# Patient Record
Sex: Male | Born: 1970 | Race: Black or African American | Hispanic: No | Smoking: Former smoker
Health system: Southern US, Community
[De-identification: ages and names within clinical notes are randomized; demographics above are authoritative.]

## PROBLEM LIST (undated history)

## (undated) DIAGNOSIS — I639 Cerebral infarction, unspecified: Secondary | ICD-10-CM

## (undated) DIAGNOSIS — R519 Headache, unspecified: Secondary | ICD-10-CM

## (undated) DIAGNOSIS — I351 Nonrheumatic aortic (valve) insufficiency: Secondary | ICD-10-CM

## (undated) DIAGNOSIS — F419 Anxiety disorder, unspecified: Secondary | ICD-10-CM

## (undated) DIAGNOSIS — I2510 Atherosclerotic heart disease of native coronary artery without angina pectoris: Secondary | ICD-10-CM

## (undated) DIAGNOSIS — F32A Depression, unspecified: Secondary | ICD-10-CM

## (undated) DIAGNOSIS — K219 Gastro-esophageal reflux disease without esophagitis: Secondary | ICD-10-CM

## (undated) DIAGNOSIS — F329 Major depressive disorder, single episode, unspecified: Secondary | ICD-10-CM

## (undated) DIAGNOSIS — Z720 Tobacco use: Secondary | ICD-10-CM

## (undated) DIAGNOSIS — R7303 Prediabetes: Secondary | ICD-10-CM

## (undated) DIAGNOSIS — I219 Acute myocardial infarction, unspecified: Secondary | ICD-10-CM

## (undated) DIAGNOSIS — Z01810 Encounter for preprocedural cardiovascular examination: Secondary | ICD-10-CM

## (undated) DIAGNOSIS — I10 Essential (primary) hypertension: Secondary | ICD-10-CM

## (undated) DIAGNOSIS — R011 Cardiac murmur, unspecified: Secondary | ICD-10-CM

## (undated) HISTORY — DX: Atherosclerotic heart disease of native coronary artery without angina pectoris: I25.10

## (undated) HISTORY — DX: Nonrheumatic aortic (valve) insufficiency: I35.1

## (undated) HISTORY — DX: Encounter for preprocedural cardiovascular examination: Z01.810

---

## 2000-09-29 ENCOUNTER — Encounter: Payer: Self-pay | Admitting: Emergency Medicine

## 2000-09-29 ENCOUNTER — Emergency Department (HOSPITAL_COMMUNITY): Admission: EM | Admit: 2000-09-29 | Discharge: 2000-09-29 | Payer: Self-pay

## 2000-10-01 ENCOUNTER — Encounter: Payer: Self-pay | Admitting: Emergency Medicine

## 2000-10-01 ENCOUNTER — Emergency Department (HOSPITAL_COMMUNITY): Admission: EM | Admit: 2000-10-01 | Discharge: 2000-10-01 | Payer: Self-pay | Admitting: Emergency Medicine

## 2000-12-18 ENCOUNTER — Emergency Department (HOSPITAL_COMMUNITY): Admission: EM | Admit: 2000-12-18 | Discharge: 2000-12-18 | Payer: Self-pay | Admitting: Emergency Medicine

## 2001-07-23 ENCOUNTER — Emergency Department (HOSPITAL_COMMUNITY): Admission: EM | Admit: 2001-07-23 | Discharge: 2001-07-23 | Payer: Self-pay | Admitting: Emergency Medicine

## 2001-07-23 ENCOUNTER — Encounter: Payer: Self-pay | Admitting: Emergency Medicine

## 2002-03-24 ENCOUNTER — Emergency Department (HOSPITAL_COMMUNITY): Admission: EM | Admit: 2002-03-24 | Discharge: 2002-03-24 | Payer: Self-pay | Admitting: Emergency Medicine

## 2003-10-31 ENCOUNTER — Emergency Department (HOSPITAL_COMMUNITY): Admission: EM | Admit: 2003-10-31 | Discharge: 2003-10-31 | Payer: Self-pay | Admitting: Emergency Medicine

## 2007-02-05 ENCOUNTER — Emergency Department (HOSPITAL_COMMUNITY): Admission: EM | Admit: 2007-02-05 | Discharge: 2007-02-05 | Payer: Self-pay | Admitting: Emergency Medicine

## 2007-05-16 ENCOUNTER — Emergency Department (HOSPITAL_COMMUNITY): Admission: EM | Admit: 2007-05-16 | Discharge: 2007-05-16 | Payer: Self-pay | Admitting: Emergency Medicine

## 2008-03-29 ENCOUNTER — Emergency Department (HOSPITAL_COMMUNITY): Admission: EM | Admit: 2008-03-29 | Discharge: 2008-03-30 | Payer: Self-pay | Admitting: Emergency Medicine

## 2008-08-01 ENCOUNTER — Emergency Department (HOSPITAL_COMMUNITY): Admission: EM | Admit: 2008-08-01 | Discharge: 2008-08-01 | Payer: Self-pay | Admitting: Emergency Medicine

## 2008-10-14 ENCOUNTER — Ambulatory Visit: Payer: Self-pay | Admitting: *Deleted

## 2008-10-14 ENCOUNTER — Inpatient Hospital Stay (HOSPITAL_COMMUNITY): Admission: AD | Admit: 2008-10-14 | Discharge: 2008-10-16 | Payer: Self-pay | Admitting: *Deleted

## 2008-10-14 ENCOUNTER — Emergency Department (HOSPITAL_COMMUNITY): Admission: EM | Admit: 2008-10-14 | Discharge: 2008-10-14 | Payer: Self-pay | Admitting: Emergency Medicine

## 2009-04-17 ENCOUNTER — Emergency Department (HOSPITAL_COMMUNITY): Admission: EM | Admit: 2009-04-17 | Discharge: 2009-04-17 | Payer: Self-pay | Admitting: Emergency Medicine

## 2009-04-21 ENCOUNTER — Emergency Department (HOSPITAL_COMMUNITY): Admission: EM | Admit: 2009-04-21 | Discharge: 2009-04-21 | Payer: Self-pay | Admitting: Emergency Medicine

## 2010-04-07 ENCOUNTER — Emergency Department (HOSPITAL_COMMUNITY)
Admission: EM | Admit: 2010-04-07 | Discharge: 2010-04-07 | Disposition: A | Discharge status: Home / Self Care | Payer: Self-pay | Attending: Emergency Medicine | Admitting: Emergency Medicine

## 2010-04-07 DIAGNOSIS — K089 Disorder of teeth and supporting structures, unspecified: Secondary | ICD-10-CM | POA: Insufficient documentation

## 2010-04-07 DIAGNOSIS — K029 Dental caries, unspecified: Secondary | ICD-10-CM | POA: Insufficient documentation

## 2010-04-07 DIAGNOSIS — I10 Essential (primary) hypertension: Secondary | ICD-10-CM | POA: Insufficient documentation

## 2010-06-04 LAB — CBC
HCT: 43.2 % (ref 39.0–52.0)
Hemoglobin: 14.7 g/dL (ref 13.0–17.0)
RBC: 5.05 MIL/uL (ref 4.22–5.81)
WBC: 6.5 10*3/uL (ref 4.0–10.5)

## 2010-06-04 LAB — ETHANOL: Alcohol, Ethyl (B): 108 mg/dL — ABNORMAL HIGH (ref 0–10)

## 2010-06-04 LAB — BASIC METABOLIC PANEL
BUN: 7 mg/dL (ref 6–23)
CO2: 28 mEq/L (ref 19–32)
Calcium: 8.8 mg/dL (ref 8.4–10.5)
Chloride: 106 mEq/L (ref 96–112)
Creatinine, Ser: 0.95 mg/dL (ref 0.4–1.5)
GFR calc Af Amer: >60 mL/min (ref >60)
GFR calc non Af Amer: >60 mL/min (ref >60)
Glucose, Bld: 106 mg/dL — ABNORMAL HIGH (ref 70–99)
Potassium: 3.7 mEq/L (ref 3.5–5.1)
Sodium: 138 mEq/L (ref 135–145)

## 2010-06-04 LAB — DIFFERENTIAL
Eosinophils Relative: 1 % (ref 0–5)
Lymphocytes Relative: 26 % (ref 12–46)
Lymphs Abs: 1.7 10*3/uL (ref 0.7–4.0)
Monocytes Absolute: 0.6 10*3/uL (ref 0.1–1.0)
Monocytes Relative: 9 % (ref 3–12)

## 2010-06-04 LAB — RAPID URINE DRUG SCREEN, HOSP PERFORMED
Amphetamines: NOT DETECTED
Benzodiazepines: NOT DETECTED
Cocaine: NOT DETECTED

## 2010-07-12 NOTE — Discharge Summary (Signed)
NAME:  Darren [name], Darren [name] NO.:  [id number]   MEDICAL RECORD NO.:  [id number]          PATIENT TYPE:  IPS   LOCATION:  0303                          FACILITY:  [facility]   PHYSICIAN:  Jasmine Pang, M.D. DATE OF BIRTH:  11-20-1970   DATE OF ADMISSION:  10/14/2008  DATE OF DISCHARGE:  10/16/2008                               DISCHARGE SUMMARY   IDENTIFYING INFORMATION:  A 40 year old married African American male,  who was admitted on an involuntary basis on August18, 2010.   HISTORY OF PRESENT ILLNESS:  The patient stated he was depressed.  He  got into an argument with his wife.  He was suicidal, but states is not  now.  He states he was not threatening to harm his wife.  He had been  using some alcohol.  He described a history of marital conflict.  This  is the first [facility] admission for the patient, no history of suicide  attempts.  He is not currently seeing any psychiatrist or therapist.  For further admission information, see psychiatric admission assessment.  Upon admission, an Axis I diagnosis of depressive disorder, NOS was  given as well as rule out alcohol abuse.  On Axis III, diagnosis of  hypertension was given.   PHYSICAL FINDINGS:  There were no acute physical or medical problems  noted.  The patient's physical exam was done at the [facility]  and was within normal limits.   DIAGNOSTIC STUDIES:  CBC was within normal limits.  Alcohol level was  108.  Glucose 106.  UDS was negative.   HOSPITAL COURSE:  Upon admission, the patient was started on Ambien 10  mg p.o. q.h.s. p.r.n. insomnia.  He was also started on Librium 25 mg  p.o. q.4 hours p.r.n. withdrawal symptoms.  Initially in sessions, the  patient was disheveled with psychomotor retardation.  Speech was soft  and slow.  Mood was depressed and anxious.  Affect was tearful and  depressed.  There was no suicidal or homicidal ideation.  There was no  evidence of thought disorder or psychosis.   The patient was tearful  primarily because he found out he was not going home that since he  wanted to.  He stated he was not threatening to harm his wife and would  not harm himself.  However, his wife was concerned about his immediate  discharge and we decided to keep him for 1 more day.  On October 16, 2008, the patient's sleep was good and appetite was good.  Psychomotor  activity was within normal limits.  Speech was normal rate and flow.  He  was casually and neatly dressed with good eye contact.  Mood was less  depressed, less anxious.  Affect consistent with mood.  There was no  suicidal or homicidal ideation.  No thoughts of self injurious behavior.  No auditory or visual hallucinations.  No paranoia or delusions.  Thoughts were logical and goal-directed.  Thought content, no  predominant theme.  Cognitive was grossly intact.  Insight good.  Judgment good.  Impulse control good.  The patient was  felt to be safe  for discharge and wanted to go home today.  His wife was comfortable  with his discharge today.  He stated he is going to stay with his father  for a while before he returns to his home.   DISCHARGE DIAGNOSES:  Axis I:  Depressive disorder, not otherwise  specified,  features of alcohol abuse.  Axis II: None.  Axis III:  Hypertension.  Axis IV:  Moderate (problems with primary support group, housing  problem, occupational problem - needs a note for work, so he will not  lose his job).  Axis V:  Global assessment of functioning was 50 at discharge.  Global  assessment of functioning was 35 upon admission.  Global assessment of  functioning highest past year was 60-65.   DISCHARGE/PLAN:  There were no specific activity level or dietary  restrictions.   POSTHOSPITAL CARE PLANS:  The patient will go to [facility] at [place] on  October 20, 2008 at 8 o'clock a.m.   DISCHARGE MEDICATIONS:  None.  He is to restart his hydrochlorothiazide  as prescribed by his doctor when  he returns to see him.      Jasmine Pang, M.D.  Electronically Signed     BHS/MEDQ  D:  10/16/2008  T:  10/17/2008  Job:  [id number]

## 2010-12-05 LAB — URINE MICROSCOPIC-ADD ON

## 2010-12-05 LAB — URINALYSIS, ROUTINE W REFLEX MICROSCOPIC
Glucose, UA: NEGATIVE
Ketones, ur: 15 — AB
Nitrite: NEGATIVE
Specific Gravity, Urine: 1.035 — ABNORMAL HIGH
pH: 6

## 2010-12-05 LAB — CBC
Platelets: 340
RBC: 5.24
WBC: 5.5

## 2010-12-05 LAB — LIPASE, BLOOD: Lipase: 21

## 2010-12-05 LAB — COMPREHENSIVE METABOLIC PANEL
ALT: 22
AST: 24
Albumin: 3.8
CO2: 27
Calcium: 9.3
Chloride: 103
GFR calc Af Amer: >60
GFR calc non Af Amer: >60
Sodium: 138

## 2010-12-05 LAB — DIFFERENTIAL
Eosinophils Absolute: 0.4
Eosinophils Relative: 6 — ABNORMAL HIGH
Lymphocytes Relative: 30
Lymphs Abs: 1.6
Monocytes Absolute: 0.7

## 2014-03-02 ENCOUNTER — Inpatient Hospital Stay (HOSPITAL_COMMUNITY)
Admission: EM | Admit: 2014-03-02 | Discharge: 2014-03-05 | DRG: 247 | Disposition: A | Discharge status: Home / Self Care | Payer: BLUE CROSS/BLUE SHIELD | Attending: Cardiology | Admitting: Cardiology

## 2014-03-02 ENCOUNTER — Encounter (HOSPITAL_COMMUNITY): Payer: Self-pay | Admitting: Emergency Medicine

## 2014-03-02 ENCOUNTER — Emergency Department (HOSPITAL_COMMUNITY): Payer: BLUE CROSS/BLUE SHIELD

## 2014-03-02 DIAGNOSIS — F17210 Nicotine dependence, cigarettes, uncomplicated: Secondary | ICD-10-CM | POA: Diagnosis present

## 2014-03-02 DIAGNOSIS — I10 Essential (primary) hypertension: Secondary | ICD-10-CM

## 2014-03-02 DIAGNOSIS — I25110 Atherosclerotic heart disease of native coronary artery with unstable angina pectoris: Secondary | ICD-10-CM | POA: Diagnosis present

## 2014-03-02 DIAGNOSIS — I119 Hypertensive heart disease without heart failure: Secondary | ICD-10-CM | POA: Diagnosis present

## 2014-03-02 DIAGNOSIS — R42 Dizziness and giddiness: Secondary | ICD-10-CM | POA: Diagnosis present

## 2014-03-02 DIAGNOSIS — R079 Chest pain, unspecified: Secondary | ICD-10-CM

## 2014-03-02 DIAGNOSIS — I214 Non-ST elevation (NSTEMI) myocardial infarction: Principal | ICD-10-CM | POA: Diagnosis present

## 2014-03-02 DIAGNOSIS — Z955 Presence of coronary angioplasty implant and graft: Secondary | ICD-10-CM

## 2014-03-02 DIAGNOSIS — Z8249 Family history of ischemic heart disease and other diseases of the circulatory system: Secondary | ICD-10-CM | POA: Diagnosis not present

## 2014-03-02 DIAGNOSIS — Z720 Tobacco use: Secondary | ICD-10-CM

## 2014-03-02 HISTORY — DX: Tobacco use: Z72.0

## 2014-03-02 HISTORY — DX: Essential (primary) hypertension: I10

## 2014-03-02 LAB — CBC WITH DIFFERENTIAL/PLATELET
BASOS PCT: 0 % (ref 0–1)
Basophils Absolute: 0 10*3/uL (ref 0.0–0.1)
EOS ABS: 0 10*3/uL (ref 0.0–0.7)
Eosinophils Relative: 0 % (ref 0–5)
HEMATOCRIT: 43.3 % (ref 39.0–52.0)
HEMOGLOBIN: 14.4 g/dL (ref 13.0–17.0)
Lymphocytes Relative: 30 % (ref 12–46)
Lymphs Abs: 2.7 10*3/uL (ref 0.7–4.0)
MCH: 29.2 pg (ref 26.0–34.0)
MCHC: 33.3 g/dL (ref 30.0–36.0)
MCV: 87.8 fL (ref 78.0–100.0)
MONO ABS: 0.9 10*3/uL (ref 0.1–1.0)
MONOS PCT: 10 % (ref 3–12)
Neutro Abs: 5.3 10*3/uL (ref 1.7–7.7)
Neutrophils Relative %: 60 % (ref 43–77)
Platelets: 275 10*3/uL (ref 150–400)
RBC: 4.93 MIL/uL (ref 4.22–5.81)
RDW: 15 % (ref 11.5–15.5)
WBC: 9 10*3/uL (ref 4.0–10.5)

## 2014-03-02 LAB — COMPREHENSIVE METABOLIC PANEL
ALBUMIN: 4.2 g/dL (ref 3.5–5.2)
ALK PHOS: 68 U/L (ref 39–117)
ALT: 15 U/L (ref 0–53)
AST: 24 U/L (ref 0–37)
Anion gap: 8 (ref 5–15)
BILIRUBIN TOTAL: 0.4 mg/dL (ref 0.3–1.2)
BUN: 9 mg/dL (ref 6–23)
CHLORIDE: 105 meq/L (ref 96–112)
CO2: 25 mmol/L (ref 19–32)
CREATININE: 1.02 mg/dL (ref 0.50–1.35)
Calcium: 8.9 mg/dL (ref 8.4–10.5)
GFR calc Af Amer: >90 mL/min (ref >90)
GFR calc non Af Amer: 88 mL/min — ABNORMAL LOW (ref >90)
Glucose, Bld: 105 mg/dL — ABNORMAL HIGH (ref 70–99)
POTASSIUM: 3.5 mmol/L (ref 3.5–5.1)
SODIUM: 138 mmol/L (ref 135–145)
Total Protein: 7.5 g/dL (ref 6.0–8.3)

## 2014-03-02 LAB — TROPONIN I: Troponin I: 0.04 ng/mL — ABNORMAL HIGH (ref <0.031)

## 2014-03-02 MED ORDER — ASPIRIN 81 MG PO CHEW
324.0000 mg | CHEWABLE_TABLET | Freq: Once | ORAL | Status: AC
Start: 2014-03-02 — End: 2014-03-02
  Administered 2014-03-02: 324 mg via ORAL
  Filled 2014-03-02: qty 4

## 2014-03-02 MED ORDER — SODIUM CHLORIDE 0.9 % IV SOLN
INTRAVENOUS | Status: DC
  Administered 2014-03-03: 04:00:00 via INTRAVENOUS

## 2014-03-02 MED ORDER — SODIUM CHLORIDE 0.9 % IV SOLN
INTRAVENOUS | Status: DC
  Administered 2014-03-02: 10 mL/h via INTRAVENOUS
  Administered 2014-03-03: 02:00:00 via INTRAVENOUS

## 2014-03-02 NOTE — H&P (Addendum)
Admit date: 03/02/2014 Referring Physician: Dr. Freida Busman Primary Cardiologist: None Chief complaint/reason for admission:Chest pain  HPI: This is a 43yo AAM with a history of HTN but no prior cardiac history who presented to the ER with complaints of chest pain and SOB.  He says that for the past few weeks he has had sharp left sided chest pain associated with SOB and diaphoresis.  Initially with no radiation but over past several days has had numbness and tightness in his left arm.  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  He has continued to have intermittent exertional CP and now has been having rest pain.  He presented to the ER tonight and continues to complain of 3/10 CP with left arm tightness.  His initial troponin is elevated at 0.04.  Cardiology is now asked to evaluate for admission.    PMH:    Past Medical History  Diagnosis Date  . Hypertension     PSH:   History reviewed. No pertinent past surgical history.  ALLERGIES:   Hydrocodone  Prior to Admit Meds:   (Not in a hospital admission) Family HX:    Family History  Problem Relation Age of Onset  . Hypertension Mother   . Hypertension Father   . Hypertension Other   . Diabetes Other   . Heart attack Other    Social HX:    History   Social History  . Marital Status: Legally Separated    Spouse Name: N/A    Number of Children: N/A  . Years of Education: N/A   Occupational History  . Not on file.   Social History Main Topics  . Smoking status: Current Every Day Smoker -- 0.50 packs/day  . Smokeless tobacco: Not on file  . Alcohol Use: Yes     Comment: occ  . Drug Use: No  . Sexual Activity: Not on file   Other Topics Concern  . Not on file   Social History Narrative  . No narrative on file     ROS:  All 11 ROS were addressed and are negative except what is stated in the HPI  PHYSICAL EXAM Filed Vitals:   03/02/14 2145  BP: 196/93  Pulse: 89  Temp:   Resp: 18    General: Well developed, well nourished, in no acute distress Head: Eyes PERRLA, No xanthomas.   Normal cephalic and atramatic  Lungs:   Clear bilaterally to auscultation and percussion. Heart:   HRRR S1 S2 Pulses are 2+ & equal.            No carotid bruit. No JVD.  No abdominal bruits. No femoral bruits. Abdomen: Bowel sounds are positive, abdomen soft and non-tender without masses  Extremities:   No clubbing, cyanosis or edema.  DP +1 Neuro: Alert and oriented X 3. Psych:  Good affect, responds appropriately   Labs:   Lab Results  Component Value Date   WBC 9.0 03/02/2014   HGB 14.4 03/02/2014   HCT 43.3 03/02/2014   MCV 87.8 03/02/2014   PLT 275 03/02/2014    Recent Labs Lab 03/02/14 2107  NA 138  K 3.5  CL 105  CO2 25  BUN 9  CREATININE 1.02  CALCIUM 8.9  PROT 7.5  BILITOT 0.4  ALKPHOS 68  ALT 15  AST 24  GLUCOSE 105*   Lab Results  Component Value Date   TROPONINI 0.04* 03/02/2014   No results found for: PTT No  results found for: INR, PROTIME  No results found for: CHOL No results found for: HDL No results found for: LDLCALC No results found for: TRIG No results found for: CHOLHDL No results found for: LDLDIRECT    Radiology:  No results found.  EKG:  NSR with marked LVH with repolarization abnormality and J point elevation in V1 and V2  ASSESSMENT:  1.  NSTEMI currently with 3/10 pain that is somewhat atypical in that it is sharp but he has been having exertional symptoms for 3 weeks and now rest pain with SOB and diphoresis.  EKG shows marked LVH with repolarization abnormality.   2.  HTN - poorly controlled 3.  Tobacco abuse  PLAN:   1.  Transfer to [facility] and admit to stepdown tele 2.  IV Heparin gtt 3.  IV NTG gtt and titrate for CP 4.  ASA daily 5.  Lipitor  daily 6.  Lopressor  BID - will check urine drug screen to rule out cocaine use (pt denies) before giving 7.  Urine drug screen 8.  NPO after MN 9.  Cardiac cath in am  -Cardiac catheterization was discussed with the patient fully including risks on myocardial infarction, death, stroke, bleeding, arrhythmia, dye allergy, renal insufficiency or bleeding.  All patient questions and concerns were discussed and the patient understands and is willing to proceed.     Quintella Reichert, MD  03/02/2014  11:19 PM

## 2014-03-02 NOTE — ED Notes (Signed)
Pt states he is having chest pain in his left chest he describes as sharp that comes and goes  Pt states it started a couple days ago and when it comes it feels like he cant catch his breath  Pt states he has been under a lot of stress for the past 3 weeks

## 2014-03-02 NOTE — ED Provider Notes (Signed)
CSN: [id number]     Arrival date & time 03/02/14  2049 History   First MD Initiated Contact with Patient 03/02/14 2140     Chief Complaint  Patient presents with  . Chest Pain     (Consider location/radiation/quality/duration/timing/severity/associated sxs/prior Treatment) HPI Comments: Patient here complaining of intermittent left-sided chest pain 3 weeks with radiation to his arm with associated dyspnea. Pain is worse when he fell stress and last for 5-10 minutes. No associated diaphoresis. No syncope or near-syncope. No palpitations. No exertional component to this. No prior history of this. Does have a history of hypertension for which she is not being treated. Has never used any medications for this. Denies any current symptoms at this time. No fever or cough. Nothing makes the symptoms better  Patient is a 44 y.o. male presenting with chest pain. The history is provided by the patient.  Chest Pain   Past Medical History  Diagnosis Date  . Hypertension    History reviewed. No pertinent past surgical history. Family History  Problem Relation Age of Onset  . Hypertension Mother   . Hypertension Father   . Hypertension Other   . Diabetes Other   . Heart attack Other    History  Substance Use Topics  . Smoking status: Current Every Day Smoker  . Smokeless tobacco: Not on file  . Alcohol Use: Yes     Comment: occ    Review of Systems  Cardiovascular: Positive for chest pain.  All other systems reviewed and are negative.     Allergies  Hydrocodone  Home Medications   Prior to Admission medications   Not on File   BP 196/93 mmHg  Pulse 89  Temp(Src) 98.7 F (37.1 C) (Oral)  Resp 18  SpO2 100% Physical Exam  Constitutional: He is oriented to person, place, and time. He appears well-developed and well-nourished.  Non-toxic appearance. No distress.  HENT:  Head: Normocephalic and atraumatic.  Eyes: Conjunctivae, EOM and lids are normal. Pupils are equal,  round, and reactive to light.  Neck: Normal range of motion. Neck supple. No tracheal deviation present. No thyroid mass present.  Cardiovascular: Normal rate, regular rhythm and normal heart sounds.  Exam reveals no gallop.   No murmur heard. Pulmonary/Chest: Effort normal and breath sounds normal. No stridor. No respiratory distress. He has no decreased breath sounds. He has no wheezes. He has no rhonchi. He has no rales.  Abdominal: Soft. Normal appearance and bowel sounds are normal. He exhibits no distension. There is no tenderness. There is no rebound and no CVA tenderness.  Musculoskeletal: Normal range of motion. He exhibits no edema or tenderness.  Neurological: He is alert and oriented to person, place, and time. He has normal strength. No cranial nerve deficit or sensory deficit. GCS eye subscore is 4. GCS verbal subscore is 5. GCS motor subscore is 6.  Skin: Skin is warm and dry. No abrasion and no rash noted.  Psychiatric: He has a normal mood and affect. His speech is normal and behavior is normal.  Nursing note and vitals reviewed.   ED Course  Procedures (including critical care time) Labs Review Labs Reviewed  CBC WITH DIFFERENTIAL  COMPREHENSIVE METABOLIC PANEL  TROPONIN I    Imaging Review Dg Chest 2 View  03/02/2014   CLINICAL DATA:  Initial evaluation left chest pain, sharp, intermittent, for few days, patient smokes, hypertension  EXAM: CHEST  2 VIEW  COMPARISON:  None.  FINDINGS: Mild left-sided cardiac enlargement. Vascular pattern  normal. Lungs clear. No effusion. Bony thorax intact.  IMPRESSION: Cardiac enlargement, otherwise negative   Electronically Signed   By: Esperanza Heir M.D.   On: 03/02/2014 21:33     EKG Interpretation   Date/Time:  Monday March 02 2014 20:56:54 EST Ventricular Rate:  88 PR Interval:  164 QRS Duration: 96 QT Interval:  337 QTC Calculation: 408 R Axis:   85 Text Interpretation:  Sinus rhythm Biatrial enlargement LVH with  secondary  repolarization abnormality ST depr, consider ischemia, inferior leads  Anterior ST elevation, probably due to LVH Baseline wander in lead(s) III  aVF V1 Confirmed by Nayleah Gamel  MD, [place] Base ([id number]) on 03/02/2014 9:01:11 PM      MDM   Final diagnoses:  Chest pain    Patient positive troponin here. Aspirin given on arrival. Cardiology consulted and Dr. Mayford Knife is here and will transfer the patient to come. He is currently chest pain-free    Toy Baker, MD 03/02/14 [phone]

## 2014-03-03 ENCOUNTER — Encounter (HOSPITAL_COMMUNITY): Payer: Self-pay | Admitting: Physician Assistant

## 2014-03-03 DIAGNOSIS — I214 Non-ST elevation (NSTEMI) myocardial infarction: Principal | ICD-10-CM

## 2014-03-03 DIAGNOSIS — I359 Nonrheumatic aortic valve disorder, unspecified: Secondary | ICD-10-CM

## 2014-03-03 LAB — COMPREHENSIVE METABOLIC PANEL
ALBUMIN: 3.8 g/dL (ref 3.5–5.2)
ALT: 14 U/L (ref 0–53)
AST: 24 U/L (ref 0–37)
Alkaline Phosphatase: 72 U/L (ref 39–117)
Anion gap: 9 (ref 5–15)
BILIRUBIN TOTAL: 0.6 mg/dL (ref 0.3–1.2)
BUN: 6 mg/dL (ref 6–23)
CHLORIDE: 102 meq/L (ref 96–112)
CO2: 26 mmol/L (ref 19–32)
Calcium: 8.9 mg/dL (ref 8.4–10.5)
Creatinine, Ser: 1.05 mg/dL (ref 0.50–1.35)
GFR calc Af Amer: >90 mL/min (ref >90)
GFR calc non Af Amer: 85 mL/min — ABNORMAL LOW (ref >90)
Glucose, Bld: 105 mg/dL — ABNORMAL HIGH (ref 70–99)
Potassium: 3.6 mmol/L (ref 3.5–5.1)
SODIUM: 137 mmol/L (ref 135–145)
Total Protein: 7.5 g/dL (ref 6.0–8.3)

## 2014-03-03 LAB — CBC
HCT: 40 % (ref 39.0–52.0)
Hemoglobin: 12.9 g/dL — ABNORMAL LOW (ref 13.0–17.0)
MCH: 28 pg (ref 26.0–34.0)
MCHC: 32.3 g/dL (ref 30.0–36.0)
MCV: 86.8 fL (ref 78.0–100.0)
PLATELETS: 264 10*3/uL (ref 150–400)
RBC: 4.61 MIL/uL (ref 4.22–5.81)
RDW: 15.3 % (ref 11.5–15.5)
WBC: 8.7 10*3/uL (ref 4.0–10.5)

## 2014-03-03 LAB — RAPID URINE DRUG SCREEN, HOSP PERFORMED
AMPHETAMINES: NOT DETECTED
Barbiturates: NOT DETECTED
Benzodiazepines: NOT DETECTED
COCAINE: NOT DETECTED
OPIATES: NOT DETECTED
TETRAHYDROCANNABINOL: NOT DETECTED

## 2014-03-03 LAB — PROTIME-INR
INR: 1.02 (ref 0.00–1.49)
Prothrombin Time: 13.5 seconds (ref 11.6–15.2)

## 2014-03-03 LAB — MAGNESIUM: MAGNESIUM: 2.2 mg/dL (ref 1.5–2.5)

## 2014-03-03 LAB — PLATELET INHIBITION P2Y12: Platelet Function  P2Y12: 214 [PRU] (ref 194–418)

## 2014-03-03 LAB — CBC WITH DIFFERENTIAL/PLATELET
BASOS ABS: 0 10*3/uL (ref 0.0–0.1)
Basophils Relative: 0 % (ref 0–1)
Eosinophils Absolute: 0.1 10*3/uL (ref 0.0–0.7)
Eosinophils Relative: 1 % (ref 0–5)
HCT: 42.1 % (ref 39.0–52.0)
HEMOGLOBIN: 13.9 g/dL (ref 13.0–17.0)
LYMPHS PCT: 28 % (ref 12–46)
Lymphs Abs: 3.3 10*3/uL (ref 0.7–4.0)
MCH: 28.4 pg (ref 26.0–34.0)
MCHC: 33 g/dL (ref 30.0–36.0)
MCV: 86.1 fL (ref 78.0–100.0)
Monocytes Absolute: 1 10*3/uL (ref 0.1–1.0)
Monocytes Relative: 8 % (ref 3–12)
NEUTROS PCT: 63 % (ref 43–77)
Neutro Abs: 7.5 10*3/uL (ref 1.7–7.7)
PLATELETS: 281 10*3/uL (ref 150–400)
RBC: 4.89 MIL/uL (ref 4.22–5.81)
RDW: 15 % (ref 11.5–15.5)
WBC: 11.9 10*3/uL — ABNORMAL HIGH (ref 4.0–10.5)

## 2014-03-03 LAB — TROPONIN I
TROPONIN I: 0.03 ng/mL (ref <0.031)
Troponin I: 0.04 ng/mL — ABNORMAL HIGH (ref <0.031)
Troponin I: <0.03 ng/mL (ref <0.031)

## 2014-03-03 LAB — APTT: aPTT: 34 seconds (ref 24–37)

## 2014-03-03 LAB — TSH: TSH: 0.957 u[IU]/mL (ref 0.350–4.500)

## 2014-03-03 LAB — HEMOGLOBIN A1C
HEMOGLOBIN A1C: 5.9 % — AB (ref <5.7)
Mean Plasma Glucose: 123 mg/dL — ABNORMAL HIGH (ref <117)

## 2014-03-03 LAB — BRAIN NATRIURETIC PEPTIDE: B Natriuretic Peptide: 13.4 pg/mL (ref 0.0–100.0)

## 2014-03-03 LAB — MRSA PCR SCREENING: MRSA BY PCR: NEGATIVE

## 2014-03-03 LAB — HEPARIN LEVEL (UNFRACTIONATED)
HEPARIN UNFRACTIONATED: 0.45 [IU]/mL (ref 0.30–0.70)
Heparin Unfractionated: 0.18 IU/mL — ABNORMAL LOW (ref 0.30–0.70)

## 2014-03-03 MED ORDER — NITROGLYCERIN 0.4 MG SL SUBL: 0.4000 mg | SUBLINGUAL_TABLET | SUBLINGUAL | Status: DC | PRN

## 2014-03-03 MED ORDER — CLONIDINE HCL 0.1 MG PO TABS
0.1000 mg | ORAL_TABLET | Freq: Once | ORAL | Status: AC
  Administered 2014-03-03: 02:00:00 0.1 mg via ORAL
  Filled 2014-03-03: qty 1

## 2014-03-03 MED ORDER — HEPARIN (PORCINE) IN NACL 100-0.45 UNIT/ML-% IJ SOLN
1000.0000 [IU]/h | INTRAMUSCULAR | Status: DC
  Administered 2014-03-03: 03:00:00 800 [IU]/h via INTRAVENOUS
  Administered 2014-03-03: 22:00:00 1000 [IU]/h via INTRAVENOUS
  Filled 2014-03-03 (×3): qty 250

## 2014-03-03 MED ORDER — SODIUM CHLORIDE 0.9 % IJ SOLN
3.0000 mL | Freq: Two times a day (BID) | INTRAMUSCULAR | Status: DC
Start: 2014-03-03 — End: 2014-03-03

## 2014-03-03 MED ORDER — SODIUM CHLORIDE 0.9 % IJ SOLN: 3.0000 mL | INTRAMUSCULAR | Status: DC | PRN

## 2014-03-03 MED ORDER — ONDANSETRON HCL 4 MG/2ML IJ SOLN: 4.0000 mg | Freq: Four times a day (QID) | INTRAMUSCULAR | Status: DC | PRN

## 2014-03-03 MED ORDER — SODIUM CHLORIDE 0.9 % IV SOLN
1.0000 mL/kg/h | INTRAVENOUS | Status: DC
  Administered 2014-03-04: 1 mL/kg/h via INTRAVENOUS

## 2014-03-03 MED ORDER — ASPIRIN 81 MG PO CHEW
81.0000 mg | CHEWABLE_TABLET | ORAL | Status: AC
  Administered 2014-03-04: 81 mg via ORAL

## 2014-03-03 MED ORDER — ASPIRIN 81 MG PO CHEW
81.0000 mg | CHEWABLE_TABLET | ORAL | Status: DC
  Filled 2014-03-03: qty 1

## 2014-03-03 MED ORDER — ASPIRIN 81 MG PO CHEW: 324.0000 mg | CHEWABLE_TABLET | ORAL | Status: AC

## 2014-03-03 MED ORDER — HEPARIN BOLUS VIA INFUSION
4000.0000 [IU] | Freq: Once | INTRAVENOUS | Status: AC
  Administered 2014-03-03: 4000 [IU] via INTRAVENOUS
  Filled 2014-03-03: qty 4000

## 2014-03-03 MED ORDER — ACETAMINOPHEN 325 MG PO TABS
650.0000 mg | ORAL_TABLET | ORAL | Status: DC | PRN
  Administered 2014-03-03 – 2014-03-04 (×3): 650 mg via ORAL
  Filled 2014-03-03 (×3): qty 2

## 2014-03-03 MED ORDER — NITROGLYCERIN IN D5W 200-5 MCG/ML-% IV SOLN
0.0000 ug/min | INTRAVENOUS | Status: DC
  Administered 2014-03-03: 02:00:00 30 ug/min via INTRAVENOUS

## 2014-03-03 MED ORDER — SODIUM CHLORIDE 0.9 % IJ SOLN
3.0000 mL | Freq: Two times a day (BID) | INTRAMUSCULAR | Status: DC
  Administered 2014-03-03 – 2014-03-04 (×3): 3 mL via INTRAVENOUS

## 2014-03-03 MED ORDER — ATORVASTATIN CALCIUM 80 MG PO TABS
80.0000 mg | ORAL_TABLET | Freq: Every day | ORAL | Status: DC
  Administered 2014-03-03 – 2014-03-04 (×3): 80 mg via ORAL
  Filled 2014-03-03 (×4): qty 1

## 2014-03-03 MED ORDER — SODIUM CHLORIDE 0.9 % IV SOLN: 250.0000 mL | INTRAVENOUS | Status: DC | PRN

## 2014-03-03 MED ORDER — ASPIRIN 300 MG RE SUPP: 300.0000 mg | RECTAL | Status: AC

## 2014-03-03 MED ORDER — ASPIRIN EC 81 MG PO TBEC
81.0000 mg | DELAYED_RELEASE_TABLET | Freq: Every day | ORAL | Status: DC
Start: 2014-03-03 — End: 2014-03-05
  Administered 2014-03-03 – 2014-03-05 (×2): 81 mg via ORAL
  Filled 2014-03-03 (×3): qty 1

## 2014-03-03 MED ORDER — HEPARIN BOLUS VIA INFUSION
2000.0000 [IU] | Freq: Once | INTRAVENOUS | Status: AC
  Administered 2014-03-03: 14:00:00 2000 [IU] via INTRAVENOUS
  Filled 2014-03-03: qty 2000

## 2014-03-03 MED ORDER — NITROGLYCERIN IN D5W 200-5 MCG/ML-% IV SOLN
INTRAVENOUS | Status: AC
  Filled 2014-03-03: qty 250

## 2014-03-03 MED ORDER — METOPROLOL TARTRATE 25 MG PO TABS
25.0000 mg | ORAL_TABLET | Freq: Two times a day (BID) | ORAL | Status: DC
  Administered 2014-03-03 – 2014-03-05 (×6): 25 mg via ORAL
  Filled 2014-03-03 (×8): qty 1

## 2014-03-03 NOTE — Progress Notes (Signed)
Notified MD that patient arrived from [facility] to here with elevated SBP 200 and some chest pressure no drips have been started at this time. MD order Ntg drip and clondine x1. Heparin is to start also. RN will continue to monitor and prepare for cath per order. Stable on monitor at this time.

## 2014-03-03 NOTE — Care Management Note (Addendum)
    Page 1 of 2   03/05/2014     11:02:46 AM CARE MANAGEMENT NOTE 03/05/2014  Patient:  Paulsen,Dao A   Account Number:  [id number]  Date Initiated:  03/03/2014  Documentation initiated by:  Jeraldin Fesler  Subjective/Objective Assessment:   CP     Action/Plan:   CM to follow for disposition needs.   Anticipated DC Date:  03/05/2014   Anticipated DC Plan:  HOME/SELF CARE  In-house referral  NA      DC Planning Services  CM consult  Medication Assistance  PCP issues  Follow-up appt scheduled      PAC Choice  NA   Choice offered to / List presented to:             Status of service:  Completed, signed off Medicare Important Message given?  NO (If response is "NO", the following Medicare IM given date fields will be blank) Date Medicare IM given:   Medicare IM given by:   Date Additional Medicare IM given:   Additional Medicare IM given by:    Discharge Disposition:  HOME/SELF CARE  Per UR Regulation:  Reviewed for med. necessity/level of care/duration of stay  If discussed at Long Length of Stay Meetings, dates discussed:    Comments:  Albirtha Grinage RN, BSN, MSHL, CCM  Nurse - Case Manager,  ([facility] [place]) [phone]  03/05/2014 PCP:  None  patient states he needs a PCP and no longer sees Dr. Aretta NipSasser/[facility]. [place], [place] [facility]  [facility].  Primary Care follow-up appointment   March 12, 2014, Thursday 10:00 with Lynford CitizenNoel Redmon [facility] [address], [address] [place] [address] Phone: [phone] Fax: [phone] Hours: Open Monday through Friday from 8:30 am to 5:00 pm  [place] Benefits check in progress CM provided [place] free month card and co-pay card (patient insured with [employer])      Maxene Byington RN, BSN, MSHL, CCM  Nurse - Case Manager,  (Unit [facility]) [phone]  03/03/2014 NSTMI - IV NTG. CM will continue to follow for disposition  needs.

## 2014-03-03 NOTE — Progress Notes (Signed)
Patient: Darren [name] / Admit Date: 03/02/2014 / Date of Encounter: 03/03/2014, 9:56 AM   Subjective: Feeling better. Had twinges of chest pain overnight but currently feels well without CP or SOB. He endorses history of occasional dizziness upon standing. No syncope, bleeding, or history of TIA/CVA. No meds prior admission. Strong family hx of CAD in multiple relatives.   Objective: Telemetry: NSR Physical Exam: Blood pressure 103/63, pulse 70, temperature 98.3 F (36.8 C), temperature source Oral, resp. rate 18, height  (1.702 m), weight 153 lb (69.4 kg), SpO2 100 %. General: Well developed, well nourished AAM in no acute distress. Head: Normocephalic, atraumatic, sclera non-icteric, no xanthomas, nares are without discharge. Neck: Negative for carotid bruits. JVP not elevated. Lungs: Clear bilaterally to auscultation without wheezes, rales, or rhonchi. Breathing is unlabored. Heart: RRR S1 S2 with soft SEM RUSB. No rubs or gallops.  Abdomen: Soft, non-tender, non-distended with normoactive bowel sounds. No rebound/guarding. Extremities: No clubbing or cyanosis. No edema. Distal pedal pulses are 2+ and equal bilaterally. Neuro: Alert and oriented X 3. Moves all extremities spontaneously. Psych:  Responds to questions appropriately with a normal affect.   Intake/Output Summary (Last 24 hours) at 03/03/14 0956 Last data filed at 03/03/14 0800  Gross per 24 hour  Intake 547.62 ml  Output      0 ml  Net 547.62 ml    Inpatient Medications:  . aspirin  324 mg Oral NOW   Or  . aspirin  300 mg Rectal NOW  . [START ON 03/04/2014] aspirin  81 mg Oral Pre-Cath  . aspirin EC  81 mg Oral Daily  . atorvastatin  80 mg Oral q1800  . metoprolol tartrate  25 mg Oral BID  . sodium chloride  3 mL Intravenous Q12H  . sodium chloride  3 mL Intravenous Q12H   Infusions:  . sodium chloride Stopped (03/03/14 0400)  . sodium chloride 50 mL/hr at 03/03/14 0800  . heparin 800 Units/hr  (03/03/14 0800)  . nitroGLYCERIN 20 mcg/min (03/03/14 0800)    Labs:  Recent Labs  03/02/14 2107 03/03/14 0142  NA 138 137  K 3.5 3.6  CL 105 102  CO2 25 26  GLUCOSE 105* 105*  BUN 9 6  CREATININE 1.02 1.05  CALCIUM 8.9 8.9  MG  --  2.2    Recent Labs  03/02/14 2107 03/03/14 0142  AST 24 24  ALT 15 14  ALKPHOS 68 72  BILITOT 0.4 0.6  PROT 7.5 7.5  ALBUMIN 4.2 3.8    Recent Labs  03/02/14 2107 03/03/14 0142 03/03/14 0655  WBC 9.0 11.9* 8.7  NEUTROABS 5.3 7.5  --   HGB 14.4 13.9 12.9*  HCT 43.3 42.1 40.0  MCV 87.8 86.1 86.8  PLT 275 281 264    Recent Labs  03/02/14 2107 03/03/14 0142 03/03/14 0655  TROPONINI 0.04* 0.04* 0.03   Invalid input(s): POCBNP No results for input(s): HGBA1C in the last 72 hours.   Radiology/Studies:  Dg Chest 2 View  03/02/2014   CLINICAL DATA:  Initial evaluation left chest pain, sharp, intermittent, for few days, patient smokes, hypertension  EXAM: CHEST  2 VIEW  COMPARISON:  None.  FINDINGS: Mild left-sided cardiac enlargement. Vascular pattern normal. Lungs clear. No effusion. Bony thorax intact.  IMPRESSION: Cardiac enlargement, otherwise negative   Electronically Signed   By: Esperanza Heir M.D.   On: 03/02/2014 21:33     Assessment and Plan  1. NSTEMI - quality of pain is somewhat  atypical but he has had exertional symptoms as well as minimally elevated troponin. There are 20 patients ahead of him on the cath board. Given that he is currently pain free and troponin has normalized, will continue medical therapy today and plan cardiac cath in AM. Patient instructed to notify nursing if pain returns at which time we would need to reassess timing of cath. Check lipids in AM. Continue aspirin, BB, statin, IV NTG. Risks and benefits of cardiac catheterization have been discussed with the patient.  These include bleeding, infection, kidney damage, stroke, heart attack, death. The patient understands these risks and is willing to  proceed. 2. Accelerated HTN - continue metoprolol and NTG gtt for now. Check 2D echo given murmur and LVH on EKG. 3. Tobacco abuse and rare marijuana use - counseled regarding cessation. UDS neg. 4. H/o orthostasis-type symptoms (dizziness upon standing) - monitor with med addition. Follow on tele.  Signed, Ronie Spiesayna Dunn PA-C  As above, patient seen and examined. He is pain-free at present. Per Dr. Mayford Knifeurner plan cardiac catheterization. Schedule allows today and so we'll proceed tomorrow. The risks and benefits were discussed and he agrees to proceed. Continue aspirin, heparin, nitroglycerin, metoprolol and statin. Blood pressure is much improved. Plan echocardiogram to assess LV function and left ventricular hypertrophy (electrocardiogram shows significant left ventricular hypertrophy with repolarization abnormality). Patient counseled on the importance of discontinuing tobacco use. Olga MillersBrian Cobi Delph

## 2014-03-03 NOTE — Progress Notes (Signed)
ANTICOAGULATION CONSULT NOTE - Initial Consult  Pharmacy Consult for Heparin  Indication: chest pain/ACS  Allergies  Allergen Reactions  . Hydrocodone Hives    Patient Measurements: Height: 5\' 7"  (170.2 cm) Weight: 153 lb (69.4 kg) IBW/kg (Calculated) : 66.1 Heparin Dosing Weight: 69 kg   Vital Signs: Temp: 98.4 F (36.9 C) (01/05 1300) Temp Source: Oral (01/05 1300) BP: 126/67 mmHg (01/05 1300) Pulse Rate: 64 (01/05 1300)  Labs:  Recent Labs  03/02/14 2107 03/03/14 0142 03/03/14 0655 03/03/14 0859  HGB 14.4 13.9 12.9*  --   HCT 43.3 42.1 40.0  --   PLT 275 281 264  --   APTT  --  34  --   --   LABPROT  --  13.5  --   --   INR  --  1.02  --   --   HEPARINUNFRC  --   --   --  0.18*  CREATININE 1.02 1.05  --   --   TROPONINI 0.04* 0.04* 0.03  --     Estimated Creatinine Clearance: 83.9 mL/min (by C-G formula based on Cr of 1.05).   Medical History: Past Medical History  Diagnosis Date  . Hypertension   . Tobacco abuse     Medications:  No prescriptions prior to admission    Assessment: [age] YOM presented to the ED with chest pain and SOB. Pharmacy consulted to start heparin infusion for ACS. Initial troponin is elevated at 0.04. H/H and plt wnl. Cath planned tomorrow AM.   Initial HL is SUBtherapeutic at 0.18 on heparin 800 units/hr. No bleeding noted and there have been no lapse in heparin infusion.  Goal of Therapy:  Heparin level 0.3-0.7 units/ml Monitor platelets by anticoagulation protocol: Yes   Plan:  -Heparin 2000 units/hr bolus followed by heparin infusion at 1000 units/hr  -F/u 6 hour HL -Monitor daily CBC, HL and s/s of bleeding.   Arlean Hoppingorey M. Newman PiesBall, PharmD Clinical Pharmacist Pager [phone]

## 2014-03-03 NOTE — Progress Notes (Signed)
  Echocardiogram 2D Echocardiogram has been performed.  Darren [name] 03/03/2014, 3:30 PM

## 2014-03-03 NOTE — Progress Notes (Signed)
UR completed Itai Barbian K. Lasaro Primm, RN, BSN, MSHL, CCM  03/03/2014 4:03 PM

## 2014-03-03 NOTE — Progress Notes (Signed)
ANTICOAGULATION CONSULT NOTE - Initial Consult  Pharmacy Consult for Heparin  Indication: chest pain/ACS  Allergies  Allergen Reactions  . Hydrocodone Hives    Patient Measurements: Height: 5\' 7"  (170.2 cm) Weight: 153 lb (69.4 kg) IBW/kg (Calculated) : 66.1 Heparin Dosing Weight: 69 kg   Vital Signs: Temp: 98.2 F (36.8 C) (01/05 0137) Temp Source: Oral (01/05 0137) BP: 209/78 mmHg (01/05 0137) Pulse Rate: 65 (01/05 0137)  Labs:  Recent Labs  03/02/14 2107 03/03/14 0142  HGB 14.4 13.9  HCT 43.3 42.1  PLT 275 281  CREATININE 1.02  --   TROPONINI 0.04*  --     Estimated Creatinine Clearance: 86.4 mL/min (by C-G formula based on Cr of 1.02).   Medical History: Past Medical History  Diagnosis Date  . Hypertension     Medications:  No prescriptions prior to admission    Assessment: [age] YOM presented to the ED with chest pain and SOB. Pharmacy consulted to start heparin infusion for ACS. Initial troponin is elevated at 0.04. H/H and plt wnl. Cath planned in the AM.   Goal of Therapy:  Heparin level 0.3-0.7 units/ml Monitor platelets by anticoagulation protocol: Yes   Plan:  -Heparin 4000 units/hr bolus followed by heparin infusion at 800 units/hr  -F/u 6 hour HL -Monitor daily CBC, HL and s/s of bleeding.   Vinnie LevelBenjamin Rosalee Tolley, PharmD., BCPS Clinical Pharmacist Pager [phone]

## 2014-03-03 NOTE — Progress Notes (Signed)
ANTICOAGULATION CONSULT NOTE - Follow Up Consult  Pharmacy Consult for Heparin Indication: chest pain/ACS  Allergies  Allergen Reactions  . Hydrocodone Hives    Patient Measurements: Height: 5\' 7"  (170.2 cm) Weight: 153 lb (69.4 kg) IBW/kg (Calculated) : 66.1 Heparin Dosing Weight: 69 kg  Vital Signs: Temp: 98.4 F (36.9 C) (01/05 1600) Temp Source: Oral (01/05 1600) BP: 123/62 mmHg (01/05 1600) Pulse Rate: 62 (01/05 1600)  Labs:  Recent Labs  03/02/14 2107 03/03/14 0142 03/03/14 0655 03/03/14 0859 03/03/14 1314 03/03/14 1939  HGB 14.4 13.9 12.9*  --   --   --   HCT 43.3 42.1 40.0  --   --   --   PLT 275 281 264  --   --   --   APTT  --  34  --   --   --   --   LABPROT  --  13.5  --   --   --   --   INR  --  1.02  --   --   --   --   HEPARINUNFRC  --   --   --  0.18*  --  0.45  CREATININE 1.02 1.05  --   --   --   --   TROPONINI 0.04* 0.04* 0.03  --  <0.03  --     Estimated Creatinine Clearance: 83.9 mL/min (by C-G formula based on Cr of 1.05).   Medications:  Scheduled:  . aspirin  324 mg Oral NOW   Or  . aspirin  300 mg Rectal NOW  . [START ON 03/04/2014] aspirin  81 mg Oral Pre-Cath  . aspirin EC  81 mg Oral Daily  . atorvastatin  80 mg Oral q1800  . metoprolol tartrate  25 mg Oral BID  . sodium chloride  3 mL Intravenous Q12H   Infusions:  . sodium chloride Stopped (03/03/14 0400)  . [START ON 03/04/2014] sodium chloride    . heparin 1,000 Units/hr (03/03/14 1347)  . nitroGLYCERIN 15 mcg/min (03/03/14 1700)    Assessment: 44 yo M admitted with CP and SOB, plans for cardiac cath tomorrow.  Heparin level now therapeutic on 1000 units/hr.  No bleeding noted.  Goal of Therapy:  Heparin level 0.3-0.7 units/ml Monitor platelets by anticoagulation protocol: Yes   Plan:  Continue heparin at 1000 units/hr Heparin level and CBC daily while on heparin  Toys 'R' UsKimberly Adeja Sarratt, Pharm.D., BCPS Clinical Pharmacist Pager [phone] 03/03/2014 8:10 PM

## 2014-03-04 ENCOUNTER — Encounter (HOSPITAL_COMMUNITY)
Admission: EM | Disposition: A | Discharge status: Home / Self Care | Payer: Self-pay | Source: Home / Self Care | Attending: Cardiology

## 2014-03-04 ENCOUNTER — Encounter (HOSPITAL_COMMUNITY): Payer: Self-pay | Admitting: Cardiovascular Disease

## 2014-03-04 DIAGNOSIS — I10 Essential (primary) hypertension: Secondary | ICD-10-CM

## 2014-03-04 DIAGNOSIS — I25110 Atherosclerotic heart disease of native coronary artery with unstable angina pectoris: Secondary | ICD-10-CM

## 2014-03-04 DIAGNOSIS — Z720 Tobacco use: Secondary | ICD-10-CM

## 2014-03-04 HISTORY — PX: CARDIAC CATHETERIZATION: SHX172

## 2014-03-04 HISTORY — DX: Essential (primary) hypertension: I10

## 2014-03-04 HISTORY — PX: LEFT HEART CATHETERIZATION WITH CORONARY ANGIOGRAM: SHX5451

## 2014-03-04 LAB — BASIC METABOLIC PANEL
ANION GAP: 7 (ref 5–15)
BUN: 10 mg/dL (ref 6–23)
CHLORIDE: 106 meq/L (ref 96–112)
CO2: 23 mmol/L (ref 19–32)
CREATININE: 0.97 mg/dL (ref 0.50–1.35)
Calcium: 8.4 mg/dL (ref 8.4–10.5)
GFR calc non Af Amer: >90 mL/min (ref >90)
Glucose, Bld: 105 mg/dL — ABNORMAL HIGH (ref 70–99)
Potassium: 3.7 mmol/L (ref 3.5–5.1)
SODIUM: 136 mmol/L (ref 135–145)

## 2014-03-04 LAB — POCT ACTIVATED CLOTTING TIME: Activated Clotting Time: 282 seconds

## 2014-03-04 LAB — CBC
HCT: 38.3 % — ABNORMAL LOW (ref 39.0–52.0)
Hemoglobin: 12.6 g/dL — ABNORMAL LOW (ref 13.0–17.0)
MCH: 29.4 pg (ref 26.0–34.0)
MCHC: 32.9 g/dL (ref 30.0–36.0)
MCV: 89.5 fL (ref 78.0–100.0)
PLATELETS: 248 10*3/uL (ref 150–400)
RBC: 4.28 MIL/uL (ref 4.22–5.81)
RDW: 15.4 % (ref 11.5–15.5)
WBC: 8.2 10*3/uL (ref 4.0–10.5)

## 2014-03-04 LAB — LIPID PANEL
CHOL/HDL RATIO: 3.5 ratio
CHOLESTEROL: 152 mg/dL (ref 0–200)
HDL: 44 mg/dL (ref >39)
LDL Cholesterol: 88 mg/dL (ref 0–99)
Triglycerides: 100 mg/dL (ref <150)
VLDL: 20 mg/dL (ref 0–40)

## 2014-03-04 LAB — HEPARIN LEVEL (UNFRACTIONATED): HEPARIN UNFRACTIONATED: 0.35 [IU]/mL (ref 0.30–0.70)

## 2014-03-04 SURGERY — LEFT HEART CATHETERIZATION WITH CORONARY ANGIOGRAM
Anesthesia: Moderate Sedation | Laterality: Bilateral

## 2014-03-04 MED ORDER — NITROGLYCERIN 1 MG/10 ML FOR IR/CATH LAB
INTRA_ARTERIAL | Status: AC
  Filled 2014-03-04: qty 10

## 2014-03-04 MED ORDER — MIDAZOLAM HCL 2 MG/2ML IJ SOLN
INTRAMUSCULAR | Status: AC
  Filled 2014-03-04: qty 2

## 2014-03-04 MED ORDER — TICAGRELOR 90 MG PO TABS
ORAL_TABLET | ORAL | Status: AC
  Filled 2014-03-04: qty 1

## 2014-03-04 MED ORDER — FENTANYL CITRATE 0.05 MG/ML IJ SOLN
INTRAMUSCULAR | Status: AC
  Filled 2014-03-04: qty 2

## 2014-03-04 MED ORDER — HEART ATTACK BOUNCING BOOK
Freq: Once | Status: AC
  Administered 2014-03-05: 04:00:00
  Filled 2014-03-04: qty 1

## 2014-03-04 MED ORDER — SODIUM CHLORIDE 0.9 % IV SOLN: INTRAVENOUS | Status: AC

## 2014-03-04 MED ORDER — VERAPAMIL HCL 2.5 MG/ML IV SOLN
INTRAVENOUS | Status: AC
  Filled 2014-03-04: qty 2

## 2014-03-04 MED ORDER — TICAGRELOR 90 MG PO TABS
90.0000 mg | ORAL_TABLET | Freq: Two times a day (BID) | ORAL | Status: DC
  Administered 2014-03-04 – 2014-03-05 (×2): 90 mg via ORAL
  Filled 2014-03-04 (×3): qty 1

## 2014-03-04 MED ORDER — HEPARIN SODIUM (PORCINE) 1000 UNIT/ML IJ SOLN
INTRAMUSCULAR | Status: AC
  Filled 2014-03-04: qty 1

## 2014-03-04 MED ORDER — ACTIVE PARTNERSHIP FOR HEALTH OF YOUR HEART BOOK
Freq: Once | Status: AC
  Administered 2014-03-04: 23:00:00
  Filled 2014-03-04: qty 1

## 2014-03-04 MED ORDER — LIDOCAINE HCL (PF) 1 % IJ SOLN
INTRAMUSCULAR | Status: AC
  Filled 2014-03-04: qty 30

## 2014-03-04 MED ORDER — HEPARIN (PORCINE) IN NACL 2-0.9 UNIT/ML-% IJ SOLN
INTRAMUSCULAR | Status: AC
  Filled 2014-03-04: qty 1000

## 2014-03-04 NOTE — CV Procedure (Signed)
Cardiac Catheterization Operative Report  Janeece AgeeCharles A Dukes [id number] 1/6/20169:21 AM No primary care provider on file.  Procedure Performed:  1. Left Heart Catheterization 2. Selective Coronary Angiography 3. Left ventricular angiogram 4. PTCA/DES x 1 mid LAD  Operator: Verne Carrowhristopher McAlhany, MD  Arterial access site:  Right radial artery.   Indication: 44 yo male with history of tobacco abuse admitted with unstable angina, NSTEMI.                                      Procedure Details: The risks, benefits, complications, treatment options, and expected outcomes were discussed with the patient. The patient and/or family concurred with the proposed plan, giving informed consent. The patient was brought to the cath lab after IV hydration was begun and oral premedication was given. The patient was further sedated with Versed and Fentanyl. The right wrist was assessed with a modified Allens test which was positive. The right wrist was prepped and draped in a sterile fashion. 1% lidocaine was used for local anesthesia. Using the modified Seldinger access technique, a 5 French sheath was placed in the right radial artery. 3 mg Verapamil was given through the sheath. 4000 units IV heparin was given. Standard diagnostic catheters were used to perform selective coronary angiography. A pigtail catheter was used to perform a left ventricular angiogram. He was found to have a moderate, hazy stenosis in the mid LAD with dye staining suggesting a ruptured plaque. I elected to proceed to PCI of the LAD.   PCI Note: He was given an additional 6000 units of IV heparin. He was given Brilinta 180 mg po x 1. I then engaged the left main with a XB LAD 3.5 guiding catheter. When the ACT was over 200, I passed a Cougar IC wire down the LAD. A 2.5 x 12 mm balloon was used to pre-dilate the stenosis. I then deployed a 3.5 x 16 mm Promus Premier DES in the mid LAD. The stent was post-dilated with a 3.75 x 12 mm  [place] balloon x 1. The stenosis was taken from 60% down to 0%. As above, this was a ruptured plaque with haziness and dye staining suggesting instability and was thus appropriate for treatment with a stent.   The sheath was removed from the right radial artery and a Terumo hemostasis band was applied at the arteriotomy site on the right wrist.    There were no immediate complications. The patient was taken to the recovery area in stable condition.   Hemodynamic Findings: Central aortic pressure: 109/60 Left ventricular pressure: 114/2/10  Angiographic Findings:  Left main: No obstructive disease.   Left Anterior Descending Artery: Large caliber vessel that courses to the apex. The mid LAD has a hazy 60% stenosis with contrast dye staining on the distal edge, c/w a ruptured plaque. (The caudal views demonstrate the haziness and the cranial views best demonstrate the dye staining). The diagonal is moderate in caliber and patent with no obstructive disease.   Circumflex Artery: Large caliber vessel with large obtuse marginal branch and several small posterolateral branches. No obstructive disease.   Right Coronary Artery: Moderate caliber co-dominant vessel with no obstructive disease.   Left Ventricular Angiogram: LVEF=50-55%.   Impression: 1. Severe single vessel CAD 2. Normal LV systolic function 3. NSTEMI secondary to ulcerated plaque mid LAD 4. Successful PTCA/DES x 1 mid LAD  Recommendations: He will need  dual anti-platelet therapy with ASA and Brilinta for at least one year. Continue beta blocker and statin. Smoking cessation.        Complications:  None. The patient tolerated the procedure well.

## 2014-03-04 NOTE — Progress Notes (Signed)
Dr. Clifton JamesMcAlhany paged regarding patient's swollen right arm below cath site.  R radial cath site level zero with no bleeding.  TR band removed and dressing applied.  Dr. Clifton JamesMcAlhany observed at patient's bedside and suggested compression to be held to right forearm with compression dressing to follow.  No other complications noted.  Will continue to assess.    Keitha ButteSMITH, Katara Griner A, RN

## 2014-03-04 NOTE — Progress Notes (Signed)
     SUBJECTIVE: Mild SOB when walking around the room. No chest pain this am.   BP 115/73 mmHg  Pulse 60  Temp(Src) 98 F (36.7 C) (Oral)  Resp 20  Ht 5' 7" (1.702 m)  Wt 153 lb 14.1 oz (69.8 kg)  BMI 24.10 kg/m2  SpO2 100%  Intake/Output Summary (Last 24 hours) at 03/04/14 0727 Last data filed at 03/04/14 0700  Gross per 24 hour  Intake 1120.69 ml  Output    900 ml  Net 220.69 ml    PHYSICAL EXAM General: Well developed, well nourished, in no acute distress. Alert and oriented x 3.  Psych:  Good affect, responds appropriately Extremities: Right radial pulse is patent  LABS: Basic Metabolic Panel:  Recent Labs  03/03/14 0142 03/04/14 0316  NA 137 136  K 3.6 3.7  CL 102 106  CO2 26 23  GLUCOSE 105* 105*  BUN 6 10  CREATININE 1.05 0.97  CALCIUM 8.9 8.4  MG 2.2  --    CBC:  Recent Labs  03/02/14 2107 03/03/14 0142 03/03/14 0655 03/04/14 0316  WBC 9.0 11.9* 8.7 8.2  NEUTROABS 5.3 7.5  --   --   HGB 14.4 13.9 12.9* 12.6*  HCT 43.3 42.1 40.0 38.3*  MCV 87.8 86.1 86.8 89.5  PLT 275 281 264 248   Cardiac Enzymes:  Recent Labs  03/03/14 0142 03/03/14 0655 03/03/14 1314  TROPONINI 0.04* 0.03 <0.03   Fasting Lipid Panel:  Recent Labs  03/04/14 0316  CHOL 152  HDL 44  LDLCALC 88  TRIG 100  CHOLHDL 3.5    Current Meds: . aspirin  81 mg Oral Pre-Cath  . aspirin EC  81 mg Oral Daily  . atorvastatin  80 mg Oral q1800  . metoprolol tartrate  25 mg Oral BID  . sodium chloride  3 mL Intravenous Q12H    ASSESSMENT AND PLAN:  1. NSTEMI: Pt admitted with chest pain c/w unstable angina. Very mild troponin elevated at 0.04 but then negative. Plans for cardiac cath today. Risks and benefits of cath reviewed with patient and he agrees to proceed with the cardiac cath.   Jaryiah Mehlman  1/6/20167:27 AM  

## 2014-03-04 NOTE — Progress Notes (Signed)
Called to see patient with swelling in right arm from cardiac cath this morning.  He did have significant swelling in the arm from the site of the arterial puncture to just below the elbow.  I held manual pressure on the site for 20 minutes then placed an ace bandage on the site.  Dr. Clifton JamesMcAlhany also came up to look at the site.  Instructions given about keeping the arm elevated and to let the RN know if the swelling got larger or pain got worse.

## 2014-03-04 NOTE — Interval H&P Note (Signed)
History and Physical Interval Note:  03/04/2014 8:27 AM  Janeece Ageeharles A Tillis  has presented today for cardiac cath with the diagnosis of nstemi  The various methods of treatment have been discussed with the patient and family. After consideration of risks, benefits and other options for treatment, the patient has consented to  Procedure(s): LEFT HEART CATHETERIZATION WITH CORONARY ANGIOGRAM (Bilateral) as a surgical intervention .  The patient's history has been reviewed, patient examined, no change in status, stable for surgery.  I have reviewed the patient's chart and labs.  Questions were answered to the patient's satisfaction.    Cath Lab Visit (complete for each Cath Lab visit)  Clinical Evaluation Leading to the Procedure:   ACS: Yes.    Non-ACS:    Anginal Classification: CCS IV  Anti-ischemic medical therapy: No Therapy  Non-Invasive Test Results: No non-invasive testing performed  Prior CABG: No previous CABG         Dreana Britz

## 2014-03-04 NOTE — H&P (View-Only) (Signed)
     SUBJECTIVE: Mild SOB when walking around the room. No chest pain this am.   BP 115/73 mmHg  Pulse 60  Temp(Src) 98 F (36.7 C) (Oral)  Resp 20  Ht 5\' 7"  (1.702 m)  Wt 153 lb 14.1 oz (69.8 kg)  BMI 24.10 kg/m2  SpO2 100%  Intake/Output Summary (Last 24 hours) at 03/04/14 [id number] Last data filed at 03/04/14 0700  Gross per 24 hour  Intake 1120.69 ml  Output    900 ml  Net 220.69 ml    PHYSICAL EXAM General: Well developed, well nourished, in no acute distress. Alert and oriented x 3.  Psych:  Good affect, responds appropriately Extremities: Right radial pulse is patent  LABS: Basic Metabolic Panel:  Recent Labs  19/14/7799/07/12 0142 03/04/14 0316  NA 137 136  K 3.6 3.7  CL 102 106  CO2 26 23  GLUCOSE 105* 105*  BUN 6 10  CREATININE 1.05 0.97  CALCIUM 8.9 8.4  MG 2.2  --    CBC:  Recent Labs  03/02/14 2107 03/03/14 0142 03/03/14 0655 03/04/14 0316  WBC 9.0 11.9* 8.7 8.2  NEUTROABS 5.3 7.5  --   --   HGB 14.4 13.9 12.9* 12.6*  HCT 43.3 42.1 40.0 38.3*  MCV 87.8 86.1 86.8 89.5  PLT 275 281 264 248   Cardiac Enzymes:  Recent Labs  03/03/14 0142 03/03/14 0655 03/03/14 1314  TROPONINI 0.04* 0.03 <0.03   Fasting Lipid Panel:  Recent Labs  03/04/14 0316  CHOL 152  HDL 44  LDLCALC 88  TRIG 100  CHOLHDL 3.5    Current Meds: . aspirin  81 mg Oral Pre-Cath  . aspirin EC  81 mg Oral Daily  . atorvastatin  80 mg Oral q1800  . metoprolol tartrate  25 mg Oral BID  . sodium chloride  3 mL Intravenous Q12H    ASSESSMENT AND PLAN:  1. NSTEMI: Pt admitted with chest pain c/w unstable angina. Very mild troponin elevated at 0.04 but then negative. Plans for cardiac cath today. Risks and benefits of cath reviewed with patient and he agrees to proceed with the cardiac cath.   MCALHANY,CHRISTOPHER  1/6/20167:27 AM

## 2014-03-05 DIAGNOSIS — I119 Hypertensive heart disease without heart failure: Secondary | ICD-10-CM

## 2014-03-05 DIAGNOSIS — Z720 Tobacco use: Secondary | ICD-10-CM

## 2014-03-05 LAB — CBC
HCT: 39.7 % (ref 39.0–52.0)
Hemoglobin: 13 g/dL (ref 13.0–17.0)
MCH: 29.1 pg (ref 26.0–34.0)
MCHC: 32.7 g/dL (ref 30.0–36.0)
MCV: 89 fL (ref 78.0–100.0)
Platelets: 244 10*3/uL (ref 150–400)
RBC: 4.46 MIL/uL (ref 4.22–5.81)
RDW: 15 % (ref 11.5–15.5)
WBC: 7.6 10*3/uL (ref 4.0–10.5)

## 2014-03-05 LAB — BASIC METABOLIC PANEL
Anion gap: 3 — ABNORMAL LOW (ref 5–15)
BUN: 7 mg/dL (ref 6–23)
CHLORIDE: 108 meq/L (ref 96–112)
CO2: 26 mmol/L (ref 19–32)
Calcium: 8.4 mg/dL (ref 8.4–10.5)
Creatinine, Ser: 0.91 mg/dL (ref 0.50–1.35)
GFR calc Af Amer: >90 mL/min (ref >90)
GFR calc non Af Amer: >90 mL/min (ref >90)
Glucose, Bld: 96 mg/dL (ref 70–99)
POTASSIUM: 3.7 mmol/L (ref 3.5–5.1)
Sodium: 137 mmol/L (ref 135–145)

## 2014-03-05 MED ORDER — TICAGRELOR 90 MG PO TABS: 90.0000 mg | ORAL_TABLET | Freq: Two times a day (BID) | ORAL | Status: DC

## 2014-03-05 MED ORDER — ASPIRIN 81 MG PO TBEC: 81.0000 mg | DELAYED_RELEASE_TABLET | Freq: Every day | ORAL | Status: DC

## 2014-03-05 MED ORDER — METOPROLOL TARTRATE 25 MG PO TABS: 25.0000 mg | ORAL_TABLET | Freq: Two times a day (BID) | ORAL | Status: DC

## 2014-03-05 MED ORDER — HYDROCHLOROTHIAZIDE 12.5 MG PO CAPS: 12.5000 mg | ORAL_CAPSULE | Freq: Every day | ORAL | Status: DC

## 2014-03-05 MED ORDER — HYDROCHLOROTHIAZIDE 12.5 MG PO CAPS
12.5000 mg | ORAL_CAPSULE | Freq: Every day | ORAL | Status: DC
  Administered 2014-03-05: 10:00:00 12.5 mg via ORAL
  Filled 2014-03-05: qty 1

## 2014-03-05 MED ORDER — NITROGLYCERIN 0.4 MG SL SUBL: 0.4000 mg | SUBLINGUAL_TABLET | SUBLINGUAL | Status: DC | PRN

## 2014-03-05 MED ORDER — ATORVASTATIN CALCIUM 80 MG PO TABS: 80.0000 mg | ORAL_TABLET | Freq: Every day | ORAL | Status: DC

## 2014-03-05 NOTE — Discharge Summary (Signed)
Physician Discharge Summary    Cardiologist: Turner Patient ID: Darren AgeeCharles A [name] MRN: [id number] DOB/AGE: Jul 18, 1970 44 y.o.  Admit date: 03/02/2014 Discharge date: 03/05/2014  Admission Diagnoses: NSTEMI  Discharge Diagnoses:  Active Problems:   NSTEMI (non-ST elevated myocardial infarction)   Accelerated hypertension   Hypertensive heart disease   Tobacco abuse   Discharged Condition: stable  Hospital Course:   44yo AAM with a history of HTN but no prior cardiac history who presented to the ER with complaints of chest pain and SOB. He says that for the past few weeks he has had sharp left sided chest pain associated with SOB and diaphoresis. Initially with no radiation but over past several days has had numbness and tightness in his left arm. 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 He has continued to have intermittent exertional CP and now has been having rest pain. He presented to the ER tonight and continues to complain of 3/10 CP with left arm tightness. His initial troponin is elevated at 0.04.   The patient was admitted and started on IV heparin, NTG, statin, ASA, lopressor.  Echo revealed LVEF 60-65%, severe concentric LVH, normal wall motion, diastolicdysfunction, normal LV filling pressure, moderate AI, trivial MR.  He underwent left heart cath revealing severe single vessel CAD. Normal LV systolic function. Ulcerated plaque mid LAD treated successfully with PTCA/DES x 1. ASA, Brilinta, lopressor 25 bid, lipitor 80. EF 60-65%, G1DD, moderate AI, severe concentric LVH. BMET pending. BP not well controlled. Restart HCTZ 12.5mg .  The patient was noted to have significant swelling post cath in the right arm.  Pressure was held for 20 mins with improvement the following day.  The patient was seen by Dr. Katrinka BlazingSmith who felt she was stable for DC home.   Consults: None  Significant Diagnostic Studies:  Echo Study Conclusions - Left ventricle: The  cavity size was normal. There was severe concentric hypertrophy. Systolic function was normal. The estimated ejection fraction was in the range of 60% to 65%. Wall motion was normal; there were no regional wall motion abnormalities. Doppler parameters are consistent with abnormal left ventricular relaxation (grade 1 diastolic dysfunction). The E/e&' ratio is <8, suggesting normal LV filling pressure. - Aortic valve: Trileaflet. Sclerosis without stenosis. There was moderate regurgitation. - Mitral valve: Mildly thickened leaflets . There was trivial regurgitation. - Left atrium: The atrium was normal in size. - Right atrium: The atrium was at the upper limits of normal in size. - Inferior vena cava: The vessel was normal in size. The respirophasic diameter changes were in the normal range (>= 50%), consistent with normal central venous pressure.  Impressions:  - LVEF 60-65%, severe concentric LVH, normal wall motion, diastolic dysfunction, normal LV filling pressure, moderate AI, trivial MR.  Cardiac Catheterization Operative Report  Darren AgeeCharles A Buxbaum [id number] 1/6/20169:21 AM No primary care provider on file.  Procedure Performed:  1. Left Heart Catheterization 2. Selective Coronary Angiography 3. Left ventricular angiogram 4. PTCA/DES x 1 mid LAD  Operator: Darren Carrowhristopher McAlhany, MD  Arterial access site: Right radial artery.   Indication: 44 yo male with history of tobacco abuse admitted with unstable angina, NSTEMI.   Procedure Details: The risks, benefits, complications, treatment options, and expected outcomes were discussed with the patient. The patient and/or family concurred with the proposed plan, giving informed consent. The patient was brought to the cath lab after IV hydration was begun and oral premedication was given. The patient was further sedated with  Versed and Fentanyl. The right wrist was  assessed with a modified Allens test which was positive. The right wrist was prepped and draped in a sterile fashion. 1% lidocaine was used for local anesthesia. Using the modified Seldinger access technique, a 5 French sheath was placed in the right radial artery. 3 mg Verapamil was given through the sheath. 4000 units IV heparin was given. Standard diagnostic catheters were used to perform selective coronary angiography. A pigtail catheter was used to perform a left ventricular angiogram. He was found to have a moderate, hazy stenosis in the mid LAD with dye staining suggesting a ruptured plaque. I elected to proceed to PCI of the LAD.   PCI Note: He was given an additional 6000 units of IV heparin. He was given Brilinta 180 mg po x 1. I then engaged the left main with a XB LAD 3.5 guiding catheter. When the ACT was over 200, I passed a Cougar IC wire down the LAD. A 2.5 x 12 mm balloon was used to pre-dilate the stenosis. I then deployed a 3.5 x 16 mm Promus Premier DES in the mid LAD. The stent was post-dilated with a 3.75 x 12 mm [place] balloon x 1. The stenosis was taken from 60% down to 0%. As above, this was a ruptured plaque with haziness and dye staining suggesting instability and was thus appropriate for treatment with a stent.   The sheath was removed from the right radial artery and a Terumo hemostasis band was applied at the arteriotomy site on the right wrist.   There were no immediate complications. The patient was taken to the recovery area in stable condition.   Hemodynamic Findings: Central aortic pressure: 109/60 Left ventricular pressure: 114/2/10  Angiographic Findings:  Left main: No obstructive disease.   Left Anterior Descending Artery: Large caliber vessel that courses to the apex. The mid LAD has a hazy 60% stenosis with contrast dye staining on the distal edge, c/w a ruptured plaque. (The caudal views demonstrate the haziness and the cranial views best demonstrate the dye  staining). The diagonal is moderate in caliber and patent with no obstructive disease.   Circumflex Artery: Large caliber vessel with large obtuse marginal branch and several small posterolateral branches. No obstructive disease.   Right Coronary Artery: Moderate caliber co-dominant vessel with no obstructive disease.   Left Ventricular Angiogram: LVEF=50-55%.   Impression: 1. Severe single vessel CAD 2. Normal LV systolic function 3. NSTEMI secondary to ulcerated plaque mid LAD 4. Successful PTCA/DES x 1 mid LAD  Recommendations: He will need dual anti-platelet therapy with ASA and Brilinta for at least one year. Continue beta blocker and statin. Smoking cessation.    Complications: None. The patient tolerated the procedure well.  Treatments: See above  Discharge Exam: Blood pressure 147/76, pulse 61, temperature 97.5 F (36.4 C), temperature source Oral, resp. rate 20, height  (1.702 m), weight 155 lb 6.8 oz (70.5 kg), SpO2 100 %.   Disposition: 01-Home or Self Care      Discharge Instructions    Diet - low sodium heart healthy    Complete by:  As directed      Discharge instructions    Complete by:  As directed   No lifting with right arm for three days.     Increase activity slowly    Complete by:  As directed             Medication List    TAKE these medications  aspirin 81 MG EC tablet  Take 1 tablet (81 mg total) by mouth daily.     atorvastatin 80 MG tablet  Commonly known as:  LIPITOR  Take 1 tablet (80 mg total) by mouth daily at 6 PM.     hydrochlorothiazide 12.5 MG capsule  Commonly known as:  MICROZIDE  Take 1 capsule (12.5 mg total) by mouth daily.     metoprolol tartrate 25 MG tablet  Commonly known as:  LOPRESSOR  Take 1 tablet (25 mg total) by mouth 2 (two) times daily.     nitroGLYCERIN 0.4 MG SL tablet  Commonly known as:  NITROSTAT  Place 1 tablet (0.4 mg total) under the tongue every 5 (five) minutes x 3 doses  as needed for chest pain.     ticagrelor 90 MG Tabs tablet  Commonly known as:  BRILINTA  Take 1 tablet (90 mg total) by mouth 2 (two) times daily.       Follow-up Information    Follow up with Norma Fredrickson, NP On 03/25/2014.   Specialty:  Nurse Practitioner   Why:  1:30PM   Contact information:   [address] [place] [address] [phone]      Greater than 30 minutes was spent completing the patient's discharge.    SignedWilburt Finlay, PAC 03/05/2014, 8:54 AM

## 2014-03-05 NOTE — Progress Notes (Signed)
Subjective: Right arm feels better  Objective: Vital signs in last 24 hours: Temp:  [97.5 F (36.4 C)-98.3 F (36.8 C)] 97.5 F (36.4 C) (01/07 0606) Pulse Rate:  [61-81] 61 (01/07 0606) Resp:  [18-20] 20 (01/07 0606) BP: (133-177)/(57-139) 147/76 mmHg (01/07 0606) SpO2:  [98 %-100 %] 100 % (01/07 0606) Weight:  [155 lb 6.8 oz (70.5 kg)] 155 lb 6.8 oz (70.5 kg) (01/07 0100) Last BM Date: 03/03/13  Intake/Output from previous day: 01/06 0701 - 01/07 0700 In: 1151.2 [P.O.:600; I.V.:551.2] Out: 1100 [Urine:1100] Intake/Output this shift: Total I/O In: -  Out: 400 [Urine:400]  Medications Current Facility-Administered Medications  Medication Dose Route Frequency Provider Last Rate Last Dose  . 0.9 %  sodium chloride infusion  250 mL Intravenous PRN Quintella Reichertraci R Turner, MD      . acetaminophen (TYLENOL) tablet 650 mg  650 mg Oral Q4H PRN Quintella Reichertraci R Turner, MD   650 mg at 03/04/14 1714  . aspirin EC tablet 81 mg  81 mg Oral Daily Quintella Reichertraci R Turner, MD   81 mg at 03/03/14 0911  . atorvastatin (LIPITOR) tablet 80 mg  80 mg Oral q1800 Quintella Reichertraci R Turner, MD   80 mg at 03/04/14 1714  . metoprolol tartrate (LOPRESSOR) tablet 25 mg  25 mg Oral BID Quintella Reichertraci R Turner, MD   25 mg at 03/04/14 2108  . nitroGLYCERIN (NITROSTAT) SL tablet 0.4 mg  0.4 mg Sublingual Q5 Min x 3 PRN Quintella Reichertraci R Turner, MD      . nitroGLYCERIN 50 mg in dextrose 5 % 250 mL (0.2 mg/mL) infusion  0-200 mcg/min Intravenous Titrated Dolores Pattyaniel R Bensimhon, MD   Stopped at 03/04/14 1000  . ondansetron (ZOFRAN) injection 4 mg  4 mg Intravenous Q6H PRN Quintella Reichertraci R Turner, MD      . sodium chloride 0.9 % injection 3 mL  3 mL Intravenous Q12H Quintella Reichertraci R Turner, MD   3 mL at 03/04/14 2108  . sodium chloride 0.9 % injection 3 mL  3 mL Intravenous PRN Quintella Reichertraci R Turner, MD      . ticagrelor (BRILINTA) tablet 90 mg  90 mg Oral BID Kathleene Hazelhristopher D McAlhany, MD   90 mg at 03/04/14 2109    PE: General appearance: alert, cooperative and no distress Lungs: clear  to auscultation bilaterally Heart: regular rate and rhythm and 1/6 sys MM Extremities: No LEE Pulses: 2+ and symmetric Skin: Warm and dry.  Right wrist stable Neurologic: Grossly normal  Lab Results:   Recent Labs  03/03/14 0655 03/04/14 0316 03/05/14 0530  WBC 8.7 8.2 7.6  HGB 12.9* 12.6* 13.0  HCT 40.0 38.3* 39.7  PLT 264 248 244   BMET  Recent Labs  03/02/14 2107 03/03/14 0142 03/04/14 0316  NA 138 137 136  K 3.5 3.6 3.7  CL 105 102 106  CO2 25 26 23   GLUCOSE 105* 105* 105*  BUN 9 6 10   CREATININE 1.02 1.05 0.97  CALCIUM 8.9 8.9 8.4   PT/INR  Recent Labs  03/03/14 0142  LABPROT 13.5  INR 1.02   Cholesterol  Recent Labs  03/04/14 0316  CHOL 152   Lipid Panel     Component Value Date/Time   CHOL 152 03/04/2014 0316   TRIG 100 03/04/2014 0316   HDL 44 03/04/2014 0316   CHOLHDL 3.5 03/04/2014 0316   VLDL 20 03/04/2014 0316   LDLCALC 88 03/04/2014 0316       Assessment/Plan  43yo AAM with a history of  HTN but no prior cardiac history who presented to the ER with complaints of chest pain and SOB  Active Problems:   NSTEMI (non-ST elevated myocardial infarction)   Accelerated hypertension   Hypertensive heart disease   Tobacco abuse  Plan: SP LHC revealing severe single vessel CAD. Normal LV systolic function.  Ulcerated plaque mid LAD treated successfully with PTCA/DES x 1.  ASA, Brilinta, lopressor 25 bid, lipitor 80.   EF 60-65%, G1DD, moderate AI, severe concentric LVH.  BMET pending.  BP not well controlled.  Restart HCTZ 12.5mg .      LOS: 3 days    Tatem Holsonback PA-C 03/05/2014 6:56 AM

## 2014-03-05 NOTE — Progress Notes (Signed)
CARDIAC REHAB PHASE I   PRE:  Rate/Rhythm: 82 SR  BP:  Supine: 145/75  Sitting:   Standing:    SaO2:   MODE:  Ambulation: 1000 ft   POST:  Rate/Rhythm: 72 SR  BP:  Supine:   Sitting: 183/97 Rechecked 171/76  Standing:    SaO2:  0910-1025 Pt tolerated ambulation well without c/o of cp or SOB. BP after walk 183/97, had pt to rest and rechecked 171/76. Completed MI and stent education with pt. He voices understanding. We discussed smoking cessation. Pt wants to quit "cold [place]." I gave pt tips for quitting, quit smart class information and coaching contact number. Pt seems very committed to making life style changes.  Melina CopaLisa Geremiah Fussell RN 03/05/2014 10:32 AM

## 2014-03-19 ENCOUNTER — Telehealth (HOSPITAL_COMMUNITY): Payer: Self-pay | Admitting: *Deleted

## 2014-03-19 NOTE — Telephone Encounter (Signed)
Received signed order from Dr. Mayford Knifeurner.  Called to assess readiness to attend cardiac rehab.  Message left to please contact cardiac rehab. Alanson Alyarlette Carlton RN, BSN

## 2014-03-24 DIAGNOSIS — I2510 Atherosclerotic heart disease of native coronary artery without angina pectoris: Secondary | ICD-10-CM

## 2014-03-24 HISTORY — DX: Atherosclerotic heart disease of native coronary artery without angina pectoris: I25.10

## 2014-03-25 ENCOUNTER — Encounter: Payer: Self-pay | Admitting: Nurse Practitioner

## 2014-03-25 ENCOUNTER — Observation Stay (HOSPITAL_COMMUNITY)
Admission: AD | Admit: 2014-03-25 | Discharge: 2014-03-26 | Disposition: A | Discharge status: Home / Self Care | Payer: BLUE CROSS/BLUE SHIELD | Source: Ambulatory Visit | Attending: Cardiology | Admitting: Cardiology

## 2014-03-25 ENCOUNTER — Other Ambulatory Visit: Payer: Self-pay

## 2014-03-25 ENCOUNTER — Ambulatory Visit (INDEPENDENT_AMBULATORY_CARE_PROVIDER_SITE_OTHER): Payer: BLUE CROSS/BLUE SHIELD | Admitting: Nurse Practitioner

## 2014-03-25 ENCOUNTER — Encounter (HOSPITAL_COMMUNITY): Payer: Self-pay | Admitting: General Practice

## 2014-03-25 ENCOUNTER — Other Ambulatory Visit: Payer: Self-pay | Admitting: Nurse Practitioner

## 2014-03-25 ENCOUNTER — Observation Stay (HOSPITAL_COMMUNITY): Payer: BLUE CROSS/BLUE SHIELD

## 2014-03-25 VITALS — BP 154/86 | HR 59 | Ht 67.0 in | Wt 154.1 lb

## 2014-03-25 DIAGNOSIS — E785 Hyperlipidemia, unspecified: Secondary | ICD-10-CM | POA: Diagnosis not present

## 2014-03-25 DIAGNOSIS — I119 Hypertensive heart disease without heart failure: Secondary | ICD-10-CM | POA: Insufficient documentation

## 2014-03-25 DIAGNOSIS — Z7982 Long term (current) use of aspirin: Secondary | ICD-10-CM | POA: Insufficient documentation

## 2014-03-25 DIAGNOSIS — Z7902 Long term (current) use of antithrombotics/antiplatelets: Secondary | ICD-10-CM | POA: Diagnosis not present

## 2014-03-25 DIAGNOSIS — I200 Unstable angina: Secondary | ICD-10-CM

## 2014-03-25 DIAGNOSIS — I2510 Atherosclerotic heart disease of native coronary artery without angina pectoris: Secondary | ICD-10-CM | POA: Insufficient documentation

## 2014-03-25 DIAGNOSIS — Z955 Presence of coronary angioplasty implant and graft: Secondary | ICD-10-CM

## 2014-03-25 DIAGNOSIS — I10 Essential (primary) hypertension: Secondary | ICD-10-CM | POA: Diagnosis present

## 2014-03-25 DIAGNOSIS — R0600 Dyspnea, unspecified: Secondary | ICD-10-CM | POA: Diagnosis not present

## 2014-03-25 DIAGNOSIS — R079 Chest pain, unspecified: Secondary | ICD-10-CM

## 2014-03-25 DIAGNOSIS — I252 Old myocardial infarction: Secondary | ICD-10-CM | POA: Diagnosis not present

## 2014-03-25 DIAGNOSIS — R0789 Other chest pain: Secondary | ICD-10-CM

## 2014-03-25 DIAGNOSIS — I2583 Coronary atherosclerosis due to lipid rich plaque: Secondary | ICD-10-CM | POA: Insufficient documentation

## 2014-03-25 DIAGNOSIS — Z720 Tobacco use: Secondary | ICD-10-CM | POA: Insufficient documentation

## 2014-03-25 HISTORY — DX: Atherosclerotic heart disease of native coronary artery without angina pectoris: I25.10

## 2014-03-25 LAB — PROTIME-INR
INR: 1.11 (ref 0.00–1.49)
Prothrombin Time: 14.4 seconds (ref 11.6–15.2)

## 2014-03-25 LAB — COMPREHENSIVE METABOLIC PANEL
ALT: 22 U/L (ref 0–53)
AST: 26 U/L (ref 0–37)
Albumin: 3.7 g/dL (ref 3.5–5.2)
Alkaline Phosphatase: 80 U/L (ref 39–117)
Anion gap: 9 (ref 5–15)
BUN: 10 mg/dL (ref 6–23)
CO2: 23 mmol/L (ref 19–32)
Calcium: 8.7 mg/dL (ref 8.4–10.5)
Chloride: 104 mmol/L (ref 96–112)
Creatinine, Ser: 1.06 mg/dL (ref 0.50–1.35)
GFR calc Af Amer: >90 mL/min (ref >90)
GFR calc non Af Amer: 84 mL/min — ABNORMAL LOW (ref >90)
Glucose, Bld: 90 mg/dL (ref 70–99)
Potassium: 3.5 mmol/L (ref 3.5–5.1)
Sodium: 136 mmol/L (ref 135–145)
Total Bilirubin: 0.6 mg/dL (ref 0.3–1.2)
Total Protein: 6.6 g/dL (ref 6.0–8.3)

## 2014-03-25 LAB — CBC WITH DIFFERENTIAL/PLATELET
Basophils Absolute: 0 10*3/uL (ref 0.0–0.1)
Basophils Relative: 0 % (ref 0–1)
Eosinophils Absolute: 0.1 10*3/uL (ref 0.0–0.7)
Eosinophils Relative: 1 % (ref 0–5)
HCT: 42.3 % (ref 39.0–52.0)
Hemoglobin: 14.1 g/dL (ref 13.0–17.0)
Lymphocytes Relative: 29 % (ref 12–46)
Lymphs Abs: 2.1 10*3/uL (ref 0.7–4.0)
MCH: 28.5 pg (ref 26.0–34.0)
MCHC: 33.3 g/dL (ref 30.0–36.0)
MCV: 85.6 fL (ref 78.0–100.0)
Monocytes Absolute: 0.6 10*3/uL (ref 0.1–1.0)
Monocytes Relative: 8 % (ref 3–12)
Neutro Abs: 4.5 10*3/uL (ref 1.7–7.7)
Neutrophils Relative %: 62 % (ref 43–77)
Platelets: 266 10*3/uL (ref 150–400)
RBC: 4.94 MIL/uL (ref 4.22–5.81)
RDW: 14.3 % (ref 11.5–15.5)
WBC: 7.4 10*3/uL (ref 4.0–10.5)

## 2014-03-25 LAB — TROPONIN I
Troponin I: <0.03 ng/mL (ref <0.031)
Troponin I: <0.03 ng/mL (ref <0.031)

## 2014-03-25 LAB — MAGNESIUM: Magnesium: 2 mg/dL (ref 1.5–2.5)

## 2014-03-25 LAB — APTT: aPTT: 35 seconds (ref 24–37)

## 2014-03-25 MED ORDER — SODIUM CHLORIDE 0.9 % IV SOLN: 250.0000 mL | INTRAVENOUS | Status: DC | PRN

## 2014-03-25 MED ORDER — ASPIRIN 81 MG PO CHEW
324.0000 mg | CHEWABLE_TABLET | ORAL | Status: AC
  Administered 2014-03-25: 324 mg via ORAL
  Filled 2014-03-25: qty 4

## 2014-03-25 MED ORDER — SODIUM CHLORIDE 0.9 % IJ SOLN: 3.0000 mL | INTRAMUSCULAR | Status: DC | PRN

## 2014-03-25 MED ORDER — METOPROLOL TARTRATE 25 MG PO TABS
25.0000 mg | ORAL_TABLET | Freq: Two times a day (BID) | ORAL | Status: DC
  Administered 2014-03-25 – 2014-03-26 (×2): 25 mg via ORAL
  Filled 2014-03-25 (×2): qty 1

## 2014-03-25 MED ORDER — ATORVASTATIN CALCIUM 80 MG PO TABS
80.0000 mg | ORAL_TABLET | Freq: Every day | ORAL | Status: DC
  Administered 2014-03-25: 80 mg via ORAL
  Filled 2014-03-25: qty 1

## 2014-03-25 MED ORDER — SODIUM CHLORIDE 0.9 % IV SOLN: INTRAVENOUS | Status: DC

## 2014-03-25 MED ORDER — DIAZEPAM 5 MG PO TABS
10.0000 mg | ORAL_TABLET | ORAL | Status: AC
  Administered 2014-03-26: 10 mg via ORAL
  Filled 2014-03-25: qty 2

## 2014-03-25 MED ORDER — ONDANSETRON HCL 4 MG/2ML IJ SOLN: 4.0000 mg | Freq: Four times a day (QID) | INTRAMUSCULAR | Status: DC | PRN

## 2014-03-25 MED ORDER — NITROGLYCERIN 0.4 MG SL SUBL
SUBLINGUAL_TABLET | SUBLINGUAL | Status: AC
  Filled 2014-03-25: qty 1

## 2014-03-25 MED ORDER — ASPIRIN 300 MG RE SUPP: 300.0000 mg | RECTAL | Status: AC

## 2014-03-25 MED ORDER — ASPIRIN 81 MG PO CHEW
81.0000 mg | CHEWABLE_TABLET | ORAL | Status: AC
  Administered 2014-03-26: 81 mg via ORAL
  Filled 2014-03-25: qty 1

## 2014-03-25 MED ORDER — HYDROCHLOROTHIAZIDE 12.5 MG PO CAPS: 12.5000 mg | ORAL_CAPSULE | Freq: Every day | ORAL | Status: DC

## 2014-03-25 MED ORDER — TICAGRELOR 90 MG PO TABS
90.0000 mg | ORAL_TABLET | Freq: Two times a day (BID) | ORAL | Status: DC
Start: 2014-03-25 — End: 2014-03-26
  Administered 2014-03-25 – 2014-03-26 (×2): 90 mg via ORAL
  Filled 2014-03-25 (×2): qty 1

## 2014-03-25 MED ORDER — ACETAMINOPHEN 325 MG PO TABS: 650.0000 mg | ORAL_TABLET | ORAL | Status: DC | PRN

## 2014-03-25 MED ORDER — ASPIRIN EC 81 MG PO TBEC: 81.0000 mg | DELAYED_RELEASE_TABLET | Freq: Every day | ORAL | Status: DC

## 2014-03-25 MED ORDER — ENOXAPARIN SODIUM 80 MG/0.8ML ~~LOC~~ SOLN
1.0000 mg/kg | Freq: Two times a day (BID) | SUBCUTANEOUS | Status: DC
  Administered 2014-03-25: 70 mg via SUBCUTANEOUS
  Filled 2014-03-25: qty 0.8

## 2014-03-25 MED ORDER — NITROGLYCERIN 0.4 MG SL SUBL
0.4000 mg | SUBLINGUAL_TABLET | SUBLINGUAL | Status: DC | PRN
  Administered 2014-03-25 (×2): 0.4 mg via SUBLINGUAL

## 2014-03-25 MED ORDER — SODIUM CHLORIDE 0.9 % IJ SOLN
3.0000 mL | Freq: Two times a day (BID) | INTRAMUSCULAR | Status: DC
  Administered 2014-03-25 (×2): 3 mL via INTRAVENOUS

## 2014-03-25 NOTE — Patient Instructions (Signed)
We are going to admit you to the hospital.  

## 2014-03-25 NOTE — H&P (Signed)
Darren [name]  03/25/2014 1:30 PM  Office Visit  MRN:  [id number]   Description: Male DOB: 10-08-1970  Provider: Rosalio Macadamia, NP  Department: [facility] [address] [facility]       Vital Signs  Most recent update: 03/25/2014 1:41 PM by Theressa Stamps, RN    BP Pulse Ht Wt BMI SpO2    154/86 mmHg 59  (1.702 m) 154 lb 1.9 oz (69.908 kg) 24.13 kg/m2 99%      Progress Notes      Rosalio Macadamia, NP at 03/25/2014 1:29 PM     Status: Signed       Expand All Collapse All       CARDIOLOGY OFFICE NOTE  Date: 03/25/2014    Darren [name] Date of Birth: 11-Jul-1970 Medical Record #[id number]  PCP: No primary care provider on file. Cardiologist: Mayford Knife   Chief Complaint  Patient presents with  . Coronary Artery Disease    Post hospital with PCI to the LAD - seen for Dr. Mayford Knife.      History of Present Illness: Darren [name] is a 44 y.o. male who presents today for a post hospital visit - s/p PCI. He is seen for Dr. Mayford Knife. He has a history of HTN but no prior cardiac history.   Presented to the ER earlier this month with chest pain and dyspnea. Elevated troponin. Was cathed and had ulcerated plaque in the mid LAD that was treated with PCI/DES x 1. Echo revealed LVEF 60-65%, severe concentric LVH, normal wall motion, diastolicdysfunction, normal LV filling pressure, moderate AI, trivial MR. Discharged on DAPT with Brilinta and aspirin. Discharged on 03/05/2014. Peak troponin was 0.4.   Comes back today. Here alone. He has been home about 3 weeks. Initially did well with no chest pain. He has had chronic shortness of breath. He did well until the snow came. 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 He would have chest discomfort - relieved with rest but had to take NTG last night.for pain that was a "3" on scale of 0 to 10. He did not go to work yesterday - too fatigued - spent the day in bed which is  unusual for him - The NTG did help. Coming in here, he has some recurrence - not as bad but relieved with NTG. Rates this a "1". He has been compliant with his medicines. He is not smoking. He notes that this discomfort is very similar to what he presented with - just not as severe.    Past Medical History  Diagnosis Date  . Hypertension   . Tobacco abuse     Past Surgical History  Procedure Laterality Date  . Left heart catheterization with coronary angiogram Bilateral 03/04/2014    Procedure: LEFT HEART CATHETERIZATION WITH CORONARY ANGIOGRAM; Surgeon: Kathleene Hazel, MD; Location: [facility] CATH LAB; Service: Cardiovascular; Laterality: Bilateral;     Medications: Current Outpatient Prescriptions  Medication Sig Dispense Refill  . aspirin EC 81 MG EC tablet Take 1 tablet (81 mg total) by mouth daily.    Marland Kitchen atorvastatin (LIPITOR) 80 MG tablet Take 1 tablet (80 mg total) by mouth daily at 6 PM. 30 tablet 5  . hydrochlorothiazide (MICROZIDE) 12.5 MG capsule Take 1 capsule (12.5 mg total) by mouth daily. 30 capsule 5  . metoprolol tartrate (LOPRESSOR) 25 MG tablet Take 1 tablet (25 mg total) by mouth 2 (two) times daily. 60 tablet 5  .  nitroGLYCERIN (NITROSTAT) 0.4 MG SL tablet Place 1 tablet (0.4 mg total) under the tongue every 5 (five) minutes x 3 doses as needed for chest pain. 25 tablet 12  . ticagrelor (BRILINTA) 90 MG TABS tablet Take 1 tablet (90 mg total) by mouth 2 (two) times daily. 60 tablet 10   No current facility-administered medications for this visit.    Allergies: Allergies  Allergen Reactions  . Hydrocodone Hives    Social History: The patient  reports that he has been smoking. He does not have any smokeless tobacco history on file. He reports that he drinks alcohol. He reports that he uses illicit drugs.  Family History: The patient's family history includes Diabetes in his other; Heart  attack in his other; Hypertension in his father, mother, and other.   Review of Systems: Please see the history of present illness. Otherwise, the review of systems is positive for chest pain. All other systems are reviewed and negative.   Physical Exam: VS: BP 154/86 mmHg  Pulse 59  Ht  (1.702 m)  Wt 154 lb 1.9 oz (69.908 kg)  BMI 24.13 kg/m2  SpO2 99% . BMI Body mass index is 24.13 kg/(m^2). General: Pleasant. Well developed, well nourished and in no acute distress.  HEENT: Normal.  Neck: Supple, no JVD, carotid bruits, or masses noted.  Cardiac: Regular rate and rhythm. No murmurs, rubs, or gallops. No edema.  Respiratory: Decreased breath sounds but with normal work of breathing.  GI: Soft and nontender.  MS: No deformity or atrophy. Gait and ROM intact.  Skin: Warm and dry. Color is normal. Right wrist has a small knot from his prior cath but is stable.  Neuro: Strength and sensation are intact and no gross focal deficits noted.  Psych: Alert, appropriate and with normal affect.   Wt Readings from Last 3 Encounters:  03/25/14 154 lb 1.9 oz (69.908 kg)  03/05/14 155 lb 6.8 oz (70.5 kg)    LABORATORY DATA:  EKG: EKG is ordered today. The EKG ordered today demonstrates Sinus rhythm. Inferior and lateral ST depression/T wave inversion. Has LVH. The tracing is reviewed with Dr. Excell Seltzer.    Recent Labs    Lab Results  Component Value Date   WBC 7.6 03/05/2014   HGB 13.0 03/05/2014   HCT 39.7 03/05/2014   PLT 244 03/05/2014   GLUCOSE 96 03/05/2014   CHOL 152 03/04/2014   TRIG 100 03/04/2014   HDL 44 03/04/2014   LDLCALC 88 03/04/2014   ALT 14 03/03/2014   AST 24 03/03/2014   NA 137 03/05/2014   K 3.7 03/05/2014   CL 108 03/05/2014   CREATININE 0.91 03/05/2014   BUN 7 03/05/2014   CO2 26 03/05/2014   TSH 0.957 03/03/2014   INR 1.02 03/03/2014   HGBA1C  5.9* 03/03/2014       Recent Labs    Lab Results  Component Value Date   TROPONINI <0.03 03/03/2014       BNP (last 3 results)  Recent Labs (within last 365 days)    No results for input(s): PROBNP in the last 8760 hours.    Other Studies Reviewed Today:  Echo Study Conclusions from 02/2014  - Left ventricle: The cavity size was normal. There was severe concentric hypertrophy. Systolic function was normal. The estimated ejection fraction was in the range of 60% to 65%. Wall motion was normal; there were no regional wall motion abnormalities. Doppler parameters are consistent with abnormal left ventricular relaxation (grade 1  diastolic dysfunction). The E/e&' ratio is <8, suggesting normal LV filling pressure. - Aortic valve: Trileaflet. Sclerosis without stenosis. There was moderate regurgitation. - Mitral valve: Mildly thickened leaflets . There was trivial regurgitation. - Left atrium: The atrium was normal in size. - Right atrium: The atrium was at the upper limits of normal in size. - Inferior vena cava: The vessel was normal in size. The respirophasic diameter changes were in the normal range (>= 50%), consistent with normal central venous pressure.  Impressions:  - LVEF 60-65%, severe concentric LVH, normal wall motion, diastolic dysfunction, normal LV filling pressure, moderate AI, trivial MR.  Cardiac Catheterization Operative Report  Darren [name] [id number] 1/6/20169:21 AM No primary care provider on file.  Procedure Performed:  1. Left Heart Catheterization 2. Selective Coronary Angiography 3. Left ventricular angiogram 4. PTCA/DES x 1 mid LAD  Operator: Verne Carrow, MD  Arterial access site: Right radial artery.   Indication: 44 yo male with history of tobacco abuse admitted with unstable angina, NSTEMI.   Procedure Details: The risks, benefits, complications,  treatment options, and expected outcomes were discussed with the patient. The patient and/or family concurred with the proposed plan, giving informed consent. The patient was brought to the cath lab after IV hydration was begun and oral premedication was given. The patient was further sedated with Versed and Fentanyl. The right wrist was assessed with a modified Allens test which was positive. The right wrist was prepped and draped in a sterile fashion. 1% lidocaine was used for local anesthesia. Using the modified Seldinger access technique, a 5 French sheath was placed in the right radial artery. 3 mg Verapamil was given through the sheath. 4000 units IV heparin was given. Standard diagnostic catheters were used to perform selective coronary angiography. A pigtail catheter was used to perform a left ventricular angiogram. He was found to have a moderate, hazy stenosis in the mid LAD with dye staining suggesting a ruptured plaque. I elected to proceed to PCI of the LAD.   PCI Note: He was given an additional 6000 units of IV heparin. He was given Brilinta 180 mg po x 1. I then engaged the left main with a XB LAD 3.5 guiding catheter. When the ACT was over 200, I passed a Cougar IC wire down the LAD. A 2.5 x 12 mm balloon was used to pre-dilate the stenosis. I then deployed a 3.5 x 16 mm Promus Premier DES in the mid LAD. The stent was post-dilated with a 3.75 x 12 mm Henderson balloon x 1. The stenosis was taken from 60% down to 0%. As above, this was a ruptured plaque with haziness and dye staining suggesting instability and was thus appropriate for treatment with a stent.   The sheath was removed from the right radial artery and a Terumo hemostasis band was applied at the arteriotomy site on the right wrist.   There were no immediate complications. The patient was taken to the recovery area in stable condition.   Hemodynamic Findings: Central aortic pressure: 109/60 Left ventricular pressure:  114/2/10  Angiographic Findings:  Left main: No obstructive disease.   Left Anterior Descending Artery: Large caliber vessel that courses to the apex. The mid LAD has a hazy 60% stenosis with contrast dye staining on the distal edge, c/w a ruptured plaque. (The caudal views demonstrate the haziness and the cranial views best demonstrate the dye staining). The diagonal is moderate in caliber and patent with no obstructive disease.   Circumflex Artery: Large  caliber vessel with large obtuse marginal branch and several small posterolateral branches. No obstructive disease.   Right Coronary Artery: Moderate caliber co-dominant vessel with no obstructive disease.   Left Ventricular Angiogram: LVEF=50-55%.   Impression: 1. Severe single vessel CAD 2. Normal LV systolic function 3. NSTEMI secondary to ulcerated plaque mid LAD 4. Successful PTCA/DES x 1 mid LAD  Recommendations: He will need dual anti-platelet therapy with ASA and Brilinta for at least one year. Continue beta blocker and statin. Smoking cessation.    Complications: None. The patient tolerated the procedure well.      Assessment/Plan: 1. Chest pain - recent NSTEMI - peak troponin was 0.4 - s/p PCI tot he LAD - on DAPT - now with recurrent spells - most likely aggravated by the physical exertion he has been doing and worrisome for unstable angina. NTG x 1 given here with immediate improvement in his discomfort. The patient is subsequently seen with Dr. Excell Seltzer - we will proceed with readmission to [facility] with plans to repeat his cath tomorrow.   2. HTN - Have room to titrate meds if needed.   3. Tobacco abuse - not smoking  4. HLD - on statin  5. Dyspnea - this seems to be chronic and not new according to the patient.   Current medicines are reviewed with the patient today. The patient does not have concerns regarding medicines.  The following changes have been made: no change  Labs/ tests  ordered today include: N/A Orders Placed This Encounter  Procedures  . EKG 12-Lead     Disposition: Further disposition to follow.   Patient is agreeable to this plan and will call if any problems develop in the interim.   Signed: Rosalio Macadamia, RN, ANP-C 03/25/2014 1:48 PM  [facility] [address] [place], [place] [address] Phone: [phone] Fax: [phone]                   Diagnoses     S/P coronary artery stent placement - Primary    ICD-9-CM: V45.82 ICD-10-CM: Z95.5    Chest pain of uncertain etiology     ICD-9-CM: 786.59 ICD-10-CM: R07.89    Tobacco abuse     ICD-9-CM: 305.1 ICD-10-CM: Z72.0    Essential hypertension     ICD-9-CM: 401.9 ICD-10-CM: I10    HLD (hyperlipidemia)     ICD-9-CM: 272.4 ICD-10-CM: E78.5       Reason for Visit     Coronary Artery Disease    Post hospital with PCI to the LAD - seen for Dr. Mayford Knife.     Reason for Visit History        Level of Service     LOS - NO CHARGE [NC1]        All Charges for This Encounter     Code Description Service Date Service Provider Modifiers Qty    93000 PR ELECTROCARDIOGRAM, COMPLETE 03/25/2014 Rosalio Macadamia, NP  1    [phone] PR TOBACCO USE CESSATION INTERMEDIATE 3-10 MINUTES 03/25/2014 Rosalio Macadamia, NP  1    NC1 LOS - NO CHARGE 03/25/2014 Rosalio Macadamia, NP  1      AVS Reports     No AVS Snapshots are available for this encounter.     Patient Instructions     We are going to admit you to the hospital        Routing History     There are no  sent or routed communications associated with this encounter.     Previous Visit       Provider Department Encounter #    03/19/2014 4:28 PM Norma FredricksonLORI GERHARDT, NP [facility] [id number]      Patient seen, examined. Available data reviewed. Agree with findings, assessment, and plan as outlined by Harvel RicksLari  Gerhardt, NP. The patient describes symptoms of typical angina with activity and rest. His symptoms of been responsive to sublingual nitroglycerin. Seems unlikely that he would have a problem in this early after PCI, especially in the setting of single-vessel disease. However, with typical symptoms that seem to be progressive in nature, favor relook cardiac catheterization. Reviewed risks, indications, and alternatives with the patient. He will be admitted to the hospital and undergo cardiac catheterization tomorrow.  Tonny BollmanMichael Kristian Mogg, M.D. 03/25/2014 5:48 PM

## 2014-03-25 NOTE — Progress Notes (Deleted)
CARDIOLOGY OFFICE NOTE  Date:  03/25/2014    Darren Ageeharles A Bee Date of Birth: Mar 31, 1970 Medical Record #[id number]  PCP:  No primary care provider on file.  Cardiologist:  ***    Chief Complaint  Patient presents with  . Coronary Artery Disease    Post hospital with PCI to the LAD - seen for Dr. Mayford Knifeurner.      History of Present Illness: Darren [name] is a 44 y.o. male who presents today for a ***   Past Medical History  Diagnosis Date  . Hypertension   . Tobacco abuse     Past Surgical History  Procedure Laterality Date  . Left heart catheterization with coronary angiogram Bilateral 03/04/2014    Procedure: LEFT HEART CATHETERIZATION WITH CORONARY ANGIOGRAM;  Surgeon: Kathleene Hazelhristopher D McAlhany, MD;  [facility];  Service: Cardiovascular;  Laterality: Bilateral;     Medications: Current Outpatient Prescriptions  Medication Sig Dispense Refill  . aspirin EC 81 MG EC tablet Take 1 tablet (81 mg total) by mouth daily.    Marland Kitchen. atorvastatin (LIPITOR) 80 MG tablet Take 1 tablet (80 mg total) by mouth daily at 6 PM. 30 tablet 5  . hydrochlorothiazide (MICROZIDE) 12.5 MG capsule Take 1 capsule (12.5 mg total) by mouth daily. 30 capsule 5  . metoprolol tartrate (LOPRESSOR) 25 MG tablet Take 1 tablet (25 mg total) by mouth 2 (two) times daily. 60 tablet 5  . nitroGLYCERIN (NITROSTAT) 0.4 MG SL tablet Place 1 tablet (0.4 mg total) under the tongue every 5 (five) minutes x 3 doses as needed for chest pain. 25 tablet 12  . ticagrelor (BRILINTA) 90 MG TABS tablet Take 1 tablet (90 mg total) by mouth 2 (two) times daily. 60 tablet 10   No current facility-administered medications for this visit.    Allergies: Allergies  Allergen Reactions  . Hydrocodone Hives    Social History: The patient  reports that he has been smoking.  He does not have any smokeless tobacco history on file. He reports that he drinks alcohol. He reports that he uses illicit drugs.   Family  History: The patient's ***family history includes Diabetes in his other; Heart attack in his other; Hypertension in his father, mother, and other.   Review of Systems: Please see the history of present illness.   Otherwise, the review of systems is positive for {NONE DEFAULTED:18576}.   All other systems are reviewed and negative.   Physical Exam: VS:  There were no vitals taken for this visit. Marland Kitchen.  BMI There is no weight on file to calculate BMI. General: Pleasant. Well developed, well nourished and in no acute distress.  HEENT: Normal. Neck: Supple, no JVD, carotid bruits, or masses noted.  Cardiac: ***Regular rate and rhythm. No murmurs, rubs, or gallops. No edema.  Respiratory:  Lungs are clear to auscultation bilaterally with normal work of breathing.  GI: Soft and nontender.  MS: No deformity or atrophy. Gait and ROM intact. Skin: Warm and dry. Color is normal.  Neuro:  Strength and sensation are intact and no gross focal deficits noted.  Psych: Alert, appropriate and with normal affect.   Wt Readings from Last 3 Encounters:  03/05/14 155 lb 6.8 oz (70.5 kg)    LABORATORY DATA:  EKG:  EKG {ACTION; IS/IS [id number]} ordered today. The EKG ordered today demonstrates ***.  Lab Results  Component Value Date   WBC 7.6 03/05/2014   HGB 13.0 03/05/2014   HCT 39.7 03/05/2014  PLT 244 03/05/2014   GLUCOSE 96 03/05/2014   CHOL 152 03/04/2014   TRIG 100 03/04/2014   HDL 44 03/04/2014   LDLCALC 88 03/04/2014   ALT 14 03/03/2014   AST 24 03/03/2014   NA 137 03/05/2014   K 3.7 03/05/2014   CL 108 03/05/2014   CREATININE 0.91 03/05/2014   BUN 7 03/05/2014   CO2 26 03/05/2014   TSH 0.957 03/03/2014   INR 1.02 03/03/2014   HGBA1C 5.9* 03/03/2014    BNP (last 3 results) No results for input(s): PROBNP in the last 8760 hours.  Other Studies Reviewed Today:   Assessment/Plan:   Current medicines are reviewed with the patient today.  The patient {ACTIONS; HAS/DOES NOT  HAVE:19233} concerns regarding medicines.  The following changes have been made:  {PLAN; NO CHANGE:13088:s}  Labs/ tests ordered today include: *** Orders Placed This Encounter  Procedures  . EKG 12-Lead     Disposition:   FU with *** in {gen number 1-61:096045} {TIME; UNITS DAY/WEEK/MONTH:19136}  Patient is agreeable to this plan and will call if any problems develop in the interim.   Signed: Rosalio Macadamia, RN, ANP-C 03/25/2014 1:38 PM  [facility] [address] [place], [place]  [address] Phone: [phone] Fax: [phone]

## 2014-03-25 NOTE — Progress Notes (Signed)
ANTICOAGULATION CONSULT NOTE - Initial Consult  Pharmacy Consult for LMWH Indication:   Allergies  Allergen Reactions  . Hydrocodone Hives    Patient Measurements:   Weight: 69.9 kg  Vital Signs: Temp: 98.7 F (37.1 C) (01/27 1545) Temp Source: Oral (01/27 1545) BP: 122/61 mmHg (01/27 1602) Pulse Rate: 64 (01/27 1545)  Labs: No results for input(s): HGB, HCT, PLT, APTT, LABPROT, INR, HEPARINUNFRC, CREATININE, CKTOTAL, CKMB, TROPONINI in the last 72 hours.  Estimated Creatinine Clearance: 96.8 mL/min (by C-G formula based on Cr of 0.91).   Medical History: Past Medical History  Diagnosis Date  . Hypertension   . Tobacco abuse     Medications:  Prescriptions prior to admission  Medication Sig Dispense Refill Last Dose  . aspirin EC 81 MG EC tablet Take 1 tablet (81 mg total) by mouth daily.   Taking  . atorvastatin (LIPITOR) 80 MG tablet Take 1 tablet (80 mg total) by mouth daily at 6 PM. 30 tablet 5 Taking  . hydrochlorothiazide (MICROZIDE) 12.5 MG capsule Take 1 capsule (12.5 mg total) by mouth daily. 30 capsule 5 Taking  . metoprolol tartrate (LOPRESSOR) 25 MG tablet Take 1 tablet (25 mg total) by mouth 2 (two) times daily. 60 tablet 5 Taking  . nitroGLYCERIN (NITROSTAT) 0.4 MG SL tablet Place 1 tablet (0.4 mg total) under the tongue every 5 (five) minutes x 3 doses as needed for chest pain. 25 tablet 12 Taking  . ticagrelor (BRILINTA) 90 MG TABS tablet Take 1 tablet (90 mg total) by mouth 2 (two) times daily. 60 tablet 10 Taking    Assessment: 44 yo to start LMWH per pharmacy for chest pain/rule out MI. Wt 70 kg. On 03/05/14 his CBC and renal function were WNL and he is not on oral anticoagulants.  Goal of Therapy:  Anti-Xa level 0.6-1 units/ml 4hrs after LMWH dose given Monitor platelets by anticoagulation protocol: Yes   Plan:  Lovenox 1 mg/kg sq q12h = 70 q12 F/u renal fxn, cbc, s/sx of bleeding  Herby AbrahamMichelle T. Ceri Mayer, Pharm.D. [phone] 03/25/2014 4:06 PM

## 2014-03-25 NOTE — Progress Notes (Signed)
CARDIOLOGY OFFICE NOTE  Date:  03/25/2014    Darren [name] Date of Birth: 1970/03/29 Medical Record #[id number]  PCP:  No primary care provider on file.  Cardiologist:  Mayford Knife    Chief Complaint  Patient presents with  . Coronary Artery Disease    Post hospital with PCI to the LAD - seen for Dr. Mayford Knife.      History of Present Illness: Darren [name] is a 44 y.o. male who presents today for a post hospital visit - s/p PCI. He is seen for Dr. Mayford Knife. He has a history of HTN but no prior cardiac history.   Presented to the ER earlier this month with chest pain and dyspnea. Elevated troponin. Was cathed and had ulcerated plaque in the mid LAD that was treated with PCI/DES x 1. Echo revealed LVEF 60-65%, severe concentric LVH, normal wall motion, diastolicdysfunction, normal LV filling pressure, moderate AI, trivial MR. Discharged on DAPT with Brilinta and aspirin. Discharged on 03/05/2014. Peak troponin was 0.4.   Comes back today. Here alone. He has been home about 3 weeks. Initially did well with no chest pain. He has had chronic shortness of breath. He did well until the snow came. 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 He would have chest discomfort - relieved with rest but had to take NTG last night.for pain that was a "3" on scale of 0 to 10.  He did not go to work yesterday - too fatigued - spent the day in bed which is unusual for him - The NTG did help. Coming in here, he has some recurrence - not as bad but relieved with NTG. Rates this a "1". He has been compliant with his medicines. He is not smoking. He notes that this discomfort is very similar to what he presented with - just not as severe.    Past Medical History  Diagnosis Date  . Hypertension   . Tobacco abuse     Past Surgical History  Procedure Laterality Date  . Left heart catheterization with coronary angiogram Bilateral 03/04/2014    Procedure: LEFT HEART  CATHETERIZATION WITH CORONARY ANGIOGRAM;  Surgeon: Kathleene Hazel, MD;  Location: [facility] CATH LAB;  Service: Cardiovascular;  Laterality: Bilateral;     Medications: Current Outpatient Prescriptions  Medication Sig Dispense Refill  . aspirin EC 81 MG EC tablet Take 1 tablet (81 mg total) by mouth daily.    Marland Kitchen atorvastatin (LIPITOR) 80 MG tablet Take 1 tablet (80 mg total) by mouth daily at 6 PM. 30 tablet 5  . hydrochlorothiazide (MICROZIDE) 12.5 MG capsule Take 1 capsule (12.5 mg total) by mouth daily. 30 capsule 5  . metoprolol tartrate (LOPRESSOR) 25 MG tablet Take 1 tablet (25 mg total) by mouth 2 (two) times daily. 60 tablet 5  . nitroGLYCERIN (NITROSTAT) 0.4 MG SL tablet Place 1 tablet (0.4 mg total) under the tongue every 5 (five) minutes x 3 doses as needed for chest pain. 25 tablet 12  . ticagrelor (BRILINTA) 90 MG TABS tablet Take 1 tablet (90 mg total) by mouth 2 (two) times daily. 60 tablet 10   No current facility-administered medications for this visit.    Allergies: Allergies  Allergen Reactions  . Hydrocodone Hives    Social History: The patient  reports that he has been smoking.  He does not have any smokeless tobacco history on file. He reports that he drinks alcohol. He reports that  he uses illicit drugs.   Family History: The patient's family history includes Diabetes in his other; Heart attack in his other; Hypertension in his father, mother, and other.   Review of Systems: Please see the history of present illness.   Otherwise, the review of systems is positive for chest pain.   All other systems are reviewed and negative.   Physical Exam: VS:  BP 154/86 mmHg  Pulse 59  Ht 5\' 7"  (1.702 m)  Wt 154 lb 1.9 oz (69.908 kg)  BMI 24.13 kg/m2  SpO2 99% .  BMI Body mass index is 24.13 kg/(m^2). General: Pleasant. Well developed, well nourished and in no acute distress.  HEENT: Normal. Neck: Supple, no JVD, carotid bruits, or masses noted.  Cardiac: Regular  rate and rhythm. No murmurs, rubs, or gallops. No edema.  Respiratory:  Decreased breath sounds but with normal work of breathing.  GI: Soft and nontender.  MS: No deformity or atrophy. Gait and ROM intact. Skin: Warm and dry. Color is normal. Right wrist has a small knot from his prior cath but is stable.  Neuro:  Strength and sensation are intact and no gross focal deficits noted.  Psych: Alert, appropriate and with normal affect.   Wt Readings from Last 3 Encounters:  03/25/14 154 lb 1.9 oz (69.908 kg)  03/05/14 155 lb 6.8 oz (70.5 kg)    LABORATORY DATA:  EKG:  EKG is ordered today. The EKG ordered today demonstrates Sinus rhythm. Inferior and lateral ST depression/T wave inversion. Has LVH. The tracing is reviewed with Dr. Excell Seltzerooper.   Lab Results  Component Value Date   WBC 7.6 03/05/2014   HGB 13.0 03/05/2014   HCT 39.7 03/05/2014   PLT 244 03/05/2014   GLUCOSE 96 03/05/2014   CHOL 152 03/04/2014   TRIG 100 03/04/2014   HDL 44 03/04/2014   LDLCALC 88 03/04/2014   ALT 14 03/03/2014   AST 24 03/03/2014   NA 137 03/05/2014   K 3.7 03/05/2014   CL 108 03/05/2014   CREATININE 0.91 03/05/2014   BUN 7 03/05/2014   CO2 26 03/05/2014   TSH 0.957 03/03/2014   INR 1.02 03/03/2014   HGBA1C 5.9* 03/03/2014    Lab Results  Component Value Date   TROPONINI <0.03 03/03/2014     BNP (last 3 results) No results for input(s): PROBNP in the last 8760 hours.  Other Studies Reviewed Today:  Echo Study Conclusions from 02/2014  - Left ventricle: The cavity size was normal. There was severe concentric hypertrophy. Systolic function was normal. The estimated ejection fraction was in the range of 60% to 65%. Wall motion was normal; there were no regional wall motion abnormalities. Doppler parameters are consistent with abnormal left ventricular relaxation (grade 1 diastolic dysfunction). The E/e&' ratio is <8, suggesting normal LV filling pressure. - Aortic valve:  Trileaflet. Sclerosis without stenosis. There was moderate regurgitation. - Mitral valve: Mildly thickened leaflets . There was trivial regurgitation. - Left atrium: The atrium was normal in size. - Right atrium: The atrium was at the upper limits of normal in size. - Inferior vena cava: The vessel was normal in size. The respirophasic diameter changes were in the normal range (>= 50%), consistent with normal central venous pressure.  Impressions:  - LVEF 60-65%, severe concentric LVH, normal wall motion, diastolic dysfunction, normal LV filling pressure, moderate AI, trivial MR.  Cardiac Catheterization Operative Report  Darren AgeeCharles A Doby [id number] 1/6/20169:21 AM No primary care provider on file.  Procedure Performed:  1. Left Heart Catheterization 2. Selective Coronary Angiography 3. Left ventricular angiogram 4. PTCA/DES x 1 mid LAD  Operator: Verne Carrow, MD  Arterial access site: Right radial artery.   Indication: 44 yo male with history of tobacco abuse admitted with unstable angina, NSTEMI.   Procedure Details: The risks, benefits, complications, treatment options, and expected outcomes were discussed with the patient. The patient and/or family concurred with the proposed plan, giving informed consent. The patient was brought to the cath lab after IV hydration was begun and oral premedication was given. The patient was further sedated with Versed and Fentanyl. The right wrist was assessed with a modified Allens test which was positive. The right wrist was prepped and draped in a sterile fashion. 1% lidocaine was used for local anesthesia. Using the modified Seldinger access technique, a 5 French sheath was placed in the right radial artery. 3 mg Verapamil was given through the sheath. 4000 units IV heparin was given. Standard diagnostic catheters were used to perform selective coronary angiography. A pigtail catheter  was used to perform a left ventricular angiogram. He was found to have a moderate, hazy stenosis in the mid LAD with dye staining suggesting a ruptured plaque. I elected to proceed to PCI of the LAD.   PCI Note: He was given an additional 6000 units of IV heparin. He was given Brilinta 180 mg po x 1. I then engaged the left main with a XB LAD 3.5 guiding catheter. When the ACT was over 200, I passed a Cougar IC wire down the LAD. A 2.5 x 12 mm balloon was used to pre-dilate the stenosis. I then deployed a 3.5 x 16 mm Promus Premier DES in the mid LAD. The stent was post-dilated with a 3.75 x 12 mm [place] balloon x 1. The stenosis was taken from 60% down to 0%. As above, this was a ruptured plaque with haziness and dye staining suggesting instability and was thus appropriate for treatment with a stent.   The sheath was removed from the right radial artery and a Terumo hemostasis band was applied at the arteriotomy site on the right wrist.   There were no immediate complications. The patient was taken to the recovery area in stable condition.   Hemodynamic Findings: Central aortic pressure: 109/60 Left ventricular pressure: 114/2/10  Angiographic Findings:  Left main: No obstructive disease.   Left Anterior Descending Artery: Large caliber vessel that courses to the apex. The mid LAD has a hazy 60% stenosis with contrast dye staining on the distal edge, c/w a ruptured plaque. (The caudal views demonstrate the haziness and the cranial views best demonstrate the dye staining). The diagonal is moderate in caliber and patent with no obstructive disease.   Circumflex Artery: Large caliber vessel with large obtuse marginal branch and several small posterolateral branches. No obstructive disease.   Right Coronary Artery: Moderate caliber co-dominant vessel with no obstructive disease.   Left Ventricular Angiogram: LVEF=50-55%.   Impression: 1. Severe single vessel CAD 2. Normal LV systolic  function 3. NSTEMI secondary to ulcerated plaque mid LAD 4. Successful PTCA/DES x 1 mid LAD  Recommendations: He will need dual anti-platelet therapy with ASA and Brilinta for at least one year. Continue beta blocker and statin. Smoking cessation.    Complications: None. The patient tolerated the procedure well.      Assessment/Plan: 1. Chest pain - recent NSTEMI - peak troponin was 0.4 - s/p PCI tot he LAD - on DAPT - now with recurrent spells -  most likely aggravated by the physical exertion he has been doing and worrisome for unstable angina. NTG x 1 given here with immediate improvement in his discomfort. The patient is subsequently seen with Dr. Excell Seltzer - we will proceed with readmission to [facility] with plans to repeat his cath tomorrow.   2. HTN - Have room to titrate meds if needed.   3. Tobacco abuse - not smoking  4. HLD - on statin  5. Dyspnea - this seems to be chronic and not new according to the patient.   Current medicines are reviewed with the patient today.  The patient does not have concerns regarding medicines.  The following changes have been made:  no change  Labs/ tests ordered today include: N/A  Orders Placed This Encounter  Procedures  . EKG 12-Lead     Disposition:   Further disposition to follow.   Patient is agreeable to this plan and will call if any problems develop in the interim.   Signed: Rosalio Macadamia, RN, ANP-C 03/25/2014 1:48 PM  [facility] [address] [place], [place]  [address] Phone: [phone] Fax: [phone]

## 2014-03-25 NOTE — Progress Notes (Signed)
Pt c/o CP 3/10 1SL Ntg given. 1550 CP 1/10 BP 120/67 2nd SL Ntg given. Emelda Brothershristy Colby Catanese RN

## 2014-03-26 ENCOUNTER — Encounter (HOSPITAL_COMMUNITY): Payer: Self-pay | Admitting: Cardiovascular Disease

## 2014-03-26 ENCOUNTER — Ambulatory Visit (HOSPITAL_COMMUNITY)
Admission: RE | Admit: 2014-03-26 | Payer: BLUE CROSS/BLUE SHIELD | Source: Ambulatory Visit | Admitting: Cardiovascular Disease

## 2014-03-26 ENCOUNTER — Encounter (HOSPITAL_COMMUNITY)
Admission: AD | Disposition: A | Discharge status: Home / Self Care | Payer: Self-pay | Source: Ambulatory Visit | Attending: Cardiology

## 2014-03-26 DIAGNOSIS — I2510 Atherosclerotic heart disease of native coronary artery without angina pectoris: Secondary | ICD-10-CM | POA: Diagnosis not present

## 2014-03-26 DIAGNOSIS — R079 Chest pain, unspecified: Secondary | ICD-10-CM | POA: Diagnosis not present

## 2014-03-26 DIAGNOSIS — I2583 Coronary atherosclerosis due to lipid rich plaque: Secondary | ICD-10-CM

## 2014-03-26 DIAGNOSIS — I119 Hypertensive heart disease without heart failure: Secondary | ICD-10-CM | POA: Diagnosis not present

## 2014-03-26 HISTORY — PX: LEFT HEART CATHETERIZATION WITH CORONARY ANGIOGRAM: SHX5451

## 2014-03-26 LAB — TROPONIN I: Troponin I: <0.03 ng/mL (ref <0.031)

## 2014-03-26 SURGERY — LEFT HEART CATHETERIZATION WITH CORONARY ANGIOGRAM

## 2014-03-26 MED ORDER — ONDANSETRON HCL 4 MG/2ML IJ SOLN: 4.0000 mg | Freq: Four times a day (QID) | INTRAMUSCULAR | Status: DC | PRN

## 2014-03-26 MED ORDER — ASPIRIN 81 MG PO CHEW: 81.0000 mg | CHEWABLE_TABLET | Freq: Every day | ORAL | Status: DC

## 2014-03-26 MED ORDER — NITROGLYCERIN 1 MG/10 ML FOR IR/CATH LAB
INTRA_ARTERIAL | Status: AC
  Filled 2014-03-26: qty 10

## 2014-03-26 MED ORDER — FENTANYL CITRATE 0.05 MG/ML IJ SOLN
INTRAMUSCULAR | Status: AC
  Filled 2014-03-26: qty 2

## 2014-03-26 MED ORDER — HEPARIN SODIUM (PORCINE) 1000 UNIT/ML IJ SOLN
INTRAMUSCULAR | Status: AC
  Filled 2014-03-26: qty 1

## 2014-03-26 MED ORDER — ACETAMINOPHEN 325 MG PO TABS: 650.0000 mg | ORAL_TABLET | ORAL | Status: DC | PRN

## 2014-03-26 MED ORDER — HEPARIN (PORCINE) IN NACL 2-0.9 UNIT/ML-% IJ SOLN
INTRAMUSCULAR | Status: AC
  Filled 2014-03-26: qty 1500

## 2014-03-26 MED ORDER — CLOPIDOGREL BISULFATE 75 MG PO TABS: 75.0000 mg | ORAL_TABLET | Freq: Every day | ORAL | Status: DC

## 2014-03-26 MED ORDER — MIDAZOLAM HCL 2 MG/2ML IJ SOLN
INTRAMUSCULAR | Status: AC
  Filled 2014-03-26: qty 2

## 2014-03-26 MED ORDER — LIDOCAINE HCL (PF) 1 % IJ SOLN
INTRAMUSCULAR | Status: AC
  Filled 2014-03-26: qty 30

## 2014-03-26 MED ORDER — VERAPAMIL HCL 2.5 MG/ML IV SOLN
INTRAVENOUS | Status: AC
  Filled 2014-03-26: qty 2

## 2014-03-26 MED ORDER — SODIUM CHLORIDE 0.9 % IV SOLN: INTRAVENOUS | Status: DC

## 2014-03-26 NOTE — Discharge Summary (Signed)
Discharge Summary   Patient ID: Darren [name],  MRN: [id number], DOB/AGE: 45-Sep-1972 45 y.o.  Admit date: 03/25/2014 Discharge date: 03/26/2014  Primary Care Provider: No primary care provider on file. Primary Cardiologist: Dr. Mayford Knifeurner  Discharge Diagnoses Principal Problem:   Chest pain Active Problems:   Hypertensive heart disease   Tobacco abuse   Coronary artery disease due to lipid rich plaque   Allergies Allergies  Allergen Reactions  . Hydrocodone Hives    Procedures  Cardiac catheterization IMPRESSION:  Low normal LV function with an ejection fraction of 50-55% without focal segmental wall motion abnormalities.  Normal coronary arteries with evidence for a widely patent previously placed mid LAD stent without evidence for restenosis.   RECOMMENDATION:  Medical therapy.    Hospital Course  Darren [name] is a 44 year old African-American male with history of hypertension and a CAD who recently underwent PCI/DES to mid LAD in early January. He was discharged on aspirin and Brilinta which according to the patient he has been compliant with. [facility] [address] [facility] for follow-up on 03/25/2014, during interview, he complained of chest discomfort would be relieved with rest and nitroglycerin. The symptom was concerning for stent rethrombosis, he was admitted from the office for cardiac catheterization.  Overnight, his serial troponins were negative. He underwent a scheduled cardiac catheterization in the morning of 03/26/2014. Cardiac catheterization showed EF 50-55%, without focal segmental wall motion abnormality, widely patent mid LAD stent without evidence of restenosis. Post cath, his radial cath site appears to be stable without significant bleeding. He is deemed stable for discharge from cardiology perspective. I have arranged follow-up with Dr. Norris Crossurner's PA/NP in the clinic in 2-4 weeks. Patient is a landscaper, I have instructed him not to lift anything  greater than 5 pounds for one week after cath.   Discharge Vitals Blood pressure 139/72, pulse 52, temperature 97.9 F (36.6 C), temperature source Oral, resp. rate 18, weight 145 lb 8 oz (65.998 kg), SpO2 100 %.  Filed Weights   03/26/14 0400  Weight: 145 lb 8 oz (65.998 kg)    Labs  CBC  Recent Labs  03/25/14 1540  WBC 7.4  NEUTROABS 4.5  HGB 14.1  HCT 42.3  MCV 85.6  PLT 266   Basic Metabolic Panel  Recent Labs  03/25/14 1540  NA 136  K 3.5  CL 104  CO2 23  GLUCOSE 90  BUN 10  CREATININE 1.06  CALCIUM 8.7  MG 2.0   Liver Function Tests  Recent Labs  03/25/14 1540  AST 26  ALT 22  ALKPHOS 80  BILITOT 0.6  PROT 6.6  ALBUMIN 3.7   Cardiac Enzymes  Recent Labs  03/25/14 1540 03/25/14 2151 03/26/14 0549  TROPONINI <0.03 <0.03 <0.03    Disposition  Pt is being discharged home today in good condition.  Follow-up Plans & Appointments      Follow-up Information    Follow up with Nicolasa Duckinghristopher Berge, NP On 04/13/2014.   Specialty:  Nurse Practitioner   Why:  9:30am   Contact information:   [address] [place] [address] [phone]       Discharge Medications    Medication List    TAKE these medications        aspirin 81 MG EC tablet  Take 1 tablet (81 mg total) by mouth daily.     atorvastatin 80 MG tablet  Commonly known as:  LIPITOR  Take 1 tablet (80 mg total) by  mouth daily at 6 PM.     diazepam 5 MG tablet  Commonly known as:  VALIUM  Take 5 mg by mouth daily.     escitalopram 10 MG tablet  Commonly known as:  LEXAPRO  Take 10 mg by mouth daily.     hydrochlorothiazide 12.5 MG capsule  Commonly known as:  MICROZIDE  Take 1 capsule (12.5 mg total) by mouth daily.     metoprolol tartrate 25 MG tablet  Commonly known as:  LOPRESSOR  Take 1 tablet (25 mg total) by mouth 2 (two) times daily.     nitroGLYCERIN 0.4 MG SL tablet  Commonly known as:  NITROSTAT  Place 1 tablet (0.4 mg total)  under the tongue every 5 (five) minutes x 3 doses as needed for chest pain.     ticagrelor 90 MG Tabs tablet  Commonly known as:  BRILINTA  Take 1 tablet (90 mg total) by mouth 2 (two) times daily.         Duration of Discharge Encounter   Greater than 30 minutes including physician time.  Ramond Dial PA-C Pager: [phone] 03/26/2014, 1:14 PM

## 2014-03-26 NOTE — Progress Notes (Signed)
Patient Name: Darren [name] Date of Encounter: 03/26/2014     Active Problems:   Chest pain   Unstable angina    SUBJECTIVE  Mild chest pain and SOB this morning, reminiscent of previous NSTEMI. States he has been compliant with daily ASA and BID dosing of Brilinta. Also some SOB this morning as well.   CURRENT MEDS . [START ON 03/27/2014] aspirin EC  81 mg Oral Daily  . atorvastatin  80 mg Oral q1800  . diazepam  10 mg Oral On Call  . enoxaparin (LOVENOX) injection  1 mg/kg Subcutaneous Q12H  . hydrochlorothiazide  12.5 mg Oral Daily  . metoprolol tartrate  25 mg Oral BID  . sodium chloride  3 mL Intravenous Q12H  . ticagrelor  90 mg Oral BID    OBJECTIVE  Filed Vitals:   03/25/14 1553 03/25/14 1602 03/25/14 2123 03/26/14 0400  BP: 120/67 122/61 139/69 129/76  Pulse:   62 61  Temp:   98.5 F (36.9 C) 97.9 F (36.6 C)  TempSrc:   Oral Oral  Resp:      Weight:    145 lb 8 oz (65.998 kg)  SpO2:   100% 99%    Intake/Output Summary (Last 24 hours) at 03/26/14 0821 Last data filed at 03/25/14 2150  Gross per 24 hour  Intake    582 ml  Output      0 ml  Net    582 ml   Filed Weights   03/26/14 0400  Weight: 145 lb 8 oz (65.998 kg)    PHYSICAL EXAM  General: Pleasant, NAD. Neuro: Alert and oriented X 3. Moves all extremities spontaneously. Psych: Normal affect. HEENT:  Normal  Neck: Supple without bruits or JVD. Lungs:  Resp regular and unlabored, CTA. Heart: RRR no s3, s4, or murmurs. Abdomen: Soft, non-tender, non-distended, BS + x 4.  Extremities: No clubbing, cyanosis or edema. DP/PT/Radials 2+ and equal bilaterally.  Accessory Clinical Findings  CBC  Recent Labs  03/25/14 1540  WBC 7.4  NEUTROABS 4.5  HGB 14.1  HCT 42.3  MCV 85.6  PLT 266   Basic Metabolic Panel  Recent Labs  03/25/14 1540  NA 136  K 3.5  CL 104  CO2 23  GLUCOSE 90  BUN 10  CREATININE 1.06  CALCIUM 8.7  MG 2.0   Liver Function Tests  Recent Labs  03/25/14 1540  AST 26  ALT 22  ALKPHOS 80  BILITOT 0.6  PROT 6.6  ALBUMIN 3.7   Cardiac Enzymes  Recent Labs  03/25/14 1540 03/25/14 2151 03/26/14 0549  TROPONINI <0.03 <0.03 <0.03    TELE NSR with HR 50s    ECG  Sinus brady with LVH, no significant ST-T wave changes  Echocardiogram 03/03/2014  - Left ventricle: The cavity size was normal. There was severe concentric hypertrophy. Systolic function was normal. The estimated ejection fraction was in the range of 60% to 65%. Wall motion was normal; there were no regional wall motion abnormalities. Doppler parameters are consistent with abnormal left ventricular relaxation (grade 1 diastolic dysfunction). The E/e&' ratio is <8, suggesting normal LV filling pressure. - Aortic valve: Trileaflet. Sclerosis without stenosis. There was moderate regurgitation. - Mitral valve: Mildly thickened leaflets . There was trivial regurgitation. - Left atrium: The atrium was normal in size. - Right atrium: The atrium was at the upper limits of normal in size. - Inferior vena cava: The vessel was normal in size. The respirophasic diameter changes were in the  normal range (>= 50%), consistent with normal central venous pressure.  Impressions:  - LVEF 60-65%, severe concentric LVH, normal wall motion, diastolic dysfunction, normal LV filling pressure, moderate AI, trivial MR    Radiology/Studies  X-ray Chest Pa And Lateral  03/25/2014   CLINICAL DATA:  Unstable angina  EXAM: CHEST  2 VIEW  COMPARISON:  03/02/2014  FINDINGS: The heart size and mediastinal contours are within normal limits. Both lungs are clear. The visualized skeletal structures are unremarkable.  IMPRESSION: No active cardiopulmonary disease.   Electronically Signed   By: Signa Kell M.D.   On: 03/25/2014 21:45   Dg Chest 2 View  03/02/2014   CLINICAL DATA:  Initial evaluation left chest pain, sharp, intermittent, for few days, patient  smokes, hypertension  EXAM: CHEST  2 VIEW  COMPARISON:  None.  FINDINGS: Mild left-sided cardiac enlargement. Vascular pattern normal. Lungs clear. No effusion. Bony thorax intact.  IMPRESSION: Cardiac enlargement, otherwise negative   Electronically Signed   By: Esperanza Heir M.D.   On: 03/02/2014 21:33    ASSESSMENT AND PLAN  44 yo male with recent PCI to mid LAD on 03/04/2014 present with typical angina symptom on followup. Plan for relook cath.   1. Progressive Angina  - although low likelihood reocclusion, however given similarity to previous angina symptom prior to PCI, will proceed with relook cath today. Checked with cath lab, patient on schedule  2. CAD s/p DES to mid LAD 03/04/2014  - EF 50-55% during cath, single vessel dx, DES to mid LAD  3. HTN 4. Tobacco abuse  Signed, Azalee Course PA-C Pager: [phone]  Agree with note by Azalee Course PA-C  Results of cath noted. Patent mid LAD stent, otherwise nl cors and LV fxn. Enz neg. TR band on. OK for DC later today. F/U with MLP 2-3 weeks, primary cardiologist 6-8 weeks   Runell Gess, M.D., FACP, [facility], Kathryne Eriksson [facility] [address] [place], [place]  [address]  [phone] 03/26/2014 11:58 AM

## 2014-03-26 NOTE — Progress Notes (Signed)
UR completed 

## 2014-03-26 NOTE — CV Procedure (Signed)
Darren [name] is a 44 y.o. male   [id number]  [id number] LOCATION:  FACILITY: [facility]  PHYSICIAN: Lennette Biharihomas A. Moneisha Vosler, MD, [facility] [birth year]   DATE OF PROCEDURE:  03/26/2014     CARDIAC CATHETERIZATION    HISTORY:   Darren [name] is a 44 year old African-American male who underwent PCI with DES stenting of an ulcerated plaque in his mid LAD 3 weeks ago.  He was seen in the office yesterday by Dr. Excell Seltzerooper after he experienced recurrent chest pain, particularly precipitated by shoveling snow.  Due to concerns of  similar chest pain he was admitted to the hospital and presents for cardiac catheterization this morning.   PROCEDURE: Left heart catheterization via the right radial artery: Coronary angiography, left ventriculography.  The patient was brought to the second floor [facility] cath lab in the fasting state. The patient was premedicated with Versed 2 mg and fentanyl 50 mcg. A right radial approach was utilized after an Allen's test verified adequate circulation. The right radial artery was punctured via the Seldinger technique, and a 6 [place] Slender was inserted without difficulty.  A radial cocktail consisting of Verapamil, IV nitroglycerin, and lidocaine was administered. Weight adjusted heparin was administered. A safety J wire was advanced into the ascending aorta. Diagnostic catheterization was done with a 5 [place] TIG 4.0 catheter. A 5 French pigtail catheter was used for left ventriculography. A TR radial band was applied for hemostasis. The patient left the catheterization laboratory in stable condition.   HEMODYNAMICS:   Central Aorta: 110/60  Left Ventricle: 110/10  ANGIOGRAPHY:   The left main coronary artery was angiographically normal and trifurcated into the LAD, ramus intermediate and left circumflex coronary artery.   The LAD was angiographically normal and gave rise to 2 major diagonal vessels and several septal perforating arteries.  The stent  which had previously been placed in the mid LAD was widely patent without evidence for restenosis.  The vessel extended to the LV apex.   The ramus intermediate vessel was a small caliber normal optional diagonal vessel  The left circumflex coronary artery was an angiographically normal very large vessel and gave rise to one major bifurcating obtuse marginal branch and 3 distal branches.   The RCA was angiographically normal small vessel  Left ventriculography revealed normal global LV contractility without focal segmental wall motion abnormalities. There was no evidence for mitral regurgitation.  Ejection fraction is 50-55%.  There was no evidence for mitral regurgitation.   Total contrast used: 55 cc Omnipaque  IMPRESSION:  Low normal LV function with an ejection fraction of 50-55% without focal segmental wall motion abnormalities.  Normal coronary arteries with evidence for a widely patent previously placed mid LAD stent without evidence for restenosis.   RECOMMENDATION:  Medical therapy.   Lennette Biharihomas A. Alec Mcphee, MD, [facility] 03/26/2014 11:14 AM

## 2014-03-26 NOTE — Discharge Instructions (Signed)
No driving for 24 hrs. No lifting over 5 lbs for 1 week. No sexual activity for 1 week. You may return to work on 04/06/2014. Keep procedure site clean & dry. If you notice increased pain, swelling, bleeding or pus, call/return!  You may shower, but no soaking baths/hot tubs/pools for 1 week.

## 2014-03-26 NOTE — Interval H&P Note (Signed)
Cath Lab Visit (complete for each Cath Lab visit)  Clinical Evaluation Leading to the Procedure:   ACS: No.  Non-ACS:    Anginal Classification: CCS III  Anti-ischemic medical therapy: Maximal Therapy (2 or more classes of medications)  Non-Invasive Test Results: No non-invasive testing performed  Prior CABG: No previous CABG      History and Physical Interval Note:  03/26/2014 10:28 AM  Darren [name]  has presented today for surgery, with the diagnosis of cp  The various methods of treatment have been discussed with the patient and family. After consideration of risks, benefits and other options for treatment, the patient has consented to  Procedure(s): LEFT HEART CATHETERIZATION WITH CORONARY ANGIOGRAM (N/A) as a surgical intervention .  The patient's history has been reviewed, patient examined, no change in status, stable for surgery.  I have reviewed the patient's chart and labs.  Questions were answered to the patient's satisfaction.     Alin Chavira A

## 2014-04-10 ENCOUNTER — Telehealth (HOSPITAL_COMMUNITY): Payer: Self-pay | Admitting: *Deleted

## 2014-04-10 NOTE — Telephone Encounter (Signed)
-----   Message from Quintella Reichertraci R Turner, MD sent at 04/10/2014  8:04 AM EST ----- Regarding: RE: ok to proceed with cardiac rehab yes ----- Message -----    From: Chelsea Ausarlette B Carlton, RN    Sent: 04/09/2014   4:03 PM      To: Quintella Reichertraci R Turner, MD Subject: ok to proceed with cardiac rehab                Dr. Mayford Knifeurner  Pt desires to participate in cardiac rehab s/p 1/4 stemi and 1/6 Des Lad.  Pt re hospitalized due to concerning chest discomfort on exertion.  Subsequent cath 1/28 showed widely patent stent, pt discharged later that day.  He will see Marvis RepressChris Berg in the office on Monday 2/15 in follow up.   Okay to proceed with cardiac rehab?  Thanks for your input PepsiCoCarlette Carlton RN

## 2014-04-13 ENCOUNTER — Encounter: Payer: BC Managed Care – PPO | Admitting: Nurse Practitioner

## 2014-04-16 ENCOUNTER — Encounter (HOSPITAL_COMMUNITY)
Admission: RE | Admit: 2014-04-16 | Discharge: 2014-04-16 | Disposition: A | Discharge status: Home / Self Care | Payer: BLUE CROSS/BLUE SHIELD | Source: Ambulatory Visit | Attending: Cardiology | Admitting: Cardiology

## 2014-04-16 NOTE — Progress Notes (Signed)
Cardiac Rehab Medication Review by a Pharmacist  Does the patient  feel that his/her medications are working for him/her?  yes  Has the patient been experiencing any side effects to the medications prescribed?  Yes - upset stomach and diarrhea with atorvastatin  Does the patient measure his/her own blood pressure or blood glucose at home?  no   Does the patient have any problems obtaining medications due to transportation or finances?   no  Understanding of regimen: good Understanding of indications: good Potential of compliance: good    Pharmacist comments:  44 yo M presents to cardiac rehab orientation walking unassisted. Patient has good understanding of the medications he is taking and why he is taking them. He is only experiencing one side effect and that is from his atorvastatin. He gets a significant upset stomach accompanied with diarrhea and his MD has requested for him to take a half tablet in the morning and half in the evening. He says it sometimes helps but he is still experiencing the adverse effect. I explained that he may need to try another medication in that drug class, such as Crestor. All questions were answered during our time.  Thank you for allowing pharmacy to be part of this patient's care team  Febe Champa M. Sevon Rotert, Pharm.D Clinical Pharmacy Resident Pager: [phone] 04/16/2014 .9:01 AM

## 2014-04-20 ENCOUNTER — Encounter (HOSPITAL_COMMUNITY): Payer: BLUE CROSS/BLUE SHIELD

## 2014-04-21 ENCOUNTER — Ambulatory Visit (HOSPITAL_COMMUNITY): Payer: BLUE CROSS/BLUE SHIELD | Attending: Cardiology

## 2014-04-21 ENCOUNTER — Other Ambulatory Visit: Payer: Self-pay | Admitting: Physician Assistant

## 2014-04-21 ENCOUNTER — Ambulatory Visit (INDEPENDENT_AMBULATORY_CARE_PROVIDER_SITE_OTHER): Payer: BLUE CROSS/BLUE SHIELD | Admitting: Physician Assistant

## 2014-04-21 ENCOUNTER — Encounter: Payer: Self-pay | Admitting: Physician Assistant

## 2014-04-21 VITALS — BP 150/90 | HR 65 | Ht 67.0 in | Wt 150.0 lb

## 2014-04-21 DIAGNOSIS — R0602 Shortness of breath: Secondary | ICD-10-CM

## 2014-04-21 DIAGNOSIS — I10 Essential (primary) hypertension: Secondary | ICD-10-CM

## 2014-04-21 DIAGNOSIS — R071 Chest pain on breathing: Secondary | ICD-10-CM

## 2014-04-21 DIAGNOSIS — R079 Chest pain, unspecified: Secondary | ICD-10-CM

## 2014-04-21 LAB — CBC
HEMATOCRIT: 48.2 % (ref 39.0–52.0)
Hemoglobin: 16 g/dL (ref 13.0–17.0)
MCHC: 33.2 g/dL (ref 30.0–36.0)
MCV: 85.6 fl (ref 78.0–100.0)
PLATELETS: 343 10*3/uL (ref 150.0–400.0)
RBC: 5.63 Mil/uL (ref 4.22–5.81)
RDW: 14.8 % (ref 11.5–15.5)
WBC: 7.6 10*3/uL (ref 4.0–10.5)

## 2014-04-21 LAB — BASIC METABOLIC PANEL
BUN: 11 mg/dL (ref 6–23)
CALCIUM: 9.8 mg/dL (ref 8.4–10.5)
CHLORIDE: 99 meq/L (ref 96–112)
CO2: 29 meq/L (ref 19–32)
CREATININE: 1.19 mg/dL (ref 0.40–1.50)
GFR: 85.21 mL/min (ref >60.00)
Glucose, Bld: 111 mg/dL — ABNORMAL HIGH (ref 70–99)
Potassium: 3.8 mEq/L (ref 3.5–5.1)
Sodium: 134 mEq/L — ABNORMAL LOW (ref 135–145)

## 2014-04-21 LAB — TSH: TSH: 0.33 u[IU]/mL — ABNORMAL LOW (ref 0.35–4.50)

## 2014-04-21 LAB — BRAIN NATRIURETIC PEPTIDE: PRO B NATRI PEPTIDE: 11 pg/mL (ref 0.0–100.0)

## 2014-04-21 LAB — CARDIAC PANEL
CK MB: 1.1 ng/mL (ref 0.3–4.0)
RELATIVE INDEX: 1 calc (ref 0.0–2.5)
Total CK: 111 U/L (ref 7–232)

## 2014-04-21 LAB — TROPONIN I: TNIDX: 0.01 ug/l (ref 0.00–0.06)

## 2014-04-21 MED ORDER — CLOPIDOGREL BISULFATE 75 MG PO TABS: 75.0000 mg | ORAL_TABLET | Freq: Every day | ORAL | Status: DC

## 2014-04-21 MED ORDER — PANTOPRAZOLE SODIUM 40 MG PO TBEC
40.0000 mg | DELAYED_RELEASE_TABLET | Freq: Every day | ORAL | Status: DC
Start: 2014-04-21 — End: 2014-06-08

## 2014-04-21 NOTE — Progress Notes (Addendum)
Cardiology Office Note   Date:  04/21/2014   ID:  Darren [name], DOB 1970-04-20, MRN [id number]  PCP:  No primary care provider on file.  Cardiologist:  Dr. Mayford Knife   Chest pain.     History of Present Illness: Darren [name] is a 44 y.o. male with a history of HTN and CAD s/p DES  to mLAD (02/2014) who presents to the clinic today for post hospital follow up.   He presetned to [facility] on 03/04/2014 with chest pain and NSTEMI. He was cathed and had ulcerated plaque in the mid LAD that was treated with PCI/DES x 1. Echo revealed LVEF 60-65%, severe concentric LVH, normal wall motion, diastolicdysfunction, normal LV filling pressure, moderate AI, trivial MR. He was discharged on aspirin and Brilinta which according to the patient he has been compliant with. He came back to [facility] office for follow-up on 03/25/2014, during interview, he complained of chest discomfort would be relieved with rest and nitroglycerin. The symptom was concerning for stent rethrombosis, he was admitted from the office for cardiac catheterization. His serial troponins were negative. He underwent a scheduled cardiac catheterization on 03/26/2014 which revealed EF 50-55%, without focal segmental wall motion abnormality, widely patent mid LAD stent without evidence of restenosis.  Today he presents for post hospital follow up. He has been feeling extremely sluggish and SOB with minimal exertion. 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 He also had to lay in bed almost all weekend due to generally not feeling well and chest pain. Also had neck tightness similar to prior to his stent placement. He also has been having nausea and diarrhea that he thinks is being caused by one of his medications. He has not been eating well due to this. He has lost about 7 lbs since his stent placement. He also has severe anxiety for  which he takes valium and lexapro. He is having 1/10 pain today. It feels like a sharp pain in left chest that is worse with palpation. No SOB or nausea. No dizziness, LE swelling, palpitations, orthopnea or PND. No blood in stool or urine. He has quit smoking.    Past Medical History  Diagnosis Date  . Hypertension   . Tobacco abuse   . Coronary artery disease 03/24/2014    SINGLE VESSEL CAD    Past Surgical History  Procedure Laterality Date  . Left heart catheterization with coronary angiogram Bilateral 03/04/2014    Procedure: LEFT HEART CATHETERIZATION WITH CORONARY ANGIOGRAM;  Surgeon: Kathleene Hazel, MD;  Location[facility] LAB;  Service: Cardiovascular;  Laterality: Bilateral;  . Cardiac catheterization  03/04/2014  . Left heart catheterization with coronary angiogram N/A 03/26/2014    Procedure: LEFT HEART CATHETERIZATION WITH CORONARY ANGIOGRAM;  Surgeon: Lennette Bihari, MD;  Location: [facility] CATH LAB;  Service: Cardiovascular;  Laterality: N/A;     Current Outpatient Prescriptions  Medication Sig Dispense Refill  . aspirin EC 81 MG EC tablet Take 1 tablet (81 mg total) by mouth daily.    Marland Kitchen atorvastatin (LIPITOR) 80 MG tablet Take 1 tablet (80 mg total) by mouth daily at 6 PM. 30 tablet 5  . diazepam (VALIUM) 5 MG tablet Take 5 mg by mouth daily as needed for anxiety.   0  . escitalopram (LEXAPRO) 10 MG tablet Take 10 mg by mouth daily.  0  .  hydrochlorothiazide (MICROZIDE) 12.5 MG capsule Take 1 capsule (12.5 mg total) by mouth daily. 30 capsule 5  . metoprolol tartrate (LOPRESSOR) 25 MG tablet Take 1 tablet (25 mg total) by mouth 2 (two) times daily. 60 tablet 5  . nitroGLYCERIN (NITROSTAT) 0.4 MG SL tablet Place 1 tablet (0.4 mg total) under the tongue every 5 (five) minutes x 3 doses as needed for chest pain. 25 tablet 12  . clopidogrel (PLAVIX) 75 MG tablet Take 1 tablet (75 mg total) by mouth daily. 30 tablet 5  . pantoprazole (PROTONIX) 40 MG tablet Take 1 tablet (40 mg  total) by mouth daily. 60 tablet 5   No current facility-administered medications for this visit.    Allergies:   Hydrocodone    Social History:  The patient  reports that he has been smoking.  He has never used smokeless tobacco. He reports that he drinks alcohol. He reports that he uses illicit drugs.   Family History:  The patient's family history includes Diabetes in his other; Heart attack in his other; Hypertension in his father, mother, and other.    ROS:  Please see the history of present illness.   Otherwise, review of systems are positive for none.   All other systems are reviewed and negative.    PHYSICAL EXAM: VS:  BP 150/90 mmHg  Pulse 65  Ht  (1.702 m)  Wt 150 lb (68.04 kg)  BMI 23.49 kg/m2 , BMI Body mass index is 23.49 kg/(m^2). GEN: Well nourished, well developed, in no acute distress HEENT: normal Neck: no JVD, carotid bruits, or masses Cardiac: RRR; no murmurs, rubs, or gallops,no edema  Respiratory:  clear to auscultation bilaterally, normal work of breathing GI: soft, nontender, nondistended, + BS MS: no deformity or atrophy Skin: warm and dry, no rash Neuro:  Strength and sensation are intact Psych: euthymic mood, full affect   EKG:  EKG is ordered today. The ekg ordered today demonstrates new ST depression, TWI in inferior leads.   Recent Labs: 03/03/2014: B Natriuretic Peptide 13.4; TSH 0.957 03/25/2014: ALT 22; BUN 10; Creatinine 1.06; Hemoglobin 14.1; Magnesium 2.0; Platelets 266; Potassium 3.5; Sodium 136    Lipid Panel    Component Value Date/Time   CHOL 152 03/04/2014 0316   TRIG 100 03/04/2014 0316   HDL 44 03/04/2014 0316   CHOLHDL 3.5 03/04/2014 0316   VLDL 20 03/04/2014 0316   LDLCALC 88 03/04/2014 0316      Wt Readings from Last 3 Encounters:  04/21/14 150 lb (68.04 kg)  04/16/14 151 lb 3.8 oz (68.6 kg)  03/25/14 154 lb 1.9 oz (69.908 kg)      Other studies Reviewed: Additional studies/ records that were reviewed today  include: LHC 03/26/14 Review of the above records demonstrates:  -- [facility] 03/26/14. Low normal LV function with an ejection fraction of 50-55% without focal segmental wall motion abnormalities. Normal coronary arteries with evidence for a widely patent previously placed mid LAD stent without evidence for restenosis.   ASSESSMENT AND PLAN:  Darren [name] is a 44 y.o. male with a history of HTN and CAD s/p DES  to mLAD (02/2014) who presents to the clinic today for post hospital follow up.   Recurrent chest pain- associated with some ECG changes in inferior leads. These were present on a 03/02/14 EKG but had resolved on all subsequent tracings. Likely due to LVH but will do a stat limited 2D ECHO in the office today, POC troponin, CK-MB, BNP, BMET, TSH,  CBC.  -- Continue DAPT with ASA/Plavix (d/c Brilinta due to possible GI side effects), metoprolol and statin.   Nausea and diarrhea/SOB- these can all be related to Brilinta. Will d/c Britlinta and start plavix 75mg  qd. Will add protonix 40mg  BID.   HTN- BP 150/90 mmg Hg, HR 57. Continue metoprolol 25mg  BID.   Tobacco abuse- he has quit  Current medicines are reviewed at length with the patient today.  The patient has concerns regarding medicines. He thinks one of his meds is causing GI upset.   The following changes have been made:  Add protonix 40mg  BID and switch Brilinta to plavix 75mg  qd.   Labs/ tests ordered today include:  Orders Placed This Encounter  Procedures  . Basic Metabolic Panel (BMET)  . CBC  . TSH  . B Nat Peptide  . Troponin I  . Cardiac panel  . EKG 12-Lead  . 2D Echocardiogram with contrast     Disposition:   FU with APP/ Dr. Mayford Knifeurner in 2 weeks  Signed, Janetta HoraHOMPSON, Rozanna Cormany R, PA-C  04/21/2014 12:40 PM    [facility] [address], [place], [place]  [address] Phone: [phone]) [phone]; Fax: [phone]

## 2014-04-21 NOTE — Progress Notes (Signed)
Limited 2D Echo completed. 04/21/2014

## 2014-04-21 NOTE — Patient Instructions (Addendum)
START TAKING PLAVIX 75 MG ONCE A DAY   START TAKING PROTONIX 40MG    STOP TAKING BIRLINTA  90MG     LABS TODAY BMET CBC TSH BNP AND TROPONIN    Your physician has requested that you have an echocardiogram. TODAY @ 3 PM. Echocardiography is a painless test that uses sound waves to create images of your heart. It provides your doctor with information about the size and shape of your heart and how well your heart's chambers and valves are working. This procedure takes approximately one hour. There are no restrictions for this procedure.

## 2014-04-22 ENCOUNTER — Telehealth (HOSPITAL_COMMUNITY): Payer: Self-pay | Admitting: *Deleted

## 2014-04-22 ENCOUNTER — Encounter (HOSPITAL_COMMUNITY): Payer: BLUE CROSS/BLUE SHIELD

## 2014-04-24 ENCOUNTER — Encounter (HOSPITAL_COMMUNITY): Payer: BLUE CROSS/BLUE SHIELD

## 2014-04-27 ENCOUNTER — Encounter (HOSPITAL_COMMUNITY): Payer: BLUE CROSS/BLUE SHIELD

## 2014-04-29 ENCOUNTER — Encounter (HOSPITAL_COMMUNITY): Payer: BLUE CROSS/BLUE SHIELD

## 2014-05-01 ENCOUNTER — Encounter (HOSPITAL_COMMUNITY): Payer: BLUE CROSS/BLUE SHIELD

## 2014-05-04 ENCOUNTER — Encounter (HOSPITAL_COMMUNITY): Payer: BLUE CROSS/BLUE SHIELD

## 2014-05-05 ENCOUNTER — Telehealth (HOSPITAL_COMMUNITY): Payer: Self-pay | Admitting: *Deleted

## 2014-05-06 ENCOUNTER — Encounter (HOSPITAL_COMMUNITY): Payer: BLUE CROSS/BLUE SHIELD

## 2014-05-08 ENCOUNTER — Encounter: Payer: Self-pay | Admitting: Cardiology

## 2014-05-08 ENCOUNTER — Encounter (HOSPITAL_COMMUNITY): Payer: BLUE CROSS/BLUE SHIELD

## 2014-05-08 ENCOUNTER — Ambulatory Visit (INDEPENDENT_AMBULATORY_CARE_PROVIDER_SITE_OTHER): Payer: BLUE CROSS/BLUE SHIELD | Admitting: Cardiology

## 2014-05-08 VITALS — BP 120/48 | HR 50 | Ht 67.0 in | Wt 156.2 lb

## 2014-05-08 DIAGNOSIS — I10 Essential (primary) hypertension: Secondary | ICD-10-CM

## 2014-05-08 DIAGNOSIS — R079 Chest pain, unspecified: Secondary | ICD-10-CM

## 2014-05-08 DIAGNOSIS — I25810 Atherosclerosis of coronary artery bypass graft(s) without angina pectoris: Secondary | ICD-10-CM

## 2014-05-08 MED ORDER — METOPROLOL TARTRATE 25 MG PO TABS: 12.5000 mg | ORAL_TABLET | Freq: Two times a day (BID) | ORAL | Status: DC

## 2014-05-08 NOTE — Progress Notes (Addendum)
Cardiology Office Note   Date:  05/08/2014   ID:  Darren [name], DOB 1971-01-16, MRN [id number]  PCP:  No primary care provider on file.  Cardiologist:   Quintella ReichertURNER,Ajee Heasley R, MD   Chief Complaint  Patient presents with  . Coronary Artery Disease  . Hypertension  . Bradycardia      History of Present Illness: Darren [name] is a 44 y.o. male with a history of HTN and CAD s/p DES to mLAD (02/2014) who presents to the clinic today for follow up.   He presented to [facility] on 03/04/2014 with chest pain and NSTEMI. He was cathed and had ulcerated plaque in the mid LAD that was treated with PCI/DES x 1. Echo revealed LVEF 60-65%, severe concentric LVH, normal wall motion, diastolicdysfunction, normal LV filling pressure, moderate AI, trivial MR. He was discharged on aspirin and Brilinta which according to the patient he has been compliant with. He came back to [facility] office for follow-up on 03/25/2014, during interview, he complained of chest discomfort would be relieved with rest and nitroglycerin. The symptom was concerning for stent rethrombosis, he was admitted from the office for cardiac catheterization. His serial troponins were negative. He underwent a scheduled cardiac catheterization on 03/26/2014 which revealed EF 50-55%, without focal segmental wall motion abnormality, widely patent mid LAD stent without evidence of restenosis and no other evidence of CAD.    He presented back for follow up with extender 2/23. He had been feeling extremely sluggish and SOB with minimal exertion. 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 He also had to lay in bed almost all weekend due to generally not feeling well and chest pain. Also had neck tightness similar to prior to his stent placement. He also has been having nausea and diarrhea that he thinks is being caused by one of his medications. He had not  been eating well due to this. He had lost about 7 lbs since his stent placement. He also has severe anxiety for which he takes valium and lexapro. He was having 1/10 pain that day. It felt like a sharp pain in left chest that was worse with palpation. No SOB or nausea. No dizziness, LE swelling, palpitations, orthopnea or PND. No blood in stool or urine. He has quit smoking. It was felt that his CP was nonexertional.  He continue to have a sharp pain in his chest and has left arm pain.  He says it feels that he is having panic attacks.  His significant other states that he has a lot of stress and anxiety and thinks he is depressed.  His PCP increased his Lexapro recently.  He says that the Diazepam that he takes twice daily does ease the chest pain and calms him down. He says that on the days that he gets chest pain he is very stressed at work and cannot concentrate due to stress and anxiety.  On the days that he is not stressed he does not have CP.  He says that the CP he has now is completely different than when he had his MI and has not had any associated symptoms of diaphoresis or nausea.     Past Medical History  Diagnosis Date  . Hypertension   . Tobacco abuse   . Coronary artery disease 03/24/2014    SINGLE VESSEL CAD    Past Surgical  History  Procedure Laterality Date  . Left heart catheterization with coronary angiogram Bilateral 03/04/2014    Procedure: LEFT HEART CATHETERIZATION WITH CORONARY ANGIOGRAM;  Surgeon: Kathleene Hazel, MD;  Location: [facility] CATH LAB;  Service: Cardiovascular;  Laterality: Bilateral;  . Cardiac catheterization  03/04/2014  . Left heart catheterization with coronary angiogram N/A 03/26/2014    Procedure: LEFT HEART CATHETERIZATION WITH CORONARY ANGIOGRAM;  Surgeon: Lennette Bihari, MD;  Location: [facility] CATH LAB;  Service: Cardiovascular;  Laterality: N/A;     Current Outpatient Prescriptions  Medication Sig Dispense Refill  . aspirin EC 81 MG EC tablet Take 1  tablet (81 mg total) by mouth daily.    Marland Kitchen atorvastatin (LIPITOR) 80 MG tablet Take 1 tablet (80 mg total) by mouth daily at 6 PM. 30 tablet 5  . clopidogrel (PLAVIX) 75 MG tablet Take 1 tablet (75 mg total) by mouth daily. 30 tablet 5  . diazepam (VALIUM) 5 MG tablet Take 5 mg by mouth daily as needed for anxiety.   0  . escitalopram (LEXAPRO) 20 MG tablet Take 20 mg by mouth daily.    . hydrochlorothiazide (MICROZIDE) 12.5 MG capsule Take 1 capsule (12.5 mg total) by mouth daily. 30 capsule 5  . metoprolol tartrate (LOPRESSOR) 25 MG tablet Take 0.5 tablets (12.5 mg total) by mouth 2 (two) times daily. 60 tablet 5  . nitroGLYCERIN (NITROSTAT) 0.4 MG SL tablet Place 1 tablet (0.4 mg total) under the tongue every 5 (five) minutes x 3 doses as needed for chest pain. 25 tablet 12  . pantoprazole (PROTONIX) 40 MG tablet Take 1 tablet (40 mg total) by mouth daily. 60 tablet 5   No current facility-administered medications for this visit.    Allergies:   Hydrocodone    Social History:  The patient  reports that he has been smoking.  He has never used smokeless tobacco. He reports that he drinks alcohol. He reports that he uses illicit drugs.   Family History:  The patient's family history includes Diabetes in his other; Heart attack in his other; Hypertension in his father, mother, and other.    ROS:  Please see the history of present illness.   Otherwise, review of systems are positive for headaches and fatigue.   All other systems are reviewed and negative.    PHYSICAL EXAM: VS:  BP 120/48 mmHg  Pulse 50  Ht  (1.702 m)  Wt 156 lb 3.2 oz (70.852 kg)  BMI 24.46 kg/m2 , BMI Body mass index is 24.46 kg/(m^2). GEN: Well nourished, well developed, in no acute distress HEENT: normal Neck: no JVD, carotid bruits, or masses Cardiac: RRR; no murmurs, rubs, or gallops,no edema  Respiratory:  clear to auscultation bilaterally, normal work of breathing GI: soft, nontender, nondistended, +  BS MS: no deformity or atrophy Skin: warm and dry, no rash Neuro:  Strength and sensation are intact Psych: euthymic mood, full affect   EKG:  EKG is ordered today. The ekg ordered today demonstrates sinus bradycardia at 50bpm with LVH by voltage with repolarization abnormality   Recent Labs: 03/03/2014: B Natriuretic Peptide 13.4 03/25/2014: ALT 22; Magnesium 2.0 04/21/2014: BUN 11; Creatinine 1.19; Hemoglobin 16.0; Platelets 343.0; Potassium 3.8; Pro B Natriuretic peptide (BNP) 11.0; Sodium 134*; TSH 0.33*    Lipid Panel    Component Value Date/Time   CHOL 152 03/04/2014 0316   TRIG 100 03/04/2014 0316   HDL 44 03/04/2014 0316   CHOLHDL 3.5 03/04/2014 0316   VLDL 20  03/04/2014 0316   LDLCALC 88 03/04/2014 0316      Wt Readings from Last 3 Encounters:  05/08/14 156 lb 3.2 oz (70.852 kg)  04/21/14 150 lb (68.04 kg)  04/16/14 151 lb 3.8 oz (68.6 kg)     ASSESSMENT AND PLAN:  Darren [name] is a 44 y.o. male with a history of HTN and CAD s/p DES to mLAD (02/2014) who presents to the clinic today for post hospital follow up.   Recurrent chest pain- associated with some ECG changes in inferior leads likely due to LVH. 2D ECHO in the office showed normal LVF and POC troponin, CK-MB, BNP, BMET, CBC were normal.  I have tried to reassure patient that I do not think his pain is related to underlying coronary ischemia.  He had a repeat cath for same pain showing patent stent and no other disease.  He has a lot of stress and depression which I think are driving his symptoms.  He is followed by his PCP for depression and anxiety and I recommended consider counseling.   -- Continue DAPT with ASA/Plavix, metoprolol and statin.   Bradycardia which may be attributing to his fatigue. I have recommended that he cut back on his lopressor to  1/2 tablet BID  Nausea and diarrhea/SOB- resolved after switching to Plavix from Brilinta.   HTN- controlled  Tobacco abuse- he has quit  TSH -  slightly reduced below normal.  This could explain fatigue and weight loss.  I will refer this back to his PCP to evaluate further  Moderate AI - repeat echo in 1 year    Current medicines are reviewed at length with the patient today.  The patient does not have concerns regarding medicines.  The following changes have been made:  no change  Labs/ tests ordered today include: None   Orders Placed This Encounter  Procedures  . EKG 12-Lead     Disposition:   FU with me in 3 months   Signed, Quintella Reichert, MD  05/08/2014 4:34 PM    [facility] [address] [place], [place], [place]  [address] Phone: [phone]; Fax: [phone]

## 2014-05-08 NOTE — Patient Instructions (Signed)
Your physician has recommended you make the following change in your medication: Decrease Metoprolol ( 12.5 mg ) twice a day.  Sent into [facility] [address]   Your physician recommends that you keep your scheduled  follow-up appointment with Dr. Sherlyn Lickurner   You will be contacted by our office in one year to repeat echo

## 2014-05-11 ENCOUNTER — Encounter (HOSPITAL_COMMUNITY): Payer: BLUE CROSS/BLUE SHIELD

## 2014-05-13 ENCOUNTER — Encounter (HOSPITAL_COMMUNITY): Payer: BLUE CROSS/BLUE SHIELD

## 2014-05-15 ENCOUNTER — Encounter (HOSPITAL_COMMUNITY): Payer: BLUE CROSS/BLUE SHIELD

## 2014-05-15 DIAGNOSIS — I2510 Atherosclerotic heart disease of native coronary artery without angina pectoris: Secondary | ICD-10-CM | POA: Insufficient documentation

## 2014-05-15 DIAGNOSIS — I10 Essential (primary) hypertension: Secondary | ICD-10-CM | POA: Insufficient documentation

## 2014-05-18 ENCOUNTER — Encounter (HOSPITAL_COMMUNITY): Payer: BLUE CROSS/BLUE SHIELD

## 2014-05-18 ENCOUNTER — Telehealth (HOSPITAL_COMMUNITY): Payer: Self-pay | Admitting: *Deleted

## 2014-05-20 ENCOUNTER — Encounter (HOSPITAL_COMMUNITY)
Admission: RE | Admit: 2014-05-20 | Discharge: 2014-05-20 | Disposition: A | Discharge status: Home / Self Care | Payer: BLUE CROSS/BLUE SHIELD | Source: Ambulatory Visit | Attending: Cardiology | Admitting: Cardiology

## 2014-05-20 DIAGNOSIS — I10 Essential (primary) hypertension: Secondary | ICD-10-CM | POA: Diagnosis not present

## 2014-05-20 DIAGNOSIS — I2510 Atherosclerotic heart disease of native coronary artery without angina pectoris: Secondary | ICD-10-CM | POA: Diagnosis not present

## 2014-05-20 NOTE — Progress Notes (Addendum)
Pt started cardiac rehab today.  Pt tolerated light exercise without difficulty. Telemetry rhythm Sinus. Vital signs stable. Mr Katrinka BlazingSmith says he has had some depression and is currently taking an antidepressant. Will continue to monitor the patient throughout  the program. PHQ=1. Willaim short term goals are to increase his stamina. Long term goals are to be back in full force good shape.

## 2014-05-22 ENCOUNTER — Encounter (HOSPITAL_COMMUNITY)
Admission: RE | Admit: 2014-05-22 | Discharge: 2014-05-22 | Disposition: A | Discharge status: Home / Self Care | Payer: BLUE CROSS/BLUE SHIELD | Source: Ambulatory Visit | Attending: Cardiology | Admitting: Cardiology

## 2014-05-22 DIAGNOSIS — I2510 Atherosclerotic heart disease of native coronary artery without angina pectoris: Secondary | ICD-10-CM | POA: Diagnosis not present

## 2014-05-22 NOTE — Progress Notes (Signed)
Reviewed [name] assessment today. Darren [name] says he has been anxious about coming to exercise but felt better today. Darren [name] is here today with 3 of his children and the children's mother. Darren [name] says they came to support him. Darren [name] says he has been dealing with the death of family and friends and is still having difficulty dealing with the death of his mother who passed away 15 year ago. [facility] to see of Darren [name] would benefit from counseling. [facility] [name] [facility] The office is closed today for the holiday. Will continue to monitor the patient throughout the program.

## 2014-05-25 ENCOUNTER — Encounter (HOSPITAL_COMMUNITY): Payer: BLUE CROSS/BLUE SHIELD

## 2014-05-27 ENCOUNTER — Encounter (HOSPITAL_COMMUNITY): Admission: RE | Admit: 2014-05-27 | Payer: BLUE CROSS/BLUE SHIELD | Source: Ambulatory Visit

## 2014-05-29 ENCOUNTER — Encounter (HOSPITAL_COMMUNITY): Payer: BLUE CROSS/BLUE SHIELD

## 2014-06-01 ENCOUNTER — Encounter (HOSPITAL_COMMUNITY): Payer: Self-pay | Admitting: Emergency Medicine

## 2014-06-01 ENCOUNTER — Emergency Department (HOSPITAL_COMMUNITY): Payer: BLUE CROSS/BLUE SHIELD

## 2014-06-01 ENCOUNTER — Encounter (HOSPITAL_COMMUNITY): Payer: BLUE CROSS/BLUE SHIELD

## 2014-06-01 ENCOUNTER — Emergency Department (HOSPITAL_COMMUNITY)
Admission: EM | Admit: 2014-06-01 | Discharge: 2014-06-02 | Disposition: A | Discharge status: Other Acute Inpatient Hospital | Payer: BLUE CROSS/BLUE SHIELD | Attending: Emergency Medicine | Admitting: Emergency Medicine

## 2014-06-01 DIAGNOSIS — Z79899 Other long term (current) drug therapy: Secondary | ICD-10-CM | POA: Insufficient documentation

## 2014-06-01 DIAGNOSIS — R45851 Suicidal ideations: Secondary | ICD-10-CM | POA: Diagnosis not present

## 2014-06-01 DIAGNOSIS — R0602 Shortness of breath: Secondary | ICD-10-CM | POA: Diagnosis not present

## 2014-06-01 DIAGNOSIS — Z7982 Long term (current) use of aspirin: Secondary | ICD-10-CM | POA: Diagnosis not present

## 2014-06-01 DIAGNOSIS — Z7902 Long term (current) use of antithrombotics/antiplatelets: Secondary | ICD-10-CM | POA: Diagnosis not present

## 2014-06-01 DIAGNOSIS — I10 Essential (primary) hypertension: Secondary | ICD-10-CM | POA: Insufficient documentation

## 2014-06-01 DIAGNOSIS — R079 Chest pain, unspecified: Secondary | ICD-10-CM | POA: Insufficient documentation

## 2014-06-01 DIAGNOSIS — Z9889 Other specified postprocedural states: Secondary | ICD-10-CM | POA: Insufficient documentation

## 2014-06-01 DIAGNOSIS — I2510 Atherosclerotic heart disease of native coronary artery without angina pectoris: Secondary | ICD-10-CM | POA: Insufficient documentation

## 2014-06-01 DIAGNOSIS — R110 Nausea: Secondary | ICD-10-CM | POA: Insufficient documentation

## 2014-06-01 HISTORY — DX: Anxiety disorder, unspecified: F41.9

## 2014-06-01 LAB — COMPREHENSIVE METABOLIC PANEL
ALT: 26 U/L (ref 0–53)
AST: 38 U/L — AB (ref 0–37)
Albumin: 4 g/dL (ref 3.5–5.2)
Alkaline Phosphatase: 74 U/L (ref 39–117)
Anion gap: 11 (ref 5–15)
BILIRUBIN TOTAL: 1 mg/dL (ref 0.3–1.2)
BUN: 10 mg/dL (ref 6–23)
CO2: 27 mmol/L (ref 19–32)
Calcium: 9.2 mg/dL (ref 8.4–10.5)
Chloride: 96 mmol/L (ref 96–112)
Creatinine, Ser: 1.05 mg/dL (ref 0.50–1.35)
GFR calc Af Amer: >90 mL/min (ref >90)
GFR, EST NON AFRICAN AMERICAN: 85 mL/min — AB (ref >90)
Glucose, Bld: 124 mg/dL — ABNORMAL HIGH (ref 70–99)
Potassium: 3.5 mmol/L (ref 3.5–5.1)
SODIUM: 134 mmol/L — AB (ref 135–145)
Total Protein: 7.4 g/dL (ref 6.0–8.3)

## 2014-06-01 LAB — CBC WITH DIFFERENTIAL/PLATELET
BASOS PCT: 1 % (ref 0–1)
Basophils Absolute: 0 10*3/uL (ref 0.0–0.1)
EOS ABS: 0.1 10*3/uL (ref 0.0–0.7)
EOS PCT: 2 % (ref 0–5)
HEMATOCRIT: 41.7 % (ref 39.0–52.0)
Hemoglobin: 14.4 g/dL (ref 13.0–17.0)
Lymphocytes Relative: 21 % (ref 12–46)
Lymphs Abs: 1.5 10*3/uL (ref 0.7–4.0)
MCH: 29.3 pg (ref 26.0–34.0)
MCHC: 34.5 g/dL (ref 30.0–36.0)
MCV: 84.8 fL (ref 78.0–100.0)
Monocytes Absolute: 0.6 10*3/uL (ref 0.1–1.0)
Monocytes Relative: 9 % (ref 3–12)
Neutro Abs: 5 10*3/uL (ref 1.7–7.7)
Neutrophils Relative %: 67 % (ref 43–77)
Platelets: 295 10*3/uL (ref 150–400)
RBC: 4.92 MIL/uL (ref 4.22–5.81)
RDW: 15.1 % (ref 11.5–15.5)
WBC: 7.3 10*3/uL (ref 4.0–10.5)

## 2014-06-01 LAB — ETHANOL: Alcohol, Ethyl (B): <5 mg/dL (ref 0–9)

## 2014-06-01 LAB — I-STAT TROPONIN, ED: TROPONIN I, POC: 0.01 ng/mL (ref 0.00–0.08)

## 2014-06-01 LAB — RAPID URINE DRUG SCREEN, HOSP PERFORMED
Amphetamines: NOT DETECTED
Barbiturates: NOT DETECTED
Benzodiazepines: POSITIVE — AB
COCAINE: NOT DETECTED
OPIATES: NOT DETECTED
TETRAHYDROCANNABINOL: POSITIVE — AB

## 2014-06-01 NOTE — ED Provider Notes (Signed)
CSN: [id number]     Arrival date & time 06/01/14  1527 History   First MD Initiated Contact with Patient 06/01/14 2030     Chief Complaint  Patient presents with  . Suicidal     (Consider location/radiation/quality/duration/timing/severity/associated sxs/prior Treatment) The history is provided by the patient. No language interpreter was used.  Mr. Dini is a 44 y.o black male with a history of htn, anxiety, CAD with recent heart cath in January of this year who presents for SI, nausea, and chest pain.  He states the chest pain has been an intermittent heaviness for the past 2 days.  He feels anxious and needs to talk to someone about his problems.  He states he though of several ways to kill himself last night but could not state how he would do it.  He says he is married with three children and he also has a child with his girlfriend.  He is upset over relationship problems and says, "sometimes I just want to end it all."  He had SI five years ago and was admitted but did not try to commit suicide. He called behavioral health earlier today and they told him to come to the ED.  He denies any HI or visual/auditory hallucination.   Past Medical History  Diagnosis Date  . Hypertension   . Tobacco abuse   . Coronary artery disease 03/24/2014    SINGLE VESSEL CAD  . Anxiety    Past Surgical History  Procedure Laterality Date  . Left heart catheterization with coronary angiogram Bilateral 03/04/2014    Procedure: LEFT HEART CATHETERIZATION WITH CORONARY ANGIOGRAM;  Surgeon: Kathleene Hazel, MD;  Location: [facility] CATH LAB;  Service: Cardiovascular;  Laterality: Bilateral;  . Cardiac catheterization  03/04/2014  . Left heart catheterization with coronary angiogram N/A 03/26/2014    Procedure: LEFT HEART CATHETERIZATION WITH CORONARY ANGIOGRAM;  Surgeon: Lennette Bihari, MD;  Location: [facility] CATH LAB;  Service: Cardiovascular;  Laterality: N/A;   Family History  Problem Relation Age of Onset  .  Hypertension Mother   . Hypertension Father   . Hypertension Other   . Diabetes Other   . Heart attack Other    History  Substance Use Topics  . Smoking status: Current Every Day Smoker -- 0.50 packs/day for 20 years  . Smokeless tobacco: Never Used  . Alcohol Use: 0.0 oz/week    0 Standard drinks or equivalent per week     Comment: occ (rare)    Review of Systems  Constitutional: Negative for fever.  Respiratory: Positive for shortness of breath. Negative for wheezing.   Cardiovascular: Negative for palpitations.  Gastrointestinal: Positive for nausea. Negative for vomiting.  All other systems reviewed and are negative.     Allergies  Hydrocodone  Home Medications   Prior to Admission medications   Medication Sig Start Date End Date Taking? Authorizing Provider  aspirin EC 81 MG EC tablet Take 1 tablet (81 mg total) by mouth daily. 03/05/14  Yes Dwana Melena, PA-C  atorvastatin (LIPITOR) 80 MG tablet Take 1 tablet (80 mg total) by mouth daily at 6 PM. 03/05/14  Yes Dwana Melena, PA-C  clopidogrel (PLAVIX) 75 MG tablet Take 1 tablet (75 mg total) by mouth daily. 04/21/14  Yes Ok Anis, NP  diazepam (VALIUM) 5 MG tablet Take 5 mg by mouth daily as needed for anxiety.  03/12/14  Yes Historical Provider, MD  escitalopram (LEXAPRO) 20 MG tablet Take 20 mg by mouth daily.  Yes Historical Provider, MD  hydrochlorothiazide (MICROZIDE) 12.5 MG capsule Take 1 capsule (12.5 mg total) by mouth daily. 03/05/14  Yes Dwana MelenaBryan W Hager, PA-C  metoprolol tartrate (LOPRESSOR) 25 MG tablet Take 0.5 tablets (12.5 mg total) by mouth 2 (two) times daily. 05/08/14  Yes Quintella Reichertraci R Turner, MD  nitroGLYCERIN (NITROSTAT) 0.4 MG SL tablet Place 1 tablet (0.4 mg total) under the tongue every 5 (five) minutes x 3 doses as needed for chest pain. 03/05/14  Yes Dwana MelenaBryan W Hager, PA-C  pantoprazole (PROTONIX) 40 MG tablet Take 1 tablet (40 mg total) by mouth daily. 04/21/14  Yes Ok Anishristopher R Berge, NP   BP 177/85  mmHg  Pulse 74  Temp(Src) 98.5 F (36.9 C) (Oral)  Resp 18  Ht 5\' 7"  (1.702 m)  Wt 154 lb (69.854 kg)  BMI 24.11 kg/m2  SpO2 98% Physical Exam  Constitutional: He is oriented to person, place, and time. He appears well-developed and well-nourished.  HENT:  Head: Normocephalic and atraumatic.  Eyes: Conjunctivae and EOM are normal.  Neck: Normal range of motion. Neck supple.  Cardiovascular: Normal rate, regular rhythm and normal heart sounds.   Pulmonary/Chest: Effort normal and breath sounds normal. No respiratory distress. He has no wheezes. He has no rales.  Abdominal: Soft. There is no tenderness.  Musculoskeletal: Normal range of motion.  Neurological: He is alert and oriented to person, place, and time.  Skin: Skin is warm and dry.  Psychiatric: His speech is normal and behavior is normal. Judgment normal. Cognition and memory are normal. He exhibits a depressed mood. He expresses suicidal ideation. He expresses no homicidal ideation.  Nursing note and vitals reviewed.   ED Course  Procedures (including critical care time) Labs Review Labs Reviewed  COMPREHENSIVE METABOLIC PANEL - Abnormal; Notable for the following:    Sodium 134 (*)    Glucose, Bld 124 (*)    AST 38 (*)    GFR calc non Af Amer 85 (*)    All other components within normal limits  URINE RAPID DRUG SCREEN (HOSP PERFORMED) - Abnormal; Notable for the following:    Benzodiazepines POSITIVE (*)    Tetrahydrocannabinol POSITIVE (*)    All other components within normal limits  CBC WITH DIFFERENTIAL/PLATELET  ETHANOL  Rosezena SensorI-STAT TROPOININ, ED    Imaging Review Dg Chest 2 View  06/01/2014   CLINICAL DATA:  Left chest pain.  Suicidal.  EXAM: CHEST  2 VIEW  COMPARISON:  03/25/2014  FINDINGS: Normal heart size and mediastinal contours. Coronary stent noted. No acute infiltrate or edema. No effusion or pneumothorax. No acute osseous findings.  IMPRESSION: No active cardiopulmonary disease.   Electronically Signed    By: Marnee SpringJonathon  Watts M.D.   On: 06/01/2014 22:48     EKG Interpretation   Date/Time:  Monday June 01 2014 16:06:33 EDT Ventricular Rate:  72 PR Interval:  174 QRS Duration: 96 QT Interval:  384 QTC Calculation: 420 R Axis:   84 Text Interpretation:  Normal sinus rhythm Biatrial enlargement Left  ventricular hypertrophy with repolarization abnormality Abnormal ECG  Confirmed by Lincoln Brighamees, Liz [phone]([id number]) on 06/01/2014 4:11:06 PM      MDM   Final diagnoses:  Suicidal ideation  Chest pain, unspecified chest pain type   Patient presents for SI with no specific plan. He states he is anxious and nauseated. He also c/o chest pain.  He has a recent cardiac history with a stent placed in January.  His physical exam is normal.  I have placed  TTS consult and did a cardiac workup. He had a negative troponin and normal ekg and chest xray.  22:51 I spoke to Lely [place] from [facility] and he states that Dr. Dub Mikes will accept the patient to behavior health, room 302 after 1am.  I have spoken to the patient regarding the plan and labs and he agrees.      Catha Gosselin, PA-C 06/01/14 2322  Purvis Sheffield, MD 06/02/14 Lyda Jester

## 2014-06-01 NOTE — BH Assessment (Addendum)
Tele Assessment Note   Darren [name] is an 44 y.o. male.  -Clinician spoke with Lorelle Formosa, the PA at [facility].  She said that patient has had some SI but had no plan at this time.  Patient has been drinking more over the last two weeks.  Reports panic attacks.  Patient says that he wants to be with his girlfriend (mother of his 69 year old daughter).  He is also married (soon to be divorced) and his wife (three daughters with her) interferes with his relationship with girlfriend.  Patient says that he tries to be honest with them but "it just goes south when I try to be honest."  Pt says "I sometimes think that everything would be better if I were not around."  Pt describes having daily panic attacks over the last two weeks.  Patient has SI but says "There are a lot of ways I could kill myself."  Patient thinks about harming his soon to be ex-wife.  Patient denies any A/V hallucinations.  Patient has been drinking about 4 beers per day over the last two weeks.  He drank last night but not today.  Patient has taken some puffs of marijuana over the last few days but does not use it regularly.  Pt does have prior hx of inpt care at [facility].  -Pt care discussed with Donell Sievert, PA who accepted patient to [facility].  Room assignment 302-2.  Attending with be Dr. Dub Mikes.  [facility] Tori requested patient be sent to [facility] after 01:00.  Clinician called Catha Gosselin, PA and let her know about patient being accepted to [facility].  Clinician called nurse Leeroy Bock and let her know about patient being accepted to [facility] and that he can come over after 01:00.  Axis I: Anxiety Disorder NOS and Depressive Disorder NOS Axis II: Deferred Axis III:  Past Medical History  Diagnosis Date  . Hypertension   . Tobacco abuse   . Coronary artery disease 03/24/2014    SINGLE VESSEL CAD  . Anxiety    Axis IV: housing problems and other psychosocial or environmental problems Axis V: 31-40 impairment in reality testing  Past Medical History:   Past Medical History  Diagnosis Date  . Hypertension   . Tobacco abuse   . Coronary artery disease 03/24/2014    SINGLE VESSEL CAD  . Anxiety     Past Surgical History  Procedure Laterality Date  . Left heart catheterization with coronary angiogram Bilateral 03/04/2014    Procedure: LEFT HEART CATHETERIZATION WITH CORONARY ANGIOGRAM;  Surgeon: Kathleene Hazel, MD;  Location: [facility] CATH LAB;  Service: Cardiovascular;  Laterality: Bilateral;  . Cardiac catheterization  03/04/2014  . Left heart catheterization with coronary angiogram N/A 03/26/2014    Procedure: LEFT HEART CATHETERIZATION WITH CORONARY ANGIOGRAM;  Surgeon: Lennette Bihari, MD;  Location: [facility] CATH LAB;  Service: Cardiovascular;  Laterality: N/A;    Family History:  Family History  Problem Relation Age of Onset  . Hypertension Mother   . Hypertension Father   . Hypertension Other   . Diabetes Other   . Heart attack Other     Social History:  reports that he has been smoking.  He has never used smokeless tobacco. He reports that he drinks alcohol. He reports that he uses illicit drugs.  Additional Social History:  Alcohol / Drug Use Pain Medications: N/A Prescriptions: Diazepam, BP pill, blood thinner, cholesterol pill Over the Counter: N/A History of alcohol / drug use?: Yes Substance #1 Name  of Substance 1: ETOH (beer) 1 - Age of First Use: 44 years of age 26 - Amount (size/oz): Two 24 oz beers per day 1 - Frequency: DAily  1 - Duration: Daily use over the last 2 weeks. 1 - Last Use / Amount: 04/03 at night  CIWA: CIWA-Ar BP: 177/85 mmHg Pulse Rate: 74 COWS:    PATIENT STRENGTHS: (choose at least two) Average or above average intelligence Communication skills General fund of knowledge Supportive family/friends Work skills  Allergies:  Allergies  Allergen Reactions  . Hydrocodone Hives    Home Medications:  (Not in a hospital admission)  OB/GYN Status:  No LMP for male patient.  General  Assessment Data Location of Assessment: [facility] ED Is this a Tele or Face-to-Face Assessment?: Tele Assessment Is this an Initial Assessment or a Re-assessment for this encounter?: Initial Assessment Living Arrangements: Parent (Living with father.) Can pt return to current living arrangement?: Yes Admission Status: Voluntary Is patient capable of signing voluntary admission?: Yes Transfer from: Acute Hospital Referral Source: Self/Family/Friend     [facility] Crisis Care Plan Living Arrangements: Parent (Living with father.) Name of Psychiatrist: None Name of Therapist: None     Risk to self with the past 6 months Suicidal Ideation: Yes-Currently Present Suicidal Intent: Yes-Currently Present Is patient at risk for suicide?: Yes Suicidal Plan?: No (Pt says he could have several different plans.) Access to Means: No What has been your use of drugs/alcohol within the last 12 months?: A few puffs of marijuana.  Drinking daily. Previous Attempts/Gestures: No (Has had the thoughts of SI.) How many times?: 0 Other Self Harm Risks: None Triggers for Past Attempts: None known Intentional Self Injurious Behavior: None Family Suicide History: No Recent stressful life event(s): Loss (Comment), Turmoil (Comment) (Pt having turmoil with wife and his girlfriend.) Persecutory voices/beliefs?: Yes Depression: Yes Depression Symptoms: Despondent, Isolating, Guilt, Loss of interest in usual pleasures, Feeling worthless/self pity, Insomnia Substance abuse history and/or treatment for substance abuse?: Yes Suicide prevention information given to non-admitted patients: Not applicable  Risk to Others within the past 6 months Homicidal Ideation: No Thoughts of Harm to Others: Yes-Currently Present Comment - Thoughts of Harm to Others: Thoughts of harming soon to be ex wife. Current Homicidal Intent: No Current Homicidal Plan: No Access to Homicidal Means: No Identified Victim: No one History of harm to  others?: No Assessment of Violence: In distant past Violent Behavior Description: Pt calm and cooperative Does patient have access to weapons?: Yes (Comment) (Pt's father has some guns in home) Criminal Charges Pending?: No Does patient have a court date: No  Psychosis Hallucinations: None noted Delusions: None noted  Mental Status Report Appearance/Hygiene: Unremarkable, In scrubs Eye Contact: Good Motor Activity: Freedom of movement, Unremarkable Speech: Logical/coherent Level of Consciousness: Alert Mood: Depressed, Anxious, Despair, Guilty, Helpless, Sad Affect: Anxious, Depressed, Sad Anxiety Level: Panic Attacks Panic attack frequency: Daily over the last 2 weeks Most recent panic attack: Today Thought Processes: Coherent, Relevant Judgement: Unimpaired Orientation: Person, Place, Time, Situation Obsessive Compulsive Thoughts/Behaviors: Minimal  Cognitive Functioning Concentration: Decreased Memory: Recent Impaired, Remote Intact IQ: Average Insight: Good Impulse Control: Fair Appetite: Poor Weight Loss: 0 Weight Gain: 0 Sleep: Decreased Total Hours of Sleep:  (<5H/D) Vegetative Symptoms: Staying in bed  ADLScreening [facility] Assessment Services) Patient's cognitive ability adequate to safely complete daily activities?: Yes Patient able to express need for assistance with ADLs?: Yes Independently performs ADLs?: Yes (appropriate for developmental age)  Prior Inpatient Therapy Prior Inpatient Therapy: Yes  Prior Therapy Dates: 5 years ago [facility] Reason for Treatment: ETOH and MH  Prior Outpatient Therapy Prior Outpatient Therapy: No Prior Therapy Dates: None Prior Therapy Facilty/Provider(s): None Reason for Treatment: None  ADL Screening (condition at time of admission) Patient's cognitive ability adequate to safely complete daily activities?: Yes Is the patient deaf or have difficulty hearing?: No Does the patient have  difficulty seeing, even when wearing glasses/contacts?: Yes Does the patient have difficulty concentrating, remembering, or making decisions?: No Patient able to express need for assistance with ADLs?: Yes Does the patient have difficulty dressing or bathing?: No Independently performs ADLs?: Yes (appropriate for developmental age) Does the patient have difficulty walking or climbing stairs?: No Weakness of Legs: None Weakness of Arms/Hands: None       Abuse/Neglect Assessment (Assessment to be complete while patient is alone) Physical Abuse: Denies Verbal Abuse: Denies Sexual Abuse: Denies Exploitation of patient/patient's resources: Denies Self-Neglect: Denies     Merchant navy officerAdvance Directives (For Healthcare) Does patient have an advance directive?: No Would patient like information on creating an advanced directive?: No - patient declined information    Additional Information 1:1 In Past 12 Months?: No CIRT Risk: No Elopement Risk: No Does patient have medical clearance?: Yes     Disposition:  Disposition Initial Assessment Completed for this Encounter: Yes Disposition of Patient: Inpatient treatment program, Referred to Type of inpatient treatment program: Adult Patient referred to:  (Consult with Karleen HampshireSpencer about inpatient criteria.)  Beatriz StallionHarvey, Royelle Hinchman Ray 06/01/2014 10:04 PM

## 2014-06-01 NOTE — ED Notes (Signed)
Patient at nurses station making phone call to family member.

## 2014-06-01 NOTE — ED Notes (Signed)
[facility] machine placed in room.

## 2014-06-01 NOTE — ED Notes (Signed)
Pt to ED with c/o anxiety, st's he is going through a break up and a lot of other things that are overwhelming.  St's he needs to talk to someone.  Pt st's he called Mental Health but was told he had to come here first.  Pt st's he is having suicidal thoughts.

## 2014-06-01 NOTE — ED Notes (Signed)
Pt states he has been going through a lot of stuff with his children's mother and "has a lot of stress on me." pt reports he has had thoughts of hurting himself "a few times, recently," denies specific plan to hurt himself, or anyone around him.

## 2014-06-01 NOTE — ED Notes (Signed)
Staffing office made aware of need of sitter.  Charge nurse made aware.

## 2014-06-01 NOTE — ED Notes (Signed)
[facility] called, patient has been assigned bed 2, room 302, advised we can send patient over after 0100.

## 2014-06-02 ENCOUNTER — Inpatient Hospital Stay (HOSPITAL_COMMUNITY)
Admission: EM | Admit: 2014-06-02 | Discharge: 2014-06-08 | DRG: 897 | Disposition: A | Discharge status: Home / Self Care | Payer: BLUE CROSS/BLUE SHIELD | Source: Intra-hospital | Attending: Psychiatry | Admitting: Psychiatry

## 2014-06-02 ENCOUNTER — Encounter (HOSPITAL_COMMUNITY): Payer: Self-pay | Admitting: *Deleted

## 2014-06-02 DIAGNOSIS — F329 Major depressive disorder, single episode, unspecified: Secondary | ICD-10-CM | POA: Diagnosis present

## 2014-06-02 DIAGNOSIS — Z8249 Family history of ischemic heart disease and other diseases of the circulatory system: Secondary | ICD-10-CM

## 2014-06-02 DIAGNOSIS — R071 Chest pain on breathing: Secondary | ICD-10-CM

## 2014-06-02 DIAGNOSIS — Y909 Presence of alcohol in blood, level not specified: Secondary | ICD-10-CM | POA: Diagnosis not present

## 2014-06-02 DIAGNOSIS — F17210 Nicotine dependence, cigarettes, uncomplicated: Secondary | ICD-10-CM | POA: Diagnosis present

## 2014-06-02 DIAGNOSIS — R45851 Suicidal ideations: Secondary | ICD-10-CM | POA: Diagnosis present

## 2014-06-02 DIAGNOSIS — I252 Old myocardial infarction: Secondary | ICD-10-CM | POA: Diagnosis not present

## 2014-06-02 DIAGNOSIS — F10230 Alcohol dependence with withdrawal, uncomplicated: Secondary | ICD-10-CM | POA: Diagnosis not present

## 2014-06-02 DIAGNOSIS — Z833 Family history of diabetes mellitus: Secondary | ICD-10-CM

## 2014-06-02 DIAGNOSIS — G4700 Insomnia, unspecified: Secondary | ICD-10-CM | POA: Diagnosis present

## 2014-06-02 DIAGNOSIS — I119 Hypertensive heart disease without heart failure: Secondary | ICD-10-CM | POA: Diagnosis present

## 2014-06-02 DIAGNOSIS — F410 Panic disorder [episodic paroxysmal anxiety] without agoraphobia: Secondary | ICD-10-CM | POA: Diagnosis present

## 2014-06-02 DIAGNOSIS — F1020 Alcohol dependence, uncomplicated: Secondary | ICD-10-CM | POA: Diagnosis present

## 2014-06-02 DIAGNOSIS — F419 Anxiety disorder, unspecified: Secondary | ICD-10-CM | POA: Diagnosis present

## 2014-06-02 DIAGNOSIS — I2510 Atherosclerotic heart disease of native coronary artery without angina pectoris: Secondary | ICD-10-CM | POA: Diagnosis present

## 2014-06-02 DIAGNOSIS — F1210 Cannabis abuse, uncomplicated: Secondary | ICD-10-CM | POA: Diagnosis present

## 2014-06-02 DIAGNOSIS — R079 Chest pain, unspecified: Secondary | ICD-10-CM

## 2014-06-02 DIAGNOSIS — I10 Essential (primary) hypertension: Secondary | ICD-10-CM

## 2014-06-02 DIAGNOSIS — F1094 Alcohol use, unspecified with alcohol-induced mood disorder: Secondary | ICD-10-CM | POA: Diagnosis present

## 2014-06-02 DIAGNOSIS — I25810 Atherosclerosis of coronary artery bypass graft(s) without angina pectoris: Secondary | ICD-10-CM

## 2014-06-02 DIAGNOSIS — R0602 Shortness of breath: Secondary | ICD-10-CM

## 2014-06-02 DIAGNOSIS — F1024 Alcohol dependence with alcohol-induced mood disorder: Secondary | ICD-10-CM | POA: Diagnosis not present

## 2014-06-02 MED ORDER — PANTOPRAZOLE SODIUM 40 MG PO TBEC
40.0000 mg | DELAYED_RELEASE_TABLET | Freq: Every day | ORAL | Status: DC
  Administered 2014-06-02 – 2014-06-08 (×6): 40 mg via ORAL
  Filled 2014-06-02 (×10): qty 1

## 2014-06-02 MED ORDER — CHLORDIAZEPOXIDE HCL 25 MG PO CAPS
25.0000 mg | ORAL_CAPSULE | Freq: Three times a day (TID) | ORAL | Status: AC
  Administered 2014-06-03 (×3): 25 mg via ORAL
  Filled 2014-06-02 (×2): qty 1

## 2014-06-02 MED ORDER — CLOPIDOGREL BISULFATE 75 MG PO TABS
75.0000 mg | ORAL_TABLET | Freq: Every day | ORAL | Status: DC
  Administered 2014-06-02 – 2014-06-08 (×6): 75 mg via ORAL
  Filled 2014-06-02 (×10): qty 1

## 2014-06-02 MED ORDER — ALUM & MAG HYDROXIDE-SIMETH 200-200-20 MG/5ML PO SUSP: 30.0000 mL | ORAL | Status: DC | PRN

## 2014-06-02 MED ORDER — CHLORDIAZEPOXIDE HCL 25 MG PO CAPS
25.0000 mg | ORAL_CAPSULE | Freq: Every day | ORAL | Status: AC
  Administered 2014-06-05: 25 mg via ORAL

## 2014-06-02 MED ORDER — ATORVASTATIN CALCIUM 80 MG PO TABS
80.0000 mg | ORAL_TABLET | Freq: Every day | ORAL | Status: DC
  Administered 2014-06-02 – 2014-06-07 (×6): 80 mg via ORAL
  Filled 2014-06-02 (×3): qty 1
  Filled 2014-06-02: qty 2
  Filled 2014-06-02 (×2): qty 1
  Filled 2014-06-02: qty 2
  Filled 2014-06-02 (×2): qty 1

## 2014-06-02 MED ORDER — ASPIRIN EC 81 MG PO TBEC
81.0000 mg | DELAYED_RELEASE_TABLET | Freq: Every day | ORAL | Status: DC
  Administered 2014-06-02 – 2014-06-08 (×6): 81 mg via ORAL
  Filled 2014-06-02 (×10): qty 1

## 2014-06-02 MED ORDER — HYDROCHLOROTHIAZIDE 12.5 MG PO CAPS
12.5000 mg | ORAL_CAPSULE | Freq: Every day | ORAL | Status: DC
  Administered 2014-06-02 – 2014-06-08 (×6): 12.5 mg via ORAL
  Filled 2014-06-02 (×10): qty 1

## 2014-06-02 MED ORDER — NICOTINE 21 MG/24HR TD PT24
21.0000 mg | MEDICATED_PATCH | Freq: Every day | TRANSDERMAL | Status: DC
  Administered 2014-06-02 – 2014-06-07 (×5): 21 mg via TRANSDERMAL
  Filled 2014-06-02 (×10): qty 1

## 2014-06-02 MED ORDER — NITROGLYCERIN 0.4 MG SL SUBL: 0.4000 mg | SUBLINGUAL_TABLET | SUBLINGUAL | Status: DC | PRN

## 2014-06-02 MED ORDER — ONDANSETRON 4 MG PO TBDP: 4.0000 mg | ORAL_TABLET | Freq: Four times a day (QID) | ORAL | Status: AC | PRN

## 2014-06-02 MED ORDER — LOPERAMIDE HCL 2 MG PO CAPS: 2.0000 mg | ORAL_CAPSULE | ORAL | Status: AC | PRN

## 2014-06-02 MED ORDER — THIAMINE HCL 100 MG/ML IJ SOLN: 100.0000 mg | Freq: Once | INTRAMUSCULAR | Status: DC

## 2014-06-02 MED ORDER — ACETAMINOPHEN 325 MG PO TABS
650.0000 mg | ORAL_TABLET | Freq: Four times a day (QID) | ORAL | Status: DC | PRN
  Administered 2014-06-02: 650 mg via ORAL
  Filled 2014-06-02: qty 2

## 2014-06-02 MED ORDER — CHLORDIAZEPOXIDE HCL 25 MG PO CAPS
25.0000 mg | ORAL_CAPSULE | ORAL | Status: AC
  Administered 2014-06-04: 25 mg via ORAL
  Filled 2014-06-02 (×3): qty 1

## 2014-06-02 MED ORDER — METOPROLOL TARTRATE 12.5 MG HALF TABLET
12.5000 mg | ORAL_TABLET | Freq: Two times a day (BID) | ORAL | Status: DC
  Administered 2014-06-02 – 2014-06-08 (×11): 12.5 mg via ORAL
  Filled 2014-06-02 (×15): qty 1

## 2014-06-02 MED ORDER — CHLORDIAZEPOXIDE HCL 25 MG PO CAPS
25.0000 mg | ORAL_CAPSULE | Freq: Four times a day (QID) | ORAL | Status: AC | PRN
  Administered 2014-06-02: 25 mg via ORAL
  Filled 2014-06-02: qty 1

## 2014-06-02 MED ORDER — TRAZODONE HCL 50 MG PO TABS
50.0000 mg | ORAL_TABLET | Freq: Every evening | ORAL | Status: DC | PRN
  Administered 2014-06-02 – 2014-06-07 (×7): 50 mg via ORAL
  Filled 2014-06-02 (×16): qty 1

## 2014-06-02 MED ORDER — GABAPENTIN 100 MG PO CAPS
100.0000 mg | ORAL_CAPSULE | Freq: Three times a day (TID) | ORAL | Status: DC
  Administered 2014-06-02 – 2014-06-08 (×17): 100 mg via ORAL
  Filled 2014-06-02 (×23): qty 1

## 2014-06-02 MED ORDER — HYDROXYZINE HCL 25 MG PO TABS
25.0000 mg | ORAL_TABLET | Freq: Four times a day (QID) | ORAL | Status: AC | PRN
  Administered 2014-06-03: 25 mg via ORAL
  Filled 2014-06-02: qty 1

## 2014-06-02 MED ORDER — MAGNESIUM HYDROXIDE 400 MG/5ML PO SUSP: 30.0000 mL | Freq: Every day | ORAL | Status: DC | PRN

## 2014-06-02 MED ORDER — ESCITALOPRAM OXALATE 20 MG PO TABS
20.0000 mg | ORAL_TABLET | Freq: Every day | ORAL | Status: DC
  Administered 2014-06-02 – 2014-06-08 (×6): 20 mg via ORAL
  Filled 2014-06-02: qty 2
  Filled 2014-06-02: qty 1
  Filled 2014-06-02: qty 2
  Filled 2014-06-02 (×4): qty 1
  Filled 2014-06-02: qty 2
  Filled 2014-06-02 (×3): qty 1

## 2014-06-02 MED ORDER — DIAZEPAM 5 MG PO TABS
5.0000 mg | ORAL_TABLET | Freq: Every day | ORAL | Status: DC | PRN
  Administered 2014-06-02 – 2014-06-07 (×3): 5 mg via ORAL
  Filled 2014-06-02 (×3): qty 1

## 2014-06-02 MED ORDER — ADULT MULTIVITAMIN W/MINERALS CH
1.0000 | ORAL_TABLET | Freq: Every day | ORAL | Status: DC
  Administered 2014-06-02 – 2014-06-08 (×6): 1 via ORAL
  Filled 2014-06-02 (×10): qty 1

## 2014-06-02 MED ORDER — VITAMIN B-1 100 MG PO TABS
100.0000 mg | ORAL_TABLET | Freq: Every day | ORAL | Status: DC
  Administered 2014-06-03 – 2014-06-08 (×5): 100 mg via ORAL
  Filled 2014-06-02 (×8): qty 1

## 2014-06-02 MED ORDER — CHLORDIAZEPOXIDE HCL 25 MG PO CAPS
25.0000 mg | ORAL_CAPSULE | Freq: Four times a day (QID) | ORAL | Status: AC
  Administered 2014-06-02 (×4): 25 mg via ORAL
  Filled 2014-06-02 (×4): qty 1

## 2014-06-02 NOTE — [facility] Suicide Risk Assessment (Signed)
[facility]   Nursing information obtained from:  Patient Demographic factors:  Male Current Mental Status:  NA Loss Factors:  Loss of significant relationship, Decline in physical health Historical Factors:  Family history of mental illness or substance abuse Risk Reduction Factors:  Responsible for children under 45 years of age, Sense of responsibility to family, Employed, Living with another person, especially a relative, Positive social support Total Time spent with patient: 45 minutes Principal Problem: <principal problem not specified> Diagnosis:   Patient Active Problem List   Diagnosis Date Noted  . 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 Coronary artery disease due to lipid rich plaque [I25.10]   . Chest pain [R07.9] 03/25/2014  . Accelerated hypertension [I10] 03/04/2014  . Hypertensive heart disease [I11.9] 03/04/2014  . Tobacco abuse [Z72.0] 03/04/2014  . NSTEMI (non-ST elevated myocardial infarction) [I21.4] 03/02/2014     Continued Clinical Symptoms:  Alcohol Use Disorder Identification Test Final Score (AUDIT): 10 The "Alcohol Use Disorders Identification Test", Guidelines for Use in Primary Care, Second Edition.  World Science writerHealth Organization [facility](WHO). Score between 0-7:  no or low risk or alcohol related problems. Score between 8-15:  moderate risk of alcohol related problems. Score between 16-19:  high risk of alcohol related problems. Score 20 or above:  warrants further diagnostic evaluation for alcohol dependence and treatment.   CLINICAL FACTORS:   Severe Anxiety and/or Agitation Alcohol/Substance Abuse/Dependencies   Musculoskeletal: Strength & Muscle Tone: within normal limits Gait & Station: normal Patient leans: N/A  Psychiatric Specialty Exam: Physical Exam  Review of Systems  Constitutional: Positive for weight loss and malaise/fatigue.  Eyes: Negative.   Respiratory: Positive for shortness of breath.    Cardiovascular: Positive for chest pain and palpitations.  Gastrointestinal: Positive for nausea.  Genitourinary: Negative.   Musculoskeletal: Negative.   Skin: Negative.   Neurological: Positive for dizziness, tremors, weakness and headaches.  Endo/Heme/Allergies: Negative.   Psychiatric/Behavioral: Positive for depression, suicidal ideas and substance abuse. The patient is nervous/anxious and has insomnia.     Blood pressure 169/83, pulse 78, temperature 98 F (36.7 C), temperature source Oral, resp. rate 16, height 5\' 7"  (1.702 m), weight 68.04 kg (150 lb).Body mass index is 23.49 kg/(m^2).  General Appearance: Disheveled  Eye SolicitorContact::  Fair  Speech:  Clear and Coherent and slow  not spontaneous  Volume:  Decreased  Mood:  Anxious and Depressed  Affect:  Restricted  Thought Process:  Coherent and Goal Directed  Orientation:  Full (Time, Place, and Person)  Thought Content:  events symptoms worries concerns  Suicidal Thoughts:  Yes.  without intent/plan  Homicidal Thoughts:  No  Memory:  Immediate;   Fair Recent;   Fair Remote;   Fair  Judgement:  Fair  Insight:  Present and Shallow  Psychomotor Activity:  Restlessness  Concentration:  Fair  Recall:  FiservFair  Fund of Knowledge:Fair  Language: Fair  Akathisia:  No  Handed:  Right  AIMS (if indicated):     Assets:  Desire for Improvement Housing Vocational/Educational  Sleep:  Number of Hours: 1.75  Cognition: WNL  ADL's:  Intact     COGNITIVE FEATURES THAT CONTRIBUTE TO RISK:  Closed-mindedness, Polarized thinking and Thought constriction (tunnel vision)    SUICIDE RISK:   Minimal: No identifiable suicidal ideation.  Patients presenting with no risk factors but with morbid ruminations; may be classified as minimal risk based on the severity of the depressive symptoms  PLAN OF CARE: Supportive approach/coping skills/relapse prevention  Alcohol Dependence: will detox with Librium. Will  work a relapse prevention plan                               Panic/anxiety; will reassess as we detox from alcohol. Will continue the Diazepam as he is going to continue  it as prescribe by his PCP. Will continue the Lexapro at 20 mg and add Neurontin as a possible option for the panic attacks Will work with CBT/mindfulness and other strategies to address the panic attacks Will work on Engineer, drilling of how to address the situation at home with his GF and his wife   Medical Decision Making:  Review of Psycho-Social Stressors (1), Review or order clinical lab tests (1), Review of Medication Regimen & Side Effects (2) and Review of New Medication or Change in Dosage (2)  I certify that inpatient services furnished can reasonably be expected to improve the patient's condition.   Orpha Dain A 06/02/2014, 11:48 AM

## 2014-06-02 NOTE — Plan of Care (Signed)
Problem: Ineffective individual coping Goal: STG: Patient will remain free from self harm Outcome: Progressing Patient remains free from self harm. 15 minute checks continued per protocol for patient safety.   Problem: Alteration in mood Goal: LTG-Patient reports reduction in suicidal thoughts (Patient reports reduction in suicidal thoughts and is able to verbalize a safety plan for whenever patient is feeling suicidal)  Outcome: Progressing Patient denies having any suicidal thoughts today.  Problem: Diagnosis: Increased Risk For Suicide Attempt Goal: STG-Patient Will Attend All Groups On The Unit Outcome: Progressing Patient is attending some unit groups today.  Problem: Alteration in mood & ability to function due to Goal: LTG-Patient demonstrates decreased signs of withdrawal (Patient demonstrates decreased signs of withdrawal to the point the patient is safe to return home and continue treatment in an outpatient setting)  Outcome: Progressing Patient reports withdrawal symptoms today including "shaky and anxious." Goal: STG-Patient will comply with prescribed medication regimen (Patient will comply with prescribed medication regimen)  Outcome: Progressing Patient has adhered to medication regimen today with ease.

## 2014-06-02 NOTE — Progress Notes (Signed)
Recreation Therapy Notes  Animal-Assisted Activity (AAA) Program Checklist/Progress Notes Patient Eligibility Criteria Checklist & Daily Group note for Rec Tx Intervention  Date: 04.05.2016 Time: 2:45pm Location: [address] Dayroom   AAA/T Program Assumption of Risk Form signed by Patient/ or Parent Legal Guardian yes  Patient is free of allergies or sever asthma yes  Patient reports no fear of animals yes  Patient reports no history of cruelty to animals yes  Patient understands his/her participation is voluntary yes  Behavioral Response: Did not attend.   Cong Hightower L Keilly Fatula, LRT/CTRS  Izik Bingman L 06/02/2014 5:14 PM 

## 2014-06-02 NOTE — ED Notes (Signed)
[facility] contacted [facility]

## 2014-06-02 NOTE — Tx Team (Signed)
Initial Interdisciplinary Treatment Plan   PATIENT STRESSORS: Health problems Marital or family conflict Substance abuse    PATIENT STRENGTHS: Ability for insight Average or above average intelligence Capable of independent living Communication skills Motivation for treatment/growth Supportive family/friends Work skills   PROBLEM LIST: Problem List/Patient Goals Date to be addressed Date deferred Reason deferred Estimated date of resolution  Depression "help with what I'm going through" 06/02/14   At d/c  Suicidal ideation 06/02/14   At d/c  Substance abuse 06/02/14   At d/c  anxiety 06/02/14   At d/c                                 DISCHARGE CRITERIA:  Improved stabilization in mood, thinking, and/or behavior Motivation to continue treatment in a less acute level of care Need for constant or close observation no longer present Verbal commitment to aftercare and medication compliance  PRELIMINARY DISCHARGE PLAN: Outpatient therapy Return to previous living arrangement Return to previous work or school arrangements  PATIENT/FAMIILY INVOLVEMENT: This treatment plan has been presented to and reviewed with the patient, Darren [name].  The patient and family have been given the opportunity to ask questions and make suggestions.  [name], [name] 06/02/2014, 3:38 AM

## 2014-06-02 NOTE — Progress Notes (Signed)
[facility] and cooperative. Pt presented to ED c/o anxiety, suicidal thoughts (denied specific plan) and requesting to talk to someone. Medical Hx of HTN, CAD and cardiac stent. Pt reports his big stressor is being in a "love triangle" with his wife and girlfriend whom he both have children with "It's overwhelming, they hacked my facebook". Denies SI/HI, -A/[place] at present to Clinical research associatewriter and verbally contracts for safety. C/o insomnia, increased anxiety, depression, feeling hopeless and helpless. Reports social alcohol and occasional marijuana use but admits to drinking daily over the last two weeks. AUDIT score 10, Alcohol Education Use sheet reviewed. BP elevated, receiving RN and Karleen HampshireSpencer PA made aware. Emotional support and encouragement given. Pt admitted for evaluation, stabilization and reduction of baseline. Will monitor closely.

## 2014-06-02 NOTE — [facility] Group Notes (Signed)
[facility] Group Notes:  (Nursing/MHT/Case Management/Adjunct)  Date:  06/02/2014  Time:  0900am  Type of Therapy:  Nurse Education  Participation Level:  Minimal  Participation Quality:  Attentive  Affect:  Blunted and Depressed  Cognitive:  Appropriate  Insight:  None  Engagement in Group:  Poor  Modes of Intervention:  Discussion, Education and Support  Summary of Progress/Problems: Patient attended group, remained engaged, but participation level was minimal.  Laurine BlazerGuthrie, GrenadaBrittany A 06/02/2014, 9:27 AM

## 2014-06-02 NOTE — H&P (Signed)
Psychiatric Admission Assessment Adult  Patient Identification: Darren [name] MRN:  [id number] Date of Evaluation:  06/02/2014 Chief Complaint:  Depressive DIsorder Principal Diagnosis: <principal problem not specified> Diagnosis:   Patient Active Problem List   Diagnosis Date Noted  . 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 Coronary artery disease due to lipid rich plaque [I25.10]   . Chest pain [R07.9] 03/25/2014  . Accelerated hypertension [I10] 03/04/2014  . Hypertensive heart disease [I11.9] 03/04/2014  . Tobacco abuse [Z72.0] 03/04/2014  . NSTEMI (non-ST elevated myocardial infarction) [I21.4] 03/02/2014    History of Present Illness: Darren [name] is a 44 year old African-American male. Admitted from the [facility] ED with complaints of suicidal ideations, nausea and chest pains. And during this admission assessment, he reports, "I went to the [facility] ED yesterday. I'm going through a relationship break-up. I have high anxiety problem. I feel very overwhelmed. A lot has been going on in my life in the last 2 months. I need something to get me out of this stress. I need help getting myself together to be & deal with my family. There is a lot of confusion going on in my life because I recently spent a night at my ex-wife's house because I wanted to spend some time with my children with my ex-wife. But, my ex-wife called my girl-friend and told her a fib, now my girl-friend would not let me in the house any more. I love her so much. I have been drinking quite a bit to cope, about 3 (24 oz) cans of locos daily. I have problems with anxiety anyway, now it is getting worse. My health provider is Ricka Burdock with [facility] practice. She prescribed me Valium for my bad anxiety. Also have bad heart. I had a stent put in January of this year".  Elements:  Location:  Alcohol induced mood disorder. Quality:  Increased alcohol consumption, depressed mood, suicidal  ideations. Severity:  Severe. Timing:  Symptoms worsened in the last 2 months. Duration:  Chronic. Context:  "A lot of stress going on, recent break with girl-friend, I need help to get out of this situation".  Associated Signs/Symptoms:  Depression Symptoms:  depressed mood, insomnia, feelings of worthlessness/guilt, hopelessness, anxiety, loss of energy/fatigue,  (Hypo) Manic Symptoms:  Labiality of Mood,  Anxiety Symptoms:  Excessive Worry,  Psychotic Symptoms:  Denies  PTSD Symptoms: NA  Total Time spent with patient: 1 hour  Past Medical History:  Past Medical History  Diagnosis Date  . Hypertension   . Tobacco abuse   . Coronary artery disease 03/24/2014    SINGLE VESSEL CAD  . Anxiety     Past Surgical History  Procedure Laterality Date  . Left heart catheterization with coronary angiogram Bilateral 03/04/2014    Procedure: LEFT HEART CATHETERIZATION WITH CORONARY ANGIOGRAM;  Surgeon: Burnell Blanks, MD;  Location: [facility] CATH LAB;  Service: Cardiovascular;  Laterality: Bilateral;  . Cardiac catheterization  03/04/2014  . Left heart catheterization with coronary angiogram N/A 03/26/2014    Procedure: LEFT HEART CATHETERIZATION WITH CORONARY ANGIOGRAM;  Surgeon: Troy Sine, MD;  Location: [facility] CATH LAB;  Service: Cardiovascular;  Laterality: N/A;   Family History:  Family History  Problem Relation Age of Onset  . Hypertension Mother   . Hypertension Father   . Hypertension Other   . Diabetes Other   . Heart attack Other    Social History:  History  Alcohol Use  . 0.0 oz/week  . 0 Standard  drinks or equivalent per week    Comment: occ (rare)     History  Drug Use  . Yes    Comment: Occasional marijuana    History   Social History  . Marital Status: Legally Separated    Spouse Name: N/A  . Number of Children: N/A  . Years of Education: N/A   Social History Main Topics  . Smoking status: Current Every Day Smoker -- 0.50 packs/day for 20  years  . Smokeless tobacco: Never Used  . Alcohol Use: 0.0 oz/week    0 Standard drinks or equivalent per week     Comment: occ (rare)  . Drug Use: Yes     Comment: Occasional marijuana  . Sexual Activity: Not Currently   Other Topics Concern  . None   Social History Narrative   Additional Social History:    Pain Medications: denies Prescriptions: see PTA list Over the Counter: denies History of alcohol / drug use?: Yes Name of Substance 1: ETOH (beer) 1 - Age of First Use: 44 years of age 28 - Amount (size/oz): Two 24 oz beers per day 1 - Frequency: DAily  1 - Duration: Daily use over the last 2 weeks. 1 - Last Use / Amount: 04/03 at night Name of Substance 2: marijuana 2 - Age of First Use: 18 2 - Amount (size/oz): "2 smokes" 2 - Frequency: 'not oftetn' 2 - Last Use / Amount: 06/01/14  Musculoskeletal: Strength & Muscle Tone: within normal limits Gait & Station: normal Patient leans: N/A  Psychiatric Specialty Exam: Physical Exam  Constitutional: He is oriented to person, place, and time. He appears well-developed.  HENT:  Head: Normocephalic.  Eyes: Pupils are equal, round, and reactive to light.  Neck: Normal range of motion.  Cardiovascular:  Elevated blood pressure, Hx: Stent placement.  Respiratory: Effort normal and breath sounds normal.  GI: Soft.  Genitourinary:  Denies any issues in this area  Musculoskeletal: Normal range of motion.  Neurological: He is alert and oriented to person, place, and time.  Skin: Skin is warm and dry.    Review of Systems  Constitutional: Negative.   HENT: Negative.   Eyes: Negative.   Respiratory: Negative.   Cardiovascular:       Hx heart condition. Stent placement  Gastrointestinal: Negative.   Genitourinary: Negative.   Musculoskeletal: Negative.   Skin: Negative.   Neurological: Negative.   Endo/Heme/Allergies: Negative.   Psychiatric/Behavioral: Positive for depression (rates #9) and hallucinations  (Cannabis abuse). Negative for suicidal ideas and memory loss. The patient is nervous/anxious (rates #10) and has insomnia.     Blood pressure 169/83, pulse 78, temperature 98 F (36.7 C), temperature source Oral, resp. rate 16, height $RemoveBe'5\' 7"'[id number]$  (1.702 m), weight 68.04 kg (150 lb).Body mass index is 23.49 kg/(m^2).  General Appearance: Disheveled  Eye Contact::  Good  Speech:  Clear and Coherent and Normal Rate  Volume:  Normal  Mood:  Angry, Anxious and Hopeless  Affect:  Depressed and Flat  Thought Process:  Coherent and Goal Directed  Orientation:  Full (Time, Place, and Person)  Thought Content:  Rumination  Suicidal Thoughts:  No  Homicidal Thoughts:  No  Memory:  Grossly intact  Judgement:  Good  Insight:  Present  Psychomotor Activity:  Reports high anxiety levels  Concentration:  Good  Recall:  Good  Fund of Knowledge:Fair  Language: Good  Akathisia:  No  Handed:  Right  AIMS (if indicated):     Assets:  Communication  Skills Desire for Improvement  ADL's:  Impaired  Cognition: WNL  Sleep:  Number of Hours: 1.75   Risk to Self: Is patient at risk for suicide?: Yes  Risk to Others: No  Prior Inpatient Therapy: No Prior Outpatient Therapy: No  Alcohol Screening: 1. How often do you have a drink containing alcohol?: 4 or more times a week 2. How many drinks containing alcohol do you have on a typical day when you are drinking?: 1 or 2 3. How often do you have six or more drinks on one occasion?: Less than monthly Preliminary Score: 1 4. How often during the last year have you found that you were not able to stop drinking once you had started?: Never 5. How often during the last year have you failed to do what was normally expected from you becasue of drinking?: Never 6. How often during the last year have you needed a first drink in the morning to get yourself going after a heavy drinking session?: Never 7. How often during the last year have you had a feeling of guilt  of remorse after drinking?: Less than monthly 8. How often during the last year have you been unable to remember what happened the night before because you had been drinking?: Never 9. Have you or someone else been injured as a result of your drinking?: No 10. Has a relative or friend or a doctor or another health worker been concerned about your drinking or suggested you cut down?: Yes, during the last year Alcohol Use Disorder Identification Test Final Score (AUDIT): 10 Brief Intervention: Yes  Allergies:   Allergies  Allergen Reactions  . Hydrocodone Hives   Lab Results:  Results for orders placed or performed during the hospital encounter of 06/01/14 (from the past 48 hour(s))  CBC WITH DIFFERENTIAL     Status: None   Collection Time: 06/01/14  3:53 PM  Result Value Ref Range   WBC 7.3 4.0 - 10.5 K/uL   RBC 4.92 4.22 - 5.81 MIL/uL   Hemoglobin 14.4 13.0 - 17.0 g/dL   HCT 41.7 39.0 - 52.0 %   MCV 84.8 78.0 - 100.0 fL   MCH 29.3 26.0 - 34.0 pg   MCHC 34.5 30.0 - 36.0 g/dL   RDW 15.1 11.5 - 15.5 %   Platelets 295 150 - 400 K/uL   Neutrophils Relative % 67 43 - 77 %   Neutro Abs 5.0 1.7 - 7.7 K/uL   Lymphocytes Relative 21 12 - 46 %   Lymphs Abs 1.5 0.7 - 4.0 K/uL   Monocytes Relative 9 3 - 12 %   Monocytes Absolute 0.6 0.1 - 1.0 K/uL   Eosinophils Relative 2 0 - 5 %   Eosinophils Absolute 0.1 0.0 - 0.7 K/uL   Basophils Relative 1 0 - 1 %   Basophils Absolute 0.0 0.0 - 0.1 K/uL  Comprehensive metabolic panel     Status: Abnormal   Collection Time: 06/01/14  3:53 PM  Result Value Ref Range   Sodium 134 (L) 135 - 145 mmol/L   Potassium 3.5 3.5 - 5.1 mmol/L   Chloride 96 96 - 112 mmol/L   CO2 27 19 - 32 mmol/L   Glucose, Bld 124 (H) 70 - 99 mg/dL   BUN 10 6 - 23 mg/dL   Creatinine, Ser 1.05 0.50 - 1.35 mg/dL   Calcium 9.2 8.4 - 10.5 mg/dL   Total Protein 7.4 6.0 - 8.3 g/dL   Albumin 4.0 3.5 -  5.2 g/dL   AST 38 (H) 0 - 37 U/L   ALT 26 0 - 53 U/L   Alkaline Phosphatase  74 39 - 117 U/L   Total Bilirubin 1.0 0.3 - 1.2 mg/dL   GFR calc non Af Amer 85 (L) >90 mL/min   GFR calc Af Amer >90 >90 mL/min    Comment: (NOTE) The eGFR has been calculated using the CKD EPI equation. This calculation has not been validated in all clinical situations. eGFR's persistently <90 mL/min signify possible Chronic Kidney Disease.    Anion gap 11 5 - 15  Ethanol     Status: None   Collection Time: 06/01/14  3:53 PM  Result Value Ref Range   Alcohol, Ethyl (B) <5 0 - 9 mg/dL    Comment:        LOWEST DETECTABLE LIMIT FOR SERUM ALCOHOL IS 11 mg/dL FOR MEDICAL PURPOSES ONLY   Drug screen panel, emergency     Status: Abnormal   Collection Time: 06/01/14  8:34 PM  Result Value Ref Range   Opiates NONE DETECTED NONE DETECTED   Cocaine NONE DETECTED NONE DETECTED   Benzodiazepines POSITIVE (A) NONE DETECTED   Amphetamines NONE DETECTED NONE DETECTED   Tetrahydrocannabinol POSITIVE (A) NONE DETECTED   Barbiturates NONE DETECTED NONE DETECTED    Comment:        DRUG SCREEN FOR MEDICAL PURPOSES ONLY.  IF CONFIRMATION IS NEEDED FOR ANY PURPOSE, NOTIFY LAB WITHIN 5 DAYS.        LOWEST DETECTABLE LIMITS FOR URINE DRUG SCREEN Drug Class       Cutoff (ng/mL) Amphetamine      1000 Barbiturate      200 Benzodiazepine   552 Tricyclics       080 Opiates          300 Cocaine          300 THC              50   I-Stat Troponin, ED (not at [facility])     Status: None   Collection Time: 06/01/14 10:16 PM  Result Value Ref Range   Troponin i, poc 0.01 0.00 - 0.08 ng/mL   Comment 3            Comment: Due to the release kinetics of cTnI, a negative result within the first hours of the onset of symptoms does not rule out myocardial infarction with certainty. If myocardial infarction is still suspected, repeat the test at appropriate intervals.    Current Medications: Current Facility-Administered Medications  Medication Dose Route Frequency Provider Last Rate Last Dose  .  acetaminophen (TYLENOL) tablet 650 mg  650 mg Oral Q6H PRN Laverle Hobby, PA-C      . alum & mag hydroxide-simeth (MAALOX/MYLANTA) 200-200-20 MG/5ML suspension 30 mL  30 mL Oral Q4H PRN Laverle Hobby, PA-C      . aspirin EC tablet 81 mg  81 mg Oral Daily Laverle Hobby, PA-C   81 mg at 06/02/14 0802  . atorvastatin (LIPITOR) tablet 80 mg  80 mg Oral q1800 Laverle Hobby, PA-C      . chlordiazePOXIDE (LIBRIUM) capsule 25 mg  25 mg Oral Q6H PRN Laverle Hobby, PA-C      . chlordiazePOXIDE (LIBRIUM) capsule 25 mg  25 mg Oral QID Laverle Hobby, PA-C   25 mg at 06/02/14 0802   Followed by  . [START ON 06/03/2014] chlordiazePOXIDE (LIBRIUM) capsule 25 mg  25  mg Oral TID Laverle Hobby, PA-C       Followed by  . [START ON 06/04/2014] chlordiazePOXIDE (LIBRIUM) capsule 25 mg  25 mg Oral BH-qamhs Spencer E Simon, PA-C       Followed by  . [START ON 06/05/2014] chlordiazePOXIDE (LIBRIUM) capsule 25 mg  25 mg Oral Daily Laverle Hobby, PA-C      . clopidogrel (PLAVIX) tablet 75 mg  75 mg Oral Daily Laverle Hobby, PA-C   75 mg at 06/02/14 0802  . diazepam (VALIUM) tablet 5 mg  5 mg Oral Daily PRN Laverle Hobby, PA-C      . escitalopram (LEXAPRO) tablet 20 mg  20 mg Oral Daily Laverle Hobby, PA-C   20 mg at 06/02/14 0802  . hydrochlorothiazide (MICROZIDE) capsule 12.5 mg  12.5 mg Oral Daily Laverle Hobby, PA-C   12.5 mg at 06/02/14 0802  . hydrOXYzine (ATARAX/VISTARIL) tablet 25 mg  25 mg Oral Q6H PRN Laverle Hobby, PA-C      . loperamide (IMODIUM) capsule 2-4 mg  2-4 mg Oral PRN Laverle Hobby, PA-C      . magnesium hydroxide (MILK OF MAGNESIA) suspension 30 mL  30 mL Oral Daily PRN Laverle Hobby, PA-C      . metoprolol tartrate (LOPRESSOR) tablet 12.5 mg  12.5 mg Oral BID Laverle Hobby, PA-C   12.5 mg at 06/02/14 0802  . multivitamin with minerals tablet 1 tablet  1 tablet Oral Daily Laverle Hobby, PA-C   1 tablet at 06/02/14 0802  . nicotine (NICODERM CQ - dosed in mg/24 hours) patch  21 mg  21 mg Transdermal Daily Laverle Hobby, PA-C   21 mg at 06/02/14 0801  . nitroGLYCERIN (NITROSTAT) SL tablet 0.4 mg  0.4 mg Sublingual Q5 Min x 3 PRN Laverle Hobby, PA-C      . ondansetron (ZOFRAN-ODT) disintegrating tablet 4 mg  4 mg Oral Q6H PRN Laverle Hobby, PA-C      . pantoprazole (PROTONIX) EC tablet 40 mg  40 mg Oral Daily Laverle Hobby, PA-C   40 mg at 06/02/14 0802  . thiamine (B-1) injection 100 mg  100 mg Intramuscular Once Laverle Hobby, PA-C   100 mg at 06/02/14 0230  . [START ON 06/03/2014] thiamine (VITAMIN B-1) tablet 100 mg  100 mg Oral Daily Laverle Hobby, PA-C      . traZODone (DESYREL) tablet 50 mg  50 mg Oral QHS,MR X 1 Spencer E Simon, PA-C       PTA Medications: Prescriptions prior to admission  Medication Sig Dispense Refill Last Dose  . aspirin EC 81 MG EC tablet Take 1 tablet (81 mg total) by mouth daily.   05/31/2014 at Unknown time  . atorvastatin (LIPITOR) 80 MG tablet Take 1 tablet (80 mg total) by mouth daily at 6 PM. 30 tablet 5 05/31/2014 at Unknown time  . clopidogrel (PLAVIX) 75 MG tablet Take 1 tablet (75 mg total) by mouth daily. 30 tablet 5 05/31/2014 at Unknown time  . diazepam (VALIUM) 5 MG tablet Take 5 mg by mouth daily as needed for anxiety.   0 05/31/2014 at Unknown time  . escitalopram (LEXAPRO) 20 MG tablet Take 20 mg by mouth daily.   05/31/2014 at Unknown time  . hydrochlorothiazide (MICROZIDE) 12.5 MG capsule Take 1 capsule (12.5 mg total) by mouth daily. 30 capsule 5 05/31/2014 at Unknown time  . metoprolol tartrate (LOPRESSOR) 25 MG tablet Take  0.5 tablets (12.5 mg total) by mouth 2 (two) times daily. 60 tablet 5 05/31/2014 at Unknown time  . nitroGLYCERIN (NITROSTAT) 0.4 MG SL tablet Place 1 tablet (0.4 mg total) under the tongue every 5 (five) minutes x 3 doses as needed for chest pain. 25 tablet 12 unknown at unknown  . pantoprazole (PROTONIX) 40 MG tablet Take 1 tablet (40 mg total) by mouth daily. 60 tablet 5 05/31/2014 at Unknown time    Previous Psychotropic Medications: Yes   Substance Abuse History in the last 12 months:  Yes.    Consequences of Substance Abuse: Medical Consequences:  Liver damage, Possible death by overdose Legal Consequences:  Arrests, jail time, Loss of driving privilege. Family Consequences:  Family discord, divorce and or separation.  Results for orders placed or performed during the hospital encounter of 06/01/14 (from the past 72 hour(s))  CBC WITH DIFFERENTIAL     Status: None   Collection Time: 06/01/14  3:53 PM  Result Value Ref Range   WBC 7.3 4.0 - 10.5 K/uL   RBC 4.92 4.22 - 5.81 MIL/uL   Hemoglobin 14.4 13.0 - 17.0 g/dL   HCT 41.7 39.0 - 52.0 %   MCV 84.8 78.0 - 100.0 fL   MCH 29.3 26.0 - 34.0 pg   MCHC 34.5 30.0 - 36.0 g/dL   RDW 15.1 11.5 - 15.5 %   Platelets 295 150 - 400 K/uL   Neutrophils Relative % 67 43 - 77 %   Neutro Abs 5.0 1.7 - 7.7 K/uL   Lymphocytes Relative 21 12 - 46 %   Lymphs Abs 1.5 0.7 - 4.0 K/uL   Monocytes Relative 9 3 - 12 %   Monocytes Absolute 0.6 0.1 - 1.0 K/uL   Eosinophils Relative 2 0 - 5 %   Eosinophils Absolute 0.1 0.0 - 0.7 K/uL   Basophils Relative 1 0 - 1 %   Basophils Absolute 0.0 0.0 - 0.1 K/uL  Comprehensive metabolic panel     Status: Abnormal   Collection Time: 06/01/14  3:53 PM  Result Value Ref Range   Sodium 134 (L) 135 - 145 mmol/L   Potassium 3.5 3.5 - 5.1 mmol/L   Chloride 96 96 - 112 mmol/L   CO2 27 19 - 32 mmol/L   Glucose, Bld 124 (H) 70 - 99 mg/dL   BUN 10 6 - 23 mg/dL   Creatinine, Ser 1.05 0.50 - 1.35 mg/dL   Calcium 9.2 8.4 - 10.5 mg/dL   Total Protein 7.4 6.0 - 8.3 g/dL   Albumin 4.0 3.5 - 5.2 g/dL   AST 38 (H) 0 - 37 U/L   ALT 26 0 - 53 U/L   Alkaline Phosphatase 74 39 - 117 U/L   Total Bilirubin 1.0 0.3 - 1.2 mg/dL   GFR calc non Af Amer 85 (L) >90 mL/min   GFR calc Af Amer >90 >90 mL/min    Comment: (NOTE) The eGFR has been calculated using the CKD EPI equation. This calculation has not been validated in  all clinical situations. eGFR's persistently <90 mL/min signify possible Chronic Kidney Disease.    Anion gap 11 5 - 15  Ethanol     Status: None   Collection Time: 06/01/14  3:53 PM  Result Value Ref Range   Alcohol, Ethyl (B) <5 0 - 9 mg/dL    Comment:        LOWEST DETECTABLE LIMIT FOR SERUM ALCOHOL IS 11 mg/dL FOR MEDICAL PURPOSES ONLY   Drug screen  panel, emergency     Status: Abnormal   Collection Time: 06/01/14  8:34 PM  Result Value Ref Range   Opiates NONE DETECTED NONE DETECTED   Cocaine NONE DETECTED NONE DETECTED   Benzodiazepines POSITIVE (A) NONE DETECTED   Amphetamines NONE DETECTED NONE DETECTED   Tetrahydrocannabinol POSITIVE (A) NONE DETECTED   Barbiturates NONE DETECTED NONE DETECTED    Comment:        DRUG SCREEN FOR MEDICAL PURPOSES ONLY.  IF CONFIRMATION IS NEEDED FOR ANY PURPOSE, NOTIFY LAB WITHIN 5 DAYS.        LOWEST DETECTABLE LIMITS FOR URINE DRUG SCREEN Drug Class       Cutoff (ng/mL) Amphetamine      1000 Barbiturate      200 Benzodiazepine   295 Tricyclics       621 Opiates          300 Cocaine          300 THC              50   I-Stat Troponin, ED (not at [facility])     Status: None   Collection Time: 06/01/14 10:16 PM  Result Value Ref Range   Troponin i, poc 0.01 0.00 - 0.08 ng/mL   Comment 3            Comment: Due to the release kinetics of cTnI, a negative result within the first hours of the onset of symptoms does not rule out myocardial infarction with certainty. If myocardial infarction is still suspected, repeat the test at appropriate intervals.     Observation Level/Precautions:  15 minute checks  Laboratory:  Per ED  Psychotherapy: Group counseling sessions   Medications: See MAR for active medication lists  Consultations: As needed   Discharge Concerns: safety, mood stability   Estimated LOS:  3-5 days  Other:     Psychological Evaluations: Yes   Treatment Plan Summary: Daily contact with patient to assess and  evaluate symptoms and progress in treatment and Medication management: Treatment Plan/Recommendations: 1. Admit for crisis management and stabilization, estimated length of stay 3-5 days.  2. Medication management to reduce current symptoms to base line and improve the patient's overall level of functioning; continue Librium detox protocols already in progress, 3. Treat health problems as indicated; resumed all pertinent home medications for the other medical issues.  4. Develop treatment plan to decrease risk of relapse upon discharge and the need for readmission.  5. Psycho-social education regarding relapse prevention and self care.  6. Health care follow up as needed for medical problems.  7. Review, reconcile, and reinstate any pertinent home medications for other health issues where appropriate. 8. Call for consults with hospitalist for any additional specialty patient care services as needed.  Medical Decision Making:  New problem, with additional work up planned, Review of Psycho-Social Stressors (1), Review or order clinical lab tests (1), Review and summation of old records (2), Review of Last Therapy Session (1) and Review or order medicine tests (1)  I certify that inpatient services furnished can reasonably be expected to improve the patient's condition.   Encarnacion Slates, PMHNP, FNP-BC 4/5/201611:46 AM I personally assessed the patient, reviewed the physical exam and labs and formulated the treatment plan Geralyn Flash A. Sabra Heck, M.D.

## 2014-06-02 NOTE — Progress Notes (Signed)
D: Patient is alert and oriented. Pt's mood and affect is depressed, anxious, and blunted. Pt denies SI/HI and AVH. Pt reports withdrawal symptoms including "shakey, and anxious." Pt minimally responds to RN. Pt experiencing HTN today, pt denies symptoms (See docflowsheet-vitals).  Pt is attending some unit groups. Pt complains of headache this evening 5/10 and anxiety with some relief from PRN medication. A: MD Dub MikesLugo and NP Agnus made aware of pt's BP, will reassess vitals frequently. Active listening by RN. Encouragement/Support provided to pt. Urine culture collected per providers orders and awaiting lab pick up.  Medication education reviewed with pt. PRN mediation administered for pain and anxiety per providers orders (See MAR). Scheduled medications administered per providers orders (See MAR). 15 minute checks continued per protocol for patient safety.  R: Patient cooperative and receptive to nursing interventions. Pt remains safe.

## 2014-06-02 NOTE — [facility] Group Notes (Signed)
Adult Psychoeducational Group Note  Date:  06/02/2014 Time:  9:12 PM  Group Topic/Focus:  Wrap-Up Group:   The focus of this group is to help patients review their daily goal of treatment and discuss progress on daily workbooks.  Participation Level:  Minimal  Participation Quality:  Attentive  Affect:  Depressed and Flat  Cognitive:  Alert  Insight: Lacking  Engagement in Group:  Limited  Modes of Intervention:  Discussion  Additional Comments:  Darren MostCharles said his day was terrible because of his anxiety and depression.  He stated he took medication for it.  He didn't have any goals and slept [name] of the day.  Darren [name], [name] A 06/02/2014, 9:12 PM

## 2014-06-03 ENCOUNTER — Encounter (HOSPITAL_COMMUNITY): Payer: BLUE CROSS/BLUE SHIELD

## 2014-06-03 LAB — HIV ANTIBODY (ROUTINE TESTING W REFLEX): HIV Screen 4th Generation wRfx: NONREACTIVE

## 2014-06-03 LAB — GC/CHLAMYDIA PROBE AMP (~~LOC~~) NOT AT ARMC
CHLAMYDIA, DNA PROBE: NEGATIVE
NEISSERIA GONORRHEA: NEGATIVE

## 2014-06-03 LAB — RPR: RPR Ser Ql: NONREACTIVE

## 2014-06-03 NOTE — Progress Notes (Signed)
Pt reports he has been very anxious today and feeling helpless about his situation with his estranged wife and GF.  He says his wife called his girlfriend and told her something that was not true, and now his girlfriend is angry with him.  He is so upset that he began to have suicidal thoughts.  Pt is also having some moderate withdrawal symptoms this evening.  His BP is elevated, but he has a hx of HTN.  Providers are aware.  Pt denies HI/AV, but says he is still having passive suicidal thoughts this evening.  Pt is medicated per orders.  He has been in bed most of the evening.  Support and encouragement offered.  Pt makes his needs known to staff.  Safety maintained with q15 minute checks.

## 2014-06-03 NOTE — Progress Notes (Signed)
[facility] MD Progress Note  06/03/2014 5:53 PM Darren [name]  MRN:  [id number] Subjective:  Stpehen continues to have a hard time. He has not heard from his GF and is afraid that the relationship has been damaged as now she does not trust him. He states he really wants to be with her and be part of their child's live. He also wants to be a part of the children he has with his ex. Admits that he gets very overwhelmed feels anxious depressed with constant worry Principal Problem: Alcohol dependence Diagnosis:   Patient Active Problem List   Diagnosis Date Noted  . 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 Alcohol dependence [F10.20] 06/02/2014  . Panic attacks [F41.0] 06/02/2014  . Coronary artery disease due to lipid rich plaque [I25.10]   . Chest pain [R07.9] 03/25/2014  . Accelerated hypertension [I10] 03/04/2014  . Hypertensive heart disease [I11.9] 03/04/2014  . Tobacco abuse [Z72.0] 03/04/2014  . NSTEMI (non-ST elevated myocardial infarction) [I21.4] 03/02/2014   Total Time spent with patient: 30 minutes   Past Medical History:  Past Medical History  Diagnosis Date  . Hypertension   . Tobacco abuse   . Coronary artery disease 03/24/2014    SINGLE VESSEL CAD  . Anxiety     Past Surgical History  Procedure Laterality Date  . Left heart catheterization with coronary angiogram Bilateral 03/04/2014    Procedure: LEFT HEART CATHETERIZATION WITH CORONARY ANGIOGRAM;  Surgeon: Kathleene Hazel, MD;  Location: [facility] CATH LAB;  Service: Cardiovascular;  Laterality: Bilateral;  . Cardiac catheterization  03/04/2014  . Left heart catheterization with coronary angiogram N/A 03/26/2014    Procedure: LEFT HEART CATHETERIZATION WITH CORONARY ANGIOGRAM;  Surgeon: Lennette Bihari, MD;  Location: [facility] CATH LAB;  Service: Cardiovascular;  Laterality: N/A;   Family History:  Family History  Problem Relation Age of Onset  . Hypertension Mother   . Hypertension Father   . Hypertension Other    . Diabetes Other   . Heart attack Other    Social History:  History  Alcohol Use  . 0.0 oz/week  . 0 Standard drinks or equivalent per week    Comment: occ (rare)     History  Drug Use  . Yes    Comment: Occasional marijuana    History   Social History  . Marital Status: Legally Separated    Spouse Name: N/A  . Number of Children: N/A  . Years of Education: N/A   Social History Main Topics  . Smoking status: Current Every Day Smoker -- 0.50 packs/day for 20 years  . Smokeless tobacco: Never Used  . Alcohol Use: 0.0 oz/week    0 Standard drinks or equivalent per week     Comment: occ (rare)  . Drug Use: Yes     Comment: Occasional marijuana  . Sexual Activity: Not Currently   Other Topics Concern  . None   Social History Narrative   Additional History:    Sleep: Fair  Appetite:  Fair   Assessment:   Musculoskeletal: Strength & Muscle Tone: within normal limits Gait & Station: normal Patient leans: N/A   Psychiatric Specialty Exam: Physical Exam  Review of Systems  Constitutional: Positive for malaise/fatigue.  HENT: Negative.   Eyes: Negative.   Respiratory: Negative.   Cardiovascular: Negative.   Gastrointestinal: Negative.   Genitourinary: Negative.   Musculoskeletal: Negative.   Skin: Negative.   Neurological: Positive for weakness.  Endo/Heme/Allergies: Negative.   Psychiatric/Behavioral: Positive for depression and  substance abuse. The patient is nervous/anxious.     Blood pressure 129/73, pulse 57, temperature 98.8 F (37.1 C), temperature source Oral, resp. rate 18, height 5\' 7"  (1.702 m), weight 68.04 kg (150 lb).Body mass index is 23.49 kg/(m^2).  General Appearance: Disheveled  Eye Solicitor::  Fair  Speech:  Clear and Coherent  Volume:  Decreased  Mood:  Anxious and Depressed  Affect:  sad, anxious worried  Thought Process:  Coherent and Goal Directed  Orientation:  Full (Time, Place, and Person)  Thought Content:  symptoms  events worries concerns  Suicidal Thoughts:  at times, no plans  Homicidal Thoughts:  No  Memory:  Immediate;   Fair Recent;   Fair Remote;   Fair  Judgement:  Fair  Insight:  Present and Shallow  Psychomotor Activity:  Restlessness  Concentration:  Fair  Recall:  [employer] of Knowledge:Fair  Language: Fair  Akathisia:  No  Handed:  Right  AIMS (if indicated):     Assets:  Desire for Improvement Vocational/Educational  ADL's:  Intact  Cognition: WNL  Sleep:  Number of Hours: 6     Current Medications: Current Facility-Administered Medications  Medication Dose Route Frequency Provider Last Rate Last Dose  . acetaminophen (TYLENOL) tablet 650 mg  650 mg Oral Q6H PRN Kerry Hough, PA-C   650 mg at 06/02/14 1703  . alum & mag hydroxide-simeth (MAALOX/MYLANTA) 200-200-20 MG/5ML suspension 30 mL  30 mL Oral Q4H PRN Kerry Hough, PA-C      . aspirin EC tablet 81 mg  81 mg Oral Daily Kerry Hough, PA-C   81 mg at 06/03/14 0835  . atorvastatin (LIPITOR) tablet 80 mg  80 mg Oral q1800 Kerry Hough, PA-C   80 mg at 06/03/14 1711  . chlordiazePOXIDE (LIBRIUM) capsule 25 mg  25 mg Oral Q6H PRN Kerry Hough, PA-C   25 mg at 06/02/14 1952  . [START ON 06/04/2014] chlordiazePOXIDE (LIBRIUM) capsule 25 mg  25 mg Oral BH-qamhs Spencer E Simon, PA-C       Followed by  . [START ON 06/05/2014] chlordiazePOXIDE (LIBRIUM) capsule 25 mg  25 mg Oral Daily Kerry Hough, PA-C      . clopidogrel (PLAVIX) tablet 75 mg  75 mg Oral Daily Kerry Hough, PA-C   75 mg at 06/03/14 0836  . diazepam (VALIUM) tablet 5 mg  5 mg Oral Daily PRN Kerry Hough, PA-C   5 mg at 06/02/14 1703  . escitalopram (LEXAPRO) tablet 20 mg  20 mg Oral Daily Kerry Hough, PA-C   20 mg at 06/03/14 0835  . gabapentin (NEURONTIN) capsule 100 mg  100 mg Oral TID Rachael Fee, MD   100 mg at 06/03/14 1711  . hydrochlorothiazide (MICROZIDE) capsule 12.5 mg  12.5 mg Oral Daily Kerry Hough, PA-C   12.5 mg at  06/03/14 1610  . hydrOXYzine (ATARAX/VISTARIL) tablet 25 mg  25 mg Oral Q6H PRN Kerry Hough, PA-C      . loperamide (IMODIUM) capsule 2-4 mg  2-4 mg Oral PRN Kerry Hough, PA-C      . magnesium hydroxide (MILK OF MAGNESIA) suspension 30 mL  30 mL Oral Daily PRN Kerry Hough, PA-C      . metoprolol tartrate (LOPRESSOR) tablet 12.5 mg  12.5 mg Oral BID Kerry Hough, PA-C   12.5 mg at 06/03/14 1710  . multivitamin with minerals tablet 1 tablet  1 tablet Oral Daily Karleen Hampshire  Nancy Fetter, PA-C   1 tablet at 06/03/14 0835  . nicotine (NICODERM CQ - dosed in mg/24 hours) patch 21 mg  21 mg Transdermal Daily Kerry Hough, PA-C   21 mg at 06/03/14 0836  . nitroGLYCERIN (NITROSTAT) SL tablet 0.4 mg  0.4 mg Sublingual Q5 Min x 3 PRN Kerry Hough, PA-C      . ondansetron (ZOFRAN-ODT) disintegrating tablet 4 mg  4 mg Oral Q6H PRN Kerry Hough, PA-C      . pantoprazole (PROTONIX) EC tablet 40 mg  40 mg Oral Daily Kerry Hough, PA-C   40 mg at 06/03/14 0836  . thiamine (B-1) injection 100 mg  100 mg Intramuscular Once Kerry Hough, PA-C   100 mg at 06/02/14 0230  . thiamine (VITAMIN B-1) tablet 100 mg  100 mg Oral Daily Kerry Hough, PA-C   100 mg at 06/03/14 1610  . traZODone (DESYREL) tablet 50 mg  50 mg Oral QHS,MR X 1 Kerry Hough, PA-C   50 mg at 06/02/14 2129    Lab Results:  Results for orders placed or performed during the hospital encounter of 06/02/14 (from the past 48 hour(s))  GC/Chlamydia probe amp ([place])     Status: None   Collection Time: 06/02/14 12:00 AM  Result Value Ref Range   Chlamydia Negative     Comment: Normal Reference Range - Negative   Neisseria gonorrhea Negative     Comment: Normal Reference Range - Negative  HIV antibody     Status: None   Collection Time: 06/02/14  7:25 PM  Result Value Ref Range   HIV Screen 4th Generation wRfx Non Reactive Non Reactive    Comment: (NOTE) Performed At: [facility] [address] [place],  [place] [id number] Mila Homer MD JX:[id number] Performed at [facility]   RPR     Status: None   Collection Time: 06/02/14  7:25 PM  Result Value Ref Range   RPR Ser Ql Non Reactive Non Reactive    Comment: (NOTE) Performed At: [facility] [address] [place], [place] [id number] Mila Homer MD ON:[id number] Performed at [facility]     Physical Findings: AIMS: Facial and Oral Movements Muscles of Facial Expression: None, normal Lips and Perioral Area: None, normal Jaw: None, normal Tongue: None, normal,Extremity Movements Upper (arms, wrists, hands, fingers): None, normal Lower (legs, knees, ankles, toes): None, normal, Trunk Movements Neck, shoulders, hips: None, normal, Overall Severity Severity of abnormal movements (highest score from questions above): None, normal Incapacitation due to abnormal movements: None, normal Patient's awareness of abnormal movements (rate only patient's report): No Awareness, Dental Status Current problems with teeth and/or dentures?: No Does patient usually wear dentures?: Yes (top dentures)  CIWA:  CIWA-Ar Total: 2 COWS:     Treatment Plan Summary: Daily contact with patient to assess and evaluate symptoms and progress in treatment and Medication management  Supportive approach/coping skills Alcohol Dependence: continue the Librium detox protocol, work a relapse prevention plan Anxiety/panic; will continue the Valium as we wean off the Librium. Will also continue the Lexapro Depression, will optimize response to the Lexapro Will continue to work on healthier ways of dealing with the conflictive interactions with his GF and ex wife  Medical Decision Making:  Review of Psycho-Social Stressors (1), Review or order clinical lab tests (1), Review of Medication Regimen & Side Effects (2) and Review of New Medication or Change in Dosage (2)  Finnick Orosz A 06/03/2014, 5:53 PM
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
## 2014-06-03 NOTE — Progress Notes (Signed)
Recreation Therapy Notes  Date: 04.06.2016 Time: 9:30am Location: [address] Group Room   Group Topic: Stress Management  Goal Area(s) Addresses:  Patient will actively participate in stress management techniques presented during session.   Behavioral Response: Appropriate, Engaged  Intervention: Stress management techniques   Activity :  Deep Breathing and Progressive Body Scan. LRT provided instruction and demonstration on practice of progressive body scan. Technique was coupled with deep breathing.   Education:  Stress Management, Discharge Planning.   Education Outcome: Acknowledges education  Clinical Observations/Feedback: Patient actively engaged in session, practicing technique with LRT and peers. Patient expressed no concerns and demonstrated ability to practice independently post d/c.   Marykay Lexenise L Jamerius Boeckman, LRT/CTRS  Taitum Alms L 06/03/2014 2:29 PM

## 2014-06-03 NOTE — BHH Group Notes (Signed)
BHH LCSW Group Therapy 06/03/2014  1:15 PM Type of Therapy: Group Therapy Participation Level: Active  Participation Quality: Attentive, Sharing and Supportive  Affect: Depressed and Flat  Cognitive: Alert and Oriented  Insight: Developing/Improving and Engaged  Engagement in Therapy: Developing/Improving and Engaged  Modes of Intervention: Clarification, Confrontation, Discussion, Education, Exploration, Limit-setting, Orientation, Problem-solving, Rapport Building, Dance movement psychotherapisteality Testing, Socialization and Support  Summary of Progress/Problems: The topic for group today was emotional regulation. This group focused on both positive and negative emotion identification and allowed group members to process ways to identify feelings, regulate negative emotions, and find healthy ways to manage internal/external emotions. Group members were asked to reflect on a time when their reaction to an emotion led to a negative outcome and explored how alternative responses using emotion regulation would have benefited them. Group members were also asked to discuss a time when emotion regulation was utilized when a negative emotion was experienced. Patient discussed experiencing social anxiety and identified his relationship with his ex-wife and ex-girlfriend as stressors. CSW and group engaged in discussion of healthy coping skills including listening to music, walking, reading, going to support groups, and deep breathing.   Samuella BruinKristin Darletta Noblett, MSW, [employer]CSWA Clinical Social Worker [facility] [phone]

## 2014-06-03 NOTE — [facility] Group Notes (Signed)
   [facility]  06/03/2014  8:45 AM   Participation Quality: Alert, Appropriate and Oriented  Mood/Affect: Depressed and Flat  Depression Rating: 8  Anxiety Rating: 8  Thoughts of Suicide: Pt denies SI/HI  Will you contract for safety? Yes  Current AVH: Pt denies  Plan for Discharge/Comments: Pt attended discharge planning group and actively participated in group. CSW provided pt with today's workbook. Patient plans to return home to follow up with outpatient services.  Transportation Means: Pt reports access to transportation  Supports: No supports mentioned at this time  Samuella BruinKristin Manases Etchison, MSW, [employer]CSWA Clinical Social Worker [facility] [phone]

## 2014-06-03 NOTE — Progress Notes (Signed)
Darren [name] is quiet, flat and depressed. Darren [name] shows no emotion and speaks very slowly. Darren [name] says Darren [name] feels tired today and Darren [name] looks weak, standing in the hallway speaking to this nurse this morning.   A Darren [name] takes his scheduled meds as ordered and Darren [name] completes his morning assessment and on it Darren [name] writes Darren [name] has had suicidal ideation today ( but Darren [name] contracts to stay safe) and Darren [name] rates his depression, hopelessness and anxiety " 8/8/8" respectively.   R Safety is in place. POC cotn.

## 2014-06-04 NOTE — Progress Notes (Signed)
Pt attended and was engaged in karaoke. 

## 2014-06-04 NOTE — Progress Notes (Signed)
Patient ID: Darren [name], male   DOB: [birth year], 44 y.o.   MRN: [id number] D: Patient reports feeling much better knowing of negative lab results. Pt reports girlfriend is still not supportive but  he feels safe here at [facility] and is determine to get well.  Pt denies withdrawal symptoms but c/o of anxiety. Cooperative with assessment.    A: Met with pt 1:1. Medications administered as prescribed. Writer encouraged pt to discuss feelings. Pt encouraged to come to staff with any question or concerns.   R: Patient remains safe. Pt denies SI/HI/AVH and pain. Pt attended evening karaoke and was supportive of peers. He is complaint with medications and denies any adverse reaction.

## 2014-06-04 NOTE — Progress Notes (Signed)
Patient ID: Janeece Ageeharles A Easterday, male   DOB: June 04, 1970, 44 y.o.   MRN: [id number] D: Patient slept majority of day, electing to refuse to come up for medications.  He got up at lunch time and took his 1200 meds.  Patient presents with sad, depressed mood.  He rates is anxiety as high.  Patient has had minimal contact with staff today.  Patient remains passively suicidal without a specific plan.  He rates his depression and hopelessness as a 9; anxiety as a 9.  His goal today is "to maintain my symptoms and think about good things. A: Continue to monitor medication management and MD orders.  Safety checks completed every 15 minutes per protocol.  Meet 1:1 with patient to discuss concerns and offer encouragement.   R: Patient's behavior is appropriate to situation.

## 2014-06-04 NOTE — B[facility] Group Notes (Signed)
B[facility] LCSW Group Therapy 06/04/2014 1:15 PM Type of Therapy: Group Therapy Participation Level: Active  Participation Quality: Attentive, Sharing and Supportive  Affect: Depressed and Flat  Cognitive: Alert and Oriented  Insight: Developing/Improving and Engaged  Engagement in Therapy: Developing/Improving and Engaged  Modes of Intervention: Activity, Clarification, Confrontation, Discussion, Education, Exploration, Limit-setting, Orientation, Problem-solving, Rapport Building, Reality Testing, Socialization and Support  Summary of Progress/Problems: Patient was attentive and engaged with speaker from [employer]. Patient was attentive to speaker while they shared their story of dealing with mental health and overcoming it. Patient expressed interest in their programs and services and received information on their agency. Patient processed ways they can relate to the speaker.   Casanova Schurman, MSW, LCSWA Clinical Social Worker [facility] [phone]   

## 2014-06-04 NOTE — BHH Group Notes (Signed)
B[facility] Group Notes:  (Nursing/MHT/Case Management/Adjunct)  Date:  06/04/2014  Time:  0900  Type of Therapy:  Nurse Education  Participation Level:  Did Not Attend  Participation Quality:    Affect:    Cognitive:    Insight:    Engagement in Group:    Modes of Intervention:    Summary of Progress/Problems:  Layla BarterWhite, Graviela Nodal L 06/04/2014, 9:24 AM

## 2014-06-04 NOTE — BHH Counselor (Deleted)
B[facility] LCSW Group Therapy 06/04/2014 1:15 PM Type of Therapy: Group Therapy Participation Level: Active  Participation Quality: Attentive, Sharing and Supportive  Affect: Depressed and Flat  Cognitive: Alert and Oriented  Insight: Developing/Improving and Engaged  Engagement in Therapy: Developing/Improving and Engaged  Modes of Intervention: Activity, Clarification, Confrontation, Discussion, Education, Exploration, Limit-setting, Orientation, Problem-solving, Rapport Building, Reality Testing, Socialization and Support  Summary of Progress/Problems: Patient was attentive and engaged with speaker from [employer]. Patient was attentive to speaker while they shared their story of dealing with mental health and overcoming it. Patient expressed interest in their programs and services and received information on their agency. Patient processed ways they can relate to the speaker.   Sherie Dobrowolski, MSW, LCSWA Clinical Social Worker [facility] [phone]   

## 2014-06-04 NOTE — Progress Notes (Signed)
Patient ID: Darren [name], male   DOB: 11/18/1970, 43 y.o.   MRN: [id number] D: Patient reports feeling hopeless and depressed about telephone conversation with girlfriend. Pt reports he had to confess to cheating on girlfriend. Pt reports need to be here but states he wish he was home with his girlfriend. Pt denies withdrawal symptoms. Pt denies SI/HI/AVH and pain. Pt attended evening AA group but left early because of been upset. Cooperative with assessment.    A: Met with pt 1:1. Medications administered as prescribed. Writer encouraged pt to discuss feelings. Pt encouraged to come to staff with any question or concerns.   R: Patient remains safe. He is complaint with medications and denies any adverse reaction.   

## 2014-06-04 NOTE — Progress Notes (Signed)
[facility] MD Progress Note  06/04/2014 5:48 PM Darren [name]  MRN:  [id number] Subjective:  Darren [name] continues to have a hard time. He spoke with his GF who is upset as he is here. She was accusing him of having given her an infectious vaginitis he told her he has has an affair had sex with another guy. States he does not know where are things going to go and is upset as he wants to be with her and take care of his daughter. States he had a hard time sleeping last night thinking and worrying. States he really needs help as he is a mess Principal Problem: Alcohol dependence Diagnosis:   Patient Active Problem List   Diagnosis Date Noted  . 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 Alcohol dependence [F10.20] 06/02/2014  . Panic attacks [F41.0] 06/02/2014  . Coronary artery disease due to lipid rich plaque [I25.10]   . Chest pain [R07.9] 03/25/2014  . Accelerated hypertension [I10] 03/04/2014  . Hypertensive heart disease [I11.9] 03/04/2014  . Tobacco abuse [Z72.0] 03/04/2014  . NSTEMI (non-ST elevated myocardial infarction) [I21.4] 03/02/2014   Total Time spent with patient: 30 minutes   Past Medical History:  Past Medical History  Diagnosis Date  . Hypertension   . Tobacco abuse   . Coronary artery disease 03/24/2014    SINGLE VESSEL CAD  . Anxiety     Past Surgical History  Procedure Laterality Date  . Left heart catheterization with coronary angiogram Bilateral 03/04/2014    Procedure: LEFT HEART CATHETERIZATION WITH CORONARY ANGIOGRAM;  Surgeon: Kathleene Hazel, MD;  Location: [facility] CATH LAB;  Service: Cardiovascular;  Laterality: Bilateral;  . Cardiac catheterization  03/04/2014  . Left heart catheterization with coronary angiogram N/A 03/26/2014    Procedure: LEFT HEART CATHETERIZATION WITH CORONARY ANGIOGRAM;  Surgeon: Lennette Bihari, MD;  Location: [facility] C[facility] LAB;  Service: Cardiovascular;  Laterality: N/A;   Family History:  Family History  Problem Relation Age of  Onset  . Hypertension Mother   . Hypertension Father   . Hypertension Other   . Diabetes Other   . Heart attack Other    Social History:  History  Alcohol Use  . 0.0 oz/week  . 0 Standard drinks or equivalent per week    Comment: occ (rare)     History  Drug Use  . Yes    Comment: Occasional marijuana    History   Social History  . Marital Status: Legally Separated    Spouse Name: N/A  . Number of Children: N/A  . Years of Education: N/A   Social History Main Topics  . Smoking status: Current Every Day Smoker -- 0.50 packs/day for 20 years  . Smokeless tobacco: Never Used  . Alcohol Use: 0.0 oz/week    0 Standard drinks or equivalent per week     Comment: occ (rare)  . Drug Use: Yes     Comment: Occasional marijuana  . Sexual Activity: Not Currently   Other Topics Concern  . None   Social History Narrative   Additional History:    Sleep: Fair  Appetite:  Fair   Assessment:   Musculoskeletal: Strength & Muscle Tone: within normal limits Gait & Station: normal Patient leans: N/A   Psychiatric Specialty Exam: Physical Exam  Review of Systems  Constitutional: Negative.   HENT: Negative.   Eyes: Negative.   Respiratory: Negative.   Cardiovascular: Negative.   Gastrointestinal: Negative.   Genitourinary: Negative.   Musculoskeletal: Negative.  Skin: Negative.   Neurological: Negative.   Endo/Heme/Allergies: Negative.   Psychiatric/Behavioral: Positive for depression and substance abuse. The patient is nervous/anxious.     Blood pressure 126/61, pulse 64, temperature 97.6 F (36.4 C), temperature source Tympanic, resp. rate 18, height  (1.702 m), weight 68.04 kg (150 lb).Body mass index is 23.49 kg/(m^2).  General Appearance: Fairly Groomed  Patent attorney::  Fair  Speech:  Clear and Coherent  Volume:  Decreased  Mood:  Anxious and Depressed  Affect:  Depressed and anxious worried  Thought Process:  Coherent and Goal Directed  Orientation:   Full (Time, Place, and Person)  Thought Content:  symptoms events worries concerns  Suicidal Thoughts:  No  Homicidal Thoughts:  No  Memory:  Immediate;   Fair Recent;   Fair Remote;   Fair  Judgement:  Fair  Insight:  Present and Shallow  Psychomotor Activity:  Restlessness  Concentration:  Fair  Recall:  [employer] of Knowledge:Fair  Language: Fair  Akathisia:  No  Handed:  Right  AIMS (if indicated):     Assets:  Desire for Improvement Vocational/Educational  ADL's:  Intact  Cognition: WNL  Sleep:  Number of Hours: 6.5     Current Medications: Current Facility-Administered Medications  Medication Dose Route Frequency Provider Last Rate Last Dose  . acetaminophen (TYLENOL) tablet 650 mg  650 mg Oral Q6H PRN Kerry Hough, PA-C   650 mg at 06/02/14 1703  . alum & mag hydroxide-simeth (MAALOX/MYLANTA) 200-200-20 MG/5ML suspension 30 mL  30 mL Oral Q4H PRN Kerry Hough, PA-C      . aspirin EC tablet 81 mg  81 mg Oral Daily Kerry Hough, PA-C   81 mg at 06/03/14 0835  . atorvastatin (LIPITOR) tablet 80 mg  80 mg Oral q1800 Kerry Hough, PA-C   80 mg at 06/04/14 1702  . chlordiazePOXIDE (LIBRIUM) capsule 25 mg  25 mg Oral Q6H PRN Kerry Hough, PA-C   25 mg at 06/02/14 1952  . chlordiazePOXIDE (LIBRIUM) capsule 25 mg  25 mg Oral BH-qamhs Spencer E Simon, PA-C   25 mg at 06/04/14 0800   Followed by  . [START ON 06/05/2014] chlordiazePOXIDE (LIBRIUM) capsule 25 mg  25 mg Oral Daily Kerry Hough, PA-C      . clopidogrel (PLAVIX) tablet 75 mg  75 mg Oral Daily Kerry Hough, PA-C   75 mg at 06/03/14 0836  . diazepam (VALIUM) tablet 5 mg  5 mg Oral Daily PRN Kerry Hough, PA-C   5 mg at 06/02/14 1703  . escitalopram (LEXAPRO) tablet 20 mg  20 mg Oral Daily Kerry Hough, PA-C   20 mg at 06/03/14 0835  . gabapentin (NEURONTIN) capsule 100 mg  100 mg Oral TID Rachael Fee, MD   100 mg at 06/04/14 1703  . hydrochlorothiazide (MICROZIDE) capsule 12.5 mg  12.5 mg  Oral Daily Kerry Hough, PA-C   12.5 mg at 06/03/14 4098  . hydrOXYzine (ATARAX/VISTARIL) tablet 25 mg  25 mg Oral Q6H PRN Kerry Hough, PA-C   25 mg at 06/03/14 2148  . loperamide (IMODIUM) capsule 2-4 mg  2-4 mg Oral PRN Kerry Hough, PA-C      . magnesium hydroxide (MILK OF MAGNESIA) suspension 30 mL  30 mL Oral Daily PRN Kerry Hough, PA-C      . metoprolol tartrate (LOPRESSOR) tablet 12.5 mg  12.5 mg Oral BID Kerry Hough, PA-C  12.5 mg at 06/04/14 1704  . multivitamin with minerals tablet 1 tablet  1 tablet Oral Daily Kerry HoughSpencer E Simon, PA-C   1 tablet at 06/03/14 [id number]  . nicotine (NICODERM CQ - dosed in mg/24 hours) patch 21 mg  21 mg Transdermal Daily Kerry HoughSpencer E Simon, PA-C   21 mg at 06/03/14 0836  . nitroGLYCERIN (NITROSTAT) SL tablet 0.4 mg  0.4 mg Sublingual Q5 Min x 3 PRN Kerry HoughSpencer E Simon, PA-C      . ondansetron (ZOFRAN-ODT) disintegrating tablet 4 mg  4 mg Oral Q6H PRN Kerry HoughSpencer E Simon, PA-C      . pantoprazole (PROTONIX) EC tablet 40 mg  40 mg Oral Daily Kerry HoughSpencer E Simon, PA-C   40 mg at 06/03/14 0836  . thiamine (B-1) injection 100 mg  100 mg Intramuscular Once Kerry HoughSpencer E Simon, PA-C   100 mg at 06/02/14 0230  . thiamine (VITAMIN B-1) tablet 100 mg  100 mg Oral Daily Kerry HoughSpencer E Simon, PA-C   100 mg at 06/03/14 [id number]  . traZODone (DESYREL) tablet 50 mg  50 mg Oral QHS,MR X 1 Kerry HoughSpencer E Simon, PA-C   50 mg at 06/03/14 2148    Lab Results:  Results for orders placed or performed during the [name] encounter of 06/02/14 (from the past 48 hour(s))  HIV antibody     Status: None   Collection Time: 06/02/14  7:25 PM  Result Value Ref Range   HIV Screen 4th Generation wRfx Non Reactive Non Reactive    Comment: (NOTE) Performed At: [facility] [address] [place], [place] [id number] Mila HomerHancock William F MD YN:[id number]:[phone] Performed at [name]   RPR     Status: None   Collection Time: 06/02/14  7:25 PM  Result Value Ref Range   RPR Ser Ql Non  Reactive Non Reactive    Comment: (NOTE) Performed At: [facility] [place] [address] [place], [place] [id number] Mila HomerHancock William F MD XB:[phone]:[phone] Performed at [name]     Physical Findings: AIMS: Facial and Oral Movements Muscles of Facial Expression: None, normal Lips and Perioral Area: None, normal Jaw: None, normal Tongue: None, normal,Extremity Movements Upper (arms, wrists, hands, fingers): None, normal Lower (legs, knees, ankles, toes): None, normal, Trunk Movements Neck, shoulders, hips: None, normal, Overall Severity Severity of abnormal movements (highest score from questions above): None, normal Incapacitation due to abnormal movements: None, normal Patient's awareness of abnormal movements (rate only patient's report): No Awareness, Dental Status Current problems with teeth and/or dentures?: No Does patient usually wear dentures?: Yes (top dentures)  CIWA:  CIWA-Ar Total: 5 COWS:     Treatment Plan Summary: Daily contact with patient to assess and evaluate symptoms and progress in treatment and Medication management Alcohol Dependence: continue the detox protocol and work a relapse prevention plan Anxiety/panic; continue to work with the Lexapro/Valium/Vistaril, use CBT/mindfulness Family Stress; work towards improving communication, deal with trust issues Medical Decision Making:  Review of Psycho-Social Stressors (1), Review of Medication Regimen & Side Effects (2) and Review of New Medication or Change in Dosage (2)     Maridel Pixler A 06/04/2014, 5:48 PM

## 2014-06-04 NOTE — [facility] Suicide Risk Assessment (Signed)
[facility] INPATIENT:  Family/Significant Other Suicide Prevention Education  Suicide Prevention Education:  Education Completed; father Benancio Deedsate McAdoo [phone],  (name of family member/significant other) has been identified by the patient as the family member/significant other with whom the patient will be residing, and identified as the person(s) who will aid the patient in the event of a mental health crisis (suicidal ideations/suicide attempt).  With written consent from the patient, the family member/significant other has been provided the following suicide prevention education, prior to the and/or following the discharge of the patient.  The suicide prevention education provided includes the following:  Suicide risk factors  Suicide prevention and interventions  National Suicide Hotline telephone number  [facility] assessment telephone number  [facility] Emergency Assistance 911  [facility] and/or Residential Mobile Crisis Unit telephone number  Request made of family/significant other to:  Remove weapons (e.g., guns, rifles, knives), all items previously/currently identified as safety concern.    Remove drugs/medications (over-the-counter, prescriptions, illicit drugs), all items previously/currently identified as a safety concern.  The family member/significant other verbalizes understanding of the suicide prevention education information provided.  The family member/significant other agrees to remove the items of safety concern listed above.  Katherina Wimer, [place] CarboKristin L 06/04/2014, 3:54 PM

## 2014-06-04 NOTE — Plan of Care (Signed)
Problem: Alteration in mood Goal: STG-Patient reports thoughts of self-harm to staff Outcome: Progressing Pt upset about phone call to girlfriend but denies thoughts of self harm

## 2014-06-05 ENCOUNTER — Encounter (HOSPITAL_COMMUNITY): Payer: BLUE CROSS/BLUE SHIELD

## 2014-06-05 NOTE — Progress Notes (Signed)
Darren [name] is more visible and less isolative today. HE takes his meds as planned and he is able to tell this nurse that he has become aware of people and situations that he has perceived as stressors. In making this identification, this nurse asks him if he can identify a positive coping skill he has to help him handle these stressors.    A HE completes his morning  Assessment and on it he writes he denies SI and he rates his depression, hopelessness and anxiety " 9/9/9", respectively and he says " I gotta learn how not to let people get under my skin!!!".   R Safety  Is in place and poc cont.

## 2014-06-05 NOTE — Progress Notes (Signed)
Adult Psychoeducational Group Note  Date:  06/05/2014 Time:  8:00pm Group Topic/Focus:  Wrap-Up Group:   The focus of this group is to help patients review their daily goal of treatment and discuss progress on daily workbooks.  Participation Level:  Active  Participation Quality:  Appropriate and Attentive  Affect:  Appropriate  Cognitive:  Alert and Appropriate  Insight: Appropriate  Engagement in Group:  Engaged  Modes of Intervention:  Discussion  Additional Comments:  Pt was attentive and appropriate during tonights wrap up group with AA. Pt shared drug and alcohol use. Pt stated that he ned to change his peer and recovery is something that is going be for the rest of his life.   Bing PlumeScott, Selah Klang D 06/05/2014, 10:01 PM

## 2014-06-05 NOTE — [facility] Group Notes (Signed)
[facility] LCSW Group Therapy 06/05/2014 1:15 PM Type of Therapy: Group Therapy Participation Level: Active  Participation Quality: Attentive, Sharing and Supportive  Affect: Depressed and Flat  Cognitive: Alert and Oriented  Insight: Developing/Improving and Engaged  Engagement in Therapy: Developing/Improving and Engaged  Modes of Intervention: Clarification, Confrontation, Discussion, Education, Exploration, Limit-setting, Orientation, Problem-solving, Rapport Building, Dance movement psychotherapisteality Testing, Socialization and Support  Summary of Progress/Problems: The topic for today was feelings about relapse. Pt discussed what relapse prevention is to them and identified triggers that they are on the path to relapse. Pt processed their feeling towards relapse and was able to relate to peers. Pt discussed coping skills that can be used for relapse prevention. Patient identified his relapse behavior as not taking care of himself and using drugs/alcohol. Patient engaged in discussion about relapse behaviors and recovery factors. Patient reported not feeling supported by his child's mother regarding his hospitalization and shared that he felt conflicted about leaving treatment early. CSW and other group members provided emotional support and encouragement.   Samuella BruinKristin Maliha Outten, MSW, [employer]CSWA Clinical Social Worker [facility] [phone]

## 2014-06-05 NOTE — Plan of Care (Signed)
Problem: Alteration in mood & ability to function due to Goal: STG-Patient will comply with prescribed medication regimen (Patient will comply with prescribed medication regimen)  Outcome: Progressing Pt reports feeling better and has been compliant with scheduled medication regimen

## 2014-06-05 NOTE — Tx Team (Signed)
Interdisciplinary Treatment Plan Update (Adult) Date: 06/05/2014   Time Reviewed: 9:30 AM  Progress in Treatment: Attending groups: Yes Participating in groups: Yes Taking medication as prescribed: Yes Tolerating medication: Yes Family/Significant other contact made: Yes, CSW has spoken with patient's father and ex-girlfriend Patient understands diagnosis: Yes Discussing patient identified problems/goals with staff: Yes Medical problems stabilized or resolved: Yes Denies suicidal/homicidal ideation: Endorses passive SI today but contracts for safety Issues/concerns per patient self-inventory: Yes Other:  New problem(s) identified: N/A  Discharge Plan or Barriers:   4/8: Patient continues to endorse high levels of depression and anxiety, as well as passive SI although he contracts for safety. Patient plans to return to father's home to follow up with outpatient services with his PCP [name] and [facility].   Reason for Continuation of Hospitalization:  Depression Anxiety Medication Stabilization   Comments: N/A  Estimated length of stay: 2-3 days  For review of initial/current patient goals, please see plan of care. Patient is a 44 year old African American Male admitted for SI and depression. [place] with his father. Patient will benefit from crisis stabilization, medication evaluation, group therapy, and psycho education in addition to case management for discharge planning. Patient and CSW reviewed pt's identified goals and treatment plan. Pt verbalized understanding and agreed to treatment plan.   Attendees: Patient:    Family:    Physician: Dr. Jama Flavorsobos; Dr. Dub MikesLugo; Dr. Jama Flavorsobos; Dr. Elna BreslowEappen  06/05/2014 9:30 AM  Nursing: Shelda JakesPatty Duke, Omelia BlackwaterNoelle Ardley, Bebe LiterKim Okafore, Marion Friedman RN 06/05/2014 9:30 AM  Clinical Social Worker: Belenda CruiseKristin Digna Countess,  LCSWA 06/05/2014 9:30 AM  Other: Juline PatchQuylle Hodnett, LCSW 06/05/2014 9:30 AM  Other: Leisa LenzValerie Enoch,  Vesta [facility] Liaison 06/05/2014 9:30 AM  Other: Onnie BoerJennifer Clark, Case Manager 06/05/2014 9:30 AM  Other: Mosetta AnisAggie Nwoko, Laura Davis NP 06/05/2014 9:30 AM  Other: Trula SladeHeather Smart, LCSW 06/05/2014 9:30 AM  Other:    Other:       Scribe for Treatment Team:  Samuella BruinKristin Mable Dara, MSW, [employer]CSWA [phone]

## 2014-06-05 NOTE — Plan of Care (Signed)
Problem: Diagnosis: Increased Risk For Suicide Attempt Goal: STG-Patient Will Comply With Medication Regime Outcome: Progressing Pt compliant with scheduled medication regime this evening

## 2014-06-05 NOTE — Progress Notes (Signed)
Patient ID: Darren [name], male   DOB: 1970-03-26, 44 y.o.   MRN: [id number] D: Patient reports he had a positive conversation with girlfriend today. Pt mood and affect has been pleasant. Pt is interacting more with peers and staff. Pt minimizes his drinking habit, reports no withdrawal symptoms. Cooperative with assessment.    A: Met with pt 1:1. Medications administered as prescribed. Writer encouraged pt to discuss feelings. Pt encouraged to come to staff with any question or concerns.   R: Patient remains safe. Pt denies SI/HI/AVH and pain. He is complaint with medications.

## 2014-06-05 NOTE — Progress Notes (Signed)
Recreation Therapy Notes  Date: 04.08.2016 Time: 9:30am Location: [address] Group Room   Group Topic: Stress Management  Goal Area(s) Addresses:  Patient will actively participate in stress management techniques presented during session.   Behavioral Response: Did not attend.   Bretton Tandy L Aireona Torelli, LRT/CTRS  Jimi Schappert L 06/05/2014 5:59 PM 

## 2014-06-05 NOTE — Progress Notes (Signed)
Adult Psychoeducational Group Note  Date:  06/05/2014 Time:  10:48 AM  Group Topic/Focus:  Self Care:   The focus of this group is to help patients understand the importance of self-care in order to improve or restore emotional, physical, spiritual, interpersonal, and financial health.  Participation Level:  Active  Participation Quality:  Appropriate  Affect:  Appropriate  Cognitive:  Appropriate  Insight: Appropriate  Engagement in Group:  Engaged  Modes of Intervention:  Activity  Additional Comments:  Student nurses used meditation through music to identify if patient's anxiety improved.  Wynema BirchCagle, Guido Comp D 06/05/2014, 10:48 AM

## 2014-06-05 NOTE — [facility] Group Notes (Signed)
   [facility] Note  06/05/2014  8:45 AM   Participation Quality: Alert, Appropriate and Oriented  Mood/Affect: Depressed and Flat  Depression Rating: 9  Anxiety Rating: 9  Thoughts of Suicide: Pt endorses passive SI but contracts for safety  Will you contract for safety? Yes  Current AVH: Pt denies  Plan for Discharge/Comments: Pt attended discharge planning group and actively participated in group. CSW provided pt with today's workbook. Patient reports experiencing high levels of depression and anxiety related to relationship issues with his child's mother. Patient plans to return home at discharge and will follow up with his PCP Ma HillockNoelle [name] and [name].  Transportation Means: Pt reports access to transportation  Supports: Patient identifies his father and father's girlfriend as supportive.  Darren BruinKristin Leanndra [name], MSW, [employer]CSWA Clinical Social Worker [facility] [phone]
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
## 2014-06-06 DIAGNOSIS — F419 Anxiety disorder, unspecified: Secondary | ICD-10-CM

## 2014-06-06 DIAGNOSIS — F329 Major depressive disorder, single episode, unspecified: Secondary | ICD-10-CM

## 2014-06-06 DIAGNOSIS — F410 Panic disorder [episodic paroxysmal anxiety] without agoraphobia: Secondary | ICD-10-CM

## 2014-06-06 DIAGNOSIS — Y909 Presence of alcohol in blood, level not specified: Secondary | ICD-10-CM

## 2014-06-06 DIAGNOSIS — F1020 Alcohol dependence, uncomplicated: Principal | ICD-10-CM

## 2014-06-06 NOTE — Progress Notes (Signed)
[facility] Group Notes:  (Nursing/MHT/Case Management/Adjunct)  Date:  06/06/2014  Time:  2100  Type of Therapy:  wrap up group  Participation Level:  Active  Participation Quality:  Appropriate, Attentive, Sharing and Supportive  Affect:  Appropriate  Cognitive:  Appropriate  Insight:  Appropriate  Engagement in Group:  Engaged  Modes of Intervention:  Clarification, Education and Support  Summary of Progress/Problems: Pt reports being in hell, which he describes as depression, anxiety, and mood swings. Pt shared that he wants to get on the straight and narrow, get a counselor, be a better parent to his 5 kids, and a better son.    Shelah LewandowskySquires, Girard Koontz Carol 06/06/2014, 10:52 PM

## 2014-06-06 NOTE — Progress Notes (Signed)
.  Psychoeducational Group Note    Date: 06/06/2014 Time:  0930    Goal Setting Purpose of Group: To be able to set a goal that is measurable and that can be accomplished in one day Participation Level: did not attend  Dione HousekeeperJudge, Annaka Cleaver A

## 2014-06-06 NOTE — Progress Notes (Signed)
Patient ID: Darren [name], male   DOB: [birth year], 44 y.o.   MRN: [id number] D  --  Pt. Has maintained  calm, pleasant and friendly on unit today.   He has had anxiety and times but has handled it well.  He has attended all groups with good participation.  He appears to enjoy going to groups and interacting with peers.  Pt. Appears strongly vested in treatment.  He has agreed to contract for safety .  --- A --  Support, encouragement and medications provided. --- R --  Pt. Remains safe and friendly on the unit

## 2014-06-06 NOTE — Progress Notes (Signed)
Patient ID: Darren [name], male   DOB: 05/27/1970, 44 y.o.   MRN: [id number] [facility]  06/06/2014 3:02 PM Darren [name]  MRN:  [id number]  Subjective:  Darren [name] continues to deal with the situation with his girlfriend. He says today that the communication he had with his girl-friend this afternoon seem positive. He says it looks like they are moving into a new & better direction to get their relationship going again. Says he can't wait for things to get back to how it was prior to their disagreement. He rates his depression at #5 today. Denies any SIHI, AVH. Denies any new issues.  Principal Problem: Alcohol dependence Diagnosis:   Patient Active Problem List   Diagnosis Date Noted  . 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 Alcohol dependence [F10.20] 06/02/2014  . Panic attacks [F41.0] 06/02/2014  . Coronary artery disease due to lipid rich plaque [I25.10]   . Chest pain [R07.9] 03/25/2014  . Accelerated hypertension [I10] 03/04/2014  . Hypertensive heart disease [I11.9] 03/04/2014  . Tobacco abuse [Z72.0] 03/04/2014  . NSTEMI (non-ST elevated myocardial infarction) [I21.4] 03/02/2014   Total Time spent with patient: 25 minutes  Past Medical History:  Past Medical History  Diagnosis Date  . Hypertension   . Tobacco abuse   . Coronary artery disease 03/24/2014    SINGLE VESSEL CAD  . Anxiety     Past Surgical History  Procedure Laterality Date  . Left heart catheterization with coronary angiogram Bilateral 03/04/2014    Procedure: LEFT HEART CATHETERIZATION WITH CORONARY ANGIOGRAM;  Surgeon: Kathleene Hazelhristopher D McAlhany, MD;  [facility];  Service: Cardiovascular;  Laterality: Bilateral;  . Cardiac catheterization  03/04/2014  . Left heart catheterization with coronary angiogram N/A 03/26/2014    Procedure: LEFT HEART CATHETERIZATION WITH CORONARY ANGIOGRAM;  Surgeon: Lennette Biharihomas A Kelly, MD;  [facility];  Service: Cardiovascular;  Laterality: N/A;    Family History:  Family History  Problem Relation Age of Onset  . Hypertension Mother   . Hypertension Father   . Hypertension Other   . Diabetes Other   . Heart attack Other    Social History:  History  Alcohol Use  . 0.0 oz/week  . 0 Standard drinks or equivalent per week    Comment: occ (rare)     History  Drug Use  . Yes    Comment: Occasional marijuana    History   Social History  . Marital Status: Legally Separated    Spouse Name: N/A  . Number of Children: N/A  . Years of Education: N/A   Social History Main Topics  . Smoking status: Current Every Day Smoker -- 0.50 packs/day for 20 years  . Smokeless tobacco: Never Used  . Alcohol Use: 0.0 oz/week    0 Standard drinks or equivalent per week     Comment: occ (rare)  . Drug Use: Yes     Comment: Occasional marijuana  . Sexual Activity: Not Currently   Other Topics Concern  . None   Social History Narrative   Additional History:    Sleep: Fair  Appetite:  Fair  Assessment: Objective:  Patient is seen and chart is reviewed. Darren [name] is visible on the unit. He is participating in group sessions. He rates depression at #5 today. He is denies suicidal/homicidal thoughts, auditory/visual hallucinations. Darren [name] is compliant with his current medications and has not verbalized any adverse reactions.  Musculoskeletal: Strength & Muscle Tone: within normal limits Gait & Station:  normal Patient leans: N/A  Psychiatric Specialty Exam: Physical Exam  Review of Systems  Constitutional: Negative.   HENT: Negative.   Eyes: Negative.   Cardiovascular: Negative.   Gastrointestinal: Negative.   Genitourinary: Negative.   Skin: Negative.   Endo/Heme/Allergies: Negative.   Psychiatric/Behavioral: Positive for depression (Improving) and substance abuse (Hx. alcoholism). Negative for suicidal ideas, hallucinations and memory loss. The patient has insomnia (Improving). The patient is not nervous/anxious.      Blood pressure 133/85, pulse 67, temperature 97.8 F (36.6 C), temperature source Oral, resp. rate 18, height  (1.702 m), weight 68.04 kg (150 lb).Body mass index is 23.49 kg/(m^2).  General Appearance: Fairly Groomed  Patent attorney::  Fair  Speech:  Clear and Coherent  Volume:  Normal  Mood:  Anxious, Depressed and worried  Affect:  anxious worried  Thought Process:  Coherent and Goal Directed  Orientation:  Full (Time, Place, and Person)  Thought Content:  symptoms events worries concerns  Suicidal Thoughts:  No  Homicidal Thoughts:  No  Memory:  Immediate;   Fair Recent;   Fair Remote;   Fair  Judgement:  Fair  Insight:  Present  Psychomotor Activity:  Normal  Concentration:  Fair  Recall:  [employer] of Knowledge:Fair  Language: Fair  Akathisia:  No  Handed:  Right  AIMS (if indicated):     Assets:  Desire for Improvement Housing Vocational/Educational  ADL's:  Intact  Cognition: WNL  Sleep:  Number of Hours: 6   Current Medications: Current Facility-Administered Medications  Medication Dose Route Frequency Provider Last Rate Last Dose  . acetaminophen (TYLENOL) tablet 650 mg  650 mg Oral Q6H PRN Kerry Hough, PA-C   650 mg at 06/02/14 1703  . alum & mag hydroxide-simeth (MAALOX/MYLANTA) 200-200-20 MG/5ML suspension 30 mL  30 mL Oral Q4H PRN Kerry Hough, PA-C      . aspirin EC tablet 81 mg  81 mg Oral Daily Kerry Hough, PA-C   81 mg at 06/06/14 0750  . atorvastatin (LIPITOR) tablet 80 mg  80 mg Oral q1800 Kerry Hough, PA-C   80 mg at 06/05/14 1655  . clopidogrel (PLAVIX) tablet 75 mg  75 mg Oral Daily Kerry Hough, PA-C   75 mg at 06/06/14 0748  . diazepam (VALIUM) tablet 5 mg  5 mg Oral Daily PRN Kerry Hough, PA-C   5 mg at 06/02/14 1703  . escitalopram (LEXAPRO) tablet 20 mg  20 mg Oral Daily Kerry Hough, PA-C   20 mg at 06/06/14 0748  . gabapentin (NEURONTIN) capsule 100 mg  100 mg Oral TID Rachael Fee, MD   100 mg at 06/06/14 1208   . hydrochlorothiazide (MICROZIDE) capsule 12.5 mg  12.5 mg Oral Daily Kerry Hough, PA-C   12.5 mg at 06/06/14 0748  . magnesium hydroxide (MILK OF MAGNESIA) suspension 30 mL  30 mL Oral Daily PRN Kerry Hough, PA-C      . metoprolol tartrate (LOPRESSOR) tablet 12.5 mg  12.5 mg Oral BID Kerry Hough, PA-C   12.5 mg at 06/06/14 0747  . multivitamin with minerals tablet 1 tablet  1 tablet Oral Daily Kerry Hough, PA-C   1 tablet at 06/06/14 0748  . nicotine (NICODERM CQ - dosed in mg/24 hours) patch 21 mg  21 mg Transdermal Daily Kerry Hough, PA-C   21 mg at 06/06/14 0750  . nitroGLYCERIN (NITROSTAT) SL tablet 0.4 mg  0.4 mg Sublingual  Q5 Min x 3 PRN Kerry Hough, PA-C      . pantoprazole (PROTONIX) EC tablet 40 mg  40 mg Oral Daily Kerry Hough, PA-C   40 mg at 06/06/14 0748  . thiamine (B-1) injection 100 mg  100 mg Intramuscular Once Kerry Hough, PA-C   100 mg at 06/02/14 0230  . thiamine (VITAMIN B-1) tablet 100 mg  100 mg Oral Daily Kerry Hough, PA-C   100 mg at 06/06/14 0748  . traZODone (DESYREL) tablet 50 mg  50 mg Oral QHS,MR X 1 Kerry Hough, PA-C   50 mg at 06/05/14 2123    Lab Results: No results found for this or any previous visit (from the past 48 hour(s)).  Physical Findings: AIMS: Facial and Oral Movements Muscles of Facial Expression: None, normal Lips and Perioral Area: None, normal Jaw: None, normal Tongue: None, normal,Extremity Movements Upper (arms, wrists, hands, fingers): None, normal Lower (legs, knees, ankles, toes): None, normal, Trunk Movements Neck, shoulders, hips: None, normal, Overall Severity Severity of abnormal movements (highest score from questions above): None, normal Incapacitation due to abnormal movements: None, normal Patient's awareness of abnormal movements (rate only patient's report): No Awareness, Dental Status Current problems with teeth and/or dentures?: No Does patient usually wear dentures?: No  CIWA:   CIWA-Ar Total: 3 COWS:     Treatment Plan Summary: Daily contact with patient to assess and evaluate symptoms and progress in treatment and Medication management: Supportive approach/coping skills Alcohol Dependence: continue to wean off the Librium while maintaining the valium for high anxiety levels.  Anxiety/panic; continue the Lexapro/Valium/Neurontin combination. Will increase the Neurontin to 200 mg TID and reassess Conflict with GF; will continue to work on improving communications skills and with CBT/mindfulness to address the anxiety when he talks to her.  Medical Decision Making:  Review of Psycho-Social Stressors (1), Review of Medication Regimen & Side Effects (2) and Review of New Medication or Change in Dosage (2)  Sanjuana Kava, PMHNP, FNP-BC 06/06/2014, 3:02 PM I agreed with findings and treatment plan of this patient

## 2014-06-06 NOTE — [facility] Group Notes (Signed)
[facility] Group Notes:  (Clinical Social Work)  06/06/2014   1:15-2:15PM  Summary of Progress/Problems:   The main focus of today's process group was for the patient to identify ways in which they have sabotaged their own mental health wellness/recovery.  Motivational interviewing and a handout were used to explore the benefits and costs of their self-sabotaging behavior as well as the benefits and costs of changing this behavior.  The Stages of Change were explained to the group using a handout, and patients identified where they are with regard to changing self-defeating behaviors.  The patient expressed that [facility], he has used the healthy coping mechanisms of expressing himself in group as well as in 1:1 sessions with the doctor.  He also at home uses laughter, honesty and prayer.  [name] was one of the most vocal people during group and talked about what has caused past relapses, how he plans to avoid that now.  He was on the phone with his girlfriend when group started, and stated that he has informed her that he is going to pay the bills when he gets out of the hospital, then turn over all remaining money to her because if he has it, it will ultimately cause a relapse.  She has agreed to do this.  Type of Therapy:  Process Group  Participation Level:  Active  Participation Quality:  Attentive, Sharing and Supportive  Affect:  Blunted  Cognitive:  Appropriate and Oriented  Insight:  Engaged  Engagement in Therapy:  Engaged  Modes of Intervention:  Education, Motivational Interviewing   Ambrose MantleMareida Grossman-Orr, LCSW 06/06/2014, 4:00pm

## 2014-06-06 NOTE — Progress Notes (Signed)
Psychoeducational Group Note  Date: 06/06/2014 Time:  1015  Group Topic/Focus:  Identifying Needs:   The focus of this group is to help patients identify their personal needs that have been historically problematic and identify healthy behaviors to address their needs.  Participation Level:  Active  Participation Quality:  Appropriate  Affect:  Appropriate  Cognitive:  Oriented  Insight:  Improving  Engagement in Group:  Engaged  Additional Comments:  Pt attended this group and participated   Baldwin Racicot A 

## 2014-06-07 DIAGNOSIS — F10230 Alcohol dependence with withdrawal, uncomplicated: Secondary | ICD-10-CM | POA: Insufficient documentation

## 2014-06-07 NOTE — Progress Notes (Signed)
Patient ID: Darren [name], male   DOB: [birth year], 44 y.o.   MRN: [id number] Patient ID: Darren [name], male   DOB: [birth year], 44 y.o.   MRN: [id number] [facility] MD Progress Note  06/07/2014 1:43 PM Darren [name]  MRN:  [id number]  Subjective:  Darren [name] says he is doing well today. He also states he is feeling happy & relieved as things continue to work out between him & girlfriend. He says he feels he is ready to go home. He denies any new issues and or concerns.  Principal Problem: Alcohol dependence Diagnosis:   Patient Active Problem List   Diagnosis Date Noted  . 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 Alcohol dependence [F10.20] 06/02/2014  . Panic attacks [F41.0] 06/02/2014  . Coronary artery disease due to lipid rich plaque [I25.10]   . Chest pain [R07.9] 03/25/2014  . Accelerated hypertension [I10] 03/04/2014  . Hypertensive heart disease [I11.9] 03/04/2014  . Tobacco abuse [Z72.0] 03/04/2014  . NSTEMI (non-ST elevated myocardial infarction) [I21.4] 03/02/2014   Total Time spent with patient: 25 minutes  Past Medical History:  Past Medical History  Diagnosis Date  . Hypertension   . Tobacco abuse   . Coronary artery disease 03/24/2014    SINGLE VESSEL CAD  . Anxiety     Past Surgical History  Procedure Laterality Date  . Left heart catheterization with coronary angiogram Bilateral 03/04/2014    Procedure: LEFT HEART CATHETERIZATION WITH CORONARY ANGIOGRAM;  Surgeon: Kathleene Hazel, MD;  Location: [facility] CATH LAB;  Service: Cardiovascular;  Laterality: Bilateral;  . Cardiac catheterization  03/04/2014  . Left heart catheterization with coronary angiogram N/A 03/26/2014    Procedure: LEFT HEART CATHETERIZATION WITH CORONARY ANGIOGRAM;  Surgeon: Lennette Bihari, MD;  Location: [facility] CATH LAB;  Service: Cardiovascular;  Laterality: N/A;   Family History:  Family History  Problem Relation Age of Onset  . Hypertension Mother   . Hypertension Father   .  Hypertension Other   . Diabetes Other   . Heart attack Other    Social History:  History  Alcohol Use  . 0.0 oz/week  . 0 Standard drinks or equivalent per week    Comment: occ (rare)     History  Drug Use  . Yes    Comment: Occasional marijuana    History   Social History  . Marital Status: Legally Separated    Spouse Name: N/A  . Number of Children: N/A  . Years of Education: N/A   Social History Main Topics  . Smoking status: Current Every Day Smoker -- 0.50 packs/day for 20 years  . Smokeless tobacco: Never Used  . Alcohol Use: 0.0 oz/week    0 Standard drinks or equivalent per week     Comment: occ (rare)  . Drug Use: Yes     Comment: Occasional marijuana  . Sexual Activity: Not Currently   Other Topics Concern  . None   Social History Narrative   Additional History:    Sleep: Fair  Appetite:  Fair  Assessment: Objective:  Patient is seen and chart is reviewed. Darren [name] is visible on the unit. He is participating in group sessions. He rates depression at #2 today. He is denies suicidal/homicidal thoughts, auditory/visual hallucinations. Chief is compliant with his current medications and has not verbalized any adverse reactions. He is looking forward to being discharged in am.  Musculoskeletal: Strength & Muscle Tone: within normal limits Gait & Station: normal Patient leans: N/A  Psychiatric Specialty Exam:  Physical Exam  ROS  Blood pressure 138/75, pulse 82, temperature 97.5 F (36.4 C), temperature source Oral, resp. rate 16, height  (1.702 m), weight 68.04 kg (150 lb).Body mass index is 23.49 kg/(m^2).  General Appearance: Fairly Groomed  Patent attorney::  Fair  Speech:  Clear and Coherent  Volume:  Normal  Mood:  Anxious, Depressed and worried  Affect:  anxious worried  Thought Process:  Coherent and Goal Directed  Orientation:  Full (Time, Place, and Person)  Thought Content:  symptoms events worries concerns  Suicidal Thoughts:  No   Homicidal Thoughts:  No  Memory:  Immediate;   Fair Recent;   Fair Remote;   Fair  Judgement:  Fair  Insight:  Present  Psychomotor Activity:  Normal  Concentration:  Fair  Recall:  [employer] of Knowledge:Fair  Language: Fair  Akathisia:  No  Handed:  Right  AIMS (if indicated):     Assets:  Desire for Improvement Housing Vocational/Educational  ADL's:  Intact  Cognition: WNL  Sleep:  Number of Hours: 4.5   Current Medications: Current Facility-Administered Medications  Medication Dose Route Frequency Provider Last Rate Last Dose  . acetaminophen (TYLENOL) tablet 650 mg  650 mg Oral Q6H PRN Kerry Hough, PA-C   650 mg at 06/02/14 1703  . alum & mag hydroxide-simeth (MAALOX/MYLANTA) 200-200-20 MG/5ML suspension 30 mL  30 mL Oral Q4H PRN Kerry Hough, PA-C      . aspirin EC tablet 81 mg  81 mg Oral Daily Kerry Hough, PA-C   81 mg at 06/07/14 1209  . atorvastatin (LIPITOR) tablet 80 mg  80 mg Oral q1800 Kerry Hough, PA-C   80 mg at 06/06/14 1807  . clopidogrel (PLAVIX) tablet 75 mg  75 mg Oral Daily Kerry Hough, PA-C   75 mg at 06/07/14 1209  . diazepam (VALIUM) tablet 5 mg  5 mg Oral Daily PRN Kerry Hough, PA-C   5 mg at 06/06/14 1620  . escitalopram (LEXAPRO) tablet 20 mg  20 mg Oral Daily Kerry Hough, PA-C   20 mg at 06/07/14 1209  . gabapentin (NEURONTIN) capsule 100 mg  100 mg Oral TID Rachael Fee, MD   100 mg at 06/07/14 1209  . hydrochlorothiazide (MICROZIDE) capsule 12.5 mg  12.5 mg Oral Daily Kerry Hough, PA-C   12.5 mg at 06/07/14 1209  . magnesium hydroxide (MILK OF MAGNESIA) suspension 30 mL  30 mL Oral Daily PRN Kerry Hough, PA-C      . metoprolol tartrate (LOPRESSOR) tablet 12.5 mg  12.5 mg Oral BID Kerry Hough, PA-C   12.5 mg at 06/06/14 1640  . multivitamin with minerals tablet 1 tablet  1 tablet Oral Daily Kerry Hough, PA-C   1 tablet at 06/07/14 1209  . nicotine (NICODERM CQ - dosed in mg/24 hours) patch 21 mg  21 mg  Transdermal Daily Kerry Hough, PA-C   21 mg at 06/07/14 1209  . nitroGLYCERIN (NITROSTAT) SL tablet 0.4 mg  0.4 mg Sublingual Q5 Min x 3 PRN Kerry Hough, PA-C      . pantoprazole (PROTONIX) EC tablet 40 mg  40 mg Oral Daily Kerry Hough, PA-C   40 mg at 06/07/14 1209  . thiamine (B-1) injection 100 mg  100 mg Intramuscular Once Kerry Hough, PA-C   100 mg at 06/02/14 0230  . thiamine (VITAMIN B-1) tablet 100 mg  100 mg Oral Daily Karleen Hampshire  E Simon, PA-C   100 mg at 06/07/14 1209  . traZODone (DESYREL) tablet 50 mg  50 mg Oral QHS,MR X 1 Spencer E Simon, PA-C   50 mg at 06/07/14 0113    Lab Results: No results found for this or any previous visit (from the past 48 hour(s)).  Physical Findings: AIMS: Facial and Oral Movements Muscles of Facial Expression: None, normal Lips and Perioral Area: None, normal Jaw: None, normal Tongue: None, normal,Extremity Movements Upper (arms, wrists, hands, fingers): None, normal Lower (legs, knees, ankles, toes): None, normal, Trunk Movements Neck, shoulders, hips: None, normal, Overall Severity Severity of abnormal movements (highest score from questions above): None, normal Incapacitation due to abnormal movements: None, normal Patient's awareness of abnormal movements (rate only patient's report): No Awareness, Dental Status Current problems with teeth and/or dentures?: No Does patient usually wear dentures?: No  CIWA:  CIWA-Ar Total: 0 COWS:     Treatment Plan Summary: Daily contact with patient to assess and evaluate symptoms and progress in treatment and Medication management: Supportive approach/coping skills Alcohol Dependence:Completely weaned off the Librium, while maintaining the valium for high anxiety levels.  Anxiety/panic; continue the Lexapro/Valium/Neurontin combination. Continue Neurontin 200 mg TID and reassess. Conflict with GF; will continue to work on improving communications skills and with CBT/mindfulness to address  the anxiety when he talks to her.  Medical Decision Making:  Review of Psycho-Social Stressors (1), Review of Medication Regimen & Side Effects (2) and Review of New Medication or Change in Dosage (2)  Sanjuana KavaNwoko, Agnes I, PMHNP, FNP-BC 06/07/2014, 1:43 PM I agreed with findings and treatment plan of this patient

## 2014-06-07 NOTE — Progress Notes (Signed)
Patient did attend the evening speaker AA meeting.  

## 2014-06-07 NOTE — Progress Notes (Signed)
D   Pt is pleasant on approach but does endorse anxiety   He has appropriate interaction on the milieu with staff and other pts   He is active in groups and does not complain of any withdrawal symptoms  A   Verbal support given   Medications administered and effectiveness monitored   Q 15 min checks  R   Pt safe at present

## 2014-06-07 NOTE — [facility] Group Notes (Signed)
[facility] Group Notes:  (Clinical Social Work)  06/07/2014   1:15-2:15PM  Summary of Progress/Problems:  The main focus of today's process group was to   identify the patient's current support system and decide on other supports that can be put in place.  The picture on workbook was used to discuss why additional supports are needed.  An emphasis was placed on using counselor, doctor, therapy groups, 12-step groups, and problem-specific support groups to expand supports.   There was also an extensive discussion about what constitutes a healthy support versus an unhealthy support.  The patient expressed full comprehension of the concepts presented.  One current healthy support is peers at the hospital, staff, his brother, father, and father's girlfriend.  Also talked about God as a positive support, as well as his girlfriend with whom he has reconciled in the last 2 days.  He stated most of his friends are unhealthy supports because he uses with them, his ex-wife is toxic for him, and he himself when he does not communicate his feelings is unhealthy.  He shared at length.  Type of Therapy:  Process Group  Participation Level:  Active  Participation Quality:  Attentive and Sharing  Affect:  Blunted  Cognitive:  Appropriate and Oriented  Insight:  Engaged  Engagement in Therapy:  Engaged  Modes of Intervention:  Education,  Support and [employer] FoodsProcessing  Shaelee Forni Grossman-Orr, LCSW 06/07/2014, 4:00pm

## 2014-06-07 NOTE — Progress Notes (Signed)
Patient ID: Darren [name], male   DOB: [birth year], 44 y.o.   MRN: [id number]   D: Pt has been very flat and depressed this morning, he stayed in the bed most of the morning. He did not attend any groups nor did he engage in any treatment. Pt did get up at lunch he reported that last night he took much trazodone, which is why he could not wake up this morning. Once patient was up and about on the unit he was pleasant and interacted well with staff and peers. Pt took all medication once he was up as prescribed by the doctor. Pt reported being negative SI/HI, no AH/VH noted. A: 15 min checks continued for patient safety. R: Pt safety maintained.

## 2014-06-07 NOTE — [facility] Group Notes (Signed)
[facility] Group Notes:  (Nursing/MHT/Case Management/Adjunct)  Date:  06/07/2014  Time:  10:09 AM  Type of Therapy:  Psychoeducational Skills  Participation Level:  Did Not Attend  Participation Quality:  Did Not Attend  Affect:  Did Not Attend  Cognitive:  Did Not Attend  Insight:  None  Engagement in Group:  Did Not Attend  Modes of Intervention:  Did Not Attend  Summary of Progress/Problems: Pt did not attend patient self inventory group.     Jacquelyne BalintForrest, Royalty Fakhouri Shanta 06/07/2014, 10:09 AM

## 2014-06-08 ENCOUNTER — Encounter (HOSPITAL_COMMUNITY): Payer: BLUE CROSS/BLUE SHIELD

## 2014-06-08 DIAGNOSIS — F10230 Alcohol dependence with withdrawal, uncomplicated: Secondary | ICD-10-CM

## 2014-06-08 MED ORDER — METOPROLOL TARTRATE 25 MG PO TABS: 12.5000 mg | ORAL_TABLET | Freq: Two times a day (BID) | ORAL | Status: DC

## 2014-06-08 MED ORDER — PANTOPRAZOLE SODIUM 40 MG PO TBEC: 40.0000 mg | DELAYED_RELEASE_TABLET | Freq: Every day | ORAL | Status: DC

## 2014-06-08 MED ORDER — ASPIRIN 81 MG PO TBEC: 81.0000 mg | DELAYED_RELEASE_TABLET | Freq: Every day | ORAL | Status: DC

## 2014-06-08 MED ORDER — HYDROCHLOROTHIAZIDE 12.5 MG PO CAPS: 12.5000 mg | ORAL_CAPSULE | Freq: Every day | ORAL | Status: DC

## 2014-06-08 MED ORDER — TRAZODONE HCL 50 MG PO TABS
50.0000 mg | ORAL_TABLET | Freq: Every evening | ORAL | Status: DC | PRN
  Filled 2014-06-08: qty 1

## 2014-06-08 MED ORDER — GABAPENTIN 100 MG PO CAPS: 100.0000 mg | ORAL_CAPSULE | Freq: Three times a day (TID) | ORAL | Status: DC

## 2014-06-08 MED ORDER — ATORVASTATIN CALCIUM 80 MG PO TABS: 80.0000 mg | ORAL_TABLET | Freq: Every day | ORAL | Status: DC

## 2014-06-08 MED ORDER — TRAZODONE HCL 50 MG PO TABS: 50.0000 mg | ORAL_TABLET | Freq: Every evening | ORAL | Status: DC | PRN

## 2014-06-08 MED ORDER — CLOPIDOGREL BISULFATE 75 MG PO TABS: 75.0000 mg | ORAL_TABLET | Freq: Every day | ORAL | Status: DC

## 2014-06-08 MED ORDER — ESCITALOPRAM OXALATE 20 MG PO TABS: 20.0000 mg | ORAL_TABLET | Freq: Every day | ORAL | Status: DC

## 2014-06-08 MED ORDER — DIAZEPAM 5 MG PO TABS: 5.0000 mg | ORAL_TABLET | Freq: Every day | ORAL | Status: DC | PRN

## 2014-06-08 NOTE — Progress Notes (Addendum)
D: Pt denies SI/HI, denies hallucinations. Pt has been visible in milieu interacting with peers and staff appropriately.   A: Introduced self to pt. Supported and encouraged pt. Medications administered per order. PRN medication administered for mild anxiety.  R: Pt is compliant with medications. Pt verbally contracts for safety. Will continue to monitor and assess.

## 2014-06-08 NOTE — Clinical Social Work Note (Signed)
At patient's request, CSW left voicemail for patient's HR department to inquire about FMLA paperwork. Awaiting return call.  Samuella BruinKristin Tiffanye Hartmann, MSW, [employer] IncLCSWA Clinical Social Worker [facility] [phone]

## 2014-06-08 NOTE — [facility] Suicide Risk Assessment (Signed)
[facility]   Demographic Factors:  Male  Total Time spent with patient: 30 minutes  Musculoskeletal: Strength & Muscle Tone: within normal limits Gait & Station: normal Patient leans: N/A  Psychiatric Specialty Exam: Physical Exam  Review of Systems  Constitutional: Negative.   HENT: Negative.   Eyes: Negative.   Respiratory: Negative.   Cardiovascular: Negative.   Gastrointestinal: Negative.   Genitourinary: Negative.   Musculoskeletal: Negative.   Skin: Negative.   Neurological: Negative.   Endo/Heme/Allergies: Negative.   Psychiatric/Behavioral: Positive for substance abuse. The patient is nervous/anxious.     Blood pressure 133/82, pulse 73, temperature 97.5 F (36.4 C), temperature source Oral, resp. rate 16, height 5\' 7"  (1.702 m), weight 68.04 kg (150 lb).Body mass index is 23.49 kg/(m^2).  General Appearance: Fairly Groomed  Patent attorneyye Contact::  Fair  Speech:  Clear and Coherent409  Volume:  Normal  Mood:  Euthymic  Affect:  Appropriate  Thought Process:  Coherent and Goal Directed  Orientation:  Full (Time, Place, and Person)  Thought Content:  plans as he moves on, relapse prevention plan  Suicidal Thoughts:  No  Homicidal Thoughts:  No  Memory:  Immediate;   Fair Recent;   Fair Remote;   Fair  Judgement:  Fair  Insight:  Present  Psychomotor Activity:  Normal  Concentration:  Fair  Recall:  [employer]Fair  Fund of Knowledge:Fair  Language: Fair  Akathisia:  No  Handed:  Right  AIMS (if indicated):     Assets:  Desire for Improvement Housing Social Support Vocational/Educational  Sleep:  Number of Hours: 5  Cognition: WNL  ADL's:  Intact   Have you used any form of tobacco in the last 30 days? (Cigarettes, Smokeless Tobacco, Cigars, and/or Pipes): Yes  Has this patient used any form of tobacco in the last 30 days? (Cigarettes, Smokeless Tobacco, Cigars, and/or Pipes) Yes, A prescription for an FDA-approved tobacco cessation  medication was offered at discharge and the patient refused  Mental Status Per Nursing Assessment::   On Admission:  NA  Current Mental Status by Physician: In full contact with reality. There are no active S/S of withdrawal. There are no active SI plans or intent. He states he was able to communicate with his GF and clarify all the misinformation as well as open up and share what he did that could come back to affect the relationship and states they are going to get back together. He feels " so much better." He plans to continue to work on his Recovery as well as get involved in psychotherapy.    Loss Factors: NA  Historical Factors: NA  Risk Reduction Factors:   Responsible for children under 45 years of age, Sense of responsibility to family, Employed and Positive social support  Continued Clinical Symptoms:  Depression:   Comorbid alcohol abuse/dependence Alcohol/Substance Abuse/Dependencies  Cognitive Features That Contribute To Risk:  Closed-mindedness, Polarized thinking and Thought constriction (tunnel vision)    Suicide Risk:  Minimal: No identifiable suicidal ideation.  Patients presenting with no risk factors but with morbid ruminations; may be classified as minimal risk based on the severity of the depressive symptoms  Principal Problem: Alcohol dependence Discharge Diagnoses:  Patient Active Problem List   Diagnosis Date Noted  . Alcohol dependence with uncomplicated withdrawal [F10.230]   . 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 Alcohol dependence [F10.20] 06/02/2014  . Panic attacks [F41.0] 06/02/2014  . Coronary artery disease due to lipid rich plaque [I25.10]   .  Chest pain [R07.9] 03/25/2014  . Accelerated hypertension [I10] 03/04/2014  . Hypertensive heart disease [I11.9] 03/04/2014  . Tobacco abuse [Z72.0] 03/04/2014  . NSTEMI (non-ST elevated myocardial infarction) [I21.4] 03/02/2014    Follow-up Information    Follow up with [facility] On 06/09/2014.   Why:  Therapy appointment Tuesday April 12th at 6 pm with Dr. Lady Gary. Please bring insurance card to appointment and call office if you need to reschedule.   Contact information:   [address]  [place], [place] [address] Phone: [phone]       Fax: [phone]      Follow up with [facility] On 06/09/2014.   Why:  Patient agreeable to contact office on Tuesday April 12th to schedule follow up medication management appointment with Milus Height, PA.    Contact information:   [address], [address] [place] [address] Phone: [phone] Fax: [phone]       Plan Of Care/Follow-up recommendations:  Activity:  as tolerated Diet:  regular Follow up [facility] as above Is patient on multiple antipsychotic therapies at discharge:  No   Has Patient had three or more failed trials of antipsychotic monotherapy by history:  No  Recommended Plan for Multiple Antipsychotic Therapies: NA    Jalaiyah Throgmorton A 06/08/2014, 2:11 PM

## 2014-06-08 NOTE — Progress Notes (Signed)
Pt denies Si and HI. He was given back al of his belongings.Pt stated,'I am so excited to see my GF and my baby, Promise."[facility] to get his car. He stated he plans to follow up with all his appointments .

## 2014-06-08 NOTE — [facility] Group Notes (Signed)
   [facility] Note  06/08/2014  8:45 AM   Participation Quality: Alert, Appropriate and Oriented  Mood/Affect: Appropriate  Depression Rating: 0  Anxiety Rating: 1  Thoughts of Suicide: Pt denies SI/HI  Will you contract for safety? Yes  Current AVH: Pt denies  Plan for Discharge/Comments: Pt attended discharge planning group and actively participated in group. CSW provided pt with today's workbook. Patient reports feeling "better, and ready to discharge" today. Patient will return home with his father to follow up with his PCP [facility]el [facility] and [facility].  Transportation Means: Pt reports access to transportation  Supports: Patient identifies his family as supportive.  Samuella BruinKristin Izella Ybanez, MSW, [employer]LCSWA Clinical Social Worker [facility] [phone]

## 2014-06-08 NOTE — Progress Notes (Signed)
  [facility] :  Will you be returning to the same living situation after discharge:  Yes,  patient plans to return home with his father At discharge, do you have transportation home?: Yes,  patient reports access to transportation Do you have the ability to pay for your medications: Yes,  patient will be provided with prescriptions at discharge.  Release of information consent forms completed and in the chart;  Patient's signature needed at discharge.  Patient to Follow up at: Follow-up Information    Follow up with [facility] On 06/09/2014.   Why:  Therapy appointment Tuesday April 12th at 6 pm with Dr. Lady Garyannon. Please bring insurance card to appointment and call office if you need to reschedule.   Contact information:   [address]  [place], [place] [address] Phone: [phone]       Fax: [phone]      [facility].   Why:  Patient agreeable to contact office at discharge to schedule follow up medication management appointment with Milus HeightNoelle Redmon, PA.    Contact information:   [address] [place] [address] Phone: [phone] Fax: [phone]       Patient denies SI/HI: Yes,  denies    Aeronautical engineerafety Planning and Suicide Prevention discussed: Yes,  with patient and father  Have you used any form of tobacco in the last 30 days? (Cigarettes, Smokeless Tobacco, Cigars, and/or Pipes): Yes  Has patient been referred to the Quitline?: Patient refused referral  Johnathan Tortorelli, [place] CarboKristin L 06/08/2014, 10:21 AM

## 2014-06-08 NOTE — Discharge Summary (Signed)
Physician Discharge Summary Note  Patient:  Darren AgeeCharles A [name] is an 44 y.o., male MRN:  [id number] DOB:  1970/07/02 Patient phone:  [phone] (home)  Patient address[place]   [address] [place] [id number],  Total Time spent with patient: 45 minutes  Date of Admission:  06/02/2014 Date of Discharge: 06/08/2014  Reason for Admission:   Darren MostCharles is a 44 year old African-American male. Ad[facility] from the [facility] with complaints of suicidal ideations, nausea and chest pains. And during this admission assessment, he reports, "I went to the [facility] ED yesterday. I'm going through a relationship break-up. I have high anxiety problem. I feel very overwhelmed. A lot has been going on in my life in the last 2 months. I need something to get me out of this stress. I need help getting myself together to be & deal with my family. There is a lot of confusion going on in my life because I recently spent a night at my ex-wife's house because I wanted to spend some time with my children with my ex-wife. But, my ex-wife called my girl-friend and told her a fib, now my girl-friend would not let me in the house any more. I love her so much. I have been drinking quite a bit to cope, about 3 (24 oz) cans of locos daily. I have problems with anxiety anyway, now it is getting worse. My health provider is [name] with [facility]. She prescribed me Valium for my bad anxiety. Also have bad heart. I had a stent put in January of this year".  Principal Problem: Alcohol dependence Discharge Diagnoses: Patient Active Problem List   Diagnosis Date Noted  . Alcohol dependence with uncomplicated withdrawal [F10.230]   . 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 Alcohol dependence [F10.20] 06/02/2014  . Panic attacks [F41.0] 06/02/2014  . Coronary artery disease due to lipid rich plaque [I25.10]   . Chest pain [R07.9] 03/25/2014  . Accelerated hypertension [I10] 03/04/2014  .  Hypertensive heart disease [I11.9] 03/04/2014  . Tobacco abuse [Z72.0] 03/04/2014  . NSTEMI (non-ST elevated myocardial infarction) [I21.4] 03/02/2014    Musculoskeletal: Strength & Muscle Tone: within normal limits Gait & Station: normal Patient leans: N/A  Psychiatric Specialty Exam: Physical Exam  ROS  Blood pressure 133/82, pulse 73, temperature 97.5 F (36.4 C), temperature source Oral, resp. rate 16, height 5\' 7"  (1.702 m), weight 68.04 kg (150 lb).Body mass index is 23.49 kg/(m^2).    SEE MD PSE within the suicide risk assessment  Past Medical History:  Past Medical History  Diagnosis Date  . Hypertension   . Tobacco abuse   . Coronary artery disease 03/24/2014    SINGLE VESSEL CAD  . Anxiety     Past Surgical History  Procedure Laterality Date  . Left heart catheterization with coronary angiogram Bilateral 03/04/2014    Procedure: LEFT HEART CATHETERIZATION WITH CORONARY ANGIOGRAM;  Surgeon: Kathleene Hazelhristopher D McAlhany, MD;  [facility];  Service: Cardiovascular;  Laterality: Bilateral;  . Cardiac catheterization  03/04/2014  . Left heart catheterization with coronary angiogram N/A 03/26/2014    Procedure: LEFT HEART CATHETERIZATION WITH CORONARY ANGIOGRAM;  Surgeon: Lennette Biharihomas A Kelly, MD;  [facility];  Service: Cardiovascular;  Laterality: N/A;   Family History:  Family History  Problem Relation Age of Onset  . Hypertension Mother   . Hypertension Father   . Hypertension Other   . Diabetes Other   . Heart attack Other  Social History:  History  Alcohol Use  . 0.0 oz/week  . 0 Standard drinks or equivalent per week    Comment: occ (rare)     History  Drug Use  . Yes    Comment: Occasional marijuana    History   Social History  . Marital Status: Legally Separated    Spouse Name: N/A  . Number of Children: N/A  . Years of Education: N/A   Social History Main Topics  . Smoking status: Current Every Day Smoker -- 0.50 packs/day for 20  years  . Smokeless tobacco: Never Used  . Alcohol Use: 0.0 oz/week    0 Standard drinks or equivalent per week     Comment: occ (rare)  . Drug Use: Yes     Comment: Occasional marijuana  . Sexual Activity: Not Currently   Other Topics Concern  . None   Social History Narrative    Past Psychiatric History: Hospitalizations:  Outpatient Care:  Substance Abuse Care:  Self-Mutilation:  Suicidal Attempts:  Violent Behaviors:   Risk to Self: Is patient at risk for suicide?: Yes What has been your use of drugs/alcohol within the last 12 months?: Daily ETOH use prior to admissions- 2 to 4 24oz beers Risk to Others:   Prior Inpatient Therapy:   Prior Outpatient Therapy:    Level of Care:  OP  Hospital Course:   Darren [name] was admitted for Alcohol dependence , with psychosis and crisis management.  Pt was treated discharged with the medications listed below under Medication List.  Medical problems were identified and treated as needed.  Home medications were restarted as appropriate.  Improvement was monitored by observation and Darren [name] 's daily report of symptom reduction.  Emotional and mental status was monitored by daily self-inventory reports completed by Darren [name] and clinical staff.         Darren [name] was evaluated by the treatment team for stability and plans for continued recovery upon discharge. Darren [name] 's motivation was an integral factor for scheduling further treatment. Employment, transportation, bed availability, health status, family support, and any pending legal issues were also considered during hospital stay. Pt was offered further treatment options upon discharge including but not limited to Residential, Intensive Outpatient, and Outpatient treatment.  Darren [name] will follow up with the services as listed below under Follow Up Information.     Upon completion of this admission the patient was both mentally and medically stable for  discharge denying suicidal/homicidal ideation, auditory/visual/tactile hallucinations, delusional thoughts and paranoia.    Consults:  None  Significant Diagnostic Studies:  labs: UDS + benzos, + THC  Discharge Vitals:   Blood pressure 133/82, pulse 73, temperature 97.5 F (36.4 C), temperature source Oral, resp. rate 16, height 5\' 7"  (1.702 m), weight 68.04 kg (150 lb). Body mass index is 23.49 kg/(m^2). Lab Results:   No results found for this or any previous visit (from the past 72 hour(s)).  Physical Findings: AIMS: Facial and Oral Movements Muscles of Facial Expression: None, normal Lips and Perioral Area: None, normal Jaw: None, normal Tongue: None, normal,Extremity Movements Upper (arms, wrists, hands, fingers): None, normal Lower (legs, knees, ankles, toes): None, normal, Trunk Movements Neck, shoulders, hips: None, normal, Overall Severity Severity of abnormal movements (highest score from questions above): None, normal Incapacitation due to abnormal movements: None, normal Patient's awareness of abnormal movements (rate only patient's report): No Awareness, Dental Status Current problems with teeth and/or dentures?: No  Does patient usually wear dentures?: No  CIWA:  CIWA-Ar Total: 0 COWS:      See Psychiatric Specialty Exam and Suicide Risk Assessment completed by Attending Physician prior to discharge.  Discharge destination:  Home  Is patient on multiple antipsychotic therapies at discharge:  No   Has Patient had three or more failed trials of antipsychotic monotherapy by history:  No    Recommended Plan for Multiple Antipsychotic Therapies: NA     Medication List    TAKE these medications      Indication   aspirin 81 MG EC tablet  Take 1 tablet (81 mg total) by mouth daily.   Indication:  heart health     atorvastatin 80 MG tablet  Commonly known as:  LIPITOR  Take 1 tablet (80 mg total) by mouth daily at 6 PM.   Indication:  Elevation of Both  Cholesterol and Triglycerides in Blood     clopidogrel 75 MG tablet  Commonly known as:  PLAVIX  Take 1 tablet (75 mg total) by mouth daily.   Indication:  prophylaxis (cardiac)     diazepam 5 MG tablet  Commonly known as:  VALIUM  Take 1 tablet (5 mg total) by mouth daily as needed for anxiety.   Indication:  anxiety     escitalopram 20 MG tablet  Commonly known as:  LEXAPRO  Take 1 tablet (20 mg total) by mouth daily.   Indication:  Depression     gabapentin 100 MG capsule  Commonly known as:  NEURONTIN  Take 1 capsule (100 mg total) by mouth 3 (three) times daily.   Indication:  mood stabilization     hydrochlorothiazide 12.5 MG capsule  Commonly known as:  MICROZIDE  Take 1 capsule (12.5 mg total) by mouth daily.   Indication:  High Blood Pressure     metoprolol tartrate 25 MG tablet  Commonly known as:  LOPRESSOR  Take 0.5 tablets (12.5 mg total) by mouth 2 (two) times daily.   Indication:  High Blood Pressure     nitroGLYCERIN 0.4 MG SL tablet  Commonly known as:  NITROSTAT  Place 1 tablet (0.4 mg total) under the tongue every 5 (five) minutes x 3 doses as needed for chest pain.      pantoprazole 40 MG tablet  Commonly known as:  PROTONIX  Take 1 tablet (40 mg total) by mouth daily.   Indication:  Gastroesophageal Reflux Disease     traZODone 50 MG tablet  Commonly known as:  DESYREL  Take 1 tablet (50 mg total) by mouth at bedtime as needed for sleep.   Indication:  Trouble Sleeping           Follow-up Information    Follow up with [facility] On 06/09/2014.   Why:  Therapy appointment Tuesday April 12th at 6 pm with Dr. Lady Gary. Please bring insurance card to appointment and call office if you need to reschedule.   Contact information:   [address]  [place], [place] [address] Phone: [phone]       Fax: [phone]      Follow up with [facility].   Why:  Patient agreeable to contact office at discharge to schedule follow up  medication management appointment with Milus Height, PA.    Contact information:   [address], [address] [place] [address] Phone: [phone] Fax: [phone]       Follow-up recommendations:  Activity:  As tolerated Diet:  heart healthy with low sodium.  Comments:   Take all medications as prescribed. Keep all follow-up appointments as scheduled.  Do not consume alcohol or use illegal drugs while on prescription medications. Report any adverse effects from your medications to your primary care provider promptly.  In the event of recurrent symptoms or worsening symptoms, call 911, a crisis hotline, or go to the nearest emergency department for evaluation.   Total Discharge Time: Greater than 30 minutes  Signed: Beau Fanny, FNP-BC 06/08/2014, 10:26 AM  I personally assessed the patient and formulated the plan Madie Reno A. Dub Mikes, M.D.

## 2014-06-10 ENCOUNTER — Encounter (HOSPITAL_COMMUNITY): Payer: BLUE CROSS/BLUE SHIELD

## 2014-06-11 NOTE — Progress Notes (Addendum)
Patient Discharge Instructions:  After Visit Summary (AVS):   Faxed to:  06/11/14 Discharge Summary Note:   Faxed to:  06/11/14 Psychiatric Admission Assessment Note:   Faxed to:  06/11/14 Suicide Risk Assessment - Discharge Assessment:   Faxed to:  06/11/14 Faxed/Sent to the Next Level Care provider:  06/11/14  Faxed to [facility] @ [phone] No documentation was faxed to [facility] for HBIPS.  Per the SW the information was already sent.  Jerelene ReddenSheena E Bryant, 06/11/2014, 1:13 PM

## 2014-06-12 ENCOUNTER — Encounter (HOSPITAL_COMMUNITY): Payer: BLUE CROSS/BLUE SHIELD

## 2014-06-15 ENCOUNTER — Encounter (HOSPITAL_COMMUNITY): Payer: Self-pay | Admitting: Emergency Medicine

## 2014-06-15 ENCOUNTER — Encounter (HOSPITAL_COMMUNITY): Payer: BLUE CROSS/BLUE SHIELD

## 2014-06-15 ENCOUNTER — Emergency Department (HOSPITAL_COMMUNITY)
Admission: EM | Admit: 2014-06-15 | Discharge: 2014-06-16 | Disposition: A | Discharge status: Home / Self Care | Payer: BLUE CROSS/BLUE SHIELD | Attending: Emergency Medicine | Admitting: Emergency Medicine

## 2014-06-15 DIAGNOSIS — Z7982 Long term (current) use of aspirin: Secondary | ICD-10-CM | POA: Diagnosis not present

## 2014-06-15 DIAGNOSIS — F329 Major depressive disorder, single episode, unspecified: Secondary | ICD-10-CM | POA: Diagnosis not present

## 2014-06-15 DIAGNOSIS — F1094 Alcohol use, unspecified with alcohol-induced mood disorder: Secondary | ICD-10-CM | POA: Diagnosis present

## 2014-06-15 DIAGNOSIS — Z79899 Other long term (current) drug therapy: Secondary | ICD-10-CM | POA: Diagnosis not present

## 2014-06-15 DIAGNOSIS — Z720 Tobacco use: Secondary | ICD-10-CM | POA: Insufficient documentation

## 2014-06-15 DIAGNOSIS — Z9889 Other specified postprocedural states: Secondary | ICD-10-CM | POA: Diagnosis not present

## 2014-06-15 DIAGNOSIS — F10120 Alcohol abuse with intoxication, uncomplicated: Secondary | ICD-10-CM | POA: Diagnosis not present

## 2014-06-15 DIAGNOSIS — F419 Anxiety disorder, unspecified: Secondary | ICD-10-CM | POA: Insufficient documentation

## 2014-06-15 DIAGNOSIS — F10920 Alcohol use, unspecified with intoxication, uncomplicated: Secondary | ICD-10-CM

## 2014-06-15 DIAGNOSIS — I2510 Atherosclerotic heart disease of native coronary artery without angina pectoris: Secondary | ICD-10-CM | POA: Diagnosis not present

## 2014-06-15 DIAGNOSIS — E876 Hypokalemia: Secondary | ICD-10-CM

## 2014-06-15 DIAGNOSIS — R45851 Suicidal ideations: Secondary | ICD-10-CM | POA: Diagnosis present

## 2014-06-15 DIAGNOSIS — Z7902 Long term (current) use of antithrombotics/antiplatelets: Secondary | ICD-10-CM | POA: Insufficient documentation

## 2014-06-15 DIAGNOSIS — I10 Essential (primary) hypertension: Secondary | ICD-10-CM | POA: Insufficient documentation

## 2014-06-15 DIAGNOSIS — F32A Depression, unspecified: Secondary | ICD-10-CM

## 2014-06-15 NOTE — ED Notes (Addendum)
Pt reports he was just release from [facility] and states he his still have SI. Pt reports he would shoot himself in the head if he had access to a gun. Pt reports ETOH more than six pack. Pt reports issues with "babymama."

## 2014-06-16 ENCOUNTER — Encounter (HOSPITAL_COMMUNITY): Payer: Self-pay | Admitting: *Deleted

## 2014-06-16 ENCOUNTER — Inpatient Hospital Stay (HOSPITAL_COMMUNITY)
Admission: AD | Admit: 2014-06-16 | Discharge: 2014-06-19 | DRG: 897 | Disposition: A | Discharge status: Home / Self Care | Payer: BLUE CROSS/BLUE SHIELD | Source: Intra-hospital | Attending: Psychiatry | Admitting: Psychiatry

## 2014-06-16 DIAGNOSIS — F329 Major depressive disorder, single episode, unspecified: Secondary | ICD-10-CM | POA: Diagnosis present

## 2014-06-16 DIAGNOSIS — F1094 Alcohol use, unspecified with alcohol-induced mood disorder: Secondary | ICD-10-CM | POA: Diagnosis not present

## 2014-06-16 DIAGNOSIS — G4700 Insomnia, unspecified: Secondary | ICD-10-CM | POA: Diagnosis present

## 2014-06-16 DIAGNOSIS — Z79899 Other long term (current) drug therapy: Secondary | ICD-10-CM

## 2014-06-16 DIAGNOSIS — R45851 Suicidal ideations: Secondary | ICD-10-CM | POA: Diagnosis present

## 2014-06-16 DIAGNOSIS — F10929 Alcohol use, unspecified with intoxication, unspecified: Secondary | ICD-10-CM | POA: Insufficient documentation

## 2014-06-16 DIAGNOSIS — F32A Depression, unspecified: Secondary | ICD-10-CM | POA: Diagnosis present

## 2014-06-16 DIAGNOSIS — I119 Hypertensive heart disease without heart failure: Secondary | ICD-10-CM | POA: Diagnosis present

## 2014-06-16 DIAGNOSIS — I25119 Atherosclerotic heart disease of native coronary artery with unspecified angina pectoris: Secondary | ICD-10-CM | POA: Diagnosis present

## 2014-06-16 DIAGNOSIS — F10229 Alcohol dependence with intoxication, unspecified: Secondary | ICD-10-CM | POA: Diagnosis present

## 2014-06-16 DIAGNOSIS — R0602 Shortness of breath: Secondary | ICD-10-CM

## 2014-06-16 DIAGNOSIS — Z8249 Family history of ischemic heart disease and other diseases of the circulatory system: Secondary | ICD-10-CM

## 2014-06-16 DIAGNOSIS — Z7902 Long term (current) use of antithrombotics/antiplatelets: Secondary | ICD-10-CM | POA: Diagnosis not present

## 2014-06-16 DIAGNOSIS — Z7982 Long term (current) use of aspirin: Secondary | ICD-10-CM | POA: Diagnosis not present

## 2014-06-16 DIAGNOSIS — R071 Chest pain on breathing: Secondary | ICD-10-CM

## 2014-06-16 DIAGNOSIS — F1020 Alcohol dependence, uncomplicated: Secondary | ICD-10-CM | POA: Diagnosis present

## 2014-06-16 DIAGNOSIS — F410 Panic disorder [episodic paroxysmal anxiety] without agoraphobia: Secondary | ICD-10-CM | POA: Diagnosis present

## 2014-06-16 DIAGNOSIS — Y906 Blood alcohol level of 120-199 mg/100 ml: Secondary | ICD-10-CM | POA: Diagnosis present

## 2014-06-16 DIAGNOSIS — Z955 Presence of coronary angioplasty implant and graft: Secondary | ICD-10-CM | POA: Diagnosis not present

## 2014-06-16 DIAGNOSIS — I252 Old myocardial infarction: Secondary | ICD-10-CM | POA: Diagnosis not present

## 2014-06-16 DIAGNOSIS — F1010 Alcohol abuse, uncomplicated: Secondary | ICD-10-CM | POA: Diagnosis present

## 2014-06-16 DIAGNOSIS — F10120 Alcohol abuse with intoxication, uncomplicated: Secondary | ICD-10-CM | POA: Diagnosis not present

## 2014-06-16 DIAGNOSIS — I10 Essential (primary) hypertension: Secondary | ICD-10-CM

## 2014-06-16 HISTORY — DX: Major depressive disorder, single episode, unspecified: F32.9

## 2014-06-16 HISTORY — DX: Depression, unspecified: F32.A

## 2014-06-16 LAB — BASIC METABOLIC PANEL
ANION GAP: 6 (ref 5–15)
BUN: 14 mg/dL (ref 6–23)
CALCIUM: 8.5 mg/dL (ref 8.4–10.5)
CO2: 27 mmol/L (ref 19–32)
Chloride: 102 mmol/L (ref 96–112)
Creatinine, Ser: 1.14 mg/dL (ref 0.50–1.35)
GFR, EST AFRICAN AMERICAN: 89 mL/min — AB (ref >90)
GFR, EST NON AFRICAN AMERICAN: 77 mL/min — AB (ref >90)
Glucose, Bld: 109 mg/dL — ABNORMAL HIGH (ref 70–99)
Potassium: 3.4 mmol/L — ABNORMAL LOW (ref 3.5–5.1)
SODIUM: 135 mmol/L (ref 135–145)

## 2014-06-16 LAB — COMPREHENSIVE METABOLIC PANEL
ALK PHOS: 73 U/L (ref 39–117)
ALT: 32 U/L (ref 0–53)
AST: 32 U/L (ref 0–37)
Albumin: 4.4 g/dL (ref 3.5–5.2)
Anion gap: 8 (ref 5–15)
BUN: 11 mg/dL (ref 6–23)
CALCIUM: 8.4 mg/dL (ref 8.4–10.5)
CO2: 23 mmol/L (ref 19–32)
Chloride: 98 mmol/L (ref 96–112)
Creatinine, Ser: 0.92 mg/dL (ref 0.50–1.35)
GFR calc Af Amer: >90 mL/min (ref >90)
GFR calc non Af Amer: >90 mL/min (ref >90)
GLUCOSE: 116 mg/dL — AB (ref 70–99)
POTASSIUM: 2.9 mmol/L — AB (ref 3.5–5.1)
SODIUM: 129 mmol/L — AB (ref 135–145)
Total Bilirubin: 1 mg/dL (ref 0.3–1.2)
Total Protein: 8 g/dL (ref 6.0–8.3)

## 2014-06-16 LAB — ACETAMINOPHEN LEVEL

## 2014-06-16 LAB — RAPID URINE DRUG SCREEN, HOSP PERFORMED
Amphetamines: NOT DETECTED
Barbiturates: NOT DETECTED
Benzodiazepines: POSITIVE — AB
Cocaine: NOT DETECTED
Opiates: NOT DETECTED
Tetrahydrocannabinol: NOT DETECTED

## 2014-06-16 LAB — SALICYLATE LEVEL: Salicylate Lvl: <4 mg/dL (ref 2.8–20.0)

## 2014-06-16 LAB — CBC
HCT: 42 % (ref 39.0–52.0)
HEMOGLOBIN: 14.3 g/dL (ref 13.0–17.0)
MCH: 29.2 pg (ref 26.0–34.0)
MCHC: 34 g/dL (ref 30.0–36.0)
MCV: 85.7 fL (ref 78.0–100.0)
Platelets: 263 10*3/uL (ref 150–400)
RBC: 4.9 MIL/uL (ref 4.22–5.81)
RDW: 15.3 % (ref 11.5–15.5)
WBC: 9.3 10*3/uL (ref 4.0–10.5)

## 2014-06-16 LAB — ETHANOL: ALCOHOL ETHYL (B): 151 mg/dL — AB (ref 0–9)

## 2014-06-16 MED ORDER — ONDANSETRON HCL 4 MG PO TABS: 4.0000 mg | ORAL_TABLET | Freq: Three times a day (TID) | ORAL | Status: DC | PRN

## 2014-06-16 MED ORDER — ALUM & MAG HYDROXIDE-SIMETH 200-200-20 MG/5ML PO SUSP: 30.0000 mL | ORAL | Status: DC | PRN

## 2014-06-16 MED ORDER — HYDROXYZINE HCL 25 MG PO TABS
25.0000 mg | ORAL_TABLET | Freq: Four times a day (QID) | ORAL | Status: DC | PRN
  Administered 2014-06-16 – 2014-06-18 (×5): 25 mg via ORAL
  Filled 2014-06-16 (×5): qty 1
  Filled 2014-06-16: qty 10

## 2014-06-16 MED ORDER — POTASSIUM CHLORIDE CRYS ER 20 MEQ PO TBCR
40.0000 meq | EXTENDED_RELEASE_TABLET | Freq: Once | ORAL | Status: AC
  Administered 2014-06-16: 40 meq via ORAL
  Filled 2014-06-16: qty 2

## 2014-06-16 MED ORDER — METOPROLOL TARTRATE 25 MG PO TABS
12.5000 mg | ORAL_TABLET | Freq: Two times a day (BID) | ORAL | Status: DC
  Administered 2014-06-16 – 2014-06-19 (×6): 12.5 mg via ORAL
  Filled 2014-06-16 (×2): qty 0.5
  Filled 2014-06-16: qty 1
  Filled 2014-06-16 (×6): qty 0.5
  Filled 2014-06-16 (×2): qty 1

## 2014-06-16 MED ORDER — IBUPROFEN 200 MG PO TABS: 600.0000 mg | ORAL_TABLET | Freq: Three times a day (TID) | ORAL | Status: DC | PRN

## 2014-06-16 MED ORDER — NICOTINE 14 MG/24HR TD PT24: 14.0000 mg | MEDICATED_PATCH | Freq: Once | TRANSDERMAL | Status: DC

## 2014-06-16 MED ORDER — CLOPIDOGREL BISULFATE 75 MG PO TABS
75.0000 mg | ORAL_TABLET | Freq: Every day | ORAL | Status: DC
  Administered 2014-06-16 – 2014-06-19 (×4): 75 mg via ORAL
  Filled 2014-06-16 (×7): qty 1

## 2014-06-16 MED ORDER — ACETAMINOPHEN 325 MG PO TABS: 650.0000 mg | ORAL_TABLET | ORAL | Status: DC | PRN

## 2014-06-16 MED ORDER — TRAZODONE HCL 50 MG PO TABS
50.0000 mg | ORAL_TABLET | Freq: Every evening | ORAL | Status: DC | PRN
  Administered 2014-06-16 – 2014-06-18 (×3): 50 mg via ORAL
  Filled 2014-06-16 (×2): qty 1
  Filled 2014-06-16: qty 3
  Filled 2014-06-16: qty 1

## 2014-06-16 MED ORDER — ASPIRIN EC 81 MG PO TBEC
81.0000 mg | DELAYED_RELEASE_TABLET | Freq: Every day | ORAL | Status: DC
Start: 2014-06-16 — End: 2014-06-19
  Administered 2014-06-16 – 2014-06-19 (×4): 81 mg via ORAL
  Filled 2014-06-16 (×7): qty 1

## 2014-06-16 MED ORDER — PANTOPRAZOLE SODIUM 40 MG PO TBEC
40.0000 mg | DELAYED_RELEASE_TABLET | Freq: Every day | ORAL | Status: DC
  Administered 2014-06-16 – 2014-06-19 (×4): 40 mg via ORAL
  Filled 2014-06-16 (×7): qty 1

## 2014-06-16 MED ORDER — NITROGLYCERIN 0.4 MG SL SUBL: 0.4000 mg | SUBLINGUAL_TABLET | SUBLINGUAL | Status: DC | PRN

## 2014-06-16 MED ORDER — ATORVASTATIN CALCIUM 80 MG PO TABS
80.0000 mg | ORAL_TABLET | Freq: Every day | ORAL | Status: DC
  Administered 2014-06-17 – 2014-06-18 (×2): 80 mg via ORAL
  Filled 2014-06-16 (×4): qty 1

## 2014-06-16 MED ORDER — IBUPROFEN 600 MG PO TABS: 600.0000 mg | ORAL_TABLET | Freq: Three times a day (TID) | ORAL | Status: DC | PRN

## 2014-06-16 MED ORDER — HYDROCHLOROTHIAZIDE 12.5 MG PO CAPS
12.5000 mg | ORAL_CAPSULE | Freq: Every day | ORAL | Status: DC
  Administered 2014-06-16 – 2014-06-19 (×4): 12.5 mg via ORAL
  Filled 2014-06-16 (×7): qty 1

## 2014-06-16 NOTE — Tx Team (Signed)
Initial Interdisciplinary Treatment Plan   PATIENT STRESSORS: Financial difficulties Marital or family conflict Medication change or noncompliance Substance abuse   PATIENT STRENGTHS: Average or above average intelligence Capable of independent living Communication skills General fund of knowledge Motivation for treatment/growth Physical Health Supportive family/friends   PROBLEM LIST: Problem List/Patient Goals Date to be addressed Date deferred Reason deferred Estimated date of resolution  :"suicidal thoughts" 06/16/2014   D/c        "depression" 06/16/2014   D/c        "anxiety" 06/16/2014   D/c        "substance abuse" 06/16/2014   D/c               DISCHARGE CRITERIA:  Ability to meet basic life and health needs Adequate post-discharge living arrangements Improved stabilization in mood, thinking, and/or behavior Medical problems require only outpatient monitoring Motivation to continue treatment in a less acute level of care Need for constant or close observation no longer present Reduction of life-threatening or endangering symptoms to within safe limits Safe-care adequate arrangements made Verbal commitment to aftercare and medication compliance Withdrawal symptoms are absent or subacute and managed without 24-hour nursing intervention  PRELIMINARY DISCHARGE PLAN: Attend aftercare/continuing care group Attend PHP/IOP Attend 12-step recovery group Outpatient therapy Participate in family therapy Return to previous living arrangement Return to previous work or school arrangements  PATIENT/FAMIILY INVOLVEMENT: This treatment plan has been presented to and reviewed with the patient, Darren [name].  The patient and family have been given the opportunity to ask questions and make suggestions.  Earline MayotteKnight, Sotirios Navarro Shephard 06/16/2014, 6:27 PM

## 2014-06-16 NOTE — ED Notes (Signed)
Bed: WA30 Expected date:  Expected time:  Means of arrival:  Comments: 

## 2014-06-16 NOTE — Consult Note (Signed)
[place] [facility] Consult   Reason for Consult:  Alcohol use disorder, severe-moderate, Alcohol induced mood disorder Referring Physician:  EDP Patient Identification: Darren [name] MRN:  [id number] Principal Diagnosis: Alcohol-induced mood disorder Diagnosis:   Patient Active Problem List   Diagnosis Date Noted  . 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 Alcohol dependence with uncomplicated withdrawal [J03.009]   . Alcohol dependence [F10.20] 06/02/2014  . Panic attacks [F41.0] 06/02/2014  . Coronary artery disease due to lipid rich plaque [I25.10]   . Chest pain [R07.9] 03/25/2014  . Accelerated hypertension [I10] 03/04/2014  . Hypertensive heart disease [I11.9] 03/04/2014  . Tobacco abuse [Z72.0] 03/04/2014  . NSTEMI (non-ST elevated myocardial infarction) [I21.4] 03/02/2014    Total Time spent with patient: 1 hour  Subjective:   Darren [name] is a 44 y.o. male patient admitted with Alcohol induced mood disoprder.  HPI: AA male, 44 years old was evaluated for suicidal ideation with no plans.  He was also intoxicated with Alcohol level of 151 on arrival.  Patient stated " I feel like killing myself, I do not want to live anymore"    He was discharged from our inpatient Psychiatric unit early part of the month for same compliant.   This time he had an argument with his girl friend and stated that he is not able to handle the stress in the relationship.  Patient stated that  His stress and anxiety makes him relapse on Alcohol each time he is sober.  Patient reports that he has no planned method to kill himself but stated he is not willing to live any more.  He denies HI/AVH.  Patient accepted for admission and we will be looking for placement for him.  HPI Elements:   Location:  Suicidal ideation, depressive disorder. Quality:  severe. Severity:  severe. Timing:  Acute. Duration:  Chronic alcoholism, depression. Context:  seeking  treatment for depression..  Past Medical History:  Past Medical History  Diagnosis Date  . Hypertension   . Tobacco abuse   . Coronary artery disease 03/24/2014    SINGLE VESSEL CAD  . Anxiety     Past Surgical History  Procedure Laterality Date  . Left heart catheterization with coronary angiogram Bilateral 03/04/2014    Procedure: LEFT HEART CATHETERIZATION WITH CORONARY ANGIOGRAM;  Surgeon: Burnell Blanks, MD;  Location: [facility] CATH LAB;  Service: Cardiovascular;  Laterality: Bilateral;  . Cardiac catheterization  03/04/2014  . Left heart catheterization with coronary angiogram N/A 03/26/2014    Procedure: LEFT HEART CATHETERIZATION WITH CORONARY ANGIOGRAM;  Surgeon: Troy Sine, MD;  Location: [facility] CATH LAB;  Service: Cardiovascular;  Laterality: N/A;   Family History:  Family History  Problem Relation Age of Onset  . Hypertension Mother   . Hypertension Father   . Hypertension Other   . Diabetes Other   . Heart attack Other    Social History:  History  Alcohol Use  . 0.0 oz/week  . 0 Standard drinks or equivalent per week    Comment: occ (rare)     History  Drug Use  . Yes    Comment: Occasional marijuana    History   Social History  . Marital Status: Legally Separated    Spouse Name: N/A  . Number of Children: N/A  . Years of Education: N/A   Social History Main Topics  . Smoking status: Current Every Day Smoker -- 0.50 packs/day for 20 years  . Smokeless tobacco:  Never Used  . Alcohol Use: 0.0 oz/week    0 Standard drinks or equivalent per week     Comment: occ (rare)  . Drug Use: Yes     Comment: Occasional marijuana  . Sexual Activity: Not Currently   Other Topics Concern  . None   Social History Narrative   Additional Social History:    Pain Medications: Pt denies abuse  Prescriptions: Pt denies abuse Over the Counter: Pt denies abuse.  History of alcohol / drug use?: Yes Name of Substance 1: Alcohol  1 - Age of First Use: 14 1 - Amount  (size/oz): 2-24oz 1 - Frequency: Daily  1 - Duration: Ongoing  1 - Last Use / Amount: 06-16-14 BAL-151 Name of Substance 2: THC  2 - Age of First Use: 18 2 - Amount (size/oz): "2 smokes" 2 - Frequency: "not often" 2 - Duration: ongoing  2 - Last Use / Amount: 06/01/14                 Allergies:   Allergies  Allergen Reactions  . Hydrocodone Hives    Labs:  Results for orders placed or performed during the hospital encounter of 06/15/14 (from the past 48 hour(s))  Acetaminophen level     Status: Abnormal   Collection Time: 06/15/14 11:48 PM  Result Value Ref Range   Acetaminophen (Tylenol), Serum <10.0 (L) 10 - 30 ug/mL    Comment:        THERAPEUTIC CONCENTRATIONS VARY SIGNIFICANTLY. A RANGE OF 10-30 ug/mL MAY BE AN EFFECTIVE CONCENTRATION FOR MANY PATIENTS. HOWEVER, SOME ARE BEST TREATED AT CONCENTRATIONS OUTSIDE THIS RANGE. ACETAMINOPHEN CONCENTRATIONS >150 ug/mL AT 4 HOURS AFTER INGESTION AND >50 ug/mL AT 12 HOURS AFTER INGESTION ARE OFTEN ASSOCIATED WITH TOXIC REACTIONS.   CBC     Status: None   Collection Time: 06/15/14 11:48 PM  Result Value Ref Range   WBC 9.3 4.0 - 10.5 K/uL   RBC 4.90 4.22 - 5.81 MIL/uL   Hemoglobin 14.3 13.0 - 17.0 g/dL   HCT 42.0 39.0 - 52.0 %   MCV 85.7 78.0 - 100.0 fL   MCH 29.2 26.0 - 34.0 pg   MCHC 34.0 30.0 - 36.0 g/dL   RDW 15.3 11.5 - 15.5 %   Platelets 263 150 - 400 K/uL  Comprehensive metabolic panel     Status: Abnormal   Collection Time: 06/15/14 11:48 PM  Result Value Ref Range   Sodium 129 (L) 135 - 145 mmol/L   Potassium 2.9 (L) 3.5 - 5.1 mmol/L   Chloride 98 96 - 112 mmol/L   CO2 23 19 - 32 mmol/L   Glucose, Bld 116 (H) 70 - 99 mg/dL   BUN 11 6 - 23 mg/dL   Creatinine, Ser 0.92 0.50 - 1.35 mg/dL   Calcium 8.4 8.4 - 10.5 mg/dL   Total Protein 8.0 6.0 - 8.3 g/dL   Albumin 4.4 3.5 - 5.2 g/dL   AST 32 0 - 37 U/L   ALT 32 0 - 53 U/L   Alkaline Phosphatase 73 39 - 117 U/L   Total Bilirubin 1.0 0.3 - 1.2 mg/dL    GFR calc non Af Amer >90 >90 mL/min   GFR calc Af Amer >90 >90 mL/min    Comment: (NOTE) The eGFR has been calculated using the CKD EPI equation. This calculation has not been validated in all clinical situations. eGFR's persistently <90 mL/min signify possible Chronic Kidney Disease.    Anion gap 8 5 -  15  Ethanol (ETOH)     Status: Abnormal   Collection Time: 06/15/14 11:48 PM  Result Value Ref Range   Alcohol, Ethyl (B) 151 (H) 0 - 9 mg/dL    Comment:        LOWEST DETECTABLE LIMIT FOR SERUM ALCOHOL IS 11 mg/dL FOR MEDICAL PURPOSES ONLY   Salicylate level     Status: None   Collection Time: 06/15/14 11:48 PM  Result Value Ref Range   Salicylate Lvl <6.3 2.8 - 20.0 mg/dL  Basic metabolic panel     Status: Abnormal   Collection Time: 06/16/14  4:58 AM  Result Value Ref Range   Sodium 135 135 - 145 mmol/L   Potassium 3.4 (L) 3.5 - 5.1 mmol/L   Chloride 102 96 - 112 mmol/L   CO2 27 19 - 32 mmol/L   Glucose, Bld 109 (H) 70 - 99 mg/dL   BUN 14 6 - 23 mg/dL   Creatinine, Ser 1.14 0.50 - 1.35 mg/dL   Calcium 8.5 8.4 - 10.5 mg/dL   GFR calc non Af Amer 77 (L) >90 mL/min   GFR calc Af Amer 89 (L) >90 mL/min    Comment: (NOTE) The eGFR has been calculated using the CKD EPI equation. This calculation has not been validated in all clinical situations. eGFR's persistently <90 mL/min signify possible Chronic Kidney Disease.    Anion gap 6 5 - 15  Urine Drug Screen     Status: Abnormal   Collection Time: 06/16/14  7:35 AM  Result Value Ref Range   Opiates NONE DETECTED NONE DETECTED   Cocaine NONE DETECTED NONE DETECTED   Benzodiazepines POSITIVE (A) NONE DETECTED   Amphetamines NONE DETECTED NONE DETECTED   Tetrahydrocannabinol NONE DETECTED NONE DETECTED   Barbiturates NONE DETECTED NONE DETECTED    Comment:        DRUG SCREEN FOR MEDICAL PURPOSES ONLY.  IF CONFIRMATION IS NEEDED FOR ANY PURPOSE, NOTIFY LAB WITHIN 5 DAYS.        LOWEST DETECTABLE LIMITS FOR URINE  DRUG SCREEN Drug Class       Cutoff (ng/mL) Amphetamine      1000 Barbiturate      200 Benzodiazepine   893 Tricyclics       734 Opiates          300 Cocaine          300 THC              50     Vitals: Blood pressure 145/84, pulse 66, temperature 98.1 F (36.7 C), temperature source Oral, resp. rate 16, SpO2 96 %.  Risk to Self: Suicidal Ideation: Yes-Currently Present Suicidal Intent: Yes-Currently Present Is patient at risk for suicide?: Yes Suicidal Plan?: Yes-Currently Present Specify Current Suicidal Plan: "Just wanted to blow my head off". Access to Means: No What has been your use of drugs/alcohol within the last 12 months?: Daily alcohol use reported.  How many times?: 0 Other Self Harm Risks: No other self harm risk reported at this time. Triggers for Past Attempts: None known Intentional Self Injurious Behavior: None Risk to Others: Homicidal Ideation: No Thoughts of Harm to Others: No Comment - Thoughts of Harm to Others: NA Current Homicidal Intent: No Current Homicidal Plan: No Access to Homicidal Means: No Identified Victim: NA History of harm to others?: No Assessment of Violence: On admission Violent Behavior Description: No violent behaviors observed. Pt is calm and cooperative at this time. Does patient have access to  weapons?: No Criminal Charges Pending?: No Does patient have a court date: No Prior Inpatient Therapy: Prior Inpatient Therapy: Yes Prior Therapy Dates: 2010, 2016 Prior Therapy Facilty/Provider(s): [facility] Reason for Treatment: ETOH and MH Prior Outpatient Therapy: Prior Outpatient Therapy: No Prior Therapy Dates: None Prior Therapy Facilty/Provider(s): None Reason for Treatment: None  Current Facility-Administered Medications  Medication Dose Route Frequency Provider Last Rate Last Dose  . acetaminophen (TYLENOL) tablet 650 mg  650 mg Oral Q4H PRN Orpah Greek, MD      . alum & mag hydroxide-simeth (MAALOX/MYLANTA) 200-200-20  MG/5ML suspension 30 mL  30 mL Oral PRN Orpah Greek, MD      . ibuprofen (ADVIL,MOTRIN) tablet 600 mg  600 mg Oral Q8H PRN Orpah Greek, MD      . nicotine (NICODERM CQ - dosed in mg/24 hours) patch 14 mg  14 mg Transdermal Once Orpah Greek, MD   14 mg at 06/16/14 0232  . ondansetron (ZOFRAN) tablet 4 mg  4 mg Oral Q8H PRN Orpah Greek, MD       Current Outpatient Prescriptions  Medication Sig Dispense Refill  . aspirin 81 MG EC tablet Take 1 tablet (81 mg total) by mouth daily. 30 tablet 12  . atorvastatin (LIPITOR) 80 MG tablet Take 1 tablet (80 mg total) by mouth daily at 6 PM. 30 tablet 5  . clopidogrel (PLAVIX) 75 MG tablet Take 1 tablet (75 mg total) by mouth daily. 30 tablet 5  . diazepam (VALIUM) 5 MG tablet Take 1 tablet (5 mg total) by mouth daily as needed for anxiety. 7 tablet 0  . escitalopram (LEXAPRO) 20 MG tablet Take 1 tablet (20 mg total) by mouth daily. 30 tablet 0  . gabapentin (NEURONTIN) 100 MG capsule Take 1 capsule (100 mg total) by mouth 3 (three) times daily. 90 capsule 0  . hydrochlorothiazide (MICROZIDE) 12.5 MG capsule Take 1 capsule (12.5 mg total) by mouth daily. 30 capsule 5  . metoprolol tartrate (LOPRESSOR) 25 MG tablet Take 0.5 tablets (12.5 mg total) by mouth 2 (two) times daily. 60 tablet 5  . nitroGLYCERIN (NITROSTAT) 0.4 MG SL tablet Place 1 tablet (0.4 mg total) under the tongue every 5 (five) minutes x 3 doses as needed for chest pain. 25 tablet 12  . pantoprazole (PROTONIX) 40 MG tablet Take 1 tablet (40 mg total) by mouth daily. 30 tablet 0  . traZODone (DESYREL) 50 MG tablet Take 1 tablet (50 mg total) by mouth at bedtime as needed for sleep. 14 tablet 0    Musculoskeletal: Strength & Muscle Tone: within normal limits Gait & Station: normal Patient leans: N/A  Psychiatric Specialty Exam:     Blood pressure 145/84, pulse 66, temperature 98.1 F (36.7 C), temperature source Oral, resp. rate 16, SpO2 96  %.There is no weight on file to calculate BMI.  General Appearance: Casual and Disheveled  Eye Contact::  Fair  Speech:  Clear and Coherent and Normal Rate  Volume:  Normal  Mood:  Anxious, Depressed and Irritable  Affect:  Congruent, Depressed and Flat  Thought Process:  Coherent, Goal Directed and Intact  Orientation:  Full (Time, Place, and Person)  Thought Content:  WDL  Suicidal Thoughts:  Yes.  without intent/plan  Homicidal Thoughts:  No  Memory:  Immediate;   Good Recent;   Good Remote;   Good  Judgement:  Impaired  Insight:  Fair  Psychomotor Activity:  Normal  Concentration:  Good  Recall:  NA  Fund of Knowledge:Fair  Language: Good  Akathisia:  NA  Handed:  Right  AIMS (if indicated):     Assets:  Desire for Improvement  ADL's:  Impaired  Cognition: WNL  Sleep:      Medical Decision Making: Review of Psycho-Social Stressors (1), Established Problem, Worsening (2), Review of Medication Regimen & Side Effects (2) and Review of New Medication or Change in Dosage (2)  Treatment Plan Summary: Daily contact with patient to assess and evaluate symptoms and progress in treatment, Medication management and Plan admit and seek placement  Plan:  Recommend psychiatric Inpatient admission when medically cleared. Disposition: see above  Delfin Gant    PMHNP-BC 06/16/2014 12:49 PM Patient seen face-to-face for psychiatric evaluation, chart reviewed and case discussed with the physician extender and developed treatment plan. Reviewed the information documented and agree with the treatment plan. Corena Pilgrim, MD

## 2014-06-16 NOTE — [facility] Group Notes (Signed)
Adult Psychoeducational Group Note  Date:  06/16/2014 Time:  10:42 PM  Group Topic/Focus:  Wrap-Up Group:   The focus of this group is to help patients review their daily goal of treatment and discuss progress on daily workbooks.  Participation Level:  Active  Participation Quality:  Appropriate  Affect:  Appropriate  Cognitive:  Appropriate  Insight: Good  Engagement in Group:  Engaged  Modes of Intervention:  Discussion  Additional Comments:  [name] is a new admit.  He said his day was a roller coaster.  He said he's had anxiety, been anxious, depressed and over thinking things.  Caroll RancherLindsay, Estelita Iten A 06/16/2014, 10:42 PM

## 2014-06-16 NOTE — BH Assessment (Addendum)
Tele Assessment Note   Darren [name] is an 44 y.o. male presenting to [facility] reporting feelings of depression and suicidal thoughts. Pt stated "I am having suicidal thoughts and I feel depressed". "I have been having thoughts for a few weeks now". "I just got out to [facility] on Monday and I think I need to go back". Pt is endorsing SI with a plan to blow his head off. Pt did not report any previous suicide attempts. PT reported that approximately 15 year his uncle committed suicide by shooting himself. Pt is endorsing multiple depressive symptoms and shared that he is also dealing with multiple stressors due to problems with two females. Pt denies HI and AVH at this time. Pt did not report any pending criminal charges or upcoming court dates. Pt reported that he smokes marijuana and drinks alcohol whenever he is feeling stressed. Pt denied any history of physical, sexual or emotional abuse at this time.   Axis I: Depressive Disorder NOS and Alcohol intoxication, with moderate or severe use disorder   Past Medical History:  Past Medical History  Diagnosis Date  . Hypertension   . Tobacco abuse   . Coronary artery disease 03/24/2014    SINGLE VESSEL CAD  . Anxiety     Past Surgical History  Procedure Laterality Date  . Left heart catheterization with coronary angiogram Bilateral 03/04/2014    Procedure: LEFT HEART CATHETERIZATION WITH CORONARY ANGIOGRAM;  Surgeon: Kathleene Hazel, MD;  Location: [facility] CATH LAB;  Service: Cardiovascular;  Laterality: Bilateral;  . Cardiac catheterization  03/04/2014  . Left heart catheterization with coronary angiogram N/A 03/26/2014    Procedure: LEFT HEART CATHETERIZATION WITH CORONARY ANGIOGRAM;  Surgeon: Lennette Bihari, MD;  Location[facility] CATH LAB;  Service: Cardiovascular;  Laterality: N/A;    Family History:  Family History  Problem Relation Age of Onset  . Hypertension Mother   . Hypertension Father   . Hypertension Other   . Diabetes Other   . Heart  attack Other     Social History:  reports that he has been smoking.  He has never used smokeless tobacco. He reports that he drinks alcohol. He reports that he uses illicit drugs.  Additional Social History:  Alcohol / Drug Use Pain Medications: Pt denies abuse  Prescriptions: Pt denies abuse Over the Counter: Pt denies abuse.  History of alcohol / drug use?: Yes Substance #1 Name of Substance 1: Alcohol  1 - Age of First Use: 14 1 - Amount (size/oz): 2-24oz 1 - Frequency: Daily  1 - Duration: Ongoing  1 - Last Use / Amount: 06-16-14 BAL-151 Substance #2 Name of Substance 2: THC  2 - Age of First Use: 18 2 - Amount (size/oz): "2 smokes" 2 - Frequency: "not often" 2 - Duration: ongoing  2 - Last Use / Amount: 06/01/14  CIWA: CIWA-Ar BP: 116/68 mmHg Pulse Rate: 70 COWS:    PATIENT STRENGTHS: (choose at least two) Average or above average intelligence Capable of independent living  Allergies:  Allergies  Allergen Reactions  . Hydrocodone Hives    Home Medications:  (Not in a hospital admission)  OB/GYN Status:  No LMP for male patient.  General Assessment Data Location of Assessment: [facility] ED Is this a Tele or Face-to-Face Assessment?: Face-to-Face Is this an Initial Assessment or a Re-assessment for this encounter?: Initial Assessment Living Arrangements: Parent Can pt return to current living arrangement?: Yes Admission Status: Voluntary Is patient capable of signing voluntary admission?: Yes Transfer from:  Home Referral Source: Self/Family/Friend     [facility] Living Arrangements: Parent Name of Psychiatrist: Dr. Lady Garyannon Name of Therapist: No provider reported at this time.   Education Status Is patient currently in school?: No Highest grade of school patient has completed: 11th grade  Risk to self with the past 6 months Suicidal Ideation: Yes-Currently Present Suicidal Intent: Yes-Currently Present Is patient at risk for suicide?:  Yes Suicidal Plan?: Yes-Currently Present Specify Current Suicidal Plan: "Just wanted to blow my head off". Access to Means: No What has been your use of drugs/alcohol within the last 12 months?: Daily alcohol use reported.  Previous Attempts/Gestures: No How many times?: 0 Other Self Harm Risks: No other self harm risk reported at this time. Triggers for Past Attempts: None known Intentional Self Injurious Behavior: None Family Suicide History: No Recent stressful life event(s): Conflict (Comment) ("stress between two different women".) Persecutory voices/beliefs?: No Depression: Yes Depression Symptoms: Despondent, Insomnia, Tearfulness, Fatigue, Feeling worthless/self pity, Feeling angry/irritable, Loss of interest in usual pleasures, Guilt, Isolating Substance abuse history and/or treatment for substance abuse?: Yes Suicide prevention information given to non-admitted patients: Not applicable  Risk to Others within the past 6 months Homicidal Ideation: No Thoughts of Harm to Others: No Comment - Thoughts of Harm to Others: NA Current Homicidal Intent: No Current Homicidal Plan: No Access to Homicidal Means: No Identified Victim: NA History of harm to others?: No Assessment of Violence: On admission Violent Behavior Description: No violent behaviors observed. Pt is calm and cooperative at this time. Does patient have access to weapons?: No Criminal Charges Pending?: No Does patient have a court date: No  Psychosis Hallucinations: None noted Delusions: None noted  Mental Status Report Appearance/Hygiene: In scrubs Eye Contact: Poor Motor Activity: Freedom of movement Speech: Logical/coherent, Soft Level of Consciousness: Alert Mood: Depressed Affect: Flat Anxiety Level: Minimal Thought Processes: Coherent, Relevant Judgement: Partial Orientation: Person, Place, Time, Situation Obsessive Compulsive Thoughts/Behaviors: None  Cognitive Functioning Concentration:  Decreased Memory: Recent Intact IQ: Average Insight: Fair Impulse Control: Fair Appetite: Poor Weight Loss: 0 Weight Gain: 0 Sleep: Increased Total Hours of Sleep: 8 Vegetative Symptoms: Staying in bed  ADLScreening [facility] Assessment Services) Patient's cognitive ability adequate to safely complete daily activities?: Yes Patient able to express need for assistance with ADLs?: Yes Independently performs ADLs?: Yes (appropriate for developmental age)  Prior Inpatient Therapy Prior Inpatient Therapy: Yes Prior Therapy Dates: 2010, 2016 [facility] Reason for Treatment: ETOH and MH  Prior Outpatient Therapy Prior Outpatient Therapy: No Prior Therapy Dates: None Prior Therapy Facilty/Provider(s): None Reason for Treatment: None  ADL Screening (condition at time of admission) Patient's cognitive ability adequate to safely complete daily activities?: Yes Is the patient deaf or have difficulty hearing?: No Does the patient have difficulty seeing, even when wearing glasses/contacts?: No Does the patient have difficulty concentrating, remembering, or making decisions?: No Patient able to express need for assistance with ADLs?: Yes Does the patient have difficulty dressing or bathing?: No Independently performs ADLs?: Yes (appropriate for developmental age)       Abuse/Neglect Assessment (Assessment to be complete while patient is alone) Physical Abuse: Denies Verbal Abuse: Denies Sexual Abuse: Denies Exploitation of patient/patient's resources: Denies Self-Neglect: Denies          Additional Information 1:1 In Past 12 Months?: No CIRT Risk: No Elopement Risk: No Does patient have medical clearance?: Yes     Disposition:  Disposition Initial Assessment Completed for this Encounter: Yes Disposition of  Patient: Inpatient treatment program, Referred to Type of inpatient treatment program: Adult Patient referred to:  (Consult with [facility]  about inpatient criteria.)  Harmani Neto S 06/16/2014 2:32 AM

## 2014-06-16 NOTE — BH Assessment (Addendum)
[facility] Assessment Progress Note  Per Thedore MinsMojeed Akintayo, MD, this pt requires psychiatric hospitalization at this time.  Thurman CoyerEric Kaplan, RN, [facility] has assigned pt to Rm 304-2.  Pt has signed Voluntary Admission and Consent for Treatment, as well as Consent to Release Information to Everardo PacificArlene Cannon, EdD, and signed [facility].  A notification call has been placed to Dr. Lady Garyannon.  Pt's nurse, Minerva Areolaric, has been notified, and agrees to send original paperwork along with pt via [facility], and to call report to [phone].  Doylene Canninghomas Bella Brummet, MA Triage Specialist 06/16/2014 @ 15:15

## 2014-06-16 NOTE — ED Provider Notes (Signed)
CSN: [id number]     Arrival date & time 06/15/14  2311 History   First MD Initiated Contact with Patient 06/16/14 0057     Chief Complaint  Patient presents with  . Suicidal     (Consider location/radiation/quality/duration/timing/severity/associated sxs/prior Treatment) HPI Comments: Patient presents to the emergency department with complaints of depression and wanting to harm himself. Patient does admit to taking alcohol tonight. He reports that he had a disagreement with his "baby mama" tonight. This has caused him to feel depressed and want to hurt himself. Patient reports he was just discharged from behavioral health for depression.   Past Medical History  Diagnosis Date  . Hypertension   . Tobacco abuse   . Coronary artery disease 03/24/2014    SINGLE VESSEL CAD  . Anxiety    Past Surgical History  Procedure Laterality Date  . Left heart catheterization with coronary angiogram Bilateral 03/04/2014    Procedure: LEFT HEART CATHETERIZATION WITH CORONARY ANGIOGRAM;  Surgeon: Kathleene Hazelhristopher D McAlhany, MD;  [facility];  Service: Cardiovascular;  Laterality: Bilateral;  . Cardiac catheterization  03/04/2014  . Left heart catheterization with coronary angiogram N/A 03/26/2014    Procedure: LEFT HEART CATHETERIZATION WITH CORONARY ANGIOGRAM;  Surgeon: Lennette Biharihomas A Kelly, MD;  [facility];  Service: Cardiovascular;  Laterality: N/A;   Family History  Problem Relation Age of Onset  . Hypertension Mother   . Hypertension Father   . Hypertension Other   . Diabetes Other   . Heart attack Other    History  Substance Use Topics  . Smoking status: Current Every Day Smoker -- 0.50 packs/day for 20 years  . Smokeless tobacco: Never Used  . Alcohol Use: 0.0 oz/week    0 Standard drinks or equivalent per week     Comment: occ (rare)    Review of Systems  Psychiatric/Behavioral: Positive for suicidal ideas.  All other systems reviewed and are negative.     Allergies   Hydrocodone  Home Medications   Prior to Admission medications   Medication Sig Start Date End Date Taking? Authorizing Provider  aspirin 81 MG EC tablet Take 1 tablet (81 mg total) by mouth daily. 06/08/14  Yes Beau FannyJohn C Withrow, FNP  atorvastatin (LIPITOR) 80 MG tablet Take 1 tablet (80 mg total) by mouth daily at 6 PM. 06/08/14  Yes Beau FannyJohn C Withrow, FNP  clopidogrel (PLAVIX) 75 MG tablet Take 1 tablet (75 mg total) by mouth daily. 06/08/14  Yes Beau FannyJohn C Withrow, FNP  diazepam (VALIUM) 5 MG tablet Take 1 tablet (5 mg total) by mouth daily as needed for anxiety. 06/08/14  Yes Beau FannyJohn C Withrow, FNP  escitalopram (LEXAPRO) 20 MG tablet Take 1 tablet (20 mg total) by mouth daily. 06/08/14  Yes Beau FannyJohn C Withrow, FNP  gabapentin (NEURONTIN) 100 MG capsule Take 1 capsule (100 mg total) by mouth 3 (three) times daily. 06/08/14  Yes Beau FannyJohn C Withrow, FNP  hydrochlorothiazide (MICROZIDE) 12.5 MG capsule Take 1 capsule (12.5 mg total) by mouth daily. 06/08/14  Yes Beau FannyJohn C Withrow, FNP  metoprolol tartrate (LOPRESSOR) 25 MG tablet Take 0.5 tablets (12.5 mg total) by mouth 2 (two) times daily. 06/08/14  Yes Beau FannyJohn C Withrow, FNP  nitroGLYCERIN (NITROSTAT) 0.4 MG SL tablet Place 1 tablet (0.4 mg total) under the tongue every 5 (five) minutes x 3 doses as needed for chest pain. 03/05/14  Yes Dwana MelenaBryan W Hager, PA-C  pantoprazole (PROTONIX) 40 MG tablet Take 1 tablet (40 mg total) by mouth daily.  06/08/14  Yes Beau Fanny, FNP  traZODone (DESYREL) 50 MG tablet Take 1 tablet (50 mg total) by mouth at bedtime as needed for sleep. 06/08/14  Yes John C Withrow, FNP   BP 116/68 mmHg  Pulse 70  Temp(Src) 97.9 F (36.6 C) (Oral)  Resp 15  SpO2 98% Physical Exam  Constitutional: He is oriented to person, place, and time. He appears well-developed and well-nourished. No distress.  HENT:  Head: Normocephalic and atraumatic.  Right Ear: Hearing normal.  Left Ear: Hearing normal.  Nose: Nose normal.  Mouth/Throat: Oropharynx is clear  and moist and mucous membranes are normal.  Eyes: Conjunctivae and EOM are normal. Pupils are equal, round, and reactive to light.  Neck: Normal range of motion. Neck supple.  Cardiovascular: Regular rhythm, S1 normal and S2 normal.  Exam reveals no gallop and no friction rub.   No murmur heard. Pulmonary/Chest: Effort normal and breath sounds normal. No respiratory distress. He exhibits no tenderness.  Abdominal: Soft. Normal appearance and bowel sounds are normal. There is no hepatosplenomegaly. There is no tenderness. There is no rebound, no guarding, no tenderness at McBurney's point and negative Murphy's sign. No hernia.  Musculoskeletal: Normal range of motion.  Neurological: He is alert and oriented to person, place, and time. He has normal strength. No cranial nerve deficit or sensory deficit. Coordination normal. GCS eye subscore is 4. GCS verbal subscore is 5. GCS motor subscore is 6.  Intoxicated and sleepy  Skin: Skin is warm, dry and intact. No rash noted. No cyanosis.  Psychiatric: He has a normal mood and affect. His speech is normal and behavior is normal. He expresses suicidal ideation.  Nursing note and vitals reviewed.   ED Course  Procedures (including critical care time) Labs Review Labs Reviewed  ACETAMINOPHEN LEVEL - Abnormal; Notable for the following:    Acetaminophen (Tylenol), Serum <10.0 (*)    All other components within normal limits  COMPREHENSIVE METABOLIC PANEL - Abnormal; Notable for the following:    Sodium 129 (*)    Potassium 2.9 (*)    Glucose, Bld 116 (*)    All other components within normal limits  ETHANOL - Abnormal; Notable for the following:    Alcohol, Ethyl (B) 151 (*)    All other components within normal limits  CBC  SALICYLATE LEVEL  URINE RAPID DRUG SCREEN (HOSP PERFORMED)    Imaging Review No results found.   EKG Interpretation None      MDM   Final diagnoses:  Alcohol intoxication, uncomplicated  Hypokalemia   Depression    Patient presents to the ER for evaluation of depression and suicidal ideation. He also admits to drinking alcohol tonight, but does not appear to have a history of alcoholism. He had a disagreement with his significant other which sparked his drinking tonight. Patient states that he wants to harm himself. He was just treated at behavioral health for depression.  Medical examination reveals mild hypokalemia. Patient will be given potassium, otherwise is medically clear for psychiatric evaluation.    Gilda Crease, MD 06/16/14 [phone]

## 2014-06-16 NOTE — BH Assessment (Signed)
Assessment completed. Consulted Donell SievertSpencer Simon, PA-C who reported that final disposition pending UDS.

## 2014-06-16 NOTE — ED Notes (Signed)
Patient is resting comfortably. 

## 2014-06-16 NOTE — Progress Notes (Signed)
[facility], [facility], age 89 yrs old.  Stated he argued with the mother of his youngest child and started popping valium.  SI during admission, contracts, [facility].  Stated he presently lives with his dad who has guns in the house, planned to shoot self in head.  HI yesterday to baby's mother, has total of 5 children, only one child with this woman.  Woman lives in her own home.  Once they lived together.  Denied A/V hallucinations.  Denied pain.  Stomach hurts sometimes because of the alcohol he drinks.  Started drinking alcohol at age 44 years, last drink was last night.  No criminal charges against him.  Stated he usually drinks one 6 pack beer daily or more.  THC first used at age 44 yrs old.  Cocaine approximately 6 months ago.  Mollie once 2-3 months ago.  Denied heroin.  Only used some drugs as experimentally.  Has dentures.  Denied hearing, visual problems.  Skin, dry feet, carpet burns on bilateral knees.  Tattoos on R upper shoulder, bilateral arms.  Works at [employer] 2 yrs.  12th grade but did not graduate.  Rated depression and anxiety 10, hopeless 9.  Denied abuse.  Patient history of CAD, HTN, anxiety, depression, cardiac cath. Alcohol abuse reviewed with patient, score 20+.  Information given to patient. Patient given food/drink upon arrival.  Oriented to 300 hall.  Patient has been cooperative and pleasant. [place] 12 has cigarette, shoe laces, key chain and keys, [place], cards, red wallet, black belt, back pack.

## 2014-06-16 NOTE — ED Notes (Signed)
Pt. Ate 100% of breakfast and 360 cc of fluid

## 2014-06-17 ENCOUNTER — Encounter (HOSPITAL_COMMUNITY): Payer: BLUE CROSS/BLUE SHIELD

## 2014-06-17 DIAGNOSIS — F1020 Alcohol dependence, uncomplicated: Secondary | ICD-10-CM

## 2014-06-17 MED ORDER — GABAPENTIN 100 MG PO CAPS
100.0000 mg | ORAL_CAPSULE | Freq: Three times a day (TID) | ORAL | Status: DC
  Administered 2014-06-17 – 2014-06-19 (×6): 100 mg via ORAL
  Filled 2014-06-17: qty 9
  Filled 2014-06-17 (×5): qty 1
  Filled 2014-06-17: qty 9
  Filled 2014-06-17 (×2): qty 1
  Filled 2014-06-17 (×2): qty 9
  Filled 2014-06-17 (×2): qty 1
  Filled 2014-06-17: qty 9

## 2014-06-17 NOTE — BHH Suicide Risk Assessment (Signed)
[facility]   Nursing information obtained from:  Patient Demographic factors:  Male, Low socioeconomic status Current Mental Status:  Suicidal ideation indicated by patient, Suicide plan Loss Factors:  Financial problems / change in socioeconomic status Historical Factors:  Prior suicide attempts, Family history of suicide, Family history of mental illness or substance abuse Risk Reduction Factors:  Responsible for children under 45 years of age, Employed, Living with another person, especially a relative Total Time spent with patient: 45 minutes Principal Problem: Alcohol use disorder, severe, dependence Diagnosis:   Patient Active Problem List   Diagnosis Date Noted  . Alcohol use disorder, severe, dependence [F10.20] 06/16/2014  . Alcohol intoxication [F10.129]   . Depression [F32.9]   . Alcohol dependence with uncomplicated withdrawal [F10.230]   . 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 Alcohol dependence [F10.20] 06/02/2014  . Panic attacks [F41.0] 06/02/2014  . Coronary artery disease due to lipid rich plaque [I25.10]   . Chest pain [R07.9] 03/25/2014  . Accelerated hypertension [I10] 03/04/2014  . Hypertensive heart disease [I11.9] 03/04/2014  . Tobacco abuse [Z72.0] 03/04/2014  . NSTEMI (non-ST elevated myocardial infarction) [I21.4] 03/02/2014     Continued Clinical Symptoms:  Alcohol Use Disorder Identification Test Final Score (AUDIT): 36 The "Alcohol Use Disorders Identification Test", Guidelines for Use in Primary Care, Second Edition.  World Science writerHealth Organization [facility](WHO). Score between 0-7:  no or low risk or alcohol related problems. Score between 8-15:  moderate risk of alcohol related problems. Score between 16-19:  high risk of alcohol related problems. Score 20 or above:  warrants further diagnostic evaluation for alcohol dependence and treatment.   CLINICAL FACTORS:   Severe Anxiety and/or Agitation Panic  Attacks Depression:   Comorbid alcohol abuse/dependence Impulsivity Alcohol/Substance Abuse/Dependencies   Musculoskeletal: Strength & Muscle Tone: within normal limits Gait & Station: normal Patient leans: N/A  Psychiatric Specialty Exam: Physical Exam  Review of Systems  Constitutional: Positive for malaise/fatigue.  HENT: Negative.   Eyes: Negative.   Respiratory: Negative.   Cardiovascular: Negative.   Gastrointestinal: Negative.   Genitourinary: Negative.   Musculoskeletal: Negative.   Skin: Negative.   Neurological: Negative.   Endo/Heme/Allergies: Negative.   Psychiatric/Behavioral: Positive for depression and substance abuse. The patient is nervous/anxious and has insomnia.     Blood pressure 134/79, pulse 52, temperature 98.4 F (36.9 C), temperature source Oral, resp. rate 16, height 5\' 6"  (1.676 m), weight 71.215 kg (157 lb), SpO2 100 %.Body mass index is 25.35 kg/(m^2).  General Appearance: Fairly Groomed  Patent attorneyye Contact::  Fair  Speech:  Clear and Coherent  Volume:  Normal  Mood:  Anxious, Depressed and worried  Affect:  Depressed and anxious worried  Thought Process:  Coherent and Goal Directed  Orientation:  Full (Time, Place, and Person)  Thought Content:  symptoms events worries concerns  Suicidal Thoughts:  No  Homicidal Thoughts:  No  Memory:  Immediate;   Fair Recent;   Fair Remote;   Fair  Judgement:  Fair  Insight:  Present  Psychomotor Activity:  Restlessness  Concentration:  Fair  Recall:  FiservFair  Fund of Knowledge:Fair  Language: Fair  Akathisia:  No  Handed:  Right  AIMS (if indicated):     Assets:  Desire for Improvement Housing Vocational/Educational  Sleep:  Number of Hours: 6.25  Cognition: WNL  ADL's:  Intact     COGNITIVE FEATURES THAT CONTRIBUTE TO RISK:  Closed-mindedness, Polarized thinking and Thought constriction (tunnel vision)    SUICIDE RISK:  Moderate:  Frequent suicidal ideation with limited intensity, and  duration, some specificity in terms of plans, no associated intent, good self-control, limited dysphoria/symptomatology, some risk factors present, and identifiable protective factors, including available and accessible social support. Davione states that when he left here couple of weeks ago he resumed the relationship with his GF. States that she kept coming back with events that happened in the past. States that his anxiety, his mood instability continued to mount up and he relapsed. Once he drank the first one he could not stop. States that while intoxicated he stated he was going to kill himself. The police were called and he was brought to the ED PLAN OF CARE:  Supportive approach/coping skills  Alcohol dependence; will identify need for detox/will work a relapse prevention plan Anxiety/agitation; he states that even the Valium stop working for him and he does not want to take it anymore. Concerned about taking psychotropics due to his heart condition. We discussed options; will try Neurontin as a medication that can offer some anxiety relief as well as help with mood stabilization Will explore need for a residential treatment program once D/C Medical Decision Making:  Review of Psycho-Social Stressors (1), Review or order clinical lab tests (1), Review of Medication Regimen & Side Effects (2) and Review of New Medication or Change in Dosage (2)  I certify that inpatient services furnished can reasonably be expected to improve the patient's condition.   Anayansi Rundquist A 06/17/2014, 2:49 PM

## 2014-06-17 NOTE — Progress Notes (Addendum)
D: Pt presents anxious in affect and mood. Pt is passively SI. Pt verbally contracts for safety. Pt observed interacting appropriately with others. Pt is visibly active within the milieu. Pt is negative for any HI/AVH at the present time.  A: Writer administered scheduled and prn medications to pt, per MD orders. Continued support and availability as needed was extended to this pt. Staff continue to monitor pt with q3015min checks.  R: No adverse drug reactions noted. Pt receptive to treatment. Pt remains safe at this time.

## 2014-06-17 NOTE — Progress Notes (Signed)
Patient ID: Darren [name], male   DOB: Oct 16, 1970, 44 y.o.   MRN: [id number]  Pt currently presents with a flat affect and anxious behavior. Per self inventory, pt rates depression, hopelessness and anxiety at a 9. Pt's daily goal is to "thinking about myself, praying" and they intend to do so by "block out all the negative things." Pt reports good sleep, poor concentration and a low energy.   Pt provided with medications per providers orders. Pt's labs and vitals were monitored throughout the day. Pt supported emotionally and encouraged to express concerns and questions. Pt educated on medications. Pt encouraged to think about alternative coping skills. Pt reports "I just blow, I get so moody" and that he "just get home, go to my room and don't talk to anyone." Pt given a journal.   Pt's safety ensured with 15 minute and environmental checks. Pt currently denies SI/HI and A/V hallucinations. Pt verbally agrees to seek staff if SI/HI or A/VH occurs and to consult with staff before acting on these thoughts. Pt reports that writing and taking a walk would be better ways of coping with his strong emotions. Pt reports stress over his relationship with his daughter's wife.

## 2014-06-17 NOTE — Plan of Care (Signed)
Problem: Ineffective individual coping Goal: STG: Pt will be able to identify effective and ineffective STG: Pt will be able to identify effective and ineffective coping patterns  Outcome: Progressing Pt able to identify two coping skills, walking and writing, instead of isolation for intense feelings surrounding ex girlfriend.

## 2014-06-17 NOTE — [facility] Group Notes (Signed)
[facility]   06/17/2014 9:48 AM  Participation Quality:  Minimal   Mood/Affect:  Depressed and Flat  Depression Rating:   9  Anxiety Rating: 9    Thoughts of Suicide:  No Will you contract for safety?   NA  Current AVH:  No  Plan for Discharge/Comments:  Pt reports that he is having trouble with anxiety this morning and reports poor sleep. Pt reports no withdrawal symptoms. "the medication they are giving me is not helping at all." pt appears groggy this morning. He plans to return to his father's home at d/c and will [facility], and [facility] for [facility]. CSW assessing.   Transportation Means: bus or father   Supports: father   Smart, Lebron QuamHeather LCSWA

## 2014-06-17 NOTE — Tx Team (Signed)
Interdisciplinary Treatment Plan Update (Adult)   Date: 06/17/2014   Time Reviewed: 8:30 AM  Progress in Treatment:  Attending groups: Yes  Participating in groups:  Yes  Taking medication as prescribed: Yes  Tolerating medication: Yes  Family/Significant othe contact made: Not yet. SPE required for this pt.   Patient understands diagnosis: Yes, AEB seeking treatment for SI with plan, depression, med stabilization, and for  benzo/THC abuse.  Discussing patient identified problems/goals with staff: Yes  Medical problems stabilized or resolved: Yes  Denies suicidal/homicidal ideation: Passive SI/able to contract for safety on unit.   Patient has not harmed self or Others: Yes  New problem(s) identified:  Discharge Plan or Barriers: [facility]. He plans to return home with his father at d/c. All other referrals refused by pt at this time.  Additional comments:  [name] is an 44 y.o. male [facility] feelings of depression and suicidal thoughts. "I have been having thoughts for a few weeks now". "[facility] and I think I need to go back". Pt is endorsing SI with a plan to blow his head off. Pt did not report any previous suicide attempts. Pt reported that approximately 15 year his uncle committed suicide by shooting himself. Pt is endorsing multiple depressive symptoms and shared that he is also dealing with multiple stressors due to problems with two females. Pt denies HI and AVH at this time. Pt did not report any pending criminal charges or upcoming court dates. Pt reported that he smokes marijuana and drinks alcohol whenever he is feeling stressed. Pt denied any history of physical, sexual or emotional abuse at this time. Pt positive <UDS for benzos and THC upon admission> Reason for Continuation of  Hospitalization: Medication management SI Depression/mood instability Estimated length of stay: 3-5 days  For review of initial/current patient goals, please see plan of care.  Attendees:  Patient:    Family:    Physician: Geoffery LyonsIrving Lugo MD 06/17/2014 8:29 AM   Nursing: Maureen RalphsVivian RN; Griffin Dakinonecia RN; Huntley DecSara RN 06/17/2014 8:29 AM   Clinical Social Worker Hansika Leaming Smart, LCSWA  06/17/2014 8:29 AM   Other: Gerilyn PilgrimQuylle H. LCSWEarley Abide; Kristin D. LCSWA 06/17/2014 8:29 AM   Other: Darden DatesJennifer C. Nurse CM 06/17/2014 8:29 AM   Other: Liliane Badeolora Sutton, Community Care Coordinator  06/17/2014 8:29 AM   Other:    Scribe for Treatment Team:  Herbert SetaHeather Smart LCSWA 06/17/2014 8:29 AM
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
## 2014-06-17 NOTE — H&P (Signed)
Psychiatric Admission Assessment Adult  Patient Identification: CHIP CANEPA MRN:  [id number] Date of Evaluation:  06/17/2014 Chief Complaint:  Alcohol Abuse MDD Principal Diagnosis: Alcohol use disorder, severe, dependence Diagnosis:   Patient Active Problem List   Diagnosis Date Noted  . Alcohol use disorder, severe, dependence [F10.20] 06/16/2014  . Alcohol intoxication [F10.129]   . Depression [F32.9]   . Alcohol dependence with uncomplicated withdrawal [M09.470]   . 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 Alcohol dependence [F10.20] 06/02/2014  . Panic attacks [F41.0] 06/02/2014  . Coronary artery disease due to lipid rich plaque [I25.10]   . Chest pain [R07.9] 03/25/2014  . Accelerated hypertension [I10] 03/04/2014  . Hypertensive heart disease [I11.9] 03/04/2014  . Tobacco abuse [Z72.0] 03/04/2014  . NSTEMI (non-ST elevated myocardial infarction) [I21.4] 03/02/2014   History of Present Illness: Darren EYER is a 44 y.o. male patient admitted with Alcohol induced mood disorder.  He was evaluated for suicidal ideation with no plans. He was also intoxicated with Alcohol level of 151 on arrival. Patient stated " I feel like killing myself, I do not want to live anymore" He was discharged from our inpatient Psychiatric unit early part of the month for same compliant. This time he had an argument with his girl friend and stated that he is not able to handle the stress in the relationship. Patient stated that His stress and anxiety makes him relapse on Alcohol each time he is sober. Patient reported that he has no planned method to kill himself but stated he is not willing to live any more. He denies HI/AVH.   He was seen today.  He states that he was recently discharged from [facility].  He stated that he experiences high anxiety and has Rx for Diazepam PRN.  Big crowds scare him and he avoids places i.e.. grocery stores.  He also pacifies the anxiety by drinking  as much as a 12 pack daily.  Longest period of sobriety is a year long.  He states that he just became a father then and he was always at work.  He has HTN and history of cardiac stents.  No DM.    HPI Elements: Location: Suicidal ideation, depressive disorder. Quality: severe. Severity: severe. Timing: Acute. Duration: Chronic alcoholism, depression. Context: seeking treatment for depression.  Associated Signs/Symptoms: Depression Symptoms:  depressed mood, hopelessness, impaired memory, suicidal attempt, anxiety, (Hypo) Manic Symptoms:  Irritable Mood, Labiality of Mood, Anxiety Symptoms:  Excessive Worry, Social Anxiety, Psychotic Symptoms:  NA PTSD Symptoms: NA Total Time spent with patient: 30 minutes  Past Medical History:  Past Medical History  Diagnosis Date  . Hypertension   . Tobacco abuse   . Coronary artery disease 03/24/2014    SINGLE VESSEL CAD  . Anxiety   . Depression     Past Surgical History  Procedure Laterality Date  . Left heart catheterization with coronary angiogram Bilateral 03/04/2014    Procedure: LEFT HEART CATHETERIZATION WITH CORONARY ANGIOGRAM;  Surgeon: Burnell Blanks, MD;  Location: [facility] CATH LAB;  Service: Cardiovascular;  Laterality: Bilateral;  . Left heart catheterization with coronary angiogram N/A 03/26/2014    Procedure: LEFT HEART CATHETERIZATION WITH CORONARY ANGIOGRAM;  Surgeon: Troy Sine, MD;  Location: [facility] CATH LAB;  Service: Cardiovascular;  Laterality: N/A;  . Cardiac catheterization  03/04/2014   Family History:  Family History  Problem Relation Age of Onset  . Hypertension Mother   . Hypertension Father   . Hypertension Other   . Diabetes  Other   . Heart attack Other    Social History:  History  Alcohol Use  . 0.0 oz/week  . 0 Standard drinks or equivalent per week    Comment: beer alot, at least a 12 pack daily     History  Drug Use  . Yes  . Special: Cocaine, Marijuana    Comment: THC  cocaine    mollie      History   Social History  . Marital Status: Legally Separated    Spouse Name: N/A  . Number of Children: N/A  . Years of Education: N/A   Social History Main Topics  . Smoking status: Current Every Day Smoker -- 1.00 packs/day for 20 years  . Smokeless tobacco: Never Used  . Alcohol Use: 0.0 oz/week    0 Standard drinks or equivalent per week     Comment: beer alot, at least a 12 pack daily  . Drug Use: Yes    Special: Cocaine, Marijuana     Comment: THC  cocaine   mollie    . Sexual Activity: Yes    Birth Control/ Protection: Condom   Other Topics Concern  . None   Social History Narrative   Additional Social History:    Pain Medications: denied Prescriptions: aspirin 81 mg   lipitor 80 mg   plavix 75 mg   valium 5 mg   lexapro 20 mg   neurotin 100 mg    microzide 12.5 mg   lopressor 25 mg   nitrostat   protnoix 40 mg     trazadone 50 mg Over the Counter: denied History of alcohol / drug use?: Yes Longest period of sobriety (when/how long): not sure Negative Consequences of Use: Financial, Personal relationships Withdrawal Symptoms:  (anxiety, depression, panic) Name of Substance 1: alcohol 1 - Age of First Use: 44 yrs old 1 - Amount (size/oz): one pack beer daily 1 - Frequency: daily 1 - Duration: ongoing 1 - Last Use / Amount: yesterday Name of Substance 2: THC 2 - Age of First Use: 44 years old 2 - Amount (size/oz): unsure 2 - Frequency: daily 2 - Duration: years 2 - Last Use / Amount: 3 months ago   Musculoskeletal: Strength & Muscle Tone: within normal limits Gait & Station: normal Patient leans: N/A  Psychiatric Specialty Exam: Physical Exam  Vitals reviewed.   Review of Systems  Psychiatric/Behavioral: Positive for depression. The patient has insomnia.     Blood pressure 134/79, pulse 52, temperature 98.4 F (36.9 C), temperature source Oral, resp. rate 16, height 5' 6"  (1.676 m), weight 71.215 kg (157 lb), SpO2 100 %.Body mass index  is 25.35 kg/(m^2).  General Appearance: Neat  Eye Contact::  Good  Speech:  Normal Rate  Volume:  Normal  Mood:  Anxious, Depressed and Hopeless  Affect:  Appropriate and Depressed  Thought Process:  Intact  Orientation:  Full (Time, Place, and Person)  Thought Content:  Rumination  Suicidal Thoughts:  No  Homicidal Thoughts:  No  Memory:  Immediate;   Fair Recent;   Fair Remote;   Fair  Judgement:  Good  Insight:  Good  Psychomotor Activity:  Normal  Concentration:  Good  Recall:  Good  Fund of Knowledge:Good  Language: Good  Akathisia:  Negative  Handed:  Right  AIMS (if indicated):     Assets:  Desire for Improvement Resilience Social Support Talents/Skills  ADL's:  Intact  Cognition: WNL  Sleep:  Number of Hours: 6.25  Risk to Self: Is patient at risk for suicide?: No Risk to Others:   Prior Inpatient Therapy:   Prior Outpatient Therapy:    Alcohol Screening: 1. How often do you have a drink containing alcohol?: 4 or more times a week 2. How many drinks containing alcohol do you have on a typical day when you are drinking?: 10 or more 3. How often do you have six or more drinks on one occasion?: Daily or almost daily Preliminary Score: 8 4. How often during the last year have you found that you were not able to stop drinking once you had started?: Daily or almost daily 5. How often during the last year have you failed to do what was normally expected from you becasue of drinking?: Daily or almost daily 6. How often during the last year have you needed a first drink in the morning to get yourself going after a heavy drinking session?: Daily or almost daily 7. How often during the last year have you had a feeling of guilt of remorse after drinking?: Daily or almost daily 8. How often during the last year have you been unable to remember what happened the night before because you had been drinking?: Daily or almost daily 9. Have you or someone else been injured as a  result of your drinking?: No 10. Has a relative or friend or a doctor or another health worker been concerned about your drinking or suggested you cut down?: Yes, during the last year Alcohol Use Disorder Identification Test Final Score (AUDIT): 36 Brief Intervention: MD notified of score 20 or above  Allergies:   Allergies  Allergen Reactions  . Hydrocodone Hives   Lab Results:  Results for orders placed or performed during the hospital encounter of 06/15/14 (from the past 48 hour(s))  Acetaminophen level     Status: Abnormal   Collection Time: 06/15/14 11:48 PM  Result Value Ref Range   Acetaminophen (Tylenol), Serum <10.0 (L) 10 - 30 ug/mL    Comment:        THERAPEUTIC CONCENTRATIONS VARY SIGNIFICANTLY. A RANGE OF 10-30 ug/mL MAY BE AN EFFECTIVE CONCENTRATION FOR MANY PATIENTS. HOWEVER, SOME ARE BEST TREATED AT CONCENTRATIONS OUTSIDE THIS RANGE. ACETAMINOPHEN CONCENTRATIONS >150 ug/mL AT 4 HOURS AFTER INGESTION AND >50 ug/mL AT 12 HOURS AFTER INGESTION ARE OFTEN ASSOCIATED WITH TOXIC REACTIONS.   CBC     Status: None   Collection Time: 06/15/14 11:48 PM  Result Value Ref Range   WBC 9.3 4.0 - 10.5 K/uL   RBC 4.90 4.22 - 5.81 MIL/uL   Hemoglobin 14.3 13.0 - 17.0 g/dL   HCT 42.0 39.0 - 52.0 %   MCV 85.7 78.0 - 100.0 fL   MCH 29.2 26.0 - 34.0 pg   MCHC 34.0 30.0 - 36.0 g/dL   RDW 15.3 11.5 - 15.5 %   Platelets 263 150 - 400 K/uL  Comprehensive metabolic panel     Status: Abnormal   Collection Time: 06/15/14 11:48 PM  Result Value Ref Range   Sodium 129 (L) 135 - 145 mmol/L   Potassium 2.9 (L) 3.5 - 5.1 mmol/L   Chloride 98 96 - 112 mmol/L   CO2 23 19 - 32 mmol/L   Glucose, Bld 116 (H) 70 - 99 mg/dL   BUN 11 6 - 23 mg/dL   Creatinine, Ser 0.92 0.50 - 1.35 mg/dL   Calcium 8.4 8.4 - 10.5 mg/dL   Total Protein 8.0 6.0 - 8.3 g/dL   Albumin 4.4 3.5 -  5.2 g/dL   AST 32 0 - 37 U/L   ALT 32 0 - 53 U/L   Alkaline Phosphatase 73 39 - 117 U/L   Total Bilirubin 1.0 0.3  - 1.2 mg/dL   GFR calc non Af Amer >90 >90 mL/min   GFR calc Af Amer >90 >90 mL/min    Comment: (NOTE) The eGFR has been calculated using the CKD EPI equation. This calculation has not been validated in all clinical situations. eGFR's persistently <90 mL/min signify possible Chronic Kidney Disease.    Anion gap 8 5 - 15  Ethanol (ETOH)     Status: Abnormal   Collection Time: 06/15/14 11:48 PM  Result Value Ref Range   Alcohol, Ethyl (B) 151 (H) 0 - 9 mg/dL    Comment:        LOWEST DETECTABLE LIMIT FOR SERUM ALCOHOL IS 11 mg/dL FOR MEDICAL PURPOSES ONLY   Salicylate level     Status: None   Collection Time: 06/15/14 11:48 PM  Result Value Ref Range   Salicylate Lvl <1.6 2.8 - 20.0 mg/dL  Basic metabolic panel     Status: Abnormal   Collection Time: 06/16/14  4:58 AM  Result Value Ref Range   Sodium 135 135 - 145 mmol/L   Potassium 3.4 (L) 3.5 - 5.1 mmol/L   Chloride 102 96 - 112 mmol/L   CO2 27 19 - 32 mmol/L   Glucose, Bld 109 (H) 70 - 99 mg/dL   BUN 14 6 - 23 mg/dL   Creatinine, Ser 1.14 0.50 - 1.35 mg/dL   Calcium 8.5 8.4 - 10.5 mg/dL   GFR calc non Af Amer 77 (L) >90 mL/min   GFR calc Af Amer 89 (L) >90 mL/min    Comment: (NOTE) The eGFR has been calculated using the CKD EPI equation. This calculation has not been validated in all clinical situations. eGFR's persistently <90 mL/min signify possible Chronic Kidney Disease.    Anion gap 6 5 - 15  Urine Drug Screen     Status: Abnormal   Collection Time: 06/16/14  7:35 AM  Result Value Ref Range   Opiates NONE DETECTED NONE DETECTED   Cocaine NONE DETECTED NONE DETECTED   Benzodiazepines POSITIVE (A) NONE DETECTED   Amphetamines NONE DETECTED NONE DETECTED   Tetrahydrocannabinol NONE DETECTED NONE DETECTED   Barbiturates NONE DETECTED NONE DETECTED    Comment:        DRUG SCREEN FOR MEDICAL PURPOSES ONLY.  IF CONFIRMATION IS NEEDED FOR ANY PURPOSE, NOTIFY LAB WITHIN 5 DAYS.        LOWEST DETECTABLE  LIMITS FOR URINE DRUG SCREEN Drug Class       Cutoff (ng/mL) Amphetamine      1000 Barbiturate      200 Benzodiazepine   109 Tricyclics       604 Opiates          300 Cocaine          300 THC              50    Current Medications: Current Facility-Administered Medications  Medication Dose Route Frequency Provider Last Rate Last Dose  . acetaminophen (TYLENOL) tablet 650 mg  650 mg Oral Q4H PRN Delfin Gant, NP      . alum & mag hydroxide-simeth (MAALOX/MYLANTA) 200-200-20 MG/5ML suspension 30 mL  30 mL Oral PRN Delfin Gant, NP      . aspirin EC tablet 81 mg  81 mg Oral Daily  Niel Hummer, NP   81 mg at 06/17/14 0820  . atorvastatin (LIPITOR) tablet 80 mg  80 mg Oral q1800 Niel Hummer, NP      . clopidogrel (PLAVIX) tablet 75 mg  75 mg Oral Daily Niel Hummer, NP   75 mg at 06/17/14 0820  . hydrochlorothiazide (MICROZIDE) capsule 12.5 mg  12.5 mg Oral Daily Niel Hummer, NP   12.5 mg at 06/17/14 8309  . hydrOXYzine (ATARAX/VISTARIL) tablet 25 mg  25 mg Oral Q6H PRN Laverle Hobby, PA-C   25 mg at 06/17/14 1105  . ibuprofen (ADVIL,MOTRIN) tablet 600 mg  600 mg Oral Q8H PRN Delfin Gant, NP      . metoprolol tartrate (LOPRESSOR) tablet 12.5 mg  12.5 mg Oral BID Niel Hummer, NP   12.5 mg at 06/17/14 4076  . nitroGLYCERIN (NITROSTAT) SL tablet 0.4 mg  0.4 mg Sublingual Q5 Min x 3 PRN Niel Hummer, NP      . ondansetron [facility]) tablet 4 mg  4 mg Oral Q8H PRN Delfin Gant, NP      . pantoprazole (PROTONIX) EC tablet 40 mg  40 mg Oral Daily Niel Hummer, NP   40 mg at 06/17/14 8088  . traZODone (DESYREL) tablet 50 mg  50 mg Oral QHS PRN Niel Hummer, NP   50 mg at 06/16/14 2210   PTA Medications: Prescriptions prior to admission  Medication Sig Dispense Refill Last Dose  . aspirin 81 MG EC tablet Take 1 tablet (81 mg total) by mouth daily. 30 tablet 12 Past Week at Unknown time  . atorvastatin (LIPITOR) 80 MG tablet Take 1 tablet (80 mg total) by mouth  daily at 6 PM. 30 tablet 5 Past Week at Unknown time  . clopidogrel (PLAVIX) 75 MG tablet Take 1 tablet (75 mg total) by mouth daily. 30 tablet 5 Past Week at Unknown time  . diazepam (VALIUM) 5 MG tablet Take 1 tablet (5 mg total) by mouth daily as needed for anxiety. 7 tablet 0 Past Week at Unknown time  . escitalopram (LEXAPRO) 20 MG tablet Take 1 tablet (20 mg total) by mouth daily. 30 tablet 0 Past Week at Unknown time  . gabapentin (NEURONTIN) 100 MG capsule Take 1 capsule (100 mg total) by mouth 3 (three) times daily. 90 capsule 0 Past Week at Unknown time  . hydrochlorothiazide (MICROZIDE) 12.5 MG capsule Take 1 capsule (12.5 mg total) by mouth daily. 30 capsule 5 Past Week at Unknown time  . metoprolol tartrate (LOPRESSOR) 25 MG tablet Take 0.5 tablets (12.5 mg total) by mouth 2 (two) times daily. 60 tablet 5 Past Week at Unknown time  . nitroGLYCERIN (NITROSTAT) 0.4 MG SL tablet Place 1 tablet (0.4 mg total) under the tongue every 5 (five) minutes x 3 doses as needed for chest pain. 25 tablet 12 Past Week at Unknown time  . pantoprazole (PROTONIX) 40 MG tablet Take 1 tablet (40 mg total) by mouth daily. 30 tablet 0 Past Week at Unknown time  . traZODone (DESYREL) 50 MG tablet Take 1 tablet (50 mg total) by mouth at bedtime as needed for sleep. 14 tablet 0 Past Week at Unknown time    Previous Psychotropic Medications: Yes   Substance Abuse History in the last 12 months:  Yes.      Consequences of Substance Abuse: NA  Results for orders placed or performed during the hospital encounter of 06/15/14 (from the past 72 hour(s))  Acetaminophen level     Status: Abnormal   Collection Time: 06/15/14 11:48 PM  Result Value Ref Range   Acetaminophen (Tylenol), Serum <10.0 (L) 10 - 30 ug/mL    Comment:        THERAPEUTIC CONCENTRATIONS VARY SIGNIFICANTLY. A RANGE OF 10-30 ug/mL MAY BE AN EFFECTIVE CONCENTRATION FOR MANY PATIENTS. HOWEVER, SOME ARE BEST TREATED AT CONCENTRATIONS OUTSIDE  THIS RANGE. ACETAMINOPHEN CONCENTRATIONS >150 ug/mL AT 4 HOURS AFTER INGESTION AND >50 ug/mL AT 12 HOURS AFTER INGESTION ARE OFTEN ASSOCIATED WITH TOXIC REACTIONS.   CBC     Status: None   Collection Time: 06/15/14 11:48 PM  Result Value Ref Range   WBC 9.3 4.0 - 10.5 K/uL   RBC 4.90 4.22 - 5.81 MIL/uL   Hemoglobin 14.3 13.0 - 17.0 g/dL   HCT 42.0 39.0 - 52.0 %   MCV 85.7 78.0 - 100.0 fL   MCH 29.2 26.0 - 34.0 pg   MCHC 34.0 30.0 - 36.0 g/dL   RDW 15.3 11.5 - 15.5 %   Platelets 263 150 - 400 K/uL  Comprehensive metabolic panel     Status: Abnormal   Collection Time: 06/15/14 11:48 PM  Result Value Ref Range   Sodium 129 (L) 135 - 145 mmol/L   Potassium 2.9 (L) 3.5 - 5.1 mmol/L   Chloride 98 96 - 112 mmol/L   CO2 23 19 - 32 mmol/L   Glucose, Bld 116 (H) 70 - 99 mg/dL   BUN 11 6 - 23 mg/dL   Creatinine, Ser 0.92 0.50 - 1.35 mg/dL   Calcium 8.4 8.4 - 10.5 mg/dL   Total Protein 8.0 6.0 - 8.3 g/dL   Albumin 4.4 3.5 - 5.2 g/dL   AST 32 0 - 37 U/L   ALT 32 0 - 53 U/L   Alkaline Phosphatase 73 39 - 117 U/L   Total Bilirubin 1.0 0.3 - 1.2 mg/dL   GFR calc non Af Amer >90 >90 mL/min   GFR calc Af Amer >90 >90 mL/min    Comment: (NOTE) The eGFR has been calculated using the CKD EPI equation. This calculation has not been validated in all clinical situations. eGFR's persistently <90 mL/min signify possible Chronic Kidney Disease.    Anion gap 8 5 - 15  Ethanol (ETOH)     Status: Abnormal   Collection Time: 06/15/14 11:48 PM  Result Value Ref Range   Alcohol, Ethyl (B) 151 (H) 0 - 9 mg/dL    Comment:        LOWEST DETECTABLE LIMIT FOR SERUM ALCOHOL IS 11 mg/dL FOR MEDICAL PURPOSES ONLY   Salicylate level     Status: None   Collection Time: 06/15/14 11:48 PM  Result Value Ref Range   Salicylate Lvl <1.7 2.8 - 20.0 mg/dL  Basic metabolic panel     Status: Abnormal   Collection Time: 06/16/14  4:58 AM  Result Value Ref Range   Sodium 135 135 - 145 mmol/L   Potassium  3.4 (L) 3.5 - 5.1 mmol/L   Chloride 102 96 - 112 mmol/L   CO2 27 19 - 32 mmol/L   Glucose, Bld 109 (H) 70 - 99 mg/dL   BUN 14 6 - 23 mg/dL   Creatinine, Ser 1.14 0.50 - 1.35 mg/dL   Calcium 8.5 8.4 - 10.5 mg/dL   GFR calc non Af Amer 77 (L) >90 mL/min   GFR calc Af Amer 89 (L) >90 mL/min    Comment: (NOTE) The eGFR has been calculated  using the CKD EPI equation. This calculation has not been validated in all clinical situations. eGFR's persistently <90 mL/min signify possible Chronic Kidney Disease.    Anion gap 6 5 - 15  Urine Drug Screen     Status: Abnormal   Collection Time: 06/16/14  7:35 AM  Result Value Ref Range   Opiates NONE DETECTED NONE DETECTED   Cocaine NONE DETECTED NONE DETECTED   Benzodiazepines POSITIVE (A) NONE DETECTED   Amphetamines NONE DETECTED NONE DETECTED   Tetrahydrocannabinol NONE DETECTED NONE DETECTED   Barbiturates NONE DETECTED NONE DETECTED    Comment:        DRUG SCREEN FOR MEDICAL PURPOSES ONLY.  IF CONFIRMATION IS NEEDED FOR ANY PURPOSE, NOTIFY LAB WITHIN 5 DAYS.        LOWEST DETECTABLE LIMITS FOR URINE DRUG SCREEN Drug Class       Cutoff (ng/mL) Amphetamine      1000 Barbiturate      200 Benzodiazepine   299 Tricyclics       242 Opiates          300 Cocaine          300 THC              50     Observation Level/Precautions:  15 minute checks  Laboratory:  per ED  Psychotherapy:  Group  Medications:  As per medlist  Consultations:  As needed  Discharge Concerns:  SAFETY  Estimated LOS:  2-7 DAYS  Other:     Psychological Evaluations: Yes   Treatment Plan Summary: Admit for crisis management and mood stabilization. Medication management to re-stabilize current mood symptoms Group counseling sessions for coping skills Medical consults as needed Review and reinstate any pertinent home medications for other health problems   Medical Decision Making:  Review of Psycho-Social Stressors (1), Review or order clinical lab  tests (1), Discuss test with performing physician (1), Review and summation of old records (2), Review of Medication Regimen & Side Effects (2) and Review of New Medication or Change in Dosage (2)  I certify that inpatient services furnished can reasonably be expected to improve the patient's condition.   Freda Munro May Agustin AGNP-BC 4/20/201612:53 PM I personally assessed the patient, reviewed the physical exam and labs and formulated the treatment plan Geralyn Flash A. Sabra Heck, M.D.

## 2014-06-17 NOTE — Progress Notes (Signed)
Pt attended NA group this evening.  

## 2014-06-17 NOTE — [facility] Group Notes (Signed)
[facility] LCSW Group Therapy  06/17/2014 1:13 PM  Type of Therapy:  Group Therapy  Participation Level:  Active  Participation Quality:  Attentive  Affect:  Depressed and Flat  Cognitive:  Alert and Oriented  Insight:  Improving  Engagement in Therapy:  Improving  Modes of Intervention:  Confrontation, Discussion, Education, Exploration, Problem-solving, Rapport Building, Socialization and Support  Summary of Progress/Problems: Today's Topic: Overcoming Obstacles. Pt identified obstacles faced currently and processed barriers involved in overcoming these obstacles. Pt identified steps necessary for overcoming these obstacles and explored motivation (internal and external) for facing these difficulties head on. Pt further identified one area of concern in their lives and chose a skill of focus pulled from their "toolbox." [name] was attentive and engaged during today's processing group. He shared that his biggest obstacles involve "cravings and thinking about my mom and best friends' deaths." [name] shared that he struggles with reliving their deaths, including nightmares, flashbacks, and ruminations. [name] talked with other patients about these symptoms and how they have been influencing his substance abuse. He continues to show progress in the group setting and improving insight.   Smart, Elliana Bal LCSWA  06/17/2014, 1:13 PM

## 2014-06-18 NOTE — Plan of Care (Signed)
Problem: Diagnosis: Increased Risk For Suicide Attempt Goal: STG-Patient Will Attend All Groups On The Unit Outcome: Progressing Pt actively participated in NA this evening.

## 2014-06-18 NOTE — Progress Notes (Addendum)
D: Pt presents flat in affect and anxious in mood. Pt verbalizes a decrease in his anxiety from day shift. Pt states that he is seeing an improvement in his mood with the initiation of the Neurontin. Pt is currently negative for any SI/HI/AVH. No active withdrawals endorsed by pt. Pt is visible and active within the milieu.  A: Writer administered scheduled and prn medications to pt, per MD orders. Continued support and availability as needed was extended to this pt. Staff continue to monitor pt with q5115min checks.  R: No adverse drug reactions noted. Pt receptive to treatment. Pt remains safe at this time.

## 2014-06-18 NOTE — Plan of Care (Signed)
Problem: Alteration in mood & ability to function due to Goal: STG: Patient verbalizes decreases in signs of withdrawal Outcome: Completed/Met Date Met:  06/18/14 No active withdrawals present.

## 2014-06-18 NOTE — Progress Notes (Signed)
Patient did attend the evening karaoke group. Pt was engaged and supportive but did not participate by singing a song.    

## 2014-06-18 NOTE — Progress Notes (Signed)
Pt alert and oriented. Pt attended morning group with nurses this morning. Pt participated and engaged in conversation. Pt is respectful of others and compliant with unit rules. Pt is also compliant with medications. Pt denies any SI/HI/AH/VH. Pt reports sleeping ok over night. No current concerns. RN will continue to monitor.

## 2014-06-18 NOTE — Progress Notes (Signed)
[facility]  06/18/2014 5:27 PM Janeece AgeeCharles A Grunow  MRN:  [id number] Subjective:  [name] has continued to process what led to his immediate relapse. He states he is going to reassess the relationship with his GF and if she is going to continue to bring up stuff from the past he is ready to end it although knows to be responsible for his daughter irrespective of what happens in the relationship. He states that he likes the Neurontin does not plan to pursue the Valium anymore Principal Problem: Alcohol use disorder, severe, dependence Diagnosis:   Patient Active Problem List   Diagnosis Date Noted  . Alcohol use disorder, severe, dependence [F10.20] 06/16/2014  . Alcohol intoxication [F10.129]   . Depression [F32.9]   . Alcohol dependence with uncomplicated withdrawal [F10.230]   . 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 Alcohol dependence [F10.20] 06/02/2014  . Panic attacks [F41.0] 06/02/2014  . Coronary artery disease due to lipid rich plaque [I25.10]   . Chest pain [R07.9] 03/25/2014  . Accelerated hypertension [I10] 03/04/2014  . Hypertensive heart disease [I11.9] 03/04/2014  . Tobacco abuse [Z72.0] 03/04/2014  . NSTEMI (non-ST elevated myocardial infarction) [I21.4] 03/02/2014   Total Time spent with patient: 30 minutes   Past Medical History:  Past Medical History  Diagnosis Date  . Hypertension   . Tobacco abuse   . Coronary artery disease 03/24/2014    SINGLE VESSEL CAD  . Anxiety   . Depression     Past Surgical History  Procedure Laterality Date  . Left heart catheterization with coronary angiogram Bilateral 03/04/2014    Procedure: LEFT HEART CATHETERIZATION WITH CORONARY ANGIOGRAM;  Surgeon: Kathleene Hazelhristopher D McAlhany, MD;  [facility];  Service: Cardiovascular;  Laterality: Bilateral;  . Left heart catheterization with coronary angiogram N/A 03/26/2014    Procedure: LEFT HEART CATHETERIZATION WITH CORONARY ANGIOGRAM;  Surgeon: Lennette Biharihomas A Kelly,  MD;  [facility];  Service: Cardiovascular;  Laterality: N/A;  . Cardiac catheterization  03/04/2014   Family History:  Family History  Problem Relation Age of Onset  . Hypertension Mother   . Hypertension Father   . Hypertension Other   . Diabetes Other   . Heart attack Other    Social History:  History  Alcohol Use  . 0.0 oz/week  . 0 Standard drinks or equivalent per week    Comment: beer alot, at least a 12 pack daily     History  Drug Use  . Yes  . Special: Cocaine, Marijuana    Comment: THC  cocaine   mollie      History   Social History  . Marital Status: Legally Separated    Spouse Name: N/A  . Number of Children: N/A  . Years of Education: N/A   Social History Main Topics  . Smoking status: Current Every Day Smoker -- 1.00 packs/day for 20 years  . Smokeless tobacco: Never Used  . Alcohol Use: 0.0 oz/week    0 Standard drinks or equivalent per week     Comment: beer alot, at least a 12 pack daily  . Drug Use: Yes    Special: Cocaine, Marijuana     Comment: THC  cocaine   mollie    . Sexual Activity: Yes    Birth Control/ Protection: Condom   Other Topics Concern  . None   Social History Narrative   Additional History:    Sleep: Fair  Appetite:  Fair   Assessment:   Musculoskeletal:  Strength & Muscle Tone: within normal limits Gait & Station: normal Patient leans: N/A   Psychiatric Specialty Exam: Physical Exam  Review of Systems  Constitutional: Positive for malaise/fatigue.  HENT: Negative.   Eyes: Negative.   Respiratory: Negative.   Cardiovascular: Negative.   Genitourinary: Negative.   Musculoskeletal: Negative.   Skin: Negative.   Neurological: Positive for weakness.  Endo/Heme/Allergies: Negative.   Psychiatric/Behavioral: Positive for depression and substance abuse. The patient is nervous/anxious.     Blood pressure 151/76, pulse 62, temperature 97.8 F (36.6 C), temperature source Oral, resp. rate 18, height 5'  6" (1.676 m), weight 71.215 kg (157 lb), SpO2 100 %.Body mass index is 25.35 kg/(m^2).  General Appearance: Fairly Groomed  Patent attorney::  Fair  Speech:  Clear and Coherent  Volume:  Normal  Mood:  Anxious and worried  Affect:  anxious worried  Thought Process:  Coherent and Goal Directed  Orientation:  Full (Time, Place, and Person)  Thought Content:  symptoms events worries concerns  Suicidal Thoughts:  No  Homicidal Thoughts:  No  Memory:  Immediate;   Fair Recent;   Fair Remote;   Fair  Judgement:  Fair  Insight:  Present  Psychomotor Activity:  Normal  Concentration:  Fair  Recall:  [employer] of Knowledge:Fair  Language: Fair  Akathisia:  No  Handed:  Right  AIMS (if indicated):     Assets:  Desire for Improvement Housing Social Support Vocational/Educational  ADL's:  Intact  Cognition: WNL  Sleep:  Number of Hours: 5.75     Current Medications: Current Facility-Administered Medications  Medication Dose Route Frequency Provider Last Rate Last Dose  . acetaminophen (TYLENOL) tablet 650 mg  650 mg Oral Q4H PRN Earney Navy, NP      . alum & mag hydroxide-simeth (MAALOX/MYLANTA) 200-200-20 MG/5ML suspension 30 mL  30 mL Oral PRN Earney Navy, NP      . aspirin EC tablet 81 mg  81 mg Oral Daily Thermon Leyland, NP   81 mg at 06/18/14 0847  . atorvastatin (LIPITOR) tablet 80 mg  80 mg Oral q1800 Thermon Leyland, NP   80 mg at 06/18/14 1708  . clopidogrel (PLAVIX) tablet 75 mg  75 mg Oral Daily Thermon Leyland, NP   75 mg at 06/18/14 0847  . gabapentin (NEURONTIN) capsule 100 mg  100 mg Oral TID Rachael Fee, MD   100 mg at 06/18/14 1708  . hydrochlorothiazide (MICROZIDE) capsule 12.5 mg  12.5 mg Oral Daily Thermon Leyland, NP   12.5 mg at 06/18/14 0847  . hydrOXYzine (ATARAX/VISTARIL) tablet 25 mg  25 mg Oral Q6H PRN Kerry Hough, PA-C   25 mg at 06/18/14 0850  . ibuprofen (ADVIL,MOTRIN) tablet 600 mg  600 mg Oral Q8H PRN Earney Navy, NP      .  metoprolol tartrate (LOPRESSOR) tablet 12.5 mg  12.5 mg Oral BID Thermon Leyland, NP   12.5 mg at 06/18/14 1709  . nitroGLYCERIN (NITROSTAT) SL tablet 0.4 mg  0.4 mg Sublingual Q5 Min x 3 PRN Thermon Leyland, NP      . ondansetron [facility]) tablet 4 mg  4 mg Oral Q8H PRN Earney Navy, NP      . pantoprazole (PROTONIX) EC tablet 40 mg  40 mg Oral Daily Thermon Leyland, NP   40 mg at 06/18/14 0847  . traZODone (DESYREL) tablet 50 mg  50 mg Oral QHS PRN Gerome Apley  Earlene Plater, NP   50 mg at 06/17/14 2145    Lab Results: No results found for this or any previous visit (from the past 48 hour(s)).  Physical Findings: AIMS: Facial and Oral Movements Muscles of Facial Expression: None, normal Lips and Perioral Area: None, normal Jaw: None, normal Tongue: None, normal,Extremity Movements Upper (arms, wrists, hands, fingers): None, normal Lower (legs, knees, ankles, toes): None, normal, Trunk Movements Neck, shoulders, hips: None, normal, Overall Severity Severity of abnormal movements (highest score from questions above): None, normal Incapacitation due to abnormal movements: None, normal Patient's awareness of abnormal movements (rate only patient's report): No Awareness, Dental Status Current problems with teeth and/or dentures?: No Does patient usually wear dentures?: No  CIWA:  CIWA-Ar Total: 2 COWS:  COWS Total Score: 2  Treatment Plan Summary: Daily contact with patient to assess and evaluate symptoms and progress in treatment and Medication management Supportive approach/coping skills Alcohol dependence: continue to work a relapse prevention plan.  We are going to work on alternatives to drinking if he was to feel under stress secondary to  Relationship issues:  deep breathing, relaxation techniques, imaginary, journaling, exercise, going to an extra meeting etc.  Anxiety; will continue to work with the Neurontin and optimize response Will discuss using Campral for alcohol cravings    Medical  Decision Making:  Review of Psycho-Social Stressors (1), Review or order clinical lab tests (1), Review of Medication Regimen & Side Effects (2) and Review of New Medication or Change in Dosage (2)     Raveen Wieseler A 06/18/2014, 5:27 PM

## 2014-06-18 NOTE — [facility] Group Notes (Signed)
[facility] LCSW Group Therapy  06/18/2014 1:02 PM  Type of Therapy:  Group Therapy  Participation Level:  Active  Participation Quality:  Attentive  Affect:  Appropriate  Cognitive:  Alert and Oriented  Insight:  Engaged  Engagement in Therapy:  Engaged  Modes of Intervention:  Confrontation, Discussion, Education, Exploration, Problem-solving, Rapport Building, Socialization and Support  Summary of Progress/Problems:  Finding Balance in Life. Today's group focused on defining balance in one's own words, identifying things that can knock one off balance, and exploring healthy ways to maintain balance in life. Group members were asked to provide an example of a time when they felt off balance, describe how they handled that situation,and process healthier ways to regain balance in the future. Group members were asked to share the most important tool for maintaining balance that they [facility] and how they plan to apply this method after discharge. [name] was attentive and engaged during today's processing group. He shared that for him, balance involves "staying sober, getting on the right meds, and being able to see my kids." [name] talked about his struggle with mood instability and his attempts to self medicate with drugs and alcohol. He acknowledged that in the long run, this makes his symptoms worse and has caused problems with him and his loved ones. Cashawn plans to start golfing more "it's relaxing for me." He continues to show progress in the group setting and improving insight.   Smart, Maeby Vankleeck LCSWA 06/18/2014, 1:02 PM

## 2014-06-18 NOTE — [facility] Suicide Risk Assessment (Addendum)
[facility] INPATIENT:  Family/Significant Other Suicide Prevention Education  Suicide Prevention Education:  Contact Attempts: Darren [name] (pt's father) [phone] has been identified by the patient as the family member/significant other with whom the patient will be residing, and identified as the person(s) who will aid the patient in the event of a mental health crisis.  With written consent from the patient, two attempts were made to provide suicide prevention education, prior to and/or following the patient's discharge.  We were unsuccessful in providing suicide prevention education.  A suicide education pamphlet was given to the patient to share with family/significant other. Pt denies SI/HI/AVH and denies access to guns and firearms. Pt also was given Mobile Crisis information and contact number. He verbalized understanding of all information provided and was given an opportunity to ask questions and discuss any concerns.   Date and time of first attempt: 06/17/14 4:00PM (VM left requesting call back)  Date and time of second attempt: 06/18/14 at 10:55AM (VM left requesting call back)   Smart, Lebron QuamHeather LCSWA 06/18/2014, 10:55 AM   SPE completed with pt's stepfather, Darren [name], who voiced no concerns regarding pt's discharge today. Aftercare plan and SPE info reviewed with pt's stepfather and he verbalized understanding.   The Sherwin-WilliamsHeather Smart, LCSWA 06/19/2014 11:51 AM

## 2014-06-18 NOTE — Progress Notes (Signed)
D.  Pt pleasant on approach, no complaints voiced.  Positive for evening karaoke group, interacting appropriately with peers on the unit.  Denies SI/HI/hallucinations at this time.  A.  Support and encouragement offered  R. Pt remains safe on the unit, will continue to monitor.

## 2014-06-18 NOTE — Clinical Social Work Note (Signed)
CSW met with pt individually to discuss aftercare/needs. Pt requested that CSW contacted [employer] Lank (FMLA/HR) [phone] to request FMLA paperwork and provide fax. Pt signed consent and CSW left message for [place] requesting this information.  CSW left message with [facility] requesting that his 8am appt tomorrow morning be cancelled and rescheduled for a later date, due to his hospitalization. CSW requested call back to reschedule at earliest convenience.   Per pt request psychiatry referral made to [facility]. Pt aware that he must follow-up with his PCP Dr. Ricka Burdock [facility]) until he can be seen in June. Appt also scheduled with his PCP for med management/hospiatl follow-up.   [place], [place] 06/18/2014 10:55 AM

## 2014-06-19 ENCOUNTER — Encounter (HOSPITAL_COMMUNITY): Payer: BLUE CROSS/BLUE SHIELD

## 2014-06-19 MED ORDER — TRAZODONE HCL 50 MG PO TABS: 50.0000 mg | ORAL_TABLET | Freq: Every evening | ORAL | Status: DC | PRN

## 2014-06-19 MED ORDER — ATORVASTATIN CALCIUM 80 MG PO TABS: 80.0000 mg | ORAL_TABLET | Freq: Every day | ORAL | Status: DC

## 2014-06-19 MED ORDER — HYDROXYZINE HCL 25 MG PO TABS: 25.0000 mg | ORAL_TABLET | Freq: Four times a day (QID) | ORAL | Status: DC | PRN

## 2014-06-19 MED ORDER — GABAPENTIN 100 MG PO CAPS
100.0000 mg | ORAL_CAPSULE | Freq: Three times a day (TID) | ORAL | Status: DC
Start: 2014-06-19 — End: 2014-08-06

## 2014-06-19 MED ORDER — METOPROLOL TARTRATE 25 MG PO TABS: 12.5000 mg | ORAL_TABLET | Freq: Two times a day (BID) | ORAL | Status: DC

## 2014-06-19 MED ORDER — ASPIRIN 81 MG PO TBEC: 81.0000 mg | DELAYED_RELEASE_TABLET | Freq: Every day | ORAL | Status: DC

## 2014-06-19 MED ORDER — CLOPIDOGREL BISULFATE 75 MG PO TABS: 75.0000 mg | ORAL_TABLET | Freq: Every day | ORAL | Status: DC

## 2014-06-19 MED ORDER — HYDROCHLOROTHIAZIDE 12.5 MG PO CAPS: 12.5000 mg | ORAL_CAPSULE | Freq: Every day | ORAL | Status: DC

## 2014-06-19 MED ORDER — NITROGLYCERIN 0.4 MG SL SUBL: 0.4000 mg | SUBLINGUAL_TABLET | SUBLINGUAL | Status: DC | PRN

## 2014-06-19 MED ORDER — PANTOPRAZOLE SODIUM 40 MG PO TBEC
40.0000 mg | DELAYED_RELEASE_TABLET | Freq: Every day | ORAL | Status: DC
Start: 2014-06-19 — End: 2015-11-11

## 2014-06-19 NOTE — Progress Notes (Signed)
Discharge Note:  Patient discharged with family members.  Denied SI and HI.  Denied A/V hallucinations.  Denied pain.  Suicide prevention information given and discussed with patient who stated he understood and had no questions.  Patient stated he received all his belongings, clothing, toiletries, prescriptions, medications, cigarette, laces, keys, [place], card, wallet, belt, back pack.  Patient stated he [facility] all [facility].

## 2014-06-19 NOTE — [facility] Suicide Risk Assessment (Signed)
[facility]   Demographic Factors:  Male  Total Time spent with patient: 30 minutes  Musculoskeletal: Strength & Muscle Tone: within normal limits Gait & Station: normal Patient leans: N/A  Psychiatric Specialty Exam: Physical Exam  Review of Systems  Constitutional: Negative.   HENT: Negative.   Eyes: Negative.   Respiratory: Negative.   Cardiovascular: Negative.   Gastrointestinal: Negative.   Genitourinary: Negative.   Musculoskeletal: Negative.   Skin: Negative.   Neurological: Negative.   Endo/Heme/Allergies: Negative.   Psychiatric/Behavioral: Positive for substance abuse. The patient is nervous/anxious.     Blood pressure 134/80, pulse 73, temperature 97.8 F (36.6 C), temperature source Oral, resp. rate 16, height 5\' 6"  (1.676 m), weight 71.215 kg (157 lb), SpO2 100 %.Body mass index is 25.35 kg/(m^2).  General Appearance: Fairly Groomed  Patent attorneyye Contact::  Fair  Speech:  Clear and Coherent409  Volume:  Normal  Mood:  Euthymic  Affect:  Appropriate  Thought Process:  Coherent and Goal Directed  Orientation:  Full (Time, Place, and Person)  Thought Content:  plans as he moves on, relapse prevention plan  Suicidal Thoughts:  No  Homicidal Thoughts:  No  Memory:  Immediate;   Fair Recent;   Fair Remote;   Fair  Judgement:  Fair  Insight:  Present  Psychomotor Activity:  Restlessness  Concentration:  Fair  Recall:  [employer]Fair  Fund of Knowledge:Fair  Language: Fair  Akathisia:  No  Handed:  Right  AIMS (if indicated):     Assets:  Desire for Improvement Housing Social Support Vocational/Educational  Sleep:  Number of Hours: 6.25  Cognition: WNL  ADL's:  Intact   Have you used any form of tobacco in the last 30 days? (Cigarettes, Smokeless Tobacco, Cigars, and/or Pipes): Yes  Has this patient used any form of tobacco in the last 30 days? (Cigarettes, Smokeless Tobacco, Cigars, and/or Pipes) Yes, A prescription for an FDA-approved  tobacco cessation medication was offered at discharge and the patient refused  Mental Status Per Nursing Assessment::   On Admission:  Suicidal ideation indicated by patient, Suicide plan  Current Mental Status by Physician: IN full contact with reality. There are no active S/S of withdrawal. There are no active SI plans or intent. He states he spoke with his GF last night and that she is back bringing things from the past. She told him she cant let go. He states that if she cant he is not going to pursue the relationship anymore. Irrespective of what happens with the relationship he is clear that choosing to cope with the distress, the frustration, he might experience by drinking is not the way to go. He has worked on developing a series of healthier coping mechanisms as part of his relapse prevention plan. He is comfortable with the Neurontin as a substitute to the Valium. He plans to go back to work Monday and continue to work on his sobriety by going to AA   Loss Factors: Loss of significant relationship  Historical Factors: NA  Risk Reduction Factors:   Responsible for children under 45 years of age, Sense of responsibility to family, Employed, Living with another person, especially a relative and Positive social support  Continued Clinical Symptoms:  Depression:   Comorbid alcohol abuse/dependence Alcohol/Substance Abuse/Dependencies  Cognitive Features That Contribute To Risk:  Closed-mindedness, Polarized thinking and Thought constriction (tunnel vision)    Suicide Risk:  Minimal: No identifiable suicidal ideation.  Patients presenting with no risk factors but with  morbid ruminations; may be classified as minimal risk based on the severity of the depressive symptoms  Principal Problem: Alcohol use disorder, severe, dependence Discharge Diagnoses:  Patient Active Problem List   Diagnosis Date Noted  . Alcohol use disorder, severe, dependence [F10.20] 06/16/2014  . Alcohol  intoxication [F10.129]   . Depression [F32.9]   . Alcohol dependence with uncomplicated withdrawal [F10.230]   . 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 Alcohol dependence [F10.20] 06/02/2014  . Panic attacks [F41.0] 06/02/2014  . Coronary artery disease due to lipid rich plaque [I25.10]   . Chest pain [R07.9] 03/25/2014  . Accelerated hypertension [I10] 03/04/2014  . Hypertensive heart disease [I11.9] 03/04/2014  . Tobacco abuse [Z72.0] 03/04/2014  . NSTEMI (non-ST elevated myocardial infarction) [I21.4] 03/02/2014    Follow-up Information    Follow up with [facility] On 06/25/2014.   Why:  Appt for therapy on this date at 3:00PM.    Contact information:   ATTN: Dr. Lady Gary [address] [place], [place] [address] Phone: [phone] Fax: [phone]      Follow up with [facility]  On 06/24/2014.   Why:  Appt for hospital follow-up/medication management on this date at 4:00PM.    Contact information:   ATTN: Dr. Ma Hillock  [address] [place], [place] [address] Phone: [phone] Fax: [phone]      Follow up with [facility] On 08/06/2014.   Why:  Appt on this date for medication management at 12:30PM with Dr. Lolly Mustache.     Contact information:   [address] [place], [place] [address] Phone: [phone] Fax: [phone]      Plan Of Care/Follow-up recommendations:  Activity:  as tolerated Diet:  heart healthy Follow up [facility] psychotherapy as above Is patient on multiple antipsychotic therapies at discharge:  No   Has Patient had three or more failed trials of antipsychotic monotherapy by history:  No  Recommended Plan for Multiple Antipsychotic Therapies: NA    Roney Youtz A 06/19/2014, 10:30 AM

## 2014-06-19 NOTE — Plan of Care (Signed)
Problem: Diagnosis: Increased Risk For Suicide Attempt Goal: LTG-Patient Will Report Improved Mood and Deny Suicidal LTG (by discharge) Patient will report improved mood and deny suicidal ideation.  Outcome: Progressing Pt mood pleasant and bright this evening, denied suicidal ideation

## 2014-06-19 NOTE — Discharge Summary (Signed)
Physician Discharge Summary Note  Patient:  Darren AgeeCharles Darren [name] is an 44 y.o., male MRN:  [id number] DOB:  1970-04-07 Patient phone:  [phone] (home)  Patient address[place]   [address] [place] [address],  Total Time spent with patient: Greater than 30 minutes  Date of Admission:  06/16/2014  Date of Discharge: 06/19/14  Reason for Admission:  Alcohol intoxication  Principal Problem: Alcohol use disorder, severe, dependence Discharge Diagnoses: Patient Active Problem List   Diagnosis Date Noted  . Alcohol use disorder, severe, dependence [F10.20] 06/16/2014  . Alcohol intoxication [F10.129]   . Depression [F32.9]   . Alcohol dependence with uncomplicated withdrawal [F10.230]   . 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 Alcohol dependence [F10.20] 06/02/2014  . Panic attacks [F41.0] 06/02/2014  . Coronary artery disease due to lipid rich plaque [I25.10]   . Chest pain [R07.9] 03/25/2014  . Accelerated hypertension [I10] 03/04/2014  . Hypertensive heart disease [I11.9] 03/04/2014  . Tobacco abuse [Z72.0] 03/04/2014  . NSTEMI (non-ST elevated myocardial infarction) [I21.4] 03/02/2014   Musculoskeletal: Strength & Muscle Tone: within normal limits Gait & Station: normal Patient leans: N/Darren  Psychiatric Specialty Exam: Physical Exam  Psychiatric: His speech is normal and behavior is normal. Judgment and thought content normal. His mood appears not anxious. His affect is not angry, not blunt, not labile and not inappropriate. Cognition and memory are normal. He does not exhibit Darren depressed mood.    Review of Systems  Constitutional: Negative.   HENT: Negative.   Eyes: Negative.   Respiratory: Negative.   Cardiovascular: Negative.   Gastrointestinal: Negative.   Genitourinary: Negative.   Musculoskeletal: Negative.   Skin: Negative.   Neurological: Negative.   Endo/Heme/Allergies: Negative.   Psychiatric/Behavioral: Positive for depression (Stable) and  substance abuse (Alcohol dependence ). Negative for suicidal ideas, hallucinations and memory loss. The patient has insomnia (Stable). The patient is not nervous/anxious.     Blood pressure 134/80, pulse 73, temperature 97.8 F (36.6 C), temperature source Oral, resp. rate 16, height 5\' 6"  (1.676 m), weight 71.215 kg (157 lb), SpO2 100 %.Body mass index is 25.35 kg/(m^2).  See Md's SRA   Past Medical History:  Past Medical History  Diagnosis Date  . Hypertension   . Tobacco abuse   . Coronary artery disease 03/24/2014    SINGLE VESSEL CAD  . Anxiety   . Depression     Past Surgical History  Procedure Laterality Date  . Left heart catheterization with coronary angiogram Bilateral 03/04/2014    Procedure: LEFT HEART CATHETERIZATION WITH CORONARY ANGIOGRAM;  Surgeon: Kathleene Hazelhristopher D McAlhany, MD;  [facility];  Service: Cardiovascular;  Laterality: Bilateral;  . Left heart catheterization with coronary angiogram N/Darren 03/26/2014    Procedure: LEFT HEART CATHETERIZATION WITH CORONARY ANGIOGRAM;  Surgeon: Lennette Biharihomas Darren Kelly, MD;  [facility];  Service: Cardiovascular;  Laterality: N/Darren;  . Cardiac catheterization  03/04/2014   Family History:  Family History  Problem Relation Age of Onset  . Hypertension Mother   . Hypertension Father   . Hypertension Other   . Diabetes Other   . Heart attack Other    Social History:  History  Alcohol Use  . 0.0 oz/week  . 0 Standard drinks or equivalent per week    Comment: beer alot, at least Darren 12 pack daily     History  Drug Use  . Yes  . Special: Cocaine, Marijuana    Comment: THC  cocaine   mollie  History   Social History  . Marital Status: Legally Separated    Spouse Name: N/Darren  . Number of Children: N/Darren  . Years of Education: N/Darren   Social History Main Topics  . Smoking status: Current Every Day Smoker -- 1.00 packs/day for 20 years  . Smokeless tobacco: Never Used  . Alcohol Use: 0.0 oz/week    0 Standard drinks  or equivalent per week     Comment: beer alot, at least Darren 12 pack daily  . Drug Use: Yes    Special: Cocaine, Marijuana     Comment: THC  cocaine   mollie    . Sexual Activity: Yes    Birth Control/ Protection: Condom   Other Topics Concern  . None   Social History Narrative   Risk to Self: Is patient at risk for suicide?: No  Risk to Others: No  Prior Inpatient Therapy: No  Prior Outpatient Therapy: No  Level of Care:  OP  Hospital Course:  Darren Heyde is Darren 44 y.o. male patient admitted with Alcohol induced mood disorder. He was evaluated for suicidal ideation with no plans. He was also intoxicated with Alcohol level of 151 on arrival. Patient stated " I feel like killing myself, I do not want to live anymore"He was discharged from our inpatient Psychiatric unit early part of the month for same compliant.This time he had an argument with his girl friend and stated that he is not able to handle the stress in the relationship. Patient stated thathis stress and anxiety makes him relapse on Alcohol each time he is sober. Patient reported that he has no planned method to kill himself but stated he is not willing to live any more. He denies HI/AVH.   Darren [name] was discharged from this hospital on 06/08/14 after alcohol detoxification treatments. He was readmitted to the adult unit again on 06/16/14 with Darren blood alcohol level of 151 per toxicology test reports and UDS positive for Benzodiazepine. He was presenting also with suicidal ideations & depressed mood. This time around, he received no detoxification treatment as he was presenting with no susbstance withdrawal symptoms. However, he did receive Trazodone 50 mg for insomnia/depression & Neurontin 100 mg for substance withdrawal syndrome. Darren [name] was enrolled and participated in the group counseling sessions being offered and held on this unit. He learned coping skills and encouraged to utilize his learned coping skills in times of  distress after discharge.  Darren [name] was also resumed on all his pertinent home medications for his other pre-existing medical issues. He tolerated his treatment regimen without any adverse effects reported. Ezekial's mood has stabilized. This is evidenced by his reports of improved mood and absence of suicidal ideations or withdrawal symptoms. He is currently being discharged to continue psychiatric treatment, medication management, counseling services and medical care as noted below. He is provided with all the necessary information needed to contact & make these appointments without problems. He left [facility] with all belongings in no apparent distress. He received Darren 4 days worth supply samples of his discharge medications. Transportation per friend.  Consults:  psychiatry  Significant Diagnostic Studies:  labs: CBC with diff, CMP, UDS, toxicology tests, u/Darren, results reviewed, stable  Discharge Vitals:   Blood pressure 134/80, pulse 73, temperature 97.8 F (36.6 C), temperature source Oral, resp. rate 16, height  (1.676 m), weight 71.215 kg (157 lb), SpO2 100 %. Body mass index is 25.35 kg/(m^2). Lab Results:   No results found for this or any  previous visit (from the past 72 hour(s)).  Physical Findings: AIMS: Facial and Oral Movements Muscles of Facial Expression: None, normal Lips and Perioral Area: None, normal Jaw: None, normal Tongue: None, normal,Extremity Movements Upper (arms, wrists, hands, fingers): None, normal Lower (legs, knees, ankles, toes): None, normal, Trunk Movements Neck, shoulders, hips: None, normal, Overall Severity Severity of abnormal movements (highest score from questions above): None, normal Incapacitation due to abnormal movements: None, normal Patient's awareness of abnormal movements (rate only patient's report): No Awareness, Dental Status Current problems with teeth and/or dentures?: No Does patient usually wear dentures?: No  CIWA:  CIWA-Ar Total:  1 COWS:  COWS Total Score: 1   See Psychiatric Specialty Exam and Suicide Risk Assessment completed by Attending Physician prior to discharge.  Discharge destination:  Home  Is patient on multiple antipsychotic therapies at discharge:  No   Has Patient had three or more failed trials of antipsychotic monotherapy by history:  No  Recommended Plan for Multiple Antipsychotic Therapies: NA    Medication List    STOP taking these medications        diazepam 5 MG tablet  Commonly known as:  VALIUM     escitalopram 20 MG tablet  Commonly known as:  LEXAPRO      TAKE these medications      Indication   aspirin 81 MG EC tablet  Take 1 tablet (81 mg total) by mouth daily. For health health   Indication:  Heart health     atorvastatin 80 MG tablet  Commonly known as:  LIPITOR  Take 1 tablet (80 mg total) by mouth daily at 6 PM. For high cholesterol   Indication:  Elevation of Both Cholesterol and Triglycerides in Blood     clopidogrel 75 MG tablet  Commonly known as:  PLAVIX  Take 1 tablet (75 mg total) by mouth daily. For prevention of blood clot   Indication:  Prevention of blood clot     gabapentin 100 MG capsule  Commonly known as:  NEURONTIN  Take 1 capsule (100 mg total) by mouth 3 (three) times daily. For substance withdrawal syndrome   Indication:  Substance withdrawal syndrome     hydrochlorothiazide 12.5 MG capsule  Commonly known as:  MICROZIDE  Take 1 capsule (12.5 mg total) by mouth daily. For high blood pressure   Indication:  High Blood Pressure     hydrOXYzine 25 MG tablet  Commonly known as:  ATARAX/VISTARIL  Take 1 tablet (25 mg total) by mouth every 6 (six) hours as needed for anxiety.   Indication:  Anxiety     metoprolol tartrate 25 MG tablet  Commonly known as:  LOPRESSOR  Take 0.5 tablets (12.5 mg total) by mouth 2 (two) times daily.   Indication:  High Blood Pressure     metoprolol tartrate 25 MG tablet  Commonly known as:  LOPRESSOR  Take  0.5 tablets (12.5 mg total) by mouth 2 (two) times daily. For high blood pressure   Indication:  High Blood Pressure     nitroGLYCERIN 0.4 MG SL tablet  Commonly known as:  NITROSTAT  Place 1 tablet (0.4 mg total) under the tongue every 5 (five) minutes x 3 doses as needed for chest pain.   Indication:  Acute Angina Pectoris     pantoprazole 40 MG tablet  Commonly known as:  PROTONIX  Take 1 tablet (40 mg total) by mouth daily. For acid reflux   Indication:  Gastroesophageal Reflux Disease  traZODone 50 MG tablet  Commonly known as:  DESYREL  Take 1 tablet (50 mg total) by mouth at bedtime as needed for sleep.   Indication:  Trouble Sleeping       Follow-up Information    Follow up with [facility]rapy On 06/25/2014.   Why:  Appt for therapy on this date at 3:00PM.    Contact information:   ATTN: Dr. Lady Gary [address] [place], [place] [address] Phone: [phone] Fax: [phone]      Follow up with [facility]  On 06/24/2014.   Why:  Appt for hospital follow-up/medication management on this date at 4:00PM.    Contact information:   ATTN: Dr. Ma Hillock  [address] [place], [place] [address] Phone: [phone] Fax: [phone]      Follow up with [facility]Medication Management/Psychiatry On 08/06/2014.   Why:  Appt on this date for medication management at 12:30PM with Dr. Lolly Mustache.     Contact information:   [address] [place], [place] [address] Phone: [phone] Fax: [phone]     Follow-up recommendations:  Activity:  As tolerated Diet: As recommended by your primary care doctor. Keep all scheduled follow-up appointments as recommended.  Comments: Take all your medications as prescribed by your mental healthcare provider. Report any adverse effects and or reactions from your medicines to your outpatient provider promptly. Patient is instructed and cautioned to not engage in  alcohol and or illegal drug use while on prescription medicines. In the event of worsening symptoms, patient is instructed to call the crisis hotline, 911 and or go to the nearest ED for appropriate evaluation and treatment of symptoms. Follow-up with your primary care provider for your other medical issues, concerns and or health care needs.   Total Discharge Time: Greater than 30 minutes  Signed: Sanjuana Kava, PMHNP-Bc 06/19/2014, 11:32 AM  I personally assessed the patient and formulated the plan Madie Reno Darren. [place], [place].D.I personally assessed the patient and formulated the plan Madie Reno Darren. Dub Mikes, M.D.

## 2014-06-19 NOTE — Progress Notes (Signed)
D:  Patient's self inventory sheet, patient slept good last night, sleep medication was helpful.  Good appetite, normal energy level, good concentration.  Rated depression and hopeless 1, anxiety 2.  Denied withdrawals.  Denied SI.  Denied physical problems.  Denied pain.  Goal is to be discharged.  Plans to sign out.  Does have discharge plans. A:  Medications administered per MD orders.  Emotional support and encouragement given patient. R:  Denied SI and HI, contracts for safety.  Denied A/V hallucinations.  Denied pain.  Safety maintained with 15 minute checks.

## 2014-06-19 NOTE — [facility] Group Notes (Signed)
[facility] Note   06/19/2014 10:15 AM  Participation Quality:  Appropriate   Mood/Affect:  Appropriate  Depression Rating: 0  Anxiety Rating:  2  Thoughts of Suicide:  No Will you contract for safety?   NA  Current AVH:  No  Plan for Discharge/Comments:  Pt plans to d/c to stepfather's home today and has followup in place with PCP, psychiatry, and therapy. Pt reports no other needs. Letter in chart for work.   Transportation Means: family member picking him up at 11-11:30AM  Supports: friends, stepfather and stepfather's girlfriend   Counselling psychologistmart, Lebron QuamHeather LCSWA

## 2014-06-19 NOTE — Progress Notes (Addendum)
  [facility] :  Will you be returning to the same living situation after discharge:  Yes,  home At discharge, do you have transportation home?: Yes,  friend coming at 11:00-11:30AM this morning Do you have the ability to pay for your medications: Yes,  [employer] private insurance  Release of information consent forms completed and submitted to Medical Records by CSW.  Patient to Follow up at: Follow-up Information    [facility] On 06/25/2014.   Why:  Appt for therapy on this date at 3:00PM.    Contact information:   ATTN: Dr. Lady Garyannon [address] [place], [place] [address] Phone: [phone] Fax: [phone]      [facility]  On 06/24/2014.   Why:  Appt for hospital follow-up/medication management on this date at 4:00PM.    Contact information:   ATTN: Dr. [name]  [address] [place], [place] [address] Phone: [phone] Fax: [phone]      [facility]iatry On 08/06/2014.   Why:  Appt on this date for medication management at 12:30PM with Dr. Lolly MustacheArfeen.     Contact information:   [address] [place], [place] [address] Phone: [phone] Fax: [phone]      Patient denies SI/HI: Yes,  during group/self report.    Safety Planning and Suicide Prevention discussed: Yes,  Contact attempts made with pt's father/stepfather. SPE completed with pt and he was encouraged to share information with support network, ask questions, and talk about any concerns relating to SPE. Mobile crisis info provided to pt as well.  Have you used any form of tobacco in the last 30 days? (Cigarettes, Smokeless Tobacco, Cigars, and/or Pipes): Yes  Has patient been referred to the Quitline?: Yes, faxed on 06/19/14  Smart, Langston Tub[place] LCSWA  06/19/2014, 10:24 AM

## 2014-06-22 ENCOUNTER — Encounter (HOSPITAL_COMMUNITY): Payer: BLUE CROSS/BLUE SHIELD

## 2014-06-23 NOTE — Progress Notes (Signed)
Patient Discharge Instructions:  After Visit Summary (AVS):   Faxed to:  06/23/14 Discharge Summary Note:   Faxed to:  06/23/14 Psychiatric Admission Assessment Note:   Faxed to:  06/23/14 Suicide Risk Assessment - Discharge Assessment:   Faxed to:  06/23/14 Next Level Care Provider Has Access to the EMR, 06/23/14 Faxed/Sent to the Next Level Care provider:  06/23/14 Faxed to [facility] @ [phone] Faxed to [facility] - Dr. [name] @ [phone] Records provided to [facility] via CHL/Epic access.  Jerelene ReddenSheena E Clifton, 06/23/2014, 2:50 PM

## 2014-06-24 ENCOUNTER — Encounter (HOSPITAL_COMMUNITY): Payer: BLUE CROSS/BLUE SHIELD

## 2014-06-26 ENCOUNTER — Encounter (HOSPITAL_COMMUNITY): Payer: BLUE CROSS/BLUE SHIELD

## 2014-06-29 ENCOUNTER — Encounter (HOSPITAL_COMMUNITY): Payer: BLUE CROSS/BLUE SHIELD

## 2014-07-01 ENCOUNTER — Encounter (HOSPITAL_COMMUNITY): Payer: BLUE CROSS/BLUE SHIELD

## 2014-07-03 ENCOUNTER — Encounter (HOSPITAL_COMMUNITY): Payer: BLUE CROSS/BLUE SHIELD

## 2014-07-06 ENCOUNTER — Encounter (HOSPITAL_COMMUNITY): Payer: BLUE CROSS/BLUE SHIELD

## 2014-07-08 ENCOUNTER — Encounter (HOSPITAL_COMMUNITY): Payer: BLUE CROSS/BLUE SHIELD

## 2014-07-10 ENCOUNTER — Encounter (HOSPITAL_COMMUNITY): Payer: BLUE CROSS/BLUE SHIELD

## 2014-07-13 ENCOUNTER — Encounter (HOSPITAL_COMMUNITY): Payer: BLUE CROSS/BLUE SHIELD

## 2014-07-15 ENCOUNTER — Encounter (HOSPITAL_COMMUNITY): Payer: BLUE CROSS/BLUE SHIELD

## 2014-07-17 ENCOUNTER — Encounter (HOSPITAL_COMMUNITY): Payer: BLUE CROSS/BLUE SHIELD

## 2014-07-20 ENCOUNTER — Encounter (HOSPITAL_COMMUNITY): Payer: BLUE CROSS/BLUE SHIELD

## 2014-07-22 ENCOUNTER — Encounter (HOSPITAL_COMMUNITY): Payer: BLUE CROSS/BLUE SHIELD

## 2014-07-24 ENCOUNTER — Encounter (HOSPITAL_COMMUNITY): Payer: BLUE CROSS/BLUE SHIELD

## 2014-08-06 ENCOUNTER — Encounter (HOSPITAL_COMMUNITY): Payer: Self-pay | Admitting: Psychiatry

## 2014-08-06 ENCOUNTER — Ambulatory Visit (INDEPENDENT_AMBULATORY_CARE_PROVIDER_SITE_OTHER): Payer: BLUE CROSS/BLUE SHIELD | Admitting: Psychiatry

## 2014-08-06 VITALS — BP 126/70 | HR 65 | Ht 67.0 in | Wt 168.2 lb

## 2014-08-06 DIAGNOSIS — F1020 Alcohol dependence, uncomplicated: Secondary | ICD-10-CM | POA: Diagnosis not present

## 2014-08-06 DIAGNOSIS — F1010 Alcohol abuse, uncomplicated: Secondary | ICD-10-CM

## 2014-08-06 DIAGNOSIS — F329 Major depressive disorder, single episode, unspecified: Secondary | ICD-10-CM

## 2014-08-06 DIAGNOSIS — F331 Major depressive disorder, recurrent, moderate: Secondary | ICD-10-CM | POA: Diagnosis not present

## 2014-08-06 HISTORY — DX: Major depressive disorder, single episode, unspecified: F32.9

## 2014-08-06 MED ORDER — HYDROXYZINE HCL 25 MG PO TABS: 25.0000 mg | ORAL_TABLET | Freq: Three times a day (TID) | ORAL | Status: DC | PRN

## 2014-08-06 MED ORDER — LAMOTRIGINE 25 MG PO TABS: ORAL_TABLET | ORAL | Status: DC

## 2014-08-06 MED ORDER — TRAZODONE HCL 50 MG PO TABS: 50.0000 mg | ORAL_TABLET | Freq: Every evening | ORAL | Status: DC | PRN

## 2014-08-06 NOTE — Progress Notes (Signed)
[facility] Initial Assessment Note  Darren [name] [id number] 44 y.o.  08/06/2014 2:17 PM  Chief Complaint:  I was discharged from behavioral [facility].  I was depressed.  I still have a lot of anxiety and irritability.  History of Present Illness:  Patient is 44 year old African-American divorced currently unemployed male who was referred from inpatient psychiatric services.  Patient was admitted twice in recent months due to worsening of depression, having suicidal thoughts and relapse into drinking.  His major stressors are relationship issues and dealing with his anger and depression.  Though he also admitted drinking but he minimizes his drinking.  Upon admission his blood alcohol level was more than 100 patient told he has long history of irritability and anger issues and lately he is not doing very well accepting his problems.  He was admitted in April and then again in May for severe depression.  Though he denies any suicidal attempt but admitted his depression is so severe that he sometimes cannot contract for safety.  Patient was discharged on Neurontin, Vistaril, trazodone.  He had tried Lexapro and Valium in the past.  He supposed to see Peterson Lombard but he has not seen due to financial problems.  Lately patient is complaining of poor sleep, irritability, anger, mood swings and agitation.  He admitted when he goes to depression he becomes isolated, withdrawn and detached from the family members.  Patient is very close to his children.  He has 5 children's.  He has son from his previous relationship and he has 3 kids from his ex-wife and he has another 75-year-old daughter from his girlfriend.  However he is currently separated.  He is living with his father.  He admitted that he has severe mood swing and some time he is very happy and then crash into severe depression.  Though he denies any paranoia, hallucination, aggressive behavior but admitted his anger issues has caused  him a relationship problem and currently he is not working because he does not get along very well with this coworker.  Patient works in [employer].  He feels some time hopeless, helpless and worthless.  He admitted decreased energy, fatigue and insomnia.  He has excessive guilt about that his past because he is unable to keep the relationship.  Patient denies any OCD symptoms, PTSD symptoms, panic attacks or any aggressive behavior.  He claims to be sober from drinking since he left the hospital but again he isn't minimizes his symptoms.  Patient denies any intravenous drug use.  Suicidal Ideation: No Plan Formed: No Patient has means to carry out plan: No  Homicidal Ideation: No Plan Formed: No Patient has means to carry out plan: No  Past Psychiatric History/Hospitalization; Patient has at least 3 psychiatric hospitalization which is usually complicated by alcohol use, relationship issues and severe depression.  Though he has no suicidal attempt but admitted suicidal thoughts and plan when he is severely depressed.  He had tried Lexapro, Valium in the past.  He has been not seeing psychiatrist regularly.  Patient denies any history of physical or sexual abuse in the past.  He has been arrested for drug charges and [id number].  Currently he is not in any probation. Anxiety: Yes Bipolar Disorder: No Depression: Yes Mania: No Psychosis: No Schizophrenia: No Personality Disorder: No Hospitalization for psychiatric illness: Yes History of Electroconvulsive Shock Therapy: No Prior Suicide Attempts: No  Medical History; Patient has hypertension, coronary artery disease and status post stent .  Traumatic brain injury: Patient denies any history of traumatic brain injury.   Social History; Patient is raised by his mother and stepfather.  He has not seen his bicycle father in many years.  His mother died when he was 38 years old.  He has 5 children.  He has 16 year old son with his first  relationship than he has 3 children age 30, 82 and 96 from his ex-wife and he has 60-year-old from his last relationship.  Currently patient is living with his stepfather.  He has a brother who lives in [place].    Education and Work History; Patient never finished college.  He is working in [employer] and currently he is unemployed and not ready to go back to work.    Family History; Patient is unknown if his family member has any depression or psychiatric illness.    Legal History; Patient was arrested and incarcerated for 7 months due to drug charges.  Currently he is not in any probation.   History Of Abuse; Patient denies any history of physical, sexual or verbal abuse.    Substance Abuse History; Patient admitted history of drinking alcohol, smoking marijuana and using cocaine in the past.  He claims to be sober from marijuana and cocaine but admitted still drinking on and off.  He denies any binge and this time he does not have any withdrawal symptoms.   Review of Systems: Psychiatric: Agitation: Yes Hallucination: No Depressed Mood: Yes Insomnia: Yes Hypersomnia: No Altered Concentration: No Feels Worthless: No Grandiose Ideas: No Belief In Special Powers: No New/Increased Substance Abuse: Yes Compulsions: No  Neurologic: Headache: No Seizure: No Paresthesias: No   Outpatient Encounter Prescriptions as of 08/06/2014  Medication Sig  . aspirin 81 MG EC tablet Take 1 tablet (81 mg total) by mouth daily. For health health  . atorvastatin (LIPITOR) 80 MG tablet Take 1 tablet (80 mg total) by mouth daily at 6 PM. For high cholesterol  . clopidogrel (PLAVIX) 75 MG tablet Take 1 tablet (75 mg total) by mouth daily. For prevention of blood clot  . hydrochlorothiazide (MICROZIDE) 12.5 MG capsule Take 1 capsule (12.5 mg total) by mouth daily. For high blood pressure  . hydrOXYzine (ATARAX/VISTARIL) 25 MG tablet Take 1 tablet (25 mg total) by mouth 3 (three) times daily  with meals as needed for anxiety.  . lamoTRIgine (LAMICTAL) 25 MG tablet Take 1 tab daily for 1 week and than 2 tab daily  . metoprolol tartrate (LOPRESSOR) 25 MG tablet Take 0.5 tablets (12.5 mg total) by mouth 2 (two) times daily. For high blood pressure  . nitroGLYCERIN (NITROSTAT) 0.4 MG SL tablet Place 1 tablet (0.4 mg total) under the tongue every 5 (five) minutes x 3 doses as needed for chest pain.  . pantoprazole (PROTONIX) 40 MG tablet Take 1 tablet (40 mg total) by mouth daily. For acid reflux  . traZODone (DESYREL) 50 MG tablet Take 1 tablet (50 mg total) by mouth at bedtime as needed for sleep.  . [DISCONTINUED] gabapentin (NEURONTIN) 100 MG capsule Take 1 capsule (100 mg total) by mouth 3 (three) times daily. For substance withdrawal syndrome  . [DISCONTINUED] hydrOXYzine (ATARAX/VISTARIL) 25 MG tablet Take 1 tablet (25 mg total) by mouth every 6 (six) hours as needed for anxiety.  . [DISCONTINUED] metoprolol tartrate (LOPRESSOR) 25 MG tablet Take 0.5 tablets (12.5 mg total) by mouth 2 (two) times daily.  . [DISCONTINUED] traZODone (DESYREL) 50 MG tablet Take 1 tablet (50 mg total) by mouth at  bedtime as needed for sleep.   No facility-administered encounter medications on file as of 08/06/2014.    Recent Results (from the past 2160 hour(s))  CBC WITH DIFFERENTIAL     Status: None   Collection Time: 06/01/14  3:53 PM  Result Value Ref Range   WBC 7.3 4.0 - 10.5 K/uL   RBC 4.92 4.22 - 5.81 MIL/uL   Hemoglobin 14.4 13.0 - 17.0 g/dL   HCT 41.7 39.0 - 52.0 %   MCV 84.8 78.0 - 100.0 fL   MCH 29.3 26.0 - 34.0 pg   MCHC 34.5 30.0 - 36.0 g/dL   RDW 15.1 11.5 - 15.5 %   Platelets 295 150 - 400 K/uL   Neutrophils Relative % 67 43 - 77 %   Neutro Abs 5.0 1.7 - 7.7 K/uL   Lymphocytes Relative 21 12 - 46 %   Lymphs Abs 1.5 0.7 - 4.0 K/uL   Monocytes Relative 9 3 - 12 %   Monocytes Absolute 0.6 0.1 - 1.0 K/uL   Eosinophils Relative 2 0 - 5 %   Eosinophils Absolute 0.1 0.0 - 0.7 K/uL    Basophils Relative 1 0 - 1 %   Basophils Absolute 0.0 0.0 - 0.1 K/uL  Comprehensive metabolic panel     Status: Abnormal   Collection Time: 06/01/14  3:53 PM  Result Value Ref Range   Sodium 134 (L) 135 - 145 mmol/L   Potassium 3.5 3.5 - 5.1 mmol/L   Chloride 96 96 - 112 mmol/L   CO2 27 19 - 32 mmol/L   Glucose, Bld 124 (H) 70 - 99 mg/dL   BUN 10 6 - 23 mg/dL   Creatinine, Ser 1.05 0.50 - 1.35 mg/dL   Calcium 9.2 8.4 - 10.5 mg/dL   Total Protein 7.4 6.0 - 8.3 g/dL   Albumin 4.0 3.5 - 5.2 g/dL   AST 38 (H) 0 - 37 U/L   ALT 26 0 - 53 U/L   Alkaline Phosphatase 74 39 - 117 U/L   Total Bilirubin 1.0 0.3 - 1.2 mg/dL   GFR calc non Af Amer 85 (L) >90 mL/min   GFR calc Af Amer >90 >90 mL/min    Comment: (NOTE) The eGFR has been calculated using the CKD EPI equation. This calculation has not been validated in all clinical situations. eGFR's persistently <90 mL/min signify possible Chronic Kidney Disease.    Anion gap 11 5 - 15  Ethanol     Status: None   Collection Time: 06/01/14  3:53 PM  Result Value Ref Range   Alcohol, Ethyl (B) <5 0 - 9 mg/dL    Comment:        LOWEST DETECTABLE LIMIT FOR SERUM ALCOHOL IS 11 mg/dL FOR MEDICAL PURPOSES ONLY   Drug screen panel, emergency     Status: Abnormal   Collection Time: 06/01/14  8:34 PM  Result Value Ref Range   Opiates NONE DETECTED NONE DETECTED   Cocaine NONE DETECTED NONE DETECTED   Benzodiazepines POSITIVE (A) NONE DETECTED   Amphetamines NONE DETECTED NONE DETECTED   Tetrahydrocannabinol POSITIVE (A) NONE DETECTED   Barbiturates NONE DETECTED NONE DETECTED    Comment:        DRUG SCREEN FOR MEDICAL PURPOSES ONLY.  IF CONFIRMATION IS NEEDED FOR ANY PURPOSE, NOTIFY LAB WITHIN 5 DAYS.        LOWEST DETECTABLE LIMITS FOR URINE DRUG SCREEN Drug Class       Cutoff (ng/mL) Amphetamine  1000 Barbiturate      200 Benzodiazepine   683 Tricyclics       419 Opiates          300 Cocaine          300 THC              50    I-Stat Troponin, ED (not at [facility])     Status: None   Collection Time: 06/01/14 10:16 PM  Result Value Ref Range   Troponin i, poc 0.01 0.00 - 0.08 ng/mL   Comment 3            Comment: Due to the release kinetics of cTnI, a negative result within the first hours of the onset of symptoms does not rule out myocardial infarction with certainty. If myocardial infarction is still suspected, repeat the test at appropriate intervals.   GC/Chlamydia probe amp ([place])     Status: None   Collection Time: 06/02/14 12:00 AM  Result Value Ref Range   Chlamydia Negative     Comment: Normal Reference Range - Negative   Neisseria gonorrhea Negative     Comment: Normal Reference Range - Negative  HIV antibody     Status: None   Collection Time: 06/02/14  7:25 PM  Result Value Ref Range   HIV Screen 4th Generation wRfx Non Reactive Non Reactive    Comment: (NOTE) Performed At: [facility] [place], [place] [id number] Lindon Romp MD [id number] Performed at [facility]   RPR     Status: None   Collection Time: 06/02/14  7:25 PM  Result Value Ref Range   RPR Ser Ql Non Reactive Non Reactive    Comment: (NOTE) Performed At: [facility] [place], [place] [id number] Lindon Romp MD [id number] Performed at [facility]   Acetaminophen level     Status: Abnormal   Collection Time: 06/15/14 11:48 PM  Result Value Ref Range   Acetaminophen (Tylenol), Serum <10.0 (L) 10 - 30 ug/mL    Comment:        THERAPEUTIC CONCENTRATIONS VARY SIGNIFICANTLY. A RANGE OF 10-30 ug/mL MAY BE AN EFFECTIVE CONCENTRATION FOR MANY PATIENTS. HOWEVER, SOME ARE BEST TREATED AT CONCENTRATIONS OUTSIDE THIS RANGE. ACETAMINOPHEN CONCENTRATIONS >150 ug/mL AT 4 HOURS AFTER INGESTION AND >50 ug/mL AT 12 HOURS AFTER INGESTION ARE OFTEN ASSOCIATED WITH TOXIC REACTIONS.   CBC     Status: None   Collection Time:  06/15/14 11:48 PM  Result Value Ref Range   WBC 9.3 4.0 - 10.5 K/uL   RBC 4.90 4.22 - 5.81 MIL/uL   Hemoglobin 14.3 13.0 - 17.0 g/dL   HCT 42.0 39.0 - 52.0 %   MCV 85.7 78.0 - 100.0 fL   MCH 29.2 26.0 - 34.0 pg   MCHC 34.0 30.0 - 36.0 g/dL   RDW 15.3 11.5 - 15.5 %   Platelets 263 150 - 400 K/uL  Comprehensive metabolic panel     Status: Abnormal   Collection Time: 06/15/14 11:48 PM  Result Value Ref Range   Sodium 129 (L) 135 - 145 mmol/L   Potassium 2.9 (L) 3.5 - 5.1 mmol/L   Chloride 98 96 - 112 mmol/L   CO2 23 19 - 32 mmol/L   Glucose, Bld 116 (H) 70 - 99 mg/dL   BUN 11 6 - 23 mg/dL   Creatinine, Ser 0.92 0.50 - 1.35 mg/dL   Calcium 8.4 8.4 - 10.5 mg/dL  Total Protein 8.0 6.0 - 8.3 g/dL   Albumin 4.4 3.5 - 5.2 g/dL   AST 32 0 - 37 U/L   ALT 32 0 - 53 U/L   Alkaline Phosphatase 73 39 - 117 U/L   Total Bilirubin 1.0 0.3 - 1.2 mg/dL   GFR calc non Af Amer >90 >90 mL/min   GFR calc Af Amer >90 >90 mL/min    Comment: (NOTE) The eGFR has been calculated using the CKD EPI equation. This calculation has not been validated in all clinical situations. eGFR's persistently <90 mL/min signify possible Chronic Kidney Disease.    Anion gap 8 5 - 15  Ethanol (ETOH)     Status: Abnormal   Collection Time: 06/15/14 11:48 PM  Result Value Ref Range   Alcohol, Ethyl (B) 151 (H) 0 - 9 mg/dL    Comment:        LOWEST DETECTABLE LIMIT FOR SERUM ALCOHOL IS 11 mg/dL FOR MEDICAL PURPOSES ONLY   Salicylate level     Status: None   Collection Time: 06/15/14 11:48 PM  Result Value Ref Range   Salicylate Lvl <5.3 2.8 - 20.0 mg/dL  Basic metabolic panel     Status: Abnormal   Collection Time: 06/16/14  4:58 AM  Result Value Ref Range   Sodium 135 135 - 145 mmol/L   Potassium 3.4 (L) 3.5 - 5.1 mmol/L   Chloride 102 96 - 112 mmol/L   CO2 27 19 - 32 mmol/L   Glucose, Bld 109 (H) 70 - 99 mg/dL   BUN 14 6 - 23 mg/dL   Creatinine, Ser 1.14 0.50 - 1.35 mg/dL   Calcium 8.5 8.4 - 10.5 mg/dL    GFR calc non Af Amer 77 (L) >90 mL/min   GFR calc Af Amer 89 (L) >90 mL/min    Comment: (NOTE) The eGFR has been calculated using the CKD EPI equation. This calculation has not been validated in all clinical situations. eGFR's persistently <90 mL/min signify possible Chronic Kidney Disease.    Anion gap 6 5 - 15  Urine Drug Screen     Status: Abnormal   Collection Time: 06/16/14  7:35 AM  Result Value Ref Range   Opiates NONE DETECTED NONE DETECTED   Cocaine NONE DETECTED NONE DETECTED   Benzodiazepines POSITIVE (A) NONE DETECTED   Amphetamines NONE DETECTED NONE DETECTED   Tetrahydrocannabinol NONE DETECTED NONE DETECTED   Barbiturates NONE DETECTED NONE DETECTED    Comment:        DRUG SCREEN FOR MEDICAL PURPOSES ONLY.  IF CONFIRMATION IS NEEDED FOR ANY PURPOSE, NOTIFY LAB WITHIN 5 DAYS.        LOWEST DETECTABLE LIMITS FOR URINE DRUG SCREEN Drug Class       Cutoff (ng/mL) Amphetamine      1000 Barbiturate      200 Benzodiazepine   646 Tricyclics       803 Opiates          300 Cocaine          300 THC              50       Constitutional:  BP 126/70 mmHg  Pulse 65  Ht 5' 7" (1.702 m)  Wt 168 lb 3.2 oz (76.295 kg)  BMI 26.34 kg/m2   Musculoskeletal: Strength & Muscle Tone: within normal limits Gait & Station: normal Patient leans: Backward  Psychiatric Specialty Exam: General Appearance: Casual  Eye Contact::  Fair  Speech:  Slow  Volume:  Decreased  Mood:  Anxious, Depressed and Dysphoric  Affect:  Constricted and Depressed  Thought Process:  Coherent and Linear  Orientation:  Full (Time, Place, and Person)  Thought Content:  Rumination  Suicidal Thoughts:  No  Homicidal Thoughts:  No  Memory:  Immediate;   Fair Recent;   Fair Remote;   Fair  Judgement:  Fair  Insight:  Fair  Psychomotor Activity:  Decreased  Concentration:  Fair  Recall:  [employer] of Knowledge:  Fair  Language:  Fair  Akathisia:  No  Handed:  Right  AIMS (if  indicated):     Assets:  Communication Skills Desire for Improvement Housing Social Support  ADL's:  Intact  Cognition:  WNL  Sleep:        New problem, with additional work up planned, Review of Psycho-Social Stressors (1), Review or order clinical lab tests (1), Decision to obtain old records (1), Review and summation of old records (2), Established Problem, Worsening (2), New Problem, with no additional work-up planned (3), Review of Medication Regimen & Side Effects (2) and Review of New Medication or Change in Dosage (2)  Assessment: Axis Alcohol dependence, Major depressive disorder, recurrent.  Rule out bipolar disorder depressed type  Axis II: Deferred   Axis III:  Past Medical History  Diagnosis Date  . Hypertension   . Tobacco abuse   . Coronary artery disease 03/24/2014    SINGLE VESSEL CAD  . Anxiety   . Depression      Plan:  I review his symptoms, history, psychosocial stressors, current medication and recent blood work results and discharge summary.  Patient minimizes his alcohol intake.  He has not seen his therapist Peterson Lombard on a regular basis.  He still have symptoms of irritability, anger and mood swings.  I recommended to try Lamictal 25 mg daily for 1 week and then gradually increase to 50 mg daily.  For now I will continue Vistaril 25 mg 3 times a day as needed for anxiety and trazodone 50 mg at bedtime.  I encourage him to go [place] meetings .  We discuss a lot of time talking about alcohol interaction with the psycho debate medication and his illness.  Patient is planning to move out to live by himself because he believe his surroundings does not help him to stop drinking.  I recommended to call us back if he has any question or any concern.  We discuss safety concern that anytime having active suicidal thoughts or homicidal thoughts and he need to call 911 or go to the local emergency room.  Discussed sleep hygiene and weight maintenance.  Discussed medication  side effects especially Lamictal if cause a rash that he need to call us immediately.  I would discontinue Neurontin which was given in the hospital.  Encouraged to keep appointment with his cardiologist for the management of antihypertensives medication.  Time spent 55 minutes.  Follow-up in 3-4 weeks.    ARFEEN,SYED T., MD 08/06/2014

## 2014-08-13 ENCOUNTER — Ambulatory Visit: Payer: BLUE CROSS/BLUE SHIELD | Admitting: Cardiology

## 2014-09-23 ENCOUNTER — Ambulatory Visit (HOSPITAL_COMMUNITY): Payer: Self-pay | Admitting: Psychiatry

## 2014-10-12 ENCOUNTER — Encounter: Payer: Self-pay | Admitting: Cardiology

## 2014-10-20 ENCOUNTER — Encounter (HOSPITAL_COMMUNITY): Payer: Self-pay | Admitting: Emergency Medicine

## 2014-10-20 ENCOUNTER — Emergency Department (HOSPITAL_COMMUNITY)
Admission: EM | Admit: 2014-10-20 | Discharge: 2014-10-20 | Disposition: A | Discharge status: Home / Self Care | Payer: BLUE CROSS/BLUE SHIELD | Attending: Physician Assistant | Admitting: Physician Assistant

## 2014-10-20 ENCOUNTER — Emergency Department (HOSPITAL_COMMUNITY): Payer: BLUE CROSS/BLUE SHIELD

## 2014-10-20 DIAGNOSIS — I10 Essential (primary) hypertension: Secondary | ICD-10-CM | POA: Insufficient documentation

## 2014-10-20 DIAGNOSIS — Z79899 Other long term (current) drug therapy: Secondary | ICD-10-CM | POA: Insufficient documentation

## 2014-10-20 DIAGNOSIS — Z7902 Long term (current) use of antithrombotics/antiplatelets: Secondary | ICD-10-CM | POA: Insufficient documentation

## 2014-10-20 DIAGNOSIS — Z720 Tobacco use: Secondary | ICD-10-CM | POA: Insufficient documentation

## 2014-10-20 DIAGNOSIS — F329 Major depressive disorder, single episode, unspecified: Secondary | ICD-10-CM | POA: Insufficient documentation

## 2014-10-20 DIAGNOSIS — Z7982 Long term (current) use of aspirin: Secondary | ICD-10-CM | POA: Diagnosis not present

## 2014-10-20 DIAGNOSIS — Z9889 Other specified postprocedural states: Secondary | ICD-10-CM | POA: Diagnosis not present

## 2014-10-20 DIAGNOSIS — R12 Heartburn: Secondary | ICD-10-CM | POA: Diagnosis not present

## 2014-10-20 DIAGNOSIS — F439 Reaction to severe stress, unspecified: Secondary | ICD-10-CM | POA: Insufficient documentation

## 2014-10-20 DIAGNOSIS — R110 Nausea: Secondary | ICD-10-CM | POA: Diagnosis not present

## 2014-10-20 DIAGNOSIS — Z566 Other physical and mental strain related to work: Secondary | ICD-10-CM

## 2014-10-20 DIAGNOSIS — F419 Anxiety disorder, unspecified: Secondary | ICD-10-CM

## 2014-10-20 DIAGNOSIS — I2510 Atherosclerotic heart disease of native coronary artery without angina pectoris: Secondary | ICD-10-CM | POA: Insufficient documentation

## 2014-10-20 LAB — CBC
HEMATOCRIT: 40 % (ref 39.0–52.0)
HEMOGLOBIN: 13.2 g/dL (ref 13.0–17.0)
MCH: 28.4 pg (ref 26.0–34.0)
MCHC: 33 g/dL (ref 30.0–36.0)
MCV: 86 fL (ref 78.0–100.0)
Platelets: 285 10*3/uL (ref 150–400)
RBC: 4.65 MIL/uL (ref 4.22–5.81)
RDW: 15.9 % — ABNORMAL HIGH (ref 11.5–15.5)
WBC: 6.9 10*3/uL (ref 4.0–10.5)

## 2014-10-20 LAB — I-STAT TROPONIN, ED: TROPONIN I, POC: 0 ng/mL (ref 0.00–0.08)

## 2014-10-20 LAB — BASIC METABOLIC PANEL
Anion gap: 6 (ref 5–15)
BUN: 16 mg/dL (ref 6–20)
CALCIUM: 8.7 mg/dL — AB (ref 8.9–10.3)
CO2: 24 mmol/L (ref 22–32)
CREATININE: 1.18 mg/dL (ref 0.61–1.24)
Chloride: 106 mmol/L (ref 101–111)
Glucose, Bld: 96 mg/dL (ref 65–99)
Potassium: 3.7 mmol/L (ref 3.5–5.1)
SODIUM: 136 mmol/L (ref 135–145)

## 2014-10-20 MED ORDER — LORAZEPAM 1 MG PO TABS
1.0000 mg | ORAL_TABLET | Freq: Once | ORAL | Status: AC
  Administered 2014-10-20: 1 mg via ORAL
  Filled 2014-10-20: qty 1

## 2014-10-20 MED ORDER — LORAZEPAM 1 MG PO TABS: 1.0000 mg | ORAL_TABLET | Freq: Four times a day (QID) | ORAL | Status: DC | PRN

## 2014-10-20 MED ORDER — GI COCKTAIL ~~LOC~~
30.0000 mL | Freq: Once | ORAL | Status: AC
  Administered 2014-10-20: 30 mL via ORAL
  Filled 2014-10-20: qty 30

## 2014-10-20 NOTE — Discharge Instructions (Signed)
Take Ativan as needed as directed for anxiety. No driving or operating heavy machinery while taking ativan. This medication may cause drowsiness. Watch the foods that you eat and keep a log of which foods make your symptoms worse.  Heartburn Heartburn is a painful, burning sensation in the chest. It may feel worse in certain positions, such as lying down or bending over. It is caused by stomach acid backing up into the tube that carries food from the mouth down to the stomach (lower esophagus).  CAUSES   Large meals.  Certain foods and drinks.  Exercise.  Increased acid production.  Being overweight or obese.  Certain medicines. SYMPTOMS   Burning pain in the chest or lower throat.  Bitter taste in the mouth.  Coughing. DIAGNOSIS  If the usual treatments for heartburn do not improve your symptoms, then tests may be done to see if there is another condition present. Possible tests may include:  X-rays.  Endoscopy. This is when a tube with a light and a camera on the end is used to examine the esophagus and the stomach.  A test to measure the amount of acid in the esophagus (pH test).  A test to see if the esophagus is working properly (esophageal manometry).  Blood, breath, or stool tests to check for bacteria that cause ulcers. TREATMENT   Your caregiver may tell you to use certain over-the-counter medicines (antacids, acid reducers) for mild heartburn.  Your caregiver may prescribe medicines to decrease the acid in your stomach or protect your stomach lining.  Your caregiver may recommend certain diet changes.  For severe cases, your caregiver may recommend that the head of your bed be elevated on blocks. (Sleeping with more pillows is not an effective treatment as it only changes the position of your head and does not improve the main problem of stomach acid refluxing into the esophagus.) HOME CARE INSTRUCTIONS   Take all medicines as directed by your  caregiver.  Raise the head of your bed by putting blocks under the legs if instructed to by your caregiver.  Do not exercise right after eating.  Avoid eating 2 or 3 hours before bed. Do not lie down right after eating.  Eat small meals throughout the day instead of 3 large meals.  Stop smoking if you smoke.  Maintain a healthy weight.  Identify foods and beverages that make your symptoms worse and avoid them. Foods you may want to avoid include:  Peppers.  Chocolate.  High-fat foods, including fried foods.  Spicy foods.  Garlic and onions.  Citrus fruits, including oranges, grapefruit, lemons, and limes.  Food containing tomatoes or tomato products.  Mint.  Carbonated drinks, caffeinated drinks, and alcohol.  Vinegar. SEEK IMMEDIATE MEDICAL CARE IF:  You have severe chest pain that goes down your arm or into your jaw or neck.  You feel sweaty, dizzy, or lightheaded.  You are short of breath.  You vomit blood.  You have difficulty or pain with swallowing.  You have bloody or black, tarry stools.  You have episodes of heartburn more than 3 times a week for more than 2 weeks. MAKE SURE YOU:  Understand these instructions.  Will watch your condition.  Will get help right away if you are not doing well or get worse. Document Released: 07/02/2008 Document Revised: 05/08/2011 Document Reviewed: 07/31/2010 [facility] Patient Information 2015 [place], [place]. This information is not intended to replace advice given to you by your health care provider. Make sure you discuss  any questions you have with your health care provider.  Stress Stress-related medical problems are becoming increasingly common. The body has a built-in physical response to stressful situations. Faced with pressure, challenge or danger, we need to react quickly. Our bodies release hormones such as cortisol and adrenaline to help do this. These hormones are part of the "fight or flight" response  and affect the metabolic rate, heart rate and blood pressure, resulting in a heightened, stressed state that prepares the body for optimum performance in dealing with a stressful situation. It is likely that early man required these mechanisms to stay alive, but usually modern stresses do not call for this, and the same hormones released in today's world can damage health and reduce coping ability. CAUSES  Pressure to perform at work, at school or in sports.  Threats of physical violence.  Money worries.  Arguments.  Family conflicts.  Divorce or separation from significant other.  Bereavement.  New job or unemployment.  Changes in location.  Alcohol or drug abuse. SOMETIMES, THERE IS NO PARTICULAR REASON FOR DEVELOPING STRESS. Almost all people are at risk of being stressed at some time in their lives. It is important to know that some stress is temporary and some is long term.  Temporary stress will go away when a situation is resolved. Most people can cope with short periods of stress, and it can often be relieved by relaxing, taking a walk or getting any type of exercise, chatting through issues with friends, or having a good night's sleep.  Chronic (long-term, continuous) stress is much harder to deal with. It can be psychologically and emotionally damaging. It can be harmful both for an individual and for friends and family. SYMPTOMS Everyone reacts to stress differently. There are some common effects that help [place] recognize it. In times of extreme stress, people may:  Shake uncontrollably.  Breathe faster and deeper than normal (hyperventilate).  Vomit.  For people with asthma, stress can trigger an attack.  For some people, stress may trigger migraine headaches, ulcers, and body pain. PHYSICAL EFFECTS OF STRESS MAY INCLUDE:  Loss of energy.  Skin problems.  Aches and pains resulting from tense muscles, including neck ache, backache and tension  headaches.  Increased pain from arthritis and other conditions.  Irregular heart beat (palpitations).  Periods of irritability or anger.  Apathy or depression.  Anxiety (feeling uptight or worrying).  Unusual behavior.  Loss of appetite.  Comfort eating.  Lack of concentration.  Loss of, or decreased, sex-drive.  Increased smoking, drinking, or recreational drug use.  For women, missed periods.  Ulcers, joint pain, and muscle pain. Post-traumatic stress is the stress caused by any serious accident, strong emotional damage, or extremely difficult or violent experience such as rape or war. Post-traumatic stress victims can experience mixtures of emotions such as fear, shame, depression, guilt or anger. It may include recurrent memories or images that may be haunting. These feelings can last for weeks, months or even years after the traumatic event that triggered them. Specialized treatment, possibly with medicines and psychological therapies, is available. If stress is causing physical symptoms, severe distress or making it difficult for you to function as normal, it is worth seeing your caregiver. It is important to remember that although stress is a usual part of life, extreme or prolonged stress can lead to other illnesses that will need treatment. It is better to visit a doctor sooner rather than later. Stress has been linked to the development of high  blood pressure and heart disease, as well as insomnia and depression. There is no diagnostic test for stress since everyone reacts to it differently. But a caregiver will be able to spot the physical symptoms, such as:  Headaches.  Shingles.  Ulcers. Emotional distress such as intense worry, low mood or irritability should be detected when the doctor asks pertinent questions to identify any underlying problems that might be the cause. In case there are physical reasons for the symptoms, the doctor may also want to do some tests  to exclude certain conditions. If you feel that you are suffering from stress, try to identify the aspects of your life that are causing it. Sometimes you may not be able to change or avoid them, but even a small change can have a positive ripple effect. A simple lifestyle change can make all the difference. STRATEGIES THAT CAN HELP DEAL WITH STRESS:  Delegating or sharing responsibilities.  Avoiding confrontations.  Learning to be more assertive.  Regular exercise.  Avoid using alcohol or street drugs to cope.  Eating a healthy, balanced diet, rich in fruit and vegetables and proteins.  Finding humor or absurdity in stressful situations.  Never taking on more than you know you can handle comfortably.  Organizing your time better to get as much done as possible.  Talking to friends or family and sharing your thoughts and fears.  Listening to music or relaxation tapes.  Relaxation techniques like deep breathing, meditation, and yoga.  Tensing and then relaxing your muscles, starting at the toes and working up to the head and neck. If you think that you would benefit from help, either in identifying the things that are causing your stress or in learning techniques to help you relax, see a caregiver who is capable of helping you with this. Rather than relying on medications, it is usually better to try and identify the things in your life that are causing stress and try to deal with them. There are many techniques of managing stress including counseling, psychotherapy, aromatherapy, yoga, and exercise. Your caregiver can help you determine what is best for you. Document Released: 05/06/2002 Document Revised: 02/18/2013 Document Reviewed: 04/02/2007 [facility] Patient Information 2015 [place], [place]. This information is not intended to replace advice given to you by your health care provider. Make sure you discuss any questions you have with your health care provider.  Panic  Attacks Panic attacks are sudden, short-livedsurges of severe anxiety, fear, or discomfort. They may occur for no reason when you are relaxed, when you are anxious, or when you are sleeping. Panic attacks may occur for a number of reasons:   Healthy people occasionally have panic attacks in extreme, life-threatening situations, such as war or natural disasters. Normal anxiety is a protective mechanism of the body that helps [place] react to danger (fight or flight response).  Panic attacks are often seen with anxiety disorders, such as panic disorder, social anxiety disorder, generalized anxiety disorder, and phobias. Anxiety disorders cause excessive or uncontrollable anxiety. They may interfere with your relationships or other life activities.  Panic attacks are sometimes seen with other mental illnesses, such as depression and posttraumatic stress disorder.  Certain medical conditions, prescription medicines, and drugs of abuse can cause panic attacks. SYMPTOMS  Panic attacks start suddenly, peak within 20 minutes, and are accompanied by four or more of the following symptoms:  Pounding heart or fast heart rate (palpitations).  Sweating.  Trembling or shaking.  Shortness of breath or feeling smothered.  Feeling  choked.  Chest pain or discomfort.  Nausea or strange feeling in your stomach.  Dizziness, light-headedness, or feeling like you will faint.  Chills or hot flushes.  Numbness or tingling in your lips or hands and feet.  Feeling that things are not real or feeling that you are not yourself.  Fear of losing control or going crazy.  Fear of dying. Some of these symptoms can mimic serious medical conditions. For example, you may think you are having a heart attack. Although panic attacks can be very scary, they are not life threatening. DIAGNOSIS  Panic attacks are diagnosed through an assessment by your health care provider. Your health care provider will ask questions  about your symptoms, such as where and when they occurred. Your health care provider will also ask about your medical history and use of alcohol and drugs, including prescription medicines. Your health care provider may order blood tests or other studies to rule out a serious medical condition. Your health care provider may refer you to a mental health professional for further evaluation. TREATMENT   Most healthy people who have one or two panic attacks in an extreme, life-threatening situation will not require treatment.  The treatment for panic attacks associated with anxiety disorders or other mental illness typically involves counseling with a mental health professional, medicine, or a combination of both. Your health care provider will help determine what treatment is best for you.  Panic attacks due to physical illness usually go away with treatment of the illness. If prescription medicine is causing panic attacks, talk with your health care provider about stopping the medicine, decreasing the dose, or substituting another medicine.  Panic attacks due to alcohol or drug abuse go away with abstinence. Some adults need professional help in order to stop drinking or using drugs. HOME CARE INSTRUCTIONS   Take all medicines as directed by your health care provider.   Schedule and attend follow-up visits as directed by your health care provider. It is important to keep all your appointments. SEEK MEDICAL CARE IF:  You are not able to take your medicines as prescribed.  Your symptoms do not improve or get worse. SEEK IMMEDIATE MEDICAL CARE IF:   You experience panic attack symptoms that are different than your usual symptoms.  You have serious thoughts about hurting yourself or others.  You are taking medicine for panic attacks and have a serious side effect. MAKE SURE YOU:  Understand these instructions.  Will watch your condition.  Will get help right away if you are not doing well  or get worse. Document Released: 02/13/2005 Document Revised: 02/18/2013 Document Reviewed: 09/27/2012 [facility] Patient Information 2015 [place], [place]. This information is not intended to replace advice given to you by your health care provider. Make sure you discuss any questions you have with your health care provider. Food Choices for Gastroesophageal Reflux Disease When you have gastroesophageal reflux disease (GERD), the foods you eat and your eating habits are very important. Choosing the right foods can help ease the discomfort of GERD. WHAT GENERAL GUIDELINES DO I NEED TO FOLLOW?  Choose fruits, vegetables, whole grains, low-fat dairy products, and low-fat meat, fish, and poultry.  Limit fats such as oils, salad dressings, butter, nuts, and avocado.  Keep a food diary to identify foods that cause symptoms.  Avoid foods that cause reflux. These may be different for different people.  Eat frequent small meals instead of three large meals each day.  Eat your meals slowly, in a relaxed  setting.  Limit fried foods.  Cook foods using methods other than frying.  Avoid drinking alcohol.  Avoid drinking large amounts of liquids with your meals.  Avoid bending over or lying down until 2-3 hours after eating. WHAT FOODS ARE NOT RECOMMENDED? The following are some foods and drinks that may worsen your symptoms: Vegetables Tomatoes. Tomato juice. Tomato and spaghetti sauce. Chili peppers. Onion and garlic. Horseradish. Fruits Oranges, grapefruit, and lemon (fruit and juice). Meats High-fat meats, fish, and poultry. This includes hot dogs, ribs, ham, sausage, salami, and bacon. Dairy Whole milk and chocolate milk. Sour cream. Cream. Butter. Ice cream. Cream cheese.  Beverages Coffee and tea, with or without caffeine. Carbonated beverages or energy drinks. Condiments Hot sauce. Barbecue sauce.  Sweets/Desserts Chocolate and cocoa. Donuts. Peppermint and spearmint. Fats and  Oils High-fat foods, including [place] fries and potato chips. Other Vinegar. Strong spices, such as black pepper, white pepper, red pepper, cayenne, curry powder, cloves, ginger, and chili powder. The items listed above may not be a complete list of foods and beverages to avoid. Contact your dietitian for more information. Document Released: 02/13/2005 Document Revised: 02/18/2013 Document Reviewed: 12/18/2012 [facility] Patient Information 2015 [place], [place]. This information is not intended to replace advice given to you by your health care provider. Make sure you discuss any questions you have with your health care provider.

## 2014-10-20 NOTE — ED Notes (Signed)
Pt states he has a burning sensation in his throat down to his stomach  Pt states it has been going on for a month or so   Pt states it comes and goes depending on what he has been eating  Pt states it is worse at night  Pt states he tried OTC Zantac without relief

## 2014-10-20 NOTE — ED Provider Notes (Signed)
CSN: [id number]     Arrival date & time 10/20/14  1934 History   First MD Initiated Contact with Patient 10/20/14 2006     Chief Complaint  Patient presents with  . Gastrophageal Reflux     (Consider location/radiation/quality/duration/timing/severity/associated sxs/prior Treatment) HPI Comments: 44 year old male with a past medical history of hypertension, CAD (s/p L heart cath 02/2014 stent in LAD), anxiety and depression presenting with concerns of heartburn for the past month. Reports a burning sensation in his throat that radiates down into his stomach after anytime he eats or drinks. This evening he had [employer] which was a little spicy and caused burning in his throat radiating into the stomach. Nothing in specific today is new causing him to come to the emergency department. Tried Zantac in the past with no relief. Symptoms usually worse at night. States it is making him anxious as he had a cardiac stent placed in January. Denies chest pain, shortness of breath. Admits to occasional nausea without vomiting. No difficulty swallowing. States he is under a ton of stress right now at work and needs a vacation.  The history is provided by the patient.    Past Medical History  Diagnosis Date  . Hypertension   . Tobacco abuse   . Coronary artery disease 03/24/2014    SINGLE VESSEL CAD  . Anxiety   . Depression    Past Surgical History  Procedure Laterality Date  . Left heart catheterization with coronary angiogram Bilateral 03/04/2014    Procedure: LEFT HEART CATHETERIZATION WITH CORONARY ANGIOGRAM;  Surgeon: Kathleene Hazel, MD;  Location: [facility] CATH LAB;  Service: Cardiovascular;  Laterality: Bilateral;  . Left heart catheterization with coronary angiogram N/A 03/26/2014    Procedure: LEFT HEART CATHETERIZATION WITH CORONARY ANGIOGRAM;  Surgeon: Lennette Bihari, MD;  Location: [facility] CATH LAB;  Service: Cardiovascular;  Laterality: N/A;  . Cardiac catheterization  03/04/2014   Family  History  Problem Relation Age of Onset  . Hypertension Mother   . Hypertension Father   . Hypertension Other   . Diabetes Other   . Heart attack Other    Social History  Substance Use Topics  . Smoking status: Current Every Day Smoker -- 0.00 packs/day for 0 years    Types: Cigarettes  . Smokeless tobacco: Never Used  . Alcohol Use: 0.0 oz/week    0 Standard drinks or equivalent per week     Comment: occ    Review of Systems  Gastrointestinal: Positive for nausea.       + Burning in throat.  Psychiatric/Behavioral: The patient is nervous/anxious.   All other systems reviewed and are negative.     Allergies  Hydrocodone  Home Medications   Prior to Admission medications   Medication Sig Start Date End Date Taking? Authorizing Provider  aspirin 81 MG EC tablet Take 1 tablet (81 mg total) by mouth daily. For health health 06/19/14  Yes Sanjuana Kava, NP  atorvastatin (LIPITOR) 80 MG tablet Take 1 tablet (80 mg total) by mouth daily at 6 PM. For high cholesterol 06/19/14  Yes Sanjuana Kava, NP  clopidogrel (PLAVIX) 75 MG tablet Take 1 tablet (75 mg total) by mouth daily. For prevention of blood clot 06/19/14  Yes Sanjuana Kava, NP  diazepam (VALIUM) 5 MG tablet Take 5 mg by mouth daily as needed for anxiety.   Yes Historical Provider, MD  hydrochlorothiazide (MICROZIDE) 12.5 MG capsule Take 1 capsule (12.5 mg total) by mouth daily. For high blood  pressure 06/19/14  Yes Sanjuana Kava, NP  hydrOXYzine (ATARAX/VISTARIL) 25 MG tablet Take 1 tablet (25 mg total) by mouth 3 (three) times daily with meals as needed for anxiety. 08/06/14  Yes Cleotis Nipper, MD  lamoTRIgine (LAMICTAL) 25 MG tablet Take 1 tab daily for 1 week and than 2 tab daily 08/06/14  Yes Cleotis Nipper, MD  metoprolol tartrate (LOPRESSOR) 25 MG tablet Take 0.5 tablets (12.5 mg total) by mouth 2 (two) times daily. For high blood pressure 06/19/14  Yes Sanjuana Kava, NP  nitroGLYCERIN (NITROSTAT) 0.4 MG SL tablet Place 1 tablet  (0.4 mg total) under the tongue every 5 (five) minutes x 3 doses as needed for chest pain. 06/19/14  Yes Sanjuana Kava, NP  traZODone (DESYREL) 50 MG tablet Take 1 tablet (50 mg total) by mouth at bedtime as needed for sleep. 08/06/14  Yes Cleotis Nipper, MD  LORazepam (ATIVAN) 1 MG tablet Take 1 tablet (1 mg total) by mouth every 6 (six) hours as needed for anxiety. 10/20/14   Basem Yannuzzi M Azlin Zilberman, PA-C  pantoprazole (PROTONIX) 40 MG tablet Take 1 tablet (40 mg total) by mouth daily. For acid reflux Patient not taking: Reported on 10/20/2014 06/19/14   Sanjuana Kava, NP   BP 139/67 mmHg  Pulse 63  Temp(Src) 98.5 F (36.9 C) (Oral)  Resp 18  SpO2 96% Physical Exam  Constitutional: He is oriented to person, place, and time. He appears well-developed and well-nourished. No distress.  HENT:  Head: Normocephalic and atraumatic.  Mouth/Throat: Oropharynx is clear and moist.  Eyes: Conjunctivae and EOM are normal. Pupils are equal, round, and reactive to light.  Neck: Normal range of motion. Neck supple. No JVD present.  Cardiovascular: Normal rate, regular rhythm, normal heart sounds and intact distal pulses.   No extremity edema.  Pulmonary/Chest: Effort normal and breath sounds normal. No respiratory distress.  Abdominal: Soft. Bowel sounds are normal. There is no tenderness.  Musculoskeletal: Normal range of motion. He exhibits no edema.  Neurological: He is alert and oriented to person, place, and time. He has normal strength. No sensory deficit.  Speech fluent, goal oriented. Moves extremities without ataxia. Equal grip strength bilateral.  Skin: Skin is warm and dry. He is not diaphoretic.  Psychiatric: He has a normal mood and affect. His behavior is normal.  Nursing note and vitals reviewed.   ED Course  Procedures (including critical care time) Labs Review Labs Reviewed  CBC - Abnormal; Notable for the following:    RDW 15.9 (*)    All other components within normal limits  BASIC  METABOLIC PANEL - Abnormal; Notable for the following:    Calcium 8.7 (*)    All other components within normal limits  I-STAT TROPOININ, ED    Imaging Review Dg Chest 2 View  10/20/2014   CLINICAL DATA:  Heartburn, throat pain to the stomach, shortness of breath, and dry cough.  EXAM: CHEST  2 VIEW  COMPARISON:  06/01/2014  FINDINGS: The heart size and mediastinal contours are within normal limits. Both lungs are clear. The visualized skeletal structures are unremarkable.  IMPRESSION: No active cardiopulmonary disease.   Electronically Signed   By: Burman Nieves M.D.   On: 10/20/2014 21:10   I have personally reviewed and evaluated these images and lab results as part of my medical decision-making.   EKG Interpretation   Date/Time:  Tuesday October 20 2014 20:36:17 EDT Ventricular Rate:  59 PR Interval:  186 QRS  Duration: 93 QT Interval:  391 QTC Calculation: 387 R Axis:   51 Text Interpretation:  Sinus rhythm Probable left atrial enlargement Left  ventricular hypertrophy Nonspecific T abnormalities, inferior leads ST  elevation, consider anterior injury No significant change since last  tracing ST elevatopm om V1 adm V2, similar to last Confirmed by Kandis Mannan ([id number]) on 10/20/2014 8:56:44 PM      MDM   Final diagnoses:  Stress at work  Anxiety  Heartburn   Nontoxic appearing, NAD. AF VSS. Symptoms ongoing for 1 month; nothing new today that brought him into the ED. Under an immense amount of stress at work and becoming anxious. No improvement after GI cocktail, however significant improvement after receiving Ativan. Labs without acute finding. Troponin negative. EKG without any acute changes from his old EKG. Symptoms most likely from increased stress at work with anxiety. Doubt cardiac. No acute finding today concerning requiring further workup or admission. Has appt with PCP next week. Will d/c home with 10 tablets ativan. OOW note for 3 days to relieve stress.  Stable for d/c. Return precautions given. Patient states understanding of treatment care plan and is agreeable.  Kathrynn Speed, PA-C 10/20/14 2247  Courteney Randall An, MD 10/23/14 2350

## 2014-10-22 ENCOUNTER — Ambulatory Visit (INDEPENDENT_AMBULATORY_CARE_PROVIDER_SITE_OTHER): Payer: BLUE CROSS/BLUE SHIELD | Admitting: Cardiology

## 2014-10-22 ENCOUNTER — Encounter: Payer: Self-pay | Admitting: Cardiology

## 2014-10-22 ENCOUNTER — Telehealth: Payer: Self-pay

## 2014-10-22 VITALS — BP 110/64 | HR 63 | Ht 67.0 in | Wt 164.1 lb

## 2014-10-22 DIAGNOSIS — I2583 Coronary atherosclerosis due to lipid rich plaque: Principal | ICD-10-CM

## 2014-10-22 DIAGNOSIS — I2510 Atherosclerotic heart disease of native coronary artery without angina pectoris: Secondary | ICD-10-CM

## 2014-10-22 DIAGNOSIS — I119 Hypertensive heart disease without heart failure: Secondary | ICD-10-CM

## 2014-10-22 DIAGNOSIS — Z720 Tobacco use: Secondary | ICD-10-CM | POA: Diagnosis not present

## 2014-10-22 DIAGNOSIS — I351 Nonrheumatic aortic (valve) insufficiency: Secondary | ICD-10-CM | POA: Diagnosis not present

## 2014-10-22 LAB — CBC WITH DIFFERENTIAL/PLATELET
BASOS ABS: 0 10*3/uL (ref 0.0–0.1)
Basophils Relative: 0.4 % (ref 0.0–3.0)
EOS ABS: 0.1 10*3/uL (ref 0.0–0.7)
Eosinophils Relative: 2.1 % (ref 0.0–5.0)
HCT: 43.3 % (ref 39.0–52.0)
Hemoglobin: 14.4 g/dL (ref 13.0–17.0)
LYMPHS ABS: 1.8 10*3/uL (ref 0.7–4.0)
Lymphocytes Relative: 28.6 % (ref 12.0–46.0)
MCHC: 33.3 g/dL (ref 30.0–36.0)
MCV: 86.5 fl (ref 78.0–100.0)
MONO ABS: 0.6 10*3/uL (ref 0.1–1.0)
MONOS PCT: 9.7 % (ref 3.0–12.0)
NEUTROS PCT: 59.2 % (ref 43.0–77.0)
Neutro Abs: 3.6 10*3/uL (ref 1.4–7.7)
PLATELETS: 277 10*3/uL (ref 150.0–400.0)
RBC: 5.01 Mil/uL (ref 4.22–5.81)
RDW: 16.8 % — ABNORMAL HIGH (ref 11.5–15.5)
WBC: 6.1 10*3/uL (ref 4.0–10.5)

## 2014-10-22 LAB — HEPATIC FUNCTION PANEL
ALK PHOS: 60 U/L (ref 39–117)
ALT: 23 U/L (ref 0–53)
AST: 27 U/L (ref 0–37)
Albumin: 4.1 g/dL (ref 3.5–5.2)
BILIRUBIN DIRECT: 0.1 mg/dL (ref 0.0–0.3)
BILIRUBIN TOTAL: 0.7 mg/dL (ref 0.2–1.2)
Total Protein: 7.5 g/dL (ref 6.0–8.3)

## 2014-10-22 LAB — BASIC METABOLIC PANEL
BUN: 14 mg/dL (ref 6–23)
CALCIUM: 9.3 mg/dL (ref 8.4–10.5)
CO2: 26 mEq/L (ref 19–32)
CREATININE: 1.13 mg/dL (ref 0.40–1.50)
Chloride: 104 mEq/L (ref 96–112)
GFR: 90.25 mL/min (ref >60.00)
GLUCOSE: 98 mg/dL (ref 70–99)
Potassium: 4 mEq/L (ref 3.5–5.1)
SODIUM: 136 meq/L (ref 135–145)

## 2014-10-22 LAB — LIPID PANEL
Cholesterol: 182 mg/dL (ref 0–200)
HDL: 49.8 mg/dL (ref >39.00)
LDL CALC: 110 mg/dL — AB (ref 0–99)
NONHDL: 131.83
Total CHOL/HDL Ratio: 4
Triglycerides: 107 mg/dL (ref 0.0–149.0)
VLDL: 21.4 mg/dL (ref 0.0–40.0)

## 2014-10-22 LAB — TSH: TSH: 0.45 u[IU]/mL (ref 0.35–4.50)

## 2014-10-22 MED ORDER — EZETIMIBE 10 MG PO TABS: 10.0000 mg | ORAL_TABLET | Freq: Every day | ORAL | Status: DC

## 2014-10-22 NOTE — Telephone Encounter (Signed)
-----   Message from Quintella Reichert, MD sent at 10/22/2014  4:23 PM EDT ----- LDL not at goal - add Zetia  daily and recheck FLP and ALT in 8 weeks

## 2014-10-22 NOTE — Patient Instructions (Signed)
Medication Instructions:  Your physician recommends that you continue on your current medications as directed. Please refer to the Current Medication list given to you today.   Labwork: TODAY: BMET, CBC, TSH, LFTs, Lipids  Testing/Procedures: None  Follow-Up: Your physician wants you to follow-up in: 6 months with Dr. Mayford Knife. You will receive a reminder letter in the mail two months in advance. If you don't receive a letter, please call our office to schedule the follow-up appointment.   Any Other Special Instructions Will Be Listed Below (If Applicable).

## 2014-10-22 NOTE — Telephone Encounter (Signed)
Informed patient of results and verbal understanding expressed.   Instructed patient to START ZETIA 10 mg daily. FLP and ALT scheduled October 28. Patient agrees with treatment plan.

## 2014-10-22 NOTE — Progress Notes (Signed)
Cardiology Office Note   Date:  10/22/2014   ID:  Darren [name], DOB [birth year], MRN [id number]  PCP:  REDMON,NOELLE, PA-C    Chief Complaint  Patient presents with  . Follow-up    cad      History of Present Illness: Darren [name] is a 44 y.o. male with a history of HTN and CAD s/p DES to mLAD (02/2014) who presents to the clinic today for follow up.   He presented to [facility] on 03/04/2014 with chest pain and NSTEMI. He was cathed and had ulcerated plaque in the mid LAD that was treated with PCI/DES x 1. Echo revealed LVEF 60-65%, severe concentric LVH, normal wall motion, diastolicdysfunction, normal LV filling pressure, moderate AI, trivial MR. He had recurrent CP and had repeat cardiac catheterization on 03/26/2014 which revealed EF 50-55%, without focal segmental wall motion abnormality, widely patent mid LAD stent without evidence of restenosis and no other evidence of CAD. He presents today for followup and says he is doing better.  He says that the CP has improved and rarely will have an episode of atypical CP.  He has more problems with GERD.  He works outside a lot and will get very fatigued when working in the heat.  He still has DOE but has significantly improved.  He denies any LE edema, dizziness, palpitations, dizziness or syncope.    Past Medical History  Diagnosis Date  . Hypertension   . Tobacco abuse   . Anxiety   . Depression   . Aortic insufficiency     moderate by echo 04/2014  . Coronary artery disease 03/24/2014    NSTEMI s/p PCI of mid LAD and repeat cath with patent stent    Past Surgical History  Procedure Laterality Date  . Left heart catheterization with coronary angiogram Bilateral 03/04/2014    Procedure: LEFT HEART CATHETERIZATION WITH CORONARY ANGIOGRAM;  Surgeon: Kathleene Hazel, MD;  Location: [facility] CATH LAB;  Service: Cardiovascular;  Laterality: Bilateral;  . Left heart catheterization with coronary angiogram N/A 03/26/2014      Procedure: LEFT HEART CATHETERIZATION WITH CORONARY ANGIOGRAM;  Surgeon: Lennette Bihari, MD;  Location: [facility] CATH LAB;  Service: Cardiovascular;  Laterality: N/A;  . Cardiac catheterization  03/04/2014    PCI of mid LAD     Current Outpatient Prescriptions  Medication Sig Dispense Refill  . aspirin 81 MG EC tablet Take 1 tablet (81 mg total) by mouth daily. For health health 30 tablet 12  . atorvastatin (LIPITOR) 80 MG tablet Take 1 tablet (80 mg total) by mouth daily at 6 PM. For high cholesterol 30 tablet 5  . clopidogrel (PLAVIX) 75 MG tablet Take 1 tablet (75 mg total) by mouth daily. For prevention of blood clot 30 tablet 5  . diazepam (VALIUM) 5 MG tablet Take 5 mg by mouth daily as needed for anxiety.    . hydrochlorothiazide (MICROZIDE) 12.5 MG capsule Take 1 capsule (12.5 mg total) by mouth daily. For high blood pressure 30 capsule 5  . hydrOXYzine (ATARAX/VISTARIL) 25 MG tablet Take 1 tablet (25 mg total) by mouth 3 (three) times daily with meals as needed for anxiety. 90 tablet 0  . lamoTRIgine (LAMICTAL) 25 MG tablet Take 1 tab daily for 1 week and than 2 tab daily 60 tablet 0  . LORazepam (ATIVAN) 1 MG tablet Take 1 tablet (1 mg total) by mouth every  6 (six) hours as needed for anxiety. 10 tablet 0  . metoprolol tartrate (LOPRESSOR) 25 MG tablet Take 0.5 tablets (12.5 mg total) by mouth 2 (two) times daily. For high blood pressure    . nitroGLYCERIN (NITROSTAT) 0.4 MG SL tablet Place 1 tablet (0.4 mg total) under the tongue every 5 (five) minutes x 3 doses as needed for chest pain. 25 tablet 12  . pantoprazole (PROTONIX) 40 MG tablet Take 1 tablet (40 mg total) by mouth daily. For acid reflux 30 tablet 0  . traZODone (DESYREL) 50 MG tablet Take 1 tablet (50 mg total) by mouth at bedtime as needed for sleep. 30 tablet 0   No current facility-administered medications for this visit.    Allergies:   Hydrocodone    Social History:  The patient  reports that he has been smoking  Cigarettes.  He has been smoking about 0.00 packs per day for the past 0 years. He has never used smokeless tobacco. He reports that he drinks alcohol. He reports that he uses illicit drugs (Marijuana).   Family History:  The patient's family history includes Diabetes in his other; Heart attack in his other; Hypertension in his father, mother, and other.    ROS:  Please see the history of present illness.   Otherwise, review of systems are positive for none.   All other systems are reviewed and negative.    PHYSICAL EXAM: VS:  BP 110/64 mmHg  Pulse 63  Ht 5\' 7"  (1.702 m)  Wt 164 lb 1.9 oz (74.444 kg)  BMI 25.70 kg/m2  SpO2 98% , BMI Body mass index is 25.7 kg/(m^2). GEN: Well nourished, well developed, in no acute distress HEENT: normal Neck: no JVD, carotid bruits, or masses Cardiac: RRR; no murmurs, rubs, or gallops,no edema  Respiratory:  clear to auscultation bilaterally, normal work of breathing GI: soft, nontender, nondistended, + BS MS: no deformity or atrophy Skin: warm and dry, no rash Neuro:  Strength and sensation are intact Psych: euthymic mood, full affect   EKG:  EKG is not ordered today.    Recent Labs: 03/03/2014: B Natriuretic Peptide 13.4 03/25/2014: Magnesium 2.0 04/21/2014: Pro B Natriuretic peptide (BNP) 11.0; TSH 0.33* 06/15/2014: ALT 32 10/20/2014: BUN 16; Creatinine, Ser 1.18; Hemoglobin 13.2; Platelets 285; Potassium 3.7; Sodium 136    Lipid Panel    Component Value Date/Time   CHOL 152 03/04/2014 0316   TRIG 100 03/04/2014 0316   HDL 44 03/04/2014 0316   CHOLHDL 3.5 03/04/2014 0316   VLDL 20 03/04/2014 0316   LDLCALC 88 03/04/2014 0316      Wt Readings from Last 3 Encounters:  10/22/14 164 lb 1.9 oz (74.444 kg)  08/06/14 168 lb 3.2 oz (76.295 kg)  06/16/14 157 lb (71.215 kg)    ASSESSMENT AND PLAN:  Darren [name] is a 44 y.o. male with a history of HTN and CAD s/p DES to mLAD (02/2014) who presents to the clinic today for follow up.    CAD - His chronic atypical CP has significantly improved and SOB has improved.   -- Continue DAPT with ASA/Plavix, metoprolol and statin.   Bradycardia - resolved  HTN- controlled on BB/HCTZ  Tobacco abuse- he is back to smoking but is trying to cut back.  I have encouraged him to try to quit.  Moderate AI - repeat echo 04/2015  Fatigue - ? Etiology.  I will check TSH, CMET, CBC  Dyslipidemia - recheck FLP and ALT.  Continue statin  Current medicines are reviewed at length with the patient today.  The patient does not have concerns regarding medicines.  The following changes have been made:  no change  Labs/ tests ordered today: See above Assessment and Plan  Orders Placed This Encounter  Procedures  . TSH  . Basic metabolic panel  . CBC with Differential/Platelet  . Hepatic function panel  . Lipid panel     Disposition:   FU with me in 6 months  Signed, Quintella Reichert, MD  10/22/2014 2:40 PM    [facility] [address] [place], [place], [place]  [address] Phone: [phone]; Fax: [phone]

## 2014-11-12 ENCOUNTER — Telehealth (HOSPITAL_COMMUNITY): Payer: Self-pay

## 2014-11-12 NOTE — Telephone Encounter (Signed)
Medication management - Telephone call with patient after he left a message requesting change to Xanax and need to make an appointment after canceled 09/23/14 and only seen once on 08/06/14 by Dr. Lolly Mustache.  Patient reported a friend of his let him have a Xanax and "that worked much better".  Informed Darren [name] he had only been seen one time, did not currently have an appointment scheduled and it was unlikely Dr. Lolly Mustache would agreed to write a medication such as Xanax, a Benzodiazepine that is a controlled substance without seeing patient first.  Arranged to have an appointment made for patient for first available with Dr. Lolly Mustache and informed this may be out approximately one month.  Patient requested a refill of Hydroxyzine and Trazodone, even though states they don't work well until can be seen.  Arranged for patient to be called back with an appointment and agreed to send information to Dr. Lolly Mustache.  Patient requested 11/30/14 if any openings due to off work that date. Informed Dr. Lolly Mustache was booked pretty tight so this may not be possible and patient stated understanding may be for first available.

## 2014-11-13 ENCOUNTER — Other Ambulatory Visit (HOSPITAL_COMMUNITY): Payer: Self-pay | Admitting: Psychiatry

## 2014-11-13 NOTE — Telephone Encounter (Signed)
No xanax. Needs to be seen.

## 2014-11-17 NOTE — Telephone Encounter (Signed)
Telephone call with patient to inform Dr. Lolly Mustache would not be willing to write patient Xanax medication without evaluating for need first.  Informed patient he currently does not have an appointment scheduled and assisted patient with arranging to come in on 11/19/14 at 2:15pm due to reported problems with anxiety and need to be seen for first available.

## 2014-11-19 ENCOUNTER — Encounter (HOSPITAL_COMMUNITY): Payer: Self-pay | Admitting: Psychiatry

## 2014-11-19 ENCOUNTER — Ambulatory Visit (INDEPENDENT_AMBULATORY_CARE_PROVIDER_SITE_OTHER): Payer: BLUE CROSS/BLUE SHIELD | Admitting: Psychiatry

## 2014-11-19 VITALS — BP 126/78 | HR 82 | Ht 67.0 in | Wt 166.0 lb

## 2014-11-19 DIAGNOSIS — F331 Major depressive disorder, recurrent, moderate: Secondary | ICD-10-CM | POA: Diagnosis not present

## 2014-11-19 DIAGNOSIS — F1020 Alcohol dependence, uncomplicated: Secondary | ICD-10-CM | POA: Diagnosis not present

## 2014-11-19 MED ORDER — LAMOTRIGINE 25 MG PO TABS: ORAL_TABLET | ORAL | Status: DC

## 2014-11-19 MED ORDER — TRAZODONE HCL 50 MG PO TABS: 50.0000 mg | ORAL_TABLET | Freq: Every evening | ORAL | Status: DC | PRN

## 2014-11-19 MED ORDER — HYDROXYZINE HCL 25 MG PO TABS: 25.0000 mg | ORAL_TABLET | Freq: Three times a day (TID) | ORAL | Status: DC | PRN

## 2014-11-19 NOTE — Progress Notes (Signed)
[facility] [phone] Progress Note  Darren [name] [id number] 44 y.o.  11/19/2014 3:16 PM  Chief Complaint:  I am out of my medication.  I'm feeling depressed and irritable.    History of Present Illness:  Jaishaun came for his follow-up appointment.  He was seen first time on 08/06/2014.  He is a 44 year old African-American divorced employed man who was released from inpatient psychiatric services .  After that patient had missed appointments .  He was given Lamictal and after a few weeks he ran out and never came for the appointment.  He's been experiencing increased irritability, anger, mood swing.  He sleeping on and off.  He told he was unable to leave from his work for doctors appointment.  Finally he had decided to inform his employer that he needed to go for doctors appointment and hoping that it will work.  He is not sure if his FMLA has been submitted .  Patient told since he left the hospital he's been not drinking .  However he has a lot of family issues.  He is unable to see his 18-year-old daughter one by one.  Patient told he can see her supervised at her girlfriends mother place but he does not like it.  Patient is not sure why his girlfriend have trust issue on him.  He admitted not paying child support and is a court date on October third.  Patient is able to visit his other 3 girls.  He now living in his own place with a roommate.  He admitted easily irritable, angry, mood swing, having anger issues.  Recently he was seen in the emergency room because of pain and he has a EKG.  He was given Valium.  Earlier he was given lorazepam from his primary care physician.  He has taken Xanax from his friend.  He wants to take Xanax however he was told given the history of heavy drinking and drug use no benzos will be given.  He is not sure if he is taking Vistaril as prescribed.  He takes trazodone when he cannot sleep very well.  He like to go back on Lamictal.  He admitted Lamictal  was helping his mood and irritability.  He do not recall having any rash, itching, headaches.  Patient denies any paranoia or any hallucination but admitted more isolated, withdrawn and easily irritable.  He denies drinking since he left the hospital.  His appetite is okay.  His vitals are stable.  Suicidal Ideation: No Plan Formed: No Patient has means to carry out plan: No  Homicidal Ideation: No Plan Formed: No Patient has means to carry out plan: No  Past Psychiatric History/Hospitalization; Patient has at least 3 psychiatric hospitalization which is usually complicated by alcohol use, relationship issues and severe depression.  Though he has no suicidal attempt but admitted suicidal thoughts and plan when he is severely depressed.  He had tried Lexapro, Valium in the past.  He has been not seeing psychiatrist regularly.  Patient denies any history of physical or sexual abuse in the past.  He has been arrested for drug charges and [id number].  Currently he is not in any probation. Anxiety: Yes Bipolar Disorder: No Depression: Yes Mania: No Psychosis: No Schizophrenia: No Personality Disorder: No Hospitalization for psychiatric illness: Yes History of Electroconvulsive Shock Therapy: No Prior Suicide Attempts: No  Medical History; Patient has hypertension, coronary artery disease and status post stent .    Social History; Patient is raised by  his mother and stepfather.  He has not seen his father in many years.  He has 5 children.  He has 20 year old son with his first relationship than he has 3 children age 59, 74 and 58 from his ex-wife and he has 59-year-old from his last relationship.  He has a brother who lives in [place].    Family History; Patient is unknown if his family member has any depression or psychiatric illness.    Legal History; Patient was arrested and incarcerated for 7 months due to drug charges.  He is behind child support and his next court date is an October 3.     Substance Abuse History; Patient admitted history of drinking alcohol, smoking marijuana and using cocaine in the past.  He claims to be sober from marijuana and cocaine but admitted still drinking on and off.  He denies any binge and this time he does not have any withdrawal symptoms.   Review of Systems  Constitutional: Negative.   Cardiovascular: Negative.   Musculoskeletal: Negative.   Skin: Negative for itching and rash.  Neurological: Negative for dizziness, tingling, tremors and headaches.    Psychiatric: Agitation: Yes Hallucination: No Depressed Mood: Yes Insomnia: Yes Hypersomnia: No Altered Concentration: No Feels Worthless: No Grandiose Ideas: No Belief In Special Powers: No New/Increased Substance Abuse: No Compulsions: No  Neurologic: Headache: No Seizure: No Paresthesias: No   Outpatient Encounter Prescriptions as of 11/19/2014  Medication Sig  . aspirin 81 MG EC tablet Take 1 tablet (81 mg total) by mouth daily. For health health  . atorvastatin (LIPITOR) 80 MG tablet Take 1 tablet (80 mg total) by mouth daily at 6 PM. For high cholesterol  . clopidogrel (PLAVIX) 75 MG tablet Take 1 tablet (75 mg total) by mouth daily. For prevention of blood clot  . ezetimibe (ZETIA) 10 MG tablet Take 1 tablet (10 mg total) by mouth daily.  . hydrochlorothiazide (MICROZIDE) 12.5 MG capsule Take 1 capsule (12.5 mg total) by mouth daily. For high blood pressure  . hydrOXYzine (ATARAX/VISTARIL) 25 MG tablet Take 1 tablet (25 mg total) by mouth 3 (three) times daily with meals as needed for anxiety.  . lamoTRIgine (LAMICTAL) 25 MG tablet Take 1 tab daily for 1 week and than 2 tab daily for 1 week and than 3 tab daily  . metoprolol tartrate (LOPRESSOR) 25 MG tablet Take 0.5 tablets (12.5 mg total) by mouth 2 (two) times daily. For high blood pressure  . nitroGLYCERIN (NITROSTAT) 0.4 MG SL tablet Place 1 tablet (0.4 mg total) under the tongue every 5 (five) minutes x 3 doses as  needed for chest pain.  . pantoprazole (PROTONIX) 40 MG tablet Take 1 tablet (40 mg total) by mouth daily. For acid reflux  . traZODone (DESYREL) 50 MG tablet Take 1 tablet (50 mg total) by mouth at bedtime as needed for sleep.  . [DISCONTINUED] diazepam (VALIUM) 5 MG tablet Take 5 mg by mouth daily as needed for anxiety.  . [DISCONTINUED] hydrOXYzine (ATARAX/VISTARIL) 25 MG tablet Take 1 tablet (25 mg total) by mouth 3 (three) times daily with meals as needed for anxiety.  . [DISCONTINUED] lamoTRIgine (LAMICTAL) 25 MG tablet Take 1 tab daily for 1 week and than 2 tab daily  . [DISCONTINUED] LORazepam (ATIVAN) 1 MG tablet Take 1 tablet (1 mg total) by mouth every 6 (six) hours as needed for anxiety.  . [DISCONTINUED] traZODone (DESYREL) 50 MG tablet Take 1 tablet (50 mg total) by mouth at bedtime as needed  for sleep.   No facility-administered encounter medications on file as of 11/19/2014.    Recent Results (from the past 2160 hour(s))  CBC     Status: Abnormal   Collection Time: 10/20/14  8:30 PM  Result Value Ref Range   WBC 6.9 4.0 - 10.5 K/uL   RBC 4.65 4.22 - 5.81 MIL/uL   Hemoglobin 13.2 13.0 - 17.0 g/dL   HCT 40.0 39.0 - 52.0 %   MCV 86.0 78.0 - 100.0 fL   MCH 28.4 26.0 - 34.0 pg   MCHC 33.0 30.0 - 36.0 g/dL   RDW 15.9 (H) 11.5 - 15.5 %   Platelets 285 150 - 400 K/uL  Basic metabolic panel     Status: Abnormal   Collection Time: 10/20/14  8:30 PM  Result Value Ref Range   Sodium 136 135 - 145 mmol/L   Potassium 3.7 3.5 - 5.1 mmol/L   Chloride 106 101 - 111 mmol/L   CO2 24 22 - 32 mmol/L   Glucose, Bld 96 65 - 99 mg/dL   BUN 16 6 - 20 mg/dL   Creatinine, Ser 1.18 0.61 - 1.24 mg/dL   Calcium 8.7 (L) 8.9 - 10.3 mg/dL   GFR calc non Af Amer >60 >60 mL/min   GFR calc Af Amer >60 >60 mL/min    Comment: (NOTE) The eGFR has been calculated using the CKD EPI equation. This calculation has not been validated in all clinical situations. eGFR's persistently <60 mL/min signify  possible Chronic Kidney Disease.    Anion gap 6 5 - 15  I-stat troponin, ED     Status: None   Collection Time: 10/20/14  8:39 PM  Result Value Ref Range   Troponin i, poc 0.00 0.00 - 0.08 ng/mL   Comment 3            Comment: Due to the release kinetics of cTnI, a negative result within the first hours of the onset of symptoms does not rule out myocardial infarction with certainty. If myocardial infarction is still suspected, repeat the test at appropriate intervals.   TSH     Status: None   Collection Time: 10/22/14 11:34 AM  Result Value Ref Range   TSH 0.45 0.35 - 4.50 uIU/mL  Basic metabolic panel     Status: None   Collection Time: 10/22/14 11:34 AM  Result Value Ref Range   Sodium 136 135 - 145 mEq/L   Potassium 4.0 3.5 - 5.1 mEq/L   Chloride 104 96 - 112 mEq/L   CO2 26 19 - 32 mEq/L   Glucose, Bld 98 70 - 99 mg/dL   BUN 14 6 - 23 mg/dL   Creatinine, Ser 1.13 0.40 - 1.50 mg/dL   Calcium 9.3 8.4 - 10.5 mg/dL   GFR 90.25 >60.00 mL/min  CBC with Differential/Platelet     Status: Abnormal   Collection Time: 10/22/14 11:34 AM  Result Value Ref Range   WBC 6.1 4.0 - 10.5 K/uL   RBC 5.01 4.22 - 5.81 Mil/uL   Hemoglobin 14.4 13.0 - 17.0 g/dL   HCT 43.3 39.0 - 52.0 %   MCV 86.5 78.0 - 100.0 fl   MCHC 33.3 30.0 - 36.0 g/dL   RDW 16.8 (H) 11.5 - 15.5 %   Platelets 277.0 150.0 - 400.0 K/uL   Neutrophils Relative % 59.2 43.0 - 77.0 %   Lymphocytes Relative 28.6 12.0 - 46.0 %   Monocytes Relative 9.7 3.0 - 12.0 %   Eosinophils Relative  2.1 0.0 - 5.0 %   Basophils Relative 0.4 0.0 - 3.0 %   Neutro Abs 3.6 1.4 - 7.7 K/uL   Lymphs Abs 1.8 0.7 - 4.0 K/uL   Monocytes Absolute 0.6 0.1 - 1.0 K/uL   Eosinophils Absolute 0.1 0.0 - 0.7 K/uL   Basophils Absolute 0.0 0.0 - 0.1 K/uL  Hepatic function panel     Status: None   Collection Time: 10/22/14 11:34 AM  Result Value Ref Range   Total Bilirubin 0.7 0.2 - 1.2 mg/dL   Bilirubin, Direct 0.1 0.0 - 0.3 mg/dL   Alkaline  Phosphatase 60 39 - 117 U/L   AST 27 0 - 37 U/L   ALT 23 0 - 53 U/L   Total Protein 7.5 6.0 - 8.3 g/dL   Albumin 4.1 3.5 - 5.2 g/dL  Lipid panel     Status: Abnormal   Collection Time: 10/22/14 11:34 AM  Result Value Ref Range   Cholesterol 182 0 - 200 mg/dL    Comment: ATP III Classification       Desirable:  < 200 mg/dL               Borderline High:  200 - 239 mg/dL          High:  > = 240 mg/dL   Triglycerides 107.0 0.0 - 149.0 mg/dL    Comment: Normal:  <150 mg/dLBorderline High:  150 - 199 mg/dL   HDL 49.80 >39.00 mg/dL   VLDL 21.4 0.0 - 40.0 mg/dL   LDL Cholesterol 110 (H) 0 - 99 mg/dL   Total CHOL/HDL Ratio 4     Comment:                Men          Women1/2 Average Risk     3.4          3.3Average Risk          5.0          4.42X Average Risk          9.6          7.13X Average Risk          15.0          11.0                       NonHDL 131.83     Comment: NOTE:  Non-HDL goal should be 30 mg/dL higher than patient's LDL goal (i.e. LDL goal of < 70 mg/dL, would have non-HDL goal of < 100 mg/dL)      Constitutional:  BP 126/78 mmHg  Pulse 82  Ht 5' 7"  (1.702 m)  Wt 166 lb (75.297 kg)  BMI 25.99 kg/m2   Musculoskeletal: Strength & Muscle Tone: within normal limits Gait & Station: normal Patient leans: Backward  Psychiatric Specialty Exam: General Appearance: Casual  Eye Contact::  Fair  Speech:  Slow  Volume:  Decreased  Mood:  Anxious, Depressed and Dysphoric  Affect:  Constricted and Depressed  Thought Process:  Coherent and Linear  Orientation:  Full (Time, Place, and Person)  Thought Content:  Rumination  Suicidal Thoughts:  No  Homicidal Thoughts:  No  Memory:  Immediate;   Fair Recent;   Fair Remote;   Fair  Judgement:  Fair  Insight:  Fair  Psychomotor Activity:  Decreased  Concentration:  Fair  Recall:  [employer] of Knowledge:  Fair  Language:  Fair  Akathisia:  No  Handed:  Right  AIMS (if indicated):     Assets:  Communication  Skills Desire for Improvement Housing Social Support  ADL's:  Intact  Cognition:  WNL  Sleep:        New problem, with additional work up planned, Review of Psycho-Social Stressors (1), Review or order clinical lab tests (1), Decision to obtain old records (1), Review and summation of old records (2), Established Problem, Worsening (2), New Problem, with no additional work-up planned (3), Review of Medication Regimen & Side Effects (2) and Review of New Medication or Change in Dosage (2)  Assessment: Axis Alcohol dependence, Major depressive disorder, recurrent.  Rule out bipolar disorder depressed type  Axis II: Deferred   Axis III:  Past Medical History  Diagnosis Date  . Hypertension   . Tobacco abuse   . Anxiety   . Depression   . Aortic insufficiency     moderate by echo 04/2014  . Coronary artery disease 03/24/2014    NSTEMI s/p PCI of mid LAD and repeat cath with patent stent     Plan:  I review his records including recent visit to emergency room , current medication and blood work results.  Patient is not seeing any therapist.  I strongly encouraged to see a therapist in this office for ongoing psychosocial issues.  Restart Lamictal 25 mg daily for 1 week and then 2 tablet daily for 1 week and then 3 tablet daily.  Reminded him if he had a rash that he needed to stop the medication immediately.  Continue Vistaril 25 mg as needed for severe anxiety.  Continue trazodone 50 mg for insomnia as needed.  Discontinue Valium and lorazepam which was given by other providers.  Discussed benzodiazepine dependence, tolerance, abuse.  Patient will contact human resources to explore FMLA.  Discuss noncompliance with medication and follow-up policy in detail.  Discuss safety plan that anytime having active suicidal thoughts or homicidal thoughts then he need to call 911 or go to the local emergency room.  Follow-up in 4 weeks.   ARFEEN,SYED T., MD 11/19/2014

## 2014-12-17 ENCOUNTER — Encounter (HOSPITAL_COMMUNITY): Payer: Self-pay | Admitting: Psychiatry

## 2014-12-17 ENCOUNTER — Ambulatory Visit (INDEPENDENT_AMBULATORY_CARE_PROVIDER_SITE_OTHER): Payer: BLUE CROSS/BLUE SHIELD | Admitting: Psychiatry

## 2014-12-17 VITALS — BP 138/72 | HR 98 | Ht 67.0 in | Wt 164.5 lb

## 2014-12-17 DIAGNOSIS — F331 Major depressive disorder, recurrent, moderate: Secondary | ICD-10-CM | POA: Diagnosis not present

## 2014-12-17 MED ORDER — LAMOTRIGINE 100 MG PO TABS: 100.0000 mg | ORAL_TABLET | Freq: Every day | ORAL | Status: DC

## 2014-12-17 MED ORDER — HYDROXYZINE HCL 25 MG PO TABS: 25.0000 mg | ORAL_TABLET | Freq: Three times a day (TID) | ORAL | Status: DC | PRN

## 2014-12-17 NOTE — Progress Notes (Signed)
[place] Progress Note  Darren [name] [id number] 44 y.o.  12/17/2014 11:10 AM  Chief Complaint:  I still have a lot of irritability but I feel medicines are helping me.    History of Present Illness:  Darren [name] came for his follow-up appointment.  We started him on Lamictal and Vistaril.  He is taking 75 mg Lamictal.  He has no rash, itching or any headaches.  He's taking Vistaril 25 mg 3 times a day but is helping his anxiety.  His sleep is improved and he has not taken trazodone in a while.  Despite taking Lamictal he continues to have some irritability and trust issues.  He was please to spend time with HER-73-year-old daughter finally.  He also went to court on October 3 and he was not happy to pay child support but he has no other choice.  He admitted irritability, anger issues but denies any aggressive behavior.  He continues to have issues with his current girlfriend.  He believed trust is a major problem.  We have recommended to see therapist however his appointment is not until last week of November.  He cut down his drinking.  He is back to work but he had missed work 2 days because he was very exhausted.  He is relieved that his FMLA covered him.  Patient is living with roommate.  He admitted usually stays to himself.  He denies any paranoia or any hallucination.  He denies any tremors or shakes.  He denies any feeling of hopelessness or worthlessness.  His energy level is okay.  His appetite is okay.  His vitals are stable.  Patient seen recently his primary care physician Dr. Deirdre Evener at [facility] physician and Dr. Radford Pax his cardiologist.  There has been no changes   In his medication. Suicidal Ideation: No Plan Formed: No Patient has means to carry out plan: No  Homicidal Ideation: No Plan Formed: No Patient has means to carry out plan: No  Past Psychiatric History/Hospitalization; Patient has at least 3 psychiatric hospitalization which is usually complicated by  alcohol use, relationship issues and severe depression.  Though he has no suicidal attempt but admitted suicidal thoughts and plan when he is severely depressed.  He had tried Lexapro, Valium in the past.  He has been arrested for drug charges. Currently he is not in any probation. Anxiety: Yes Bipolar Disorder: No Depression: Yes Mania: No Psychosis: No Schizophrenia: No Personality Disorder: No Hospitalization for psychiatric illness: Yes History of Electroconvulsive Shock Therapy: No Prior Suicide Attempts: No  Medical History; Patient has hypertension, coronary artery disease and status post stent .    Social History; Patient is raised by his mother and stepfather.  He has not seen his father in many years.  He has 5 children.  He has 45 year old son with his first relationship than he has 3 children age 68, 43 and 34 from his ex-wife and he has 90-year-old from his last relationship.  He has a brother who lives in [place].    Family History; Patient is unknown if his family member has any depression or psychiatric illness.    Substance Abuse History; Patient admitted history of drinking alcohol, smoking marijuana and using cocaine in the past.  He claims to be sober from marijuana and cocaine but admitted still drinking on and off.  He denies any binge and this time he does not have any withdrawal symptoms.   Review of Systems  Constitutional: Negative.  Negative for weight  loss and malaise/fatigue.  Cardiovascular: Negative.  Negative for chest pain and palpitations.  Musculoskeletal: Negative.  Negative for myalgias, back pain and neck pain.  Skin: Negative for itching and rash.  Neurological: Negative for dizziness, tingling, tremors and headaches.  Psychiatric/Behavioral: Positive for depression and substance abuse.       Irritability    Psychiatric: Agitation: No Hallucination: No Depressed Mood: Yes Insomnia: Yes Hypersomnia: No Altered Concentration: No Feels  Worthless: No Grandiose Ideas: No Belief In Special Powers: No New/Increased Substance Abuse: No Compulsions: No  Neurologic: Headache: No Seizure: No Paresthesias: No   Outpatient Encounter Prescriptions as of 12/17/2014  Medication Sig  . aspirin 81 MG EC tablet Take 1 tablet (81 mg total) by mouth daily. For health health  . atorvastatin (LIPITOR) 80 MG tablet Take 1 tablet (80 mg total) by mouth daily at 6 PM. For high cholesterol  . clopidogrel (PLAVIX) 75 MG tablet Take 1 tablet (75 mg total) by mouth daily. For prevention of blood clot  . ezetimibe (ZETIA) 10 MG tablet Take 1 tablet (10 mg total) by mouth daily.  . hydrochlorothiazide (MICROZIDE) 12.5 MG capsule Take 1 capsule (12.5 mg total) by mouth daily. For high blood pressure  . hydrOXYzine (ATARAX/VISTARIL) 25 MG tablet Take 1 tablet (25 mg total) by mouth 3 (three) times daily with meals as needed for anxiety.  . lamoTRIgine (LAMICTAL) 100 MG tablet Take 1 tablet (100 mg total) by mouth daily.  . metoprolol tartrate (LOPRESSOR) 25 MG tablet Take 0.5 tablets (12.5 mg total) by mouth 2 (two) times daily. For high blood pressure  . nitroGLYCERIN (NITROSTAT) 0.4 MG SL tablet Place 1 tablet (0.4 mg total) under the tongue every 5 (five) minutes x 3 doses as needed for chest pain.  . pantoprazole (PROTONIX) 40 MG tablet Take 1 tablet (40 mg total) by mouth daily. For acid reflux  . traZODone (DESYREL) 50 MG tablet Take 1 tablet (50 mg total) by mouth at bedtime as needed for sleep.  . [DISCONTINUED] hydrOXYzine (ATARAX/VISTARIL) 25 MG tablet Take 1 tablet (25 mg total) by mouth 3 (three) times daily with meals as needed for anxiety.  . [DISCONTINUED] lamoTRIgine (LAMICTAL) 25 MG tablet Take 1 tab daily for 1 week and than 2 tab daily for 1 week and than 3 tab daily   No facility-administered encounter medications on file as of 12/17/2014.    Recent Results (from the past 2160 hour(s))  CBC     Status: Abnormal   Collection  Time: 10/20/14  8:30 PM  Result Value Ref Range   WBC 6.9 4.0 - 10.5 K/uL   RBC 4.65 4.22 - 5.81 MIL/uL   Hemoglobin 13.2 13.0 - 17.0 g/dL   HCT 40.0 39.0 - 52.0 %   MCV 86.0 78.0 - 100.0 fL   MCH 28.4 26.0 - 34.0 pg   MCHC 33.0 30.0 - 36.0 g/dL   RDW 15.9 (H) 11.5 - 15.5 %   Platelets 285 150 - 400 K/uL  Basic metabolic panel     Status: Abnormal   Collection Time: 10/20/14  8:30 PM  Result Value Ref Range   Sodium 136 135 - 145 mmol/L   Potassium 3.7 3.5 - 5.1 mmol/L   Chloride 106 101 - 111 mmol/L   CO2 24 22 - 32 mmol/L   Glucose, Bld 96 65 - 99 mg/dL   BUN 16 6 - 20 mg/dL   Creatinine, Ser 1.18 0.61 - 1.24 mg/dL   Calcium 8.7 (L) 8.9 -  10.3 mg/dL   GFR calc non Af Amer >60 >60 mL/min   GFR calc Af Amer >60 >60 mL/min    Comment: (NOTE) The eGFR has been calculated using the CKD EPI equation. This calculation has not been validated in all clinical situations. eGFR's persistently <60 mL/min signify possible Chronic Kidney Disease.    Anion gap 6 5 - 15  I-stat troponin, ED     Status: None   Collection Time: 10/20/14  8:39 PM  Result Value Ref Range   Troponin i, poc 0.00 0.00 - 0.08 ng/mL   Comment 3            Comment: Due to the release kinetics of cTnI, a negative result within the first hours of the onset of symptoms does not rule out myocardial infarction with certainty. If myocardial infarction is still suspected, repeat the test at appropriate intervals.   TSH     Status: None   Collection Time: 10/22/14 11:34 AM  Result Value Ref Range   TSH 0.45 0.35 - 4.50 uIU/mL  Basic metabolic panel     Status: None   Collection Time: 10/22/14 11:34 AM  Result Value Ref Range   Sodium 136 135 - 145 mEq/L   Potassium 4.0 3.5 - 5.1 mEq/L   Chloride 104 96 - 112 mEq/L   CO2 26 19 - 32 mEq/L   Glucose, Bld 98 70 - 99 mg/dL   BUN 14 6 - 23 mg/dL   Creatinine, Ser 1.13 0.40 - 1.50 mg/dL   Calcium 9.3 8.4 - 10.5 mg/dL   GFR 90.25 >60.00 mL/min  CBC with  Differential/Platelet     Status: Abnormal   Collection Time: 10/22/14 11:34 AM  Result Value Ref Range   WBC 6.1 4.0 - 10.5 K/uL   RBC 5.01 4.22 - 5.81 Mil/uL   Hemoglobin 14.4 13.0 - 17.0 g/dL   HCT 43.3 39.0 - 52.0 %   MCV 86.5 78.0 - 100.0 fl   MCHC 33.3 30.0 - 36.0 g/dL   RDW 16.8 (H) 11.5 - 15.5 %   Platelets 277.0 150.0 - 400.0 K/uL   Neutrophils Relative % 59.2 43.0 - 77.0 %   Lymphocytes Relative 28.6 12.0 - 46.0 %   Monocytes Relative 9.7 3.0 - 12.0 %   Eosinophils Relative 2.1 0.0 - 5.0 %   Basophils Relative 0.4 0.0 - 3.0 %   Neutro Abs 3.6 1.4 - 7.7 K/uL   Lymphs Abs 1.8 0.7 - 4.0 K/uL   Monocytes Absolute 0.6 0.1 - 1.0 K/uL   Eosinophils Absolute 0.1 0.0 - 0.7 K/uL   Basophils Absolute 0.0 0.0 - 0.1 K/uL  Hepatic function panel     Status: None   Collection Time: 10/22/14 11:34 AM  Result Value Ref Range   Total Bilirubin 0.7 0.2 - 1.2 mg/dL   Bilirubin, Direct 0.1 0.0 - 0.3 mg/dL   Alkaline Phosphatase 60 39 - 117 U/L   AST 27 0 - 37 U/L   ALT 23 0 - 53 U/L   Total Protein 7.5 6.0 - 8.3 g/dL   Albumin 4.1 3.5 - 5.2 g/dL  Lipid panel     Status: Abnormal   Collection Time: 10/22/14 11:34 AM  Result Value Ref Range   Cholesterol 182 0 - 200 mg/dL    Comment: ATP III Classification       Desirable:  < 200 mg/dL               Borderline High:  200 - 239 mg/dL          High:  > = 240 mg/dL   Triglycerides 107.0 0.0 - 149.0 mg/dL    Comment: Normal:  <150 mg/dLBorderline High:  150 - 199 mg/dL   HDL 49.80 >39.00 mg/dL   VLDL 21.4 0.0 - 40.0 mg/dL   LDL Cholesterol 110 (H) 0 - 99 mg/dL   Total CHOL/HDL Ratio 4     Comment:                Men          Women1/2 Average Risk     3.4          3.3Average Risk          5.0          4.42X Average Risk          9.6          7.13X Average Risk          15.0          11.0                       NonHDL 131.83     Comment: NOTE:  Non-HDL goal should be 30 mg/dL higher than patient's LDL goal (i.e. LDL goal of < 70 mg/dL, would  have non-HDL goal of < 100 mg/dL)      Constitutional:  BP 138/72 mmHg  Pulse 98  Ht _0  (1.702 m)  Wt 164 lb 8 oz (74.617 kg)  BMI 25.76 kg/m2   Musculoskeletal: Strength & Muscle Tone: within normal limits Gait & Station: normal Patient leans: N/A  Psychiatric Specialty Exam: General Appearance: Casual  Eye Contact::  Fair  Speech:  Slow  Volume:  Decreased  Mood:  Depressed  Affect:  Constricted  Thought Process:  Coherent and Linear  Orientation:  Full (Time, Place, and Person)  Thought Content:  Rumination  Suicidal Thoughts:  No  Homicidal Thoughts:  No  Memory:  Immediate;   Fair Recent;   Fair Remote;   Fair  Judgement:  Fair  Insight:  Fair  Psychomotor Activity:  Decreased  Concentration:  Fair  Recall:  [employer] of Knowledge:  Fair  Language:  Fair  Akathisia:  No  Handed:  Right  AIMS (if indicated):     Assets:  Communication Skills Desire for Improvement Housing Social Support  ADL's:  Intact  Cognition:  WNL  Sleep:        Established Problem, Stable/Improving (1), Review of Psycho-Social Stressors (1), Review and summation of old records (2), Review of Last Therapy Session (1), Review of Medication Regimen & Side Effects (2) and Review of New Medication or Change in Dosage (2)  Assessment: Axis Alcohol dependence, Major depressive disorder, recurrent.  Rule out bipolar disorder depressed type  Axis II: Deferred   Axis III:  Past Medical History  Diagnosis Date  . Hypertension   . Tobacco abuse   . Anxiety   . Depression   . Aortic insufficiency     moderate by echo 04/2014  . Coronary artery disease 03/24/2014    NSTEMI s/p PCI of mid LAD and repeat cath with patent stent     Plan:  Patient is tolerating his Lamictal without any side effects.  He still have irritability and trust issues.  I recommended to increase Lamictal 100 mg daily.  Continue Vistaril 25 mg 3 times a day.  He is taking trazodone only as needed.  He is  more involved in his treatment plan and recently seen his primary care physician and cardiologist.  He continues to have a lot of psychosocial issues and he is planning to keep appointment with the therapist which is at the end of this month.  Discussed medication side effects and benefits.  Encourage her to consider AA if he has trouble stopping alcohol.  However he has significantly cut down his drinking from the past.  He has no withdrawal symptoms.  He admitted going to [facility] meeting if needed for further help. Discuss safety plan that anytime having active suicidal thoughts or homicidal thoughts then he need to call 911 or go to the local emergency room.  Follow-up in 6 weeks.   Anthone Prieur T., MD 12/17/2014

## 2014-12-21 ENCOUNTER — Ambulatory Visit (HOSPITAL_COMMUNITY): Payer: Self-pay | Admitting: Clinical

## 2014-12-25 ENCOUNTER — Other Ambulatory Visit: Payer: Self-pay

## 2015-01-19 ENCOUNTER — Ambulatory Visit (HOSPITAL_COMMUNITY): Payer: Self-pay | Admitting: Clinical

## 2015-01-28 ENCOUNTER — Encounter: Payer: Self-pay | Admitting: Cardiology

## 2015-01-28 ENCOUNTER — Ambulatory Visit (HOSPITAL_COMMUNITY): Payer: Self-pay | Admitting: Psychiatry

## 2015-03-10 ENCOUNTER — Emergency Department (HOSPITAL_COMMUNITY)
Admission: EM | Admit: 2015-03-10 | Discharge: 2015-03-10 | Disposition: A | Discharge status: Home / Self Care | Payer: BLUE CROSS/BLUE SHIELD | Attending: Emergency Medicine | Admitting: Emergency Medicine

## 2015-03-10 ENCOUNTER — Encounter (HOSPITAL_COMMUNITY): Payer: Self-pay | Admitting: *Deleted

## 2015-03-10 ENCOUNTER — Emergency Department (HOSPITAL_COMMUNITY): Payer: BLUE CROSS/BLUE SHIELD

## 2015-03-10 DIAGNOSIS — R509 Fever, unspecified: Secondary | ICD-10-CM

## 2015-03-10 DIAGNOSIS — Z8659 Personal history of other mental and behavioral disorders: Secondary | ICD-10-CM | POA: Insufficient documentation

## 2015-03-10 DIAGNOSIS — R6883 Chills (without fever): Secondary | ICD-10-CM

## 2015-03-10 DIAGNOSIS — J111 Influenza due to unidentified influenza virus with other respiratory manifestations: Secondary | ICD-10-CM | POA: Diagnosis not present

## 2015-03-10 DIAGNOSIS — F17210 Nicotine dependence, cigarettes, uncomplicated: Secondary | ICD-10-CM | POA: Diagnosis not present

## 2015-03-10 DIAGNOSIS — M791 Myalgia, unspecified site: Secondary | ICD-10-CM

## 2015-03-10 DIAGNOSIS — Z9889 Other specified postprocedural states: Secondary | ICD-10-CM | POA: Diagnosis not present

## 2015-03-10 DIAGNOSIS — R69 Illness, unspecified: Secondary | ICD-10-CM

## 2015-03-10 DIAGNOSIS — R058 Other specified cough: Secondary | ICD-10-CM

## 2015-03-10 DIAGNOSIS — I10 Essential (primary) hypertension: Secondary | ICD-10-CM | POA: Insufficient documentation

## 2015-03-10 DIAGNOSIS — I2510 Atherosclerotic heart disease of native coronary artery without angina pectoris: Secondary | ICD-10-CM | POA: Diagnosis not present

## 2015-03-10 DIAGNOSIS — R197 Diarrhea, unspecified: Secondary | ICD-10-CM | POA: Insufficient documentation

## 2015-03-10 DIAGNOSIS — R05 Cough: Secondary | ICD-10-CM

## 2015-03-10 LAB — COMPREHENSIVE METABOLIC PANEL
ALK PHOS: 63 U/L (ref 38–126)
ALT: 18 U/L (ref 17–63)
AST: 28 U/L (ref 15–41)
Albumin: 3.8 g/dL (ref 3.5–5.0)
Anion gap: 10 (ref 5–15)
BUN: 6 mg/dL (ref 6–20)
CALCIUM: 8.8 mg/dL — AB (ref 8.9–10.3)
CO2: 20 mmol/L — AB (ref 22–32)
CREATININE: 1.05 mg/dL (ref 0.61–1.24)
Chloride: 105 mmol/L (ref 101–111)
GFR calc non Af Amer: >60 mL/min (ref >60)
Glucose, Bld: 94 mg/dL (ref 65–99)
Potassium: 4 mmol/L (ref 3.5–5.1)
SODIUM: 135 mmol/L (ref 135–145)
Total Bilirubin: 0.7 mg/dL (ref 0.3–1.2)
Total Protein: 7.2 g/dL (ref 6.5–8.1)

## 2015-03-10 LAB — CBC
HCT: 45.4 % (ref 39.0–52.0)
Hemoglobin: 14.9 g/dL (ref 13.0–17.0)
MCH: 28.4 pg (ref 26.0–34.0)
MCHC: 32.8 g/dL (ref 30.0–36.0)
MCV: 86.6 fL (ref 78.0–100.0)
PLATELETS: 253 10*3/uL (ref 150–400)
RBC: 5.24 MIL/uL (ref 4.22–5.81)
RDW: 15.7 % — AB (ref 11.5–15.5)
WBC: 5.8 10*3/uL (ref 4.0–10.5)

## 2015-03-10 LAB — URINALYSIS, ROUTINE W REFLEX MICROSCOPIC
GLUCOSE, UA: NEGATIVE mg/dL
KETONES UR: 15 mg/dL — AB
LEUKOCYTES UA: NEGATIVE
NITRITE: NEGATIVE
PROTEIN: 30 mg/dL — AB
Specific Gravity, Urine: 1.029 (ref 1.005–1.030)
pH: 5.5 (ref 5.0–8.0)

## 2015-03-10 LAB — URINE MICROSCOPIC-ADD ON

## 2015-03-10 LAB — LIPASE, BLOOD: Lipase: 20 U/L (ref 11–51)

## 2015-03-10 MED ORDER — KETOROLAC TROMETHAMINE 60 MG/2ML IM SOLN
60.0000 mg | Freq: Once | INTRAMUSCULAR | Status: AC
  Administered 2015-03-10: 60 mg via INTRAMUSCULAR
  Filled 2015-03-10: qty 2

## 2015-03-10 NOTE — ED Notes (Signed)
Patient transported to X-ray 

## 2015-03-10 NOTE — ED Provider Notes (Signed)
CSN: [id number]     Arrival date & time 03/10/15  1446 History   First MD Initiated Contact with Patient 03/10/15 1905     Chief Complaint  Patient presents with  . Cough  . Fever     (Consider location/radiation/quality/duration/timing/severity/associated sxs/prior Treatment) Patient is a 45 y.o. male presenting with cough and fever. The history is provided by the patient.  Cough Cough characteristics:  Productive Sputum characteristics:  Yellow Severity:  Moderate Onset quality:  Gradual Duration:  4 days Timing:  Constant Progression:  Worsening Chronicity:  New Smoker: yes   Context: weather changes   Context: not sick contacts   Relieved by:  Nothing Worsened by:  Activity and smoking Ineffective treatments:  Rest Associated symptoms: chills, fever (subjective), myalgias and shortness of breath   Associated symptoms: no chest pain, no diaphoresis, no ear fullness, no ear pain, no eye discharge, no headaches, no rash, no rhinorrhea, no sore throat, no weight loss and no wheezing   Risk factors: no recent infection and no recent travel   Fever Associated symptoms: chills, cough, diarrhea and myalgias   Associated symptoms: no chest pain, no confusion, no dysuria, no ear pain, no headaches, no nausea, no rash, no rhinorrhea, no sore throat and no vomiting     Past Medical History  Diagnosis Date  . Hypertension   . Tobacco abuse   . Anxiety   . Depression   . Aortic insufficiency     moderate by echo 04/2014  . Coronary artery disease 03/24/2014    NSTEMI s/p PCI of mid LAD and repeat cath with patent stent   Past Surgical History  Procedure Laterality Date  . Left heart catheterization with coronary angiogram Bilateral 03/04/2014    Procedure: LEFT HEART CATHETERIZATION WITH CORONARY ANGIOGRAM;  Surgeon: Kathleene Hazel, MD;  Location: [facility] CATH LAB;  Service: Cardiovascular;  Laterality: Bilateral;  . Left heart catheterization with coronary angiogram N/A  03/26/2014    Procedure: LEFT HEART CATHETERIZATION WITH CORONARY ANGIOGRAM;  Surgeon: Lennette Bihari, MD;  Location[facility] C[facility] LAB;  Service: Cardiovascular;  Laterality: N/A;  . Cardiac catheterization  03/04/2014    PCI of mid LAD   Family History  Problem Relation Age of Onset  . Hypertension Mother   . Hypertension Father   . Hypertension Other   . Diabetes Other   . Heart attack Other    Social History  Substance Use Topics  . Smoking status: Current Every Day Smoker -- 0.00 packs/day for 0 years    Types: Cigarettes  . Smokeless tobacco: Never Used  . Alcohol Use: 0.0 oz/week    0 Standard drinks or equivalent per week     Comment: occ    Review of Systems  Constitutional: Positive for fever (subjective), chills and fatigue. Negative for weight loss and diaphoresis.  HENT: Negative for ear pain, rhinorrhea and sore throat.   Eyes: Negative for discharge.  Respiratory: Positive for cough and shortness of breath. Negative for wheezing.   Cardiovascular: Negative for chest pain.  Gastrointestinal: Positive for diarrhea. Negative for nausea, vomiting and blood in stool.  Genitourinary: Negative for dysuria.  Musculoskeletal: Positive for myalgias.  Skin: Negative for rash.  Neurological: Negative for headaches.  Psychiatric/Behavioral: Negative for confusion.      Allergies  Hydrocodone  Home Medications   Prior to Admission medications   Medication Sig Start Date End Date Taking? Authorizing Provider  aspirin 81 MG EC tablet Take 1 tablet (81 mg total) by  mouth daily. For health health Patient not taking: Reported on 03/10/2015 06/19/14   Sanjuana Kava, NP  atorvastatin (LIPITOR) 80 MG tablet Take 1 tablet (80 mg total) by mouth daily at 6 PM. For high cholesterol Patient not taking: Reported on 03/10/2015 06/19/14   Sanjuana Kava, NP  clopidogrel (PLAVIX) 75 MG tablet Take 1 tablet (75 mg total) by mouth daily. For prevention of blood clot Patient not taking: Reported  on 03/10/2015 06/19/14   Sanjuana Kava, NP  ezetimibe (ZETIA) 10 MG tablet Take 1 tablet (10 mg total) by mouth daily. Patient not taking: Reported on 03/10/2015 10/22/14   Quintella Reichert, MD  hydrochlorothiazide (MICROZIDE) 12.5 MG capsule Take 1 capsule (12.5 mg total) by mouth daily. For high blood pressure Patient not taking: Reported on 03/10/2015 06/19/14   Sanjuana Kava, NP  hydrOXYzine (ATARAX/VISTARIL) 25 MG tablet Take 1 tablet (25 mg total) by mouth 3 (three) times daily with meals as needed for anxiety. Patient not taking: Reported on 03/10/2015 12/17/14   Cleotis Nipper, MD  lamoTRIgine (LAMICTAL) 100 MG tablet Take 1 tablet (100 mg total) by mouth daily. Patient not taking: Reported on 03/10/2015 12/17/14   Cleotis Nipper, MD  metoprolol tartrate (LOPRESSOR) 25 MG tablet Take 0.5 tablets (12.5 mg total) by mouth 2 (two) times daily. For high blood pressure Patient not taking: Reported on 03/10/2015 06/19/14   Sanjuana Kava, NP  pantoprazole (PROTONIX) 40 MG tablet Take 1 tablet (40 mg total) by mouth daily. For acid reflux Patient not taking: Reported on 03/10/2015 06/19/14   Sanjuana Kava, NP  traZODone (DESYREL) 50 MG tablet Take 1 tablet (50 mg total) by mouth at bedtime as needed for sleep. Patient not taking: Reported on 03/10/2015 11/19/14   Cleotis Nipper, MD   BP 152/83 mmHg  Pulse 60  Temp(Src) 99.5 F (37.5 C) (Oral)  Resp 12  SpO2 98% Physical Exam  Constitutional: He is oriented to person, place, and time. He appears well-developed and well-nourished. No distress.  HENT:  Head: Normocephalic and atraumatic.  Mouth/Throat: Mucous membranes are normal. No uvula swelling. No oropharyngeal exudate, posterior oropharyngeal edema, posterior oropharyngeal erythema or tonsillar abscesses.  Eyes: Conjunctivae and EOM are normal. Pupils are equal, round, and reactive to light.  Neck: Normal range of motion. Neck supple.  Abdominal: Soft. He exhibits no distension and no mass. There is no  tenderness. There is no rebound and no guarding.  Musculoskeletal: Normal range of motion.  Neurological: He is alert and oriented to person, place, and time. No cranial nerve deficit. Coordination normal.  Skin: Skin is warm and dry. No rash noted. He is not diaphoretic. No erythema.  Psychiatric: He has a normal mood and affect. His behavior is normal. Thought content normal.    ED Course  Procedures (including critical care time) Labs Review Labs Reviewed  COMPREHENSIVE METABOLIC PANEL - Abnormal; Notable for the following:    CO2 20 (*)    Calcium 8.8 (*)    All other components within normal limits  CBC - Abnormal; Notable for the following:    RDW 15.7 (*)    All other components within normal limits  LIPASE, BLOOD  URINALYSIS, ROUTINE W REFLEX MICROSCOPIC (NOT AT [facility])    Imaging Review Dg Chest 2 View  03/10/2015  CLINICAL DATA:  Repeat chest x-ray from earlier today. EXAM: CHEST  2 VIEW COMPARISON:  Earlier film, same date FINDINGS: The cardiac silhouette, mediastinal and hilar  contours are normal. The lungs are clear. No pleural effusion or pulmonary lesions. The bony thorax is intact. IMPRESSION: Normal chest x-ray. Electronically Signed   By: Rudie MeyerP.  Gallerani M.D.   On: 03/10/2015 19:33   Dg Chest 2 View  03/10/2015  CLINICAL DATA:  Cough, fever, flu like symptoms since Sunday, headache, diarrhea, body aches, shortness of breath, sharp LEFT-sided chest pain, nausea, fever, history coronary artery disease post MI, smoker, hypertension EXAM: CHEST  2 VIEW COMPARISON:  10/20/2014 FINDINGS: Normal heart size, mediastinal contours, and pulmonary vascularity. Artifacts from the patient's clothing or hair project over the lungs bilaterally. Lungs grossly clear. No definite infiltrate, pleural effusion, pneumothorax. Questionable 10 mm nodular density RIGHT mid lung versus artifact from patient's hair. Bones unremarkable. IMPRESSION: No acute infiltrate. BILATERAL artifacts at the lungs  likely related to clothing or hair with questionable 10 mm nodular density versus artifact at the RIGHT mid lung; repeat PA chest radiograph without shirt or hair within imaged field recommended to exclude pulmonary nodule. Electronically Signed   By: Ulyses SouthwardMark  Boles M.D.   On: 03/10/2015 15:55   I have personally reviewed and evaluated these images and lab results as part of my medical decision-making.   EKG Interpretation   Date/Time:  Wednesday March 10 2015 20:13:55 EST Ventricular Rate:  74 PR Interval:  179 QRS Duration: 87 QT Interval:  399 QTC Calculation: 443 R Axis:   64 Text Interpretation:  Sinus rhythm LAE, consider biatrial enlargement Left  ventricular hypertrophy Repol abnrm suggests ischemia, inferior leads st  elevation anteriorly No significant change since last tracing Confirmed by  KNAPP  MD-J, JON ([id number]) on 03/10/2015 8:45:47 PM      MDM   Final diagnoses:  Productive cough  Fever, unspecified fever cause  Myalgia  Chills  Influenza-like illness  Diarrhea, unspecified type    45 year old black male with past medical history of MI presents in the setting of myalgias, subjective fever, productive cough, headache and diarrhea. Patient reports symptoms have been progressively getting worse over the last 4 days. He reports due to continued symptoms he presented today. On arrival patient was hemodynamically stable and afebrile. Patient's lungs were clear to auscultation bilaterally patient was in no acute distress. Patient denied taking temperature during this time and reported fevers were subjective.  In setting these findings this is likely influenza-like illness however due to diarrhea and symptoms we'll obtain laboratory analysis as well as chest x-ray and EKG. We'll give patient Toradol and will reassess afterwards.  Chest x-ray revealed no acute cardiopulmonary abnormality. No signs of obvious pneumonia. No signs of pancreatitis or significant electrolyte  abnormality's. No elevation in liver function. No acute kidney injury and EKG unchanged from previous without signs of acute ischemia. On reassessment patient reported significant improvement in symptoms. In setting this findings is likely influenza-like illness. Will discharge patient home with plan to follow PCP in 2-3 days for reevaluation if symptoms are not improving. Patient stable at time of discharge and in agreement with plan.  Attending has seen and evaluated patient and Dr. Lynelle DoctorKnapp is in agreement with plan.    Stacy GardnerAndrew Jermany Sundell, MD 03/10/15 2049  Linwood DibblesJon Knapp, MD 03/11/15 Marlyne Beards0002

## 2015-03-10 NOTE — Discharge Instructions (Signed)
Fever, Adult A fever is an increase in the body's temperature. It is usually defined as a temperature of 100F (38C) or higher. Brief mild or moderate fevers generally have no long-term effects, and they often do not require treatment. Moderate or high fevers may make you feel uncomfortable and can sometimes be a sign of a serious illness or disease. The sweating that may occur with repeated or prolonged fever may also cause dehydration. Fever is confirmed by taking a temperature with a thermometer. A measured temperature can vary with:  Age.  Time of day.  Location of the thermometer:  Mouth (oral).  Rectum (rectal).  Ear (tympanic).  Underarm (axillary).  Forehead (temporal). HOME CARE INSTRUCTIONS Pay attention to any changes in your symptoms. Take these actions to help with your condition:  Take over-the counter and prescription medicines only as told by your health care provider. Follow the dosing instructions carefully.  If you were prescribed an antibiotic medicine, take it as told by your health care provider. Do not stop taking the antibiotic even if you start to feel better.  Rest as needed.  Drink enough fluid to keep your urine clear or pale yellow. This helps to prevent dehydration.  Sponge yourself or bathe with room-temperature water to help reduce your body temperature as needed. Do not use ice water.  Do not overbundle yourself in blankets or heavy clothes. SEEK MEDICAL CARE IF:  You vomit.  You cannot eat or drink without vomiting.  You have diarrhea.  You have pain when you urinate.  Your symptoms do not improve with treatment.  You develop new symptoms.  You develop excessive weakness. SEEK IMMEDIATE MEDICAL CARE IF:  You have shortness of breath or have trouble breathing.  You are dizzy or you faint.  You are disoriented or confused.  You develop signs of dehydration, such as a dry mouth, decreased urination, or paleness.  You develop  severe pain in your abdomen.  You have persistent vomiting or diarrhea.  You develop a skin rash.  Your symptoms suddenly get worse.   This information is not intended to replace advice given to you by your health care provider. Make sure you discuss any questions you have with your health care provider.   Document Released: 08/09/2000 Document Revised: 11/04/2014 Document Reviewed: 04/09/2014 Elsevier Interactive Patient Education 2016 Else[employer] Inc.  Influenza, Adult Influenza ("the flu") is a viral infection of the respiratory tract. It occurs more often in winter months because people spend more time in close contact with one another. Influenza can make you feel very sick. Influenza easily spreads from person to person (contagious). CAUSES  Influenza is caused by a virus that infects the respiratory tract. You can catch the virus by breathing in droplets from an infected person's cough or sneeze. You can also catch the virus by touching something that was recently contaminated with the virus and then touching your mouth, nose, or eyes. RISKS AND COMPLICATIONS You may be at risk for a more severe case of influenza if you smoke cigarettes, have diabetes, have chronic heart disease (such as heart failure) or lung disease (such as asthma), or if you have a weakened immune system. Elderly people and pregnant women are also at risk for more serious infections. The most common problem of influenza is a lung infection (pneumonia). Sometimes, this problem can require emergency medical care and may be life threatening. SIGNS AND SYMPTOMS  Symptoms typically last 4 to 10 days and may include:  Fever.  Chills.  Headache, body aches, and muscle aches.  Sore throat.  Chest discomfort and cough.  Poor appetite.  Weakness or feeling tired.  Dizziness.  Nausea or vomiting. DIAGNOSIS  Diagnosis of influenza is often made based on your history and a physical exam. A nose or throat swab test  can be done to confirm the diagnosis. TREATMENT  In mild cases, influenza goes away on its own. Treatment is directed at relieving symptoms. For more severe cases, your health care provider may prescribe antiviral medicines to shorten the sickness. Antibiotic medicines are not effective because the infection is caused by a virus, not by bacteria. HOME CARE INSTRUCTIONS  Take medicines only as directed by your health care provider.  Use a cool mist humidifier to make breathing easier.  Get plenty of rest until your temperature returns to normal. This usually takes 3 to 4 days.  Drink enough fluid to keep your urine clear or pale yellow.  Cover yourmouth and nosewhen coughing or sneezing,and wash your handswellto prevent thevirusfrom spreading.  Stay homefromwork orschool untilthe fever is gonefor at least 221full day. PREVENTION  An annual influenza vaccination (flu shot) is the best way to avoid getting influenza. An annual flu shot is now routinely recommended for all adults in the [place] SEEK MEDICAL CARE IF:  You experiencechest pain, yourcough worsens,or you producemore mucus.  Youhave nausea,vomiting, ordiarrhea.  Your fever returns or gets worse. SEEK IMMEDIATE MEDICAL CARE IF:  You havetrouble breathing, you become short of breath,or your skin ornails becomebluish.  You have severe painor stiffnessin the neck.  You develop a sudden headache, or pain in the face or ear.  You have nausea or vomiting that you cannot control. MAKE SURE YOU:   Understand these instructions.  Will watch your condition.  Will get help right away if you are not doing well or get worse.   This information is not intended to replace advice given to you by your health care provider. Make sure you discuss any questions you have with your health care provider.   Document Released: 02/11/2000 Document Revised: 03/06/2014 Document Reviewed: 05/15/2011 Elsevier Interactive  Patient Education 2016 Elsevier Inc.  Viral Infections A viral infection can be caused by different types of viruses.Most viral infections are not serious and resolve on their own. However, some infections may cause severe symptoms and may lead to further complications. SYMPTOMS Viruses can frequently cause:  Minor sore throat.  Aches and pains.  Headaches.  Runny nose.  Different types of rashes.  Watery eyes.  Tiredness.  Cough.  Loss of appetite.  Gastrointestinal infections, resulting in nausea, vomiting, and diarrhea. These symptoms do not respond to antibiotics because the infection is not caused by bacteria. However, you might catch a bacterial infection following the viral infection. This is sometimes called a "superinfection." Symptoms of such a bacterial infection may include:  Worsening sore throat with pus and difficulty swallowing.  Swollen neck glands.  Chills and a high or persistent fever.  Severe headache.  Tenderness over the sinuses.  Persistent overall ill feeling (malaise), muscle aches, and tiredness (fatigue).  Persistent cough.  Yellow, green, or brown mucus production with coughing. HOME CARE INSTRUCTIONS   Only take over-the-counter or prescription medicines for pain, discomfort, diarrhea, or fever as directed by your caregiver.  Drink enough water and fluids to keep your urine clear or pale yellow. Sports drinks can provide valuable electrolytes, sugars, and hydration.  Get plenty of rest and maintain proper nutrition. Soups and broths with crackers or  rice are fine. SEEK IMMEDIATE MEDICAL CARE IF:   You have severe headaches, shortness of breath, chest pain, neck pain, or an unusual rash.  You have uncontrolled vomiting, diarrhea, or you are unable to keep down fluids.  You or your child has an oral temperature above 102 F (38.9 C), not controlled by medicine.  Your baby is older than 3 months with a rectal temperature of 102  F (38.9 C) or higher.  Your baby is 28 months old or younger with a rectal temperature of 100.4 F (38 C) or higher. MAKE SURE YOU:   Understand these instructions.  Will watch your condition.  Will get help right away if you are not doing well or get worse.   This information is not intended to replace advice given to you by your health care provider. Make sure you discuss any questions you have with your health care provider.   Document Released: 11/23/2004 Document Revised: 05/08/2011 Document Reviewed: 07/22/2014 Elsevier Interactive Patient Education [employer]

## 2015-03-10 NOTE — ED Notes (Addendum)
Pt reports having flu like symptoms since Sunday morning. Having bodyaches, fever, productive cough, headache. Also reports recent diarrhea. Mask on pt at triage.

## 2015-03-10 NOTE — ED Notes (Signed)
Patient is alert and orientedx4.  Patient was explained discharge instructions and they understood them with no questions.   

## 2015-03-19 ENCOUNTER — Ambulatory Visit (HOSPITAL_COMMUNITY): Payer: Self-pay | Admitting: Psychiatry

## 2015-03-31 ENCOUNTER — Ambulatory Visit (HOSPITAL_COMMUNITY): Payer: Self-pay | Admitting: Psychiatry

## 2015-04-14 ENCOUNTER — Encounter (HOSPITAL_COMMUNITY): Payer: Self-pay | Admitting: Clinical

## 2015-04-14 ENCOUNTER — Ambulatory Visit (INDEPENDENT_AMBULATORY_CARE_PROVIDER_SITE_OTHER): Payer: BLUE CROSS/BLUE SHIELD | Admitting: Clinical

## 2015-04-14 DIAGNOSIS — F331 Major depressive disorder, recurrent, moderate: Secondary | ICD-10-CM | POA: Diagnosis not present

## 2015-04-14 DIAGNOSIS — F411 Generalized anxiety disorder: Secondary | ICD-10-CM
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
## 2015-04-20 ENCOUNTER — Ambulatory Visit (INDEPENDENT_AMBULATORY_CARE_PROVIDER_SITE_OTHER): Payer: Self-pay | Admitting: Psychiatry

## 2015-04-20 ENCOUNTER — Encounter (HOSPITAL_COMMUNITY): Payer: Self-pay | Admitting: Psychiatry

## 2015-04-20 VITALS — BP 169/97 | HR 84 | Ht 66.0 in | Wt 159.6 lb

## 2015-04-20 DIAGNOSIS — F331 Major depressive disorder, recurrent, moderate: Secondary | ICD-10-CM

## 2015-04-20 DIAGNOSIS — F1010 Alcohol abuse, uncomplicated: Secondary | ICD-10-CM

## 2015-04-20 MED ORDER — HYDROXYZINE HCL 25 MG PO TABS: 25.0000 mg | ORAL_TABLET | Freq: Three times a day (TID) | ORAL | Status: DC | PRN

## 2015-04-20 MED ORDER — LAMOTRIGINE 150 MG PO TABS: 150.0000 mg | ORAL_TABLET | Freq: Every day | ORAL | Status: DC

## 2015-04-20 NOTE — Progress Notes (Signed)
[name] [id number] Progress Note  Darren [name] [id number] 45 y.o.  04/20/2015 4:53 PM  Chief Complaint:  I am fired from job.  I'm looking for a new job.  Medicine helping me    History of Present Illness:  Darren [name] came for his follow-up appointment.  He apologized missing his last appointment.  He was last seen in October.  He was taking Lamictal and recently he ran out .  He also not taking his blood pressure medication until yesterday .  Today his blood pressure is high .  He denies any chest pain, shortness of breath, palpitation or any headaches.  He admitted very stressed out because he lost his job.  Patient told he was fired because he was not doing his job 1 time.  However he is looking for another job.  He was working in a [employer].  He admitted taking the medication helping him.  He denies any irritability or any anger.  He is taking Lamictal and denies any rash or itching.  He's taking Vistaril 35 mg 3 times a day.  His sleep is good and he does not require trazodone.  Lately he has noticed that he's more depressed isolated withdrawn and wondering if the dose can be increased.  He started seeing Darren [name] for counseling.  Patient denies any paranoia or any hallucination.  He admitted cutting down his drinking and smoking marijuana since he is taking the medication.  His appetite is okay.  His blood pressure is high .  He promises he will continue his medication .  He was recently seen in the emergency room because of fever and he finished antibiotic.  Suicidal Ideation: No Plan Formed: No Patient has means to carry out plan: No  Homicidal Ideation: No Plan Formed: No Patient has means to carry out plan: No  Past Psychiatric History/Hospitalization; Patient has at least 3 psychiatric hospitalization which is usually complicated by alcohol use, relationship issues and severe depression.  Though he has no suicidal attempt but admitted suicidal thoughts and plan when  he is severely depressed.  He had tried Lexapro, Valium in the past.  He has been arrested for drug charges. Currently he is not in any probation. Anxiety: Yes Bipolar Disorder: No Depression: Yes Mania: No Psychosis: No Schizophrenia: No Personality Disorder: No Hospitalization for psychiatric illness: Yes History of Electroconvulsive Shock Therapy: No Prior Suicide Attempts: No  Medical History; Patient has hypertension, coronary artery disease and status post stent .    Social History; Patient is raised by his mother and stepfather.  He has not seen his father in many years.  He has 5 children.  He has 91 year old son with his first relationship than he has 3 children age 38, 64 and 60 from his ex-wife and he has 66-year-old from his last relationship.  He has a brother who lives in [place].    Family History; Patient is unknown if his family member has any depression or psychiatric illness.    Substance Abuse History; Patient admitted history of drinking alcohol, smoking marijuana and using cocaine in the past.  He claims to be sober from marijuana and cocaine but admitted still drinking on and off.  He denies any binge and this time he does not have any withdrawal symptoms.   Review of Systems  Constitutional: Negative.  Negative for weight loss and malaise/fatigue.  Cardiovascular: Negative.  Negative for chest pain and palpitations.  Musculoskeletal: Negative.  Negative for myalgias, back pain and neck  pain.  Skin: Negative for itching and rash.  Neurological: Negative for dizziness, tingling, tremors and headaches.  Psychiatric/Behavioral: Positive for depression and substance abuse.       Irritability    Psychiatric: Agitation: No Hallucination: No Depressed Mood: Yes Insomnia: No Hypersomnia: No Altered Concentration: No Feels Worthless: No Grandiose Ideas: No Belief In Special Powers: No New/Increased Substance Abuse: No Compulsions:  No  Neurologic: Headache: No Seizure: No Paresthesias: No   Outpatient Encounter Prescriptions as of 04/20/2015  Medication Sig  . aspirin 81 MG EC tablet Take 1 tablet (81 mg total) by mouth daily. For health health  . atorvastatin (LIPITOR) 80 MG tablet Take 1 tablet (80 mg total) by mouth daily at 6 PM. For high cholesterol  . clopidogrel (PLAVIX) 75 MG tablet Take 1 tablet (75 mg total) by mouth daily. For prevention of blood clot  . ezetimibe (ZETIA) 10 MG tablet Take 1 tablet (10 mg total) by mouth daily.  . hydrochlorothiazide (MICROZIDE) 12.5 MG capsule Take 1 capsule (12.5 mg total) by mouth daily. For high blood pressure  . hydrOXYzine (ATARAX/VISTARIL) 25 MG tablet Take 1 tablet (25 mg total) by mouth 3 (three) times daily with meals as needed for anxiety.  . lamoTRIgine (LAMICTAL) 150 MG tablet Take 1 tablet (150 mg total) by mouth daily.  . metoprolol tartrate (LOPRESSOR) 25 MG tablet Take 0.5 tablets (12.5 mg total) by mouth 2 (two) times daily. For high blood pressure  . pantoprazole (PROTONIX) 40 MG tablet Take 1 tablet (40 mg total) by mouth daily. For acid reflux  . traZODone (DESYREL) 50 MG tablet Take 1 tablet (50 mg total) by mouth at bedtime as needed for sleep.  . [DISCONTINUED] hydrOXYzine (ATARAX/VISTARIL) 25 MG tablet Take 1 tablet (25 mg total) by mouth 3 (three) times daily with meals as needed for anxiety.  . [DISCONTINUED] lamoTRIgine (LAMICTAL) 100 MG tablet Take 1 tablet (100 mg total) by mouth daily.   No facility-administered encounter medications on file as of 04/20/2015.    Recent Results (from the past 2160 hour(s))  Lipase, blood     Status: None   Collection Time: 03/10/15  3:22 PM  Result Value Ref Range   Lipase 20 11 - 51 U/L  Comprehensive metabolic panel     Status: Abnormal   Collection Time: 03/10/15  3:22 PM  Result Value Ref Range   Sodium 135 135 - 145 mmol/L   Potassium 4.0 3.5 - 5.1 mmol/L   Chloride 105 101 - 111 mmol/L   CO2 20  (L) 22 - 32 mmol/L   Glucose, Bld 94 65 - 99 mg/dL   BUN 6 6 - 20 mg/dL   Creatinine, Ser 1.05 0.61 - 1.24 mg/dL   Calcium 8.8 (L) 8.9 - 10.3 mg/dL   Total Protein 7.2 6.5 - 8.1 g/dL   Albumin 3.8 3.5 - 5.0 g/dL   AST 28 15 - 41 U/L   ALT 18 17 - 63 U/L   Alkaline Phosphatase 63 38 - 126 U/L   Total Bilirubin 0.7 0.3 - 1.2 mg/dL   GFR calc non Af Amer >60 >60 mL/min   GFR calc Af Amer >60 >60 mL/min    Comment: (NOTE) The eGFR has been calculated using the CKD EPI equation. This calculation has not been validated in all clinical situations. eGFR's persistently <60 mL/min signify possible Chronic Kidney Disease.    Anion gap 10 5 - 15  CBC     Status: Abnormal   Collection  Time: 03/10/15  3:22 PM  Result Value Ref Range   WBC 5.8 4.0 - 10.5 K/uL   RBC 5.24 4.22 - 5.81 MIL/uL   Hemoglobin 14.9 13.0 - 17.0 g/dL   HCT 45.4 39.0 - 52.0 %   MCV 86.6 78.0 - 100.0 fL   MCH 28.4 26.0 - 34.0 pg   MCHC 32.8 30.0 - 36.0 g/dL   RDW 15.7 (H) 11.5 - 15.5 %   Platelets 253 150 - 400 K/uL  Urinalysis, Routine w reflex microscopic (not at [facility])     Status: Abnormal   Collection Time: 03/10/15  8:38 PM  Result Value Ref Range   Color, Urine AMBER (A) YELLOW    Comment: BIOCHEMICALS MAY BE AFFECTED BY COLOR   APPearance CLEAR CLEAR   Specific Gravity, Urine 1.029 1.005 - 1.030   pH 5.5 5.0 - 8.0   Glucose, UA NEGATIVE NEGATIVE mg/dL   Hgb urine dipstick MODERATE (A) NEGATIVE   Bilirubin Urine SMALL (A) NEGATIVE   Ketones, ur 15 (A) NEGATIVE mg/dL   Protein, ur 30 (A) NEGATIVE mg/dL   Nitrite NEGATIVE NEGATIVE   Leukocytes, UA NEGATIVE NEGATIVE  Urine microscopic-add on     Status: Abnormal   Collection Time: 03/10/15  8:38 PM  Result Value Ref Range   Squamous Epithelial / LPF 0-5 (A) NONE SEEN   WBC, UA 0-5 0 - 5 WBC/hpf   RBC / HPF 6-30 0 - 5 RBC/hpf   Bacteria, UA RARE (A) NONE SEEN   Urine-Other MUCOUS PRESENT       Constitutional:  BP 169/97 mmHg  Pulse 84  Ht 5' 6"   (1.676 m)  Wt 159 lb 9.6 oz (72.394 kg)  BMI 25.77 kg/m2   Musculoskeletal: Strength & Muscle Tone: within normal limits Gait & Station: normal Patient leans: N/A  Psychiatric Specialty Exam: General Appearance: Casual  Eye Contact::  Fair  Speech:  Slow  Volume:  Decreased  Mood:  Depressed  Affect:  Constricted  Thought Process:  Coherent and Linear  Orientation:  Full (Time, Place, and Person)  Thought Content:  Rumination  Suicidal Thoughts:  No  Homicidal Thoughts:  No  Memory:  Immediate;   Fair Recent;   Fair Remote;   Fair  Judgement:  Fair  Insight:  Fair  Psychomotor Activity:  Decreased  Concentration:  Fair  Recall:  [employer] of Knowledge:  Fair  Language:  Fair  Akathisia:  No  Handed:  Right  AIMS (if indicated):     Assets:  Communication Skills Desire for Improvement Housing Social Support  ADL's:  Intact  Cognition:  WNL  Sleep:        Established Problem, Stable/Improving (1), Review of Psycho-Social Stressors (1), Review and summation of old records (2), Established Problem, Worsening (2), Review of Last Therapy Session (1), Review of Medication Regimen & Side Effects (2) and Review of New Medication or Change in Dosage (2)  Assessment: Axis Alcohol dependence, Major depressive disorder, recurrent.  Rule out bipolar disorder depressed type  Axis II: Deferred   Axis III:  Past Medical History  Diagnosis Date  . Hypertension   . Tobacco abuse   . Anxiety   . Depression   . Aortic insufficiency     moderate by echo 04/2014  . Coronary artery disease 03/24/2014    NSTEMI s/p PCI of mid LAD and repeat cath with patent stent     Plan:  Discussed noncompliance with medication and follow-up appointments.  His  blood pressure is slightly increased due to noncompliant with blood pressure medication.  Patient promised that he will continue his medication since it is helping him.  I would increase Lamictal 150 mg daily.  I reviewed his  psychosocial stressors, blood work, current medication and collateral information from ER visit.  Continue Vistaril 25 mg 3 times a day.  He sleeping good and he does not need a new prescription of trazodone.  Encouraged to keep appointment with Darren [name] for coping and social skills.  Reinforce medication side effects especially rash and in that case he need to call immediately and to stop the medication.  Discuss safety plan that anytime having active suicidal thoughts or homicidal thoughts and he need to call 911 or go to the local emergency room.  Follow-up in 8 weeks.   Florenda Watt T., MD 04/20/2015

## 2015-04-21 ENCOUNTER — Ambulatory Visit (HOSPITAL_COMMUNITY): Payer: Self-pay | Admitting: Clinical

## 2015-04-27 ENCOUNTER — Encounter: Payer: Self-pay | Admitting: Physician Assistant

## 2015-04-27 NOTE — Progress Notes (Deleted)
Cardiology Office Note   Date:  04/27/2015   ID:  [name], DOB January 02, 1971, MRN [id number]  PCP:  REDMON,NOELLE, PA-C  Cardiologist:  Dr Orpah Cobb, PA-C   No chief complaint on file.   History of Present Illness: Darren [name] is a 45 y.o. male with a history of HTN and CAD s/p DES to mLAD (02/2014), depression, remote drug use, still w/ occ ETOH use. EF 02/2014 w/ EF 60-65%, severe concentric LVH, normal wall motion, diastolicdysfunction, normal LV filling pressure, moderate AI, trivial MR.Cath at that time w/ patent stent, no other CAD.   Darren [name] presents for ***   Past Medical History  Diagnosis Date  . Hypertension   . Tobacco abuse   . Anxiety   . Depression   . Aortic insufficiency     moderate by echo 04/2014  . Coronary artery disease 03/24/2014    NSTEMI s/p PCI of mid LAD and repeat cath with patent stent    Past Surgical History  Procedure Laterality Date  . Left heart catheterization with coronary angiogram Bilateral 03/04/2014    Procedure: LEFT HEART CATHETERIZATION WITH CORONARY ANGIOGRAM;  Surgeon: Kathleene Hazel, MD;  Location: [facility] CATH LAB;  Service: Cardiovascular;  Laterality: Bilateral;  . Left heart catheterization with coronary angiogram N/A 03/26/2014    Procedure: LEFT HEART CATHETERIZATION WITH CORONARY ANGIOGRAM;  Surgeon: Lennette Bihari, MD;  Location: [facility] CATH LAB;  Service: Cardiovascular;  Laterality: N/A;  . Cardiac catheterization  03/04/2014    PCI of mid LAD    Current Outpatient Prescriptions  Medication Sig Dispense Refill  . aspirin 81 MG EC tablet Take 1 tablet (81 mg total) by mouth daily. For health health 30 tablet 12  . atorvastatin (LIPITOR) 80 MG tablet Take 1 tablet (80 mg total) by mouth daily at 6 PM. For high cholesterol 30 tablet 5  . clopidogrel (PLAVIX) 75 MG tablet Take 1 tablet (75 mg total) by mouth daily. For prevention of blood clot 30 tablet 5  . ezetimibe (ZETIA) 10 MG tablet  Take 1 tablet (10 mg total) by mouth daily. 30 tablet 11  . hydrochlorothiazide (MICROZIDE) 12.5 MG capsule Take 1 capsule (12.5 mg total) by mouth daily. For high blood pressure 30 capsule 5  . hydrOXYzine (ATARAX/VISTARIL) 25 MG tablet Take 1 tablet (25 mg total) by mouth 3 (three) times daily with meals as needed for anxiety. 90 tablet 1  . lamoTRIgine (LAMICTAL) 150 MG tablet Take 1 tablet (150 mg total) by mouth daily. 30 tablet 1  . metoprolol tartrate (LOPRESSOR) 25 MG tablet Take 0.5 tablets (12.5 mg total) by mouth 2 (two) times daily. For high blood pressure    . pantoprazole (PROTONIX) 40 MG tablet Take 1 tablet (40 mg total) by mouth daily. For acid reflux 30 tablet 0  . traZODone (DESYREL) 50 MG tablet Take 1 tablet (50 mg total) by mouth at bedtime as needed for sleep. 30 tablet 0   No current facility-administered medications for this visit.    Allergies:   Hydrocodone    Social History:  The patient  reports that he has been smoking Cigarettes.  He has been smoking about 0.00 packs per day for the past 0 years. He has never used smokeless tobacco. He reports that he drinks alcohol. He reports that he uses illicit drugs (Marijuana).   Family History:  The patient's family history includes Alcohol abuse in his father; Diabetes in his other; Heart  attack in his other; Hypertension in his father, mother, and other.    ROS:  Please see the history of present illness. All other systems are reviewed and negative.    PHYSICAL EXAM: VS:  There were no vitals taken for this visit. , BMI There is no weight on file to calculate BMI. GEN: Well nourished, well developed, male in no acute distress HEENT: normal for age  Neck: no JVD, no carotid bruit, no masses Cardiac: RRR; no murmur, no rubs, or gallops Respiratory:  clear to auscultation bilaterally, normal work of breathing GI: soft, nontender, nondistended, + BS MS: no deformity or atrophy; no edema; distal pulses are 2+ in all 4  extremities  Skin: warm and dry, no rash Neuro:  Strength and sensation are intact Psych: euthymic mood, full affect   EKG:  EKG {ACTION; IS/IS VOZ:[id number]} ordered today. The ekg ordered today demonstrates ***   Recent Labs: 10/22/2014: TSH 0.45 03/10/2015: ALT 18; BUN 6; Creatinine, Ser 1.05; Hemoglobin 14.9; Platelets 253; Potassium 4.0; Sodium 135    Lipid Panel    Component Value Date/Time   CHOL 182 10/22/2014 1134   TRIG 107.0 10/22/2014 1134   HDL 49.80 10/22/2014 1134   CHOLHDL 4 10/22/2014 1134   VLDL 21.4 10/22/2014 1134   LDLCALC 110* 10/22/2014 1134     Wt Readings from Last 3 Encounters:  04/20/15 159 lb 9.6 oz (72.394 kg)  12/17/14 164 lb 8 oz (74.617 kg)  11/19/14 166 lb (75.297 kg)     Other studies Reviewed: Additional studies/ records that were reviewed today include: ***.  ASSESSMENT AND PLAN:  1.  ***   Current medicines are reviewed at length with the patient today.  The patient {ACTIONS; HAS/DOES NOT HAVE:19233} concerns regarding medicines.  The following changes have been made:  {PLAN; NO CHANGE:13088:s}  Labs/ tests ordered today include: *** No orders of the defined types were placed in this encounter.     Disposition:   FU with ***  Signed, Theodore Demark, PA-C  04/27/2015 11:02 AM    [facility] Phone: [phone]; Fax: [phone]  This note was written with the assistance of speech recognition software. Please excuse any transcriptional errors.

## 2015-04-28 ENCOUNTER — Ambulatory Visit (HOSPITAL_COMMUNITY): Payer: Self-pay | Admitting: Clinical

## 2015-04-29 ENCOUNTER — Encounter: Payer: Self-pay | Admitting: Physician Assistant

## 2015-06-21 NOTE — Progress Notes (Signed)
This encounter was created in error - please disregard.

## 2015-06-22 ENCOUNTER — Ambulatory Visit (HOSPITAL_COMMUNITY): Payer: Self-pay | Admitting: Psychiatry

## 2015-07-07 ENCOUNTER — Ambulatory Visit (HOSPITAL_COMMUNITY): Payer: Self-pay | Admitting: Psychiatry

## 2015-10-06 ENCOUNTER — Encounter (HOSPITAL_COMMUNITY): Payer: Self-pay | Admitting: *Deleted

## 2015-10-06 ENCOUNTER — Emergency Department (HOSPITAL_COMMUNITY): Payer: Self-pay

## 2015-10-06 ENCOUNTER — Emergency Department (HOSPITAL_COMMUNITY)
Admission: EM | Admit: 2015-10-06 | Discharge: 2015-10-06 | Disposition: A | Discharge status: Home / Self Care | Payer: Self-pay | Attending: Emergency Medicine | Admitting: Emergency Medicine

## 2015-10-06 DIAGNOSIS — Z7982 Long term (current) use of aspirin: Secondary | ICD-10-CM | POA: Insufficient documentation

## 2015-10-06 DIAGNOSIS — R0789 Other chest pain: Secondary | ICD-10-CM | POA: Insufficient documentation

## 2015-10-06 DIAGNOSIS — Z79899 Other long term (current) drug therapy: Secondary | ICD-10-CM | POA: Insufficient documentation

## 2015-10-06 DIAGNOSIS — Z9114 Patient's other noncompliance with medication regimen: Secondary | ICD-10-CM | POA: Insufficient documentation

## 2015-10-06 DIAGNOSIS — F17210 Nicotine dependence, cigarettes, uncomplicated: Secondary | ICD-10-CM | POA: Insufficient documentation

## 2015-10-06 DIAGNOSIS — G8929 Other chronic pain: Secondary | ICD-10-CM | POA: Insufficient documentation

## 2015-10-06 DIAGNOSIS — I2510 Atherosclerotic heart disease of native coronary artery without angina pectoris: Secondary | ICD-10-CM | POA: Insufficient documentation

## 2015-10-06 DIAGNOSIS — R109 Unspecified abdominal pain: Secondary | ICD-10-CM | POA: Insufficient documentation

## 2015-10-06 DIAGNOSIS — I119 Hypertensive heart disease without heart failure: Secondary | ICD-10-CM | POA: Insufficient documentation

## 2015-10-06 DIAGNOSIS — I252 Old myocardial infarction: Secondary | ICD-10-CM | POA: Insufficient documentation

## 2015-10-06 LAB — I-STAT TROPONIN, ED
TROPONIN I, POC: 0.01 ng/mL (ref 0.00–0.08)
Troponin i, poc: 0.02 ng/mL (ref 0.00–0.08)

## 2015-10-06 LAB — URINALYSIS, ROUTINE W REFLEX MICROSCOPIC
BILIRUBIN URINE: NEGATIVE
GLUCOSE, UA: NEGATIVE mg/dL
HGB URINE DIPSTICK: NEGATIVE
Ketones, ur: NEGATIVE mg/dL
Leukocytes, UA: NEGATIVE
Nitrite: NEGATIVE
PROTEIN: NEGATIVE mg/dL
Specific Gravity, Urine: 1.024 (ref 1.005–1.030)
pH: 6 (ref 5.0–8.0)

## 2015-10-06 LAB — CBC
HCT: 42.1 % (ref 39.0–52.0)
Hemoglobin: 14 g/dL (ref 13.0–17.0)
MCH: 29.3 pg (ref 26.0–34.0)
MCHC: 33.3 g/dL (ref 30.0–36.0)
MCV: 88.1 fL (ref 78.0–100.0)
PLATELETS: 278 10*3/uL (ref 150–400)
RBC: 4.78 MIL/uL (ref 4.22–5.81)
RDW: 14.6 % (ref 11.5–15.5)
WBC: 6.8 10*3/uL (ref 4.0–10.5)

## 2015-10-06 LAB — BASIC METABOLIC PANEL
Anion gap: 6 (ref 5–15)
BUN: 11 mg/dL (ref 6–20)
CALCIUM: 8.8 mg/dL — AB (ref 8.9–10.3)
CHLORIDE: 104 mmol/L (ref 101–111)
CO2: 25 mmol/L (ref 22–32)
CREATININE: 1.05 mg/dL (ref 0.61–1.24)
Glucose, Bld: 93 mg/dL (ref 65–99)
Potassium: 3.7 mmol/L (ref 3.5–5.1)
SODIUM: 135 mmol/L (ref 135–145)

## 2015-10-06 LAB — LIPASE, BLOOD: LIPASE: 20 U/L (ref 11–51)

## 2015-10-06 MED ORDER — PANTOPRAZOLE SODIUM 20 MG PO TBEC: 40.0000 mg | DELAYED_RELEASE_TABLET | Freq: Every day | ORAL | 0 refills | Status: DC

## 2015-10-06 MED ORDER — EZETIMIBE 10 MG PO TABS: 10.0000 mg | ORAL_TABLET | Freq: Every day | ORAL | 0 refills | Status: DC

## 2015-10-06 MED ORDER — EZETIMIBE 10 MG PO TABS
10.0000 mg | ORAL_TABLET | Freq: Every day | ORAL | Status: DC
  Administered 2015-10-06: 10 mg via ORAL
  Filled 2015-10-06: qty 1

## 2015-10-06 MED ORDER — CLOPIDOGREL BISULFATE 75 MG PO TABS: 75.0000 mg | ORAL_TABLET | Freq: Every day | ORAL | 0 refills | Status: DC

## 2015-10-06 MED ORDER — ASPIRIN 81 MG PO CHEW: 81.0000 mg | CHEWABLE_TABLET | Freq: Every day | ORAL | 0 refills | Status: DC

## 2015-10-06 MED ORDER — HYDROCHLOROTHIAZIDE 12.5 MG PO CAPS
12.5000 mg | ORAL_CAPSULE | Freq: Every day | ORAL | Status: DC
  Administered 2015-10-06: 12.5 mg via ORAL
  Filled 2015-10-06: qty 1

## 2015-10-06 MED ORDER — METOPROLOL TARTRATE 50 MG PO TABS: 25.0000 mg | ORAL_TABLET | Freq: Two times a day (BID) | ORAL | 0 refills | Status: DC

## 2015-10-06 MED ORDER — METOPROLOL TARTRATE 25 MG PO TABS
25.0000 mg | ORAL_TABLET | Freq: Once | ORAL | Status: AC
  Administered 2015-10-06: 25 mg via ORAL
  Filled 2015-10-06: qty 1

## 2015-10-06 MED ORDER — ATORVASTATIN CALCIUM 80 MG PO TABS
80.0000 mg | ORAL_TABLET | Freq: Every day | ORAL | Status: DC
  Administered 2015-10-06: 80 mg via ORAL
  Filled 2015-10-06: qty 1

## 2015-10-06 MED ORDER — CLOPIDOGREL BISULFATE 75 MG PO TABS
75.0000 mg | ORAL_TABLET | Freq: Once | ORAL | Status: AC
  Administered 2015-10-06: 75 mg via ORAL
  Filled 2015-10-06: qty 1

## 2015-10-06 MED ORDER — PANTOPRAZOLE SODIUM 40 MG PO TBEC
40.0000 mg | DELAYED_RELEASE_TABLET | Freq: Once | ORAL | Status: AC
  Administered 2015-10-06: 40 mg via ORAL
  Filled 2015-10-06: qty 1

## 2015-10-06 MED ORDER — ATORVASTATIN CALCIUM 80 MG PO TABS: 80.0000 mg | ORAL_TABLET | Freq: Every day | ORAL | 0 refills | Status: DC

## 2015-10-06 MED ORDER — HYDROCHLOROTHIAZIDE 25 MG PO TABS: 12.5000 mg | ORAL_TABLET | Freq: Every day | ORAL | 0 refills | Status: DC

## 2015-10-06 NOTE — Discharge Instructions (Signed)
Get all of your prescriptions filled tomorrow at the [facility] and community wellness [facility]. Keep your scheduled appointment for 10/14/2015

## 2015-10-06 NOTE — ED Triage Notes (Signed)
Pt c/o abd pain x 3 months. Central in nature. States its worse when he eats. Pt also c/o chest pain x 2 weeks. Intermittent. Sharp in nature.

## 2015-10-06 NOTE — ED Provider Notes (Signed)
[facility]-EMERGENCY DEPT Provider Note   CSN: [id number] Arrival date & time: 10/06/15  1514  First Provider Contact:  First MD Initiated Contact with Patient 10/06/15 1707        History   Chief Complaint Chief Complaint  Patient presents with  . Abdominal Pain  . Chest Pain    [name] is a 45 y.o. male.Complains of upper abdominal pain since January 2017 which he occurs daily and is worse with eating. He also complains of anterior chest pain since January 2017 which lasts for 15 seconds at a time, nonexertional. He has 2 or 3 episodes per day. Symptoms are unchanged today. He has been out of all of his medications since January 2017, and has had no medical care. No associated shortness of breath. Chest pain is not made worse with exertion. Abdominal pain is worse with eating no fever no vomiting last bowel movement yesterday, normal no urinary symptoms no other associated symptoms  HPI  Past Medical History:  Diagnosis Date  . Anxiety   . Aortic insufficiency    moderate by echo 04/2014  . Coronary artery disease 03/24/2014   NSTEMI s/p PCI of mid LAD and repeat cath with patent stent  . Depression   . Hypertension   . Tobacco abuse     Patient Active Problem List   Diagnosis Date Noted  . Aortic insufficiency   . MDD (major depressive disorder) (HCC) 08/06/2014  . ETOH abuse 08/06/2014  . Depression   . Coronary artery disease due to lipid rich plaque   . Hypertensive heart disease 03/04/2014  . Tobacco abuse 03/04/2014  . NSTEMI (non-ST elevated myocardial infarction) (HCC) 03/02/2014    Past Surgical History:  Procedure Laterality Date  . CARDIAC CATHETERIZATION  03/04/2014   PCI of mid LAD  . LEFT HEART CATHETERIZATION WITH CORONARY ANGIOGRAM Bilateral 03/04/2014   Procedure: LEFT HEART CATHETERIZATION WITH CORONARY ANGIOGRAM;  Surgeon: Kathleene Hazel, MD;  Location: [facility] C[facility] LAB;  Service: Cardiovascular;  Laterality: Bilateral;  . LEFT HEART  CATHETERIZATION WITH CORONARY ANGIOGRAM N/A 03/26/2014   Procedure: LEFT HEART CATHETERIZATION WITH CORONARY ANGIOGRAM;  Surgeon: Lennette Bihari, MD;  Location: [facility] CATH LAB;  Service: Cardiovascular;  Laterality: N/A;       Home Medications    Prior to Admission medications   Medication Sig Start Date End Date Taking? Authorizing Provider  aspirin 81 MG EC tablet Take 1 tablet (81 mg total) by mouth daily. For health health 06/19/14  Yes Sanjuana Kava, NP  atorvastatin (LIPITOR) 80 MG tablet Take 1 tablet (80 mg total) by mouth daily at 6 PM. For high cholesterol 06/19/14  Yes Sanjuana Kava, NP  clopidogrel (PLAVIX) 75 MG tablet Take 1 tablet (75 mg total) by mouth daily. For prevention of blood clot 06/19/14  Yes Sanjuana Kava, NP  ezetimibe (ZETIA) 10 MG tablet Take 1 tablet (10 mg total) by mouth daily. 10/22/14  Yes Quintella Reichert, MD  hydrochlorothiazide (MICROZIDE) 12.5 MG capsule Take 1 capsule (12.5 mg total) by mouth daily. For high blood pressure 06/19/14  Yes Sanjuana Kava, NP  hydrOXYzine (ATARAX/VISTARIL) 25 MG tablet Take 1 tablet (25 mg total) by mouth 3 (three) times daily with meals as needed for anxiety. 04/20/15  Yes Cleotis Nipper, MD  lamoTRIgine (LAMICTAL) 150 MG tablet Take 1 tablet (150 mg total) by mouth daily. 04/20/15  Yes Cleotis Nipper, MD  metoprolol tartrate (LOPRESSOR) 25 MG tablet Take 0.5 tablets (  12.5 mg total) by mouth 2 (two) times daily. For high blood pressure 06/19/14  Yes Sanjuana KavaAgnes I Nwoko, NP  pantoprazole (PROTONIX) 40 MG tablet Take 1 tablet (40 mg total) by mouth daily. For acid reflux 06/19/14  Yes Sanjuana KavaAgnes I Nwoko, NP  traZODone (DESYREL) 50 MG tablet Take 1 tablet (50 mg total) by mouth at bedtime as needed for sleep. 11/19/14  Yes Cleotis NipperSyed T Arfeen, MD    Family History Family History  Problem Relation Age of Onset  . Hypertension Mother   . Hypertension Father   . Alcohol abuse Father   . Hypertension Other   . Diabetes Other   . Heart attack Other      Social History Social History  Substance Use Topics  . Smoking status: Current Every Day Smoker    Packs/day: 0.50    Years: 0.00    Types: Cigarettes  . Smokeless tobacco: Never Used  . Alcohol use 0.0 oz/week     Comment: occ    Positive smoker positive marijuana no other drug use Allergies   Hydrocodone   Review of Systems Review of Systems  Constitutional: Negative.   HENT: Negative.   Respiratory: Negative.   Cardiovascular: Positive for chest pain.  Gastrointestinal: Positive for abdominal pain.  Musculoskeletal: Negative.   Skin: Negative.   Neurological: Negative.   Psychiatric/Behavioral: Negative.   All other systems reviewed and are negative.    Physical Exam Updated Vital Signs BP 169/82 (BP Location: Right Arm)   Pulse (!) 54   Temp 98.5 F (36.9 C) (Oral)   Resp 12   Ht 5\' 7"  (1.702 m)   Wt 155 lb (70.3 kg)   SpO2 100%   BMI 24.28 kg/m   Physical Exam  Constitutional: He appears well-developed and well-nourished.  HENT:  Head: Normocephalic and atraumatic.  Eyes: Conjunctivae are normal. Pupils are equal, round, and reactive to light.  Neck: Neck supple. No tracheal deviation present. No thyromegaly present.  Cardiovascular: Normal rate and regular rhythm.   No murmur heard. Pulmonary/Chest: Effort normal and breath sounds normal.  Abdominal: Soft. Bowel sounds are normal. He exhibits no distension. There is no tenderness.  Musculoskeletal: Normal range of motion. He exhibits no edema or tenderness.  Neurological: He is alert. Coordination normal.  Skin: Skin is warm and dry. No rash noted.  Psychiatric: He has a normal mood and affect.  Nursing note and vitals reviewed.    ED Treatments / Results  Labs (all labs ordered are listed, but only abnormal results are displayed) Labs Reviewed  BASIC METABOLIC PANEL - Abnormal; Notable for the following:       Result Value   Calcium 8.8 (*)    All other components within normal limits   CBC  LIPASE, BLOOD  URINALYSIS, ROUTINE W REFLEX MICROSCOPIC (NOT AT [facility])  Rosezena SensorI-STAT TROPOININ, ED    EKG  EKG Interpretation  Date/Time:  Wednesday October 06 2015 15:24:12 EDT Ventricular Rate:  57 PR Interval:  180 QRS Duration: 98 QT Interval:  402 QTC Calculation: 391 R Axis:   73 Text Interpretation:  Sinus bradycardia Possible Left atrial enlargement Left ventricular hypertrophy ST & T wave abnormality, consider inferior ischemia Abnormal ECG No significant change was found Confirmed by CAMPOS  MD, Caryn BeeKEVIN ([id number]) on 10/06/2015 3:33:50 PM       Radiology Dg Chest 2 View  Result Date: 10/06/2015 CLINICAL DATA:  Chest pain with nausea and abdominal pain. EXAM: CHEST  2 VIEW COMPARISON:  03/10/2015 FINDINGS: Heart  size and pulmonary vascularity are normal. Coronary artery stent noted. The lungs are clear. No effusions. No osseous abnormality. IMPRESSION: No acute abnormalities. Electronically Signed   By: Francene Boyers M.D.   On: 10/06/2015 15:53    Procedures Procedures (including critical care time)  Medications Ordered in ED Medications  atorvastatin (LIPITOR) tablet 80 mg (not administered)  clopidogrel (PLAVIX) tablet 75 mg (not administered)  ezetimibe (ZETIA) tablet 10 mg (not administered)  hydrochlorothiazide (MICROZIDE) capsule 12.5 mg (not administered)  metoprolol tartrate (LOPRESSOR) tablet 25 mg (not administered)  pantoprazole (PROTONIX) EC tablet 40 mg (not administered)     Initial Impression / Assessment and Plan / ED Course  I have reviewed the triage vital signs and the nursing notes.  Pertinent labs & imaging results that were available during my care of the patient were reviewed by me and considered in my medical decision making (see chart for details).  Clinical Course    Case management called, will arrange for him to get into [facility] and get prescriptions filled at [facility]  where he can be evaluated by cardiology and a PCP Chest x-ray viewed by me Results for orders placed or performed during the hospital encounter of 10/06/15  Basic metabolic panel  Result Value Ref Range   Sodium 135 135 - 145 mmol/L   Potassium 3.7 3.5 - 5.1 mmol/L   Chloride 104 101 - 111 mmol/L   CO2 25 22 - 32 mmol/L   Glucose, Bld 93 65 - 99 mg/dL   BUN 11 6 - 20 mg/dL   Creatinine, Ser 1.61 0.61 - 1.24 mg/dL   Calcium 8.8 (L) 8.9 - 10.3 mg/dL   GFR calc non Af Amer >60 >60 mL/min   GFR calc Af Amer >60 >60 mL/min   Anion gap 6 5 - 15  CBC  Result Value Ref Range   WBC 6.8 4.0 - 10.5 K/uL   RBC 4.78 4.22 - 5.81 MIL/uL   Hemoglobin 14.0 13.0 - 17.0 g/dL   HCT 09.6 04.5 - 40.9 %   MCV 88.1 78.0 - 100.0 fL   MCH 29.3 26.0 - 34.0 pg   MCHC 33.3 30.0 - 36.0 g/dL   RDW 81.1 91.4 - 78.2 %   Platelets 278 150 - 400 K/uL  Lipase, blood  Result Value Ref Range   Lipase 20 11 - 51 U/L  Urinalysis, Routine w reflex microscopic  Result Value Ref Range   Color, Urine YELLOW YELLOW   APPearance CLEAR CLEAR   Specific Gravity, Urine 1.024 1.005 - 1.030   pH 6.0 5.0 - 8.0   Glucose, UA NEGATIVE NEGATIVE mg/dL   Hgb urine dipstick NEGATIVE NEGATIVE   Bilirubin Urine NEGATIVE NEGATIVE   Ketones, ur NEGATIVE NEGATIVE mg/dL   Protein, ur NEGATIVE NEGATIVE mg/dL   Nitrite NEGATIVE NEGATIVE   Leukocytes, UA NEGATIVE NEGATIVE  I-stat troponin, ED  Result Value Ref Range   Troponin i, poc 0.01 0.00 - 0.08 ng/mL   Comment 3          I-stat troponin, ED  Result Value Ref Range   Troponin i, poc 0.02 0.00 - 0.08 ng/mL   Comment 3           Dg Chest 2 View  Result Date: 10/06/2015 CLINICAL DATA:  Chest pain with nausea and abdominal pain. EXAM: CHEST  2 VIEW COMPARISON:  03/10/2015 FINDINGS: Heart size and pulmonary vascularity are normal. Coronary artery stent  noted. The lungs are clear. No effusions. No osseous abnormality. IMPRESSION: No acute abnormalities. Electronically Signed   By:  Francene Boyers M.D.   On: 10/06/2015 15:53  Plan prescriptions Lipitor, Plavix, aspirin, HCTZ, Lopressor, Protonix, trazodone. Case manager arranged for patient to get prescriptions filled at [facility] community wellness [facility] tomorrow. He has an appointment at [facility] for 10/12/2015 I strongly doubt acute coronary syndrome with highly atypical symptoms, nonacute EKG, 2 negative troponins. I counseled patient for 5 minutes on smoking cessation Final Clinical Impressions(s) / ED Diagnoses  Diagnosis #1 atypical chest pain #2 chronic abdominal pain #3 tobacco abuse #4 medication noncompliance Final diagnoses:  None    New Prescriptions New Prescriptions   No medications on file     Doug Sou, MD 10/06/15 2028

## 2015-10-14 ENCOUNTER — Encounter: Payer: Self-pay | Admitting: Family Medicine

## 2015-10-14 ENCOUNTER — Ambulatory Visit: Payer: Self-pay | Attending: Family Medicine | Admitting: Family Medicine

## 2015-10-14 VITALS — BP 142/66 | HR 72 | Temp 98.2°F | Ht 67.0 in | Wt 150.8 lb

## 2015-10-14 DIAGNOSIS — Z7982 Long term (current) use of aspirin: Secondary | ICD-10-CM | POA: Insufficient documentation

## 2015-10-14 DIAGNOSIS — I2510 Atherosclerotic heart disease of native coronary artery without angina pectoris: Secondary | ICD-10-CM | POA: Insufficient documentation

## 2015-10-14 DIAGNOSIS — Z716 Tobacco abuse counseling: Secondary | ICD-10-CM | POA: Insufficient documentation

## 2015-10-14 DIAGNOSIS — I351 Nonrheumatic aortic (valve) insufficiency: Secondary | ICD-10-CM | POA: Insufficient documentation

## 2015-10-14 DIAGNOSIS — Z720 Tobacco use: Secondary | ICD-10-CM

## 2015-10-14 DIAGNOSIS — I2583 Coronary atherosclerosis due to lipid rich plaque: Secondary | ICD-10-CM | POA: Insufficient documentation

## 2015-10-14 DIAGNOSIS — Z79899 Other long term (current) drug therapy: Secondary | ICD-10-CM | POA: Insufficient documentation

## 2015-10-14 DIAGNOSIS — I119 Hypertensive heart disease without heart failure: Secondary | ICD-10-CM | POA: Insufficient documentation

## 2015-10-14 DIAGNOSIS — Z9889 Other specified postprocedural states: Secondary | ICD-10-CM | POA: Insufficient documentation

## 2015-10-14 MED ORDER — BUPROPION HCL ER (SR) 150 MG PO TB12: 150.0000 mg | ORAL_TABLET | Freq: Two times a day (BID) | ORAL | 3 refills | Status: DC

## 2015-10-14 MED FILL — ATORVASTATIN 80 MG TABLET: 80 | 30 days supply | Qty: 30 | Fill #0

## 2015-10-14 MED FILL — EZETIMIBE 10 MG TABLET: 10 | 30 days supply | Qty: 30 | Fill #0

## 2015-10-14 MED FILL — ?METOPROLOL 50 MG TABLET: 50 | 30 days supply | Qty: 30 | Fill #0

## 2015-10-14 MED FILL — HYDROCHLOROTHIAZIDE 25 MG T: 25 | 30 days supply | Qty: 15 | Fill #0

## 2015-10-14 MED FILL — CLOPIDOGREL 75 MG TABLET: 75 | 30 days supply | Qty: 30 | Fill #0

## 2015-10-14 NOTE — Progress Notes (Signed)
Subjective:  Patient ID: Darren [name], male    DOB: [birth year]  Age: 45 y.o. MRN: [id number]  CC: Hypertension; Depression; Shortness of Breath; and Abdominal Pain   [name] is a 45 year old male with a history of aortic insufficiency, coronary artery disease (status post stent), hypertension who presents to the clinic to establish care.  He was last seen at the ED a week ago after he presented with abdominal and chest pains and had been out of both his medications for a year. He was ruled out for ACS and medications were refilled with recommendations to follow-up here in the clinic.  He [facility] received [facility] until he lost his insurance and his cardiologist was Dr. Mayford Knifeurner. He smokes half a pack of cigarettes a day and is willing to work on quitting. Has no complaints at this time.  Past Medical History:  Diagnosis Date  . Anxiety   . Aortic insufficiency    moderate by echo 04/2014  . Coronary artery disease 03/24/2014   NSTEMI s/p PCI of mid LAD and repeat cath with patent stent  . Depression   . Hypertension   . Tobacco abuse     Past Surgical History:  Procedure Laterality Date  . CARDIAC CATHETERIZATION  03/04/2014   PCI of mid LAD  . LEFT HEART CATHETERIZATION WITH CORONARY ANGIOGRAM Bilateral 03/04/2014   Procedure: LEFT HEART CATHETERIZATION WITH CORONARY ANGIOGRAM;  Surgeon: Kathleene Hazelhristopher D McAlhany, MD;  [facility] LAB;  Service: Cardiovascular;  Laterality: Bilateral;  . LEFT HEART CATHETERIZATION WITH CORONARY ANGIOGRAM N/A 03/26/2014   Procedure: LEFT HEART CATHETERIZATION WITH CORONARY ANGIOGRAM;  Surgeon: Lennette Biharihomas A Kelly, MD;  [facility] LAB;  Service: Cardiovascular;  Laterality: N/A;    Allergies  Allergen Reactions  . Hydrocodone Hives     Outpatient Medications Prior to Visit  Medication Sig Dispense Refill  . aspirin 81 MG chewable tablet Chew 1 tablet (81 mg total) by mouth daily. (Patient not  taking: Reported on 10/14/2015) 30 tablet 0  . atorvastatin (LIPITOR) 80 MG tablet Take 1 tablet (80 mg total) by mouth daily at 6 PM. For high cholesterol (Patient not taking: Reported on 10/14/2015) 30 tablet 5  . clopidogrel (PLAVIX) 75 MG tablet Take 1 tablet (75 mg total) by mouth daily. (Patient not taking: Reported on 10/14/2015) 30 tablet 0  . ezetimibe (ZETIA) 10 MG tablet Take 1 tablet (10 mg total) by mouth daily. (Patient not taking: Reported on 10/14/2015) 30 tablet 11  . hydrochlorothiazide (HYDRODIURIL) 25 MG tablet Take 0.5 tablets (12.5 mg total) by mouth daily. (Patient not taking: Reported on 10/14/2015) 15 tablet 0  . hydrOXYzine (ATARAX/VISTARIL) 25 MG tablet Take 1 tablet (25 mg total) by mouth 3 (three) times daily with meals as needed for anxiety. (Patient not taking: Reported on 10/14/2015) 90 tablet 1  . lamoTRIgine (LAMICTAL) 150 MG tablet Take 1 tablet (150 mg total) by mouth daily. (Patient not taking: Reported on 10/14/2015) 30 tablet 1  . metoprolol (LOPRESSOR) 50 MG tablet Take 0.5 tablets (25 mg total) by mouth 2 (two) times daily. (Patient not taking: Reported on 10/14/2015) 30 tablet 0  . metoprolol tartrate (LOPRESSOR) 25 MG tablet Take 0.5 tablets (12.5 mg total) by mouth 2 (two) times daily. For high blood pressure (Patient not taking: Reported on 10/14/2015)    . pantoprazole (PROTONIX) 20 MG tablet Take 2 tablets (40 mg total) by mouth daily. (Patient not taking: Reported on 10/14/2015) 60 tablet  0  . pantoprazole (PROTONIX) 40 MG tablet Take 1 tablet (40 mg total) by mouth daily. For acid reflux (Patient not taking: Reported on 10/14/2015) 30 tablet 0  . traZODone (DESYREL) 50 MG tablet Take 1 tablet (50 mg total) by mouth at bedtime as needed for sleep. (Patient not taking: Reported on 10/14/2015) 30 tablet 0  . aspirin 81 MG EC tablet Take 1 tablet (81 mg total) by mouth daily. For health health 30 tablet 12  . atorvastatin (LIPITOR) 80 MG tablet Take 1 tablet (80 mg  total) by mouth daily. 30 tablet 0  . ezetimibe (ZETIA) 10 MG tablet Take 1 tablet (10 mg total) by mouth daily. 30 tablet 0  . hydrochlorothiazide (MICROZIDE) 12.5 MG capsule Take 1 capsule (12.5 mg total) by mouth daily. For high blood pressure 30 capsule 5   No facility-administered medications prior to visit.     ROS Review of Systems  Constitutional: Negative for activity change and appetite change.  HENT: Negative for sinus pressure and sore throat.   Eyes: Negative for visual disturbance.  Respiratory: Negative for cough, chest tightness and shortness of breath.   Cardiovascular: Negative for chest pain and leg swelling.  Gastrointestinal: Negative for abdominal distention, abdominal pain, constipation and diarrhea.  Endocrine: Negative.   Genitourinary: Negative for dysuria.  Musculoskeletal: Negative for joint swelling and myalgias.  Skin: Negative for rash.  Allergic/Immunologic: Negative.   Neurological: Negative for weakness, light-headedness and numbness.  Psychiatric/Behavioral: Negative for dysphoric mood and suicidal ideas.    Objective:  BP (!) 142/66 (BP Location: Right Arm, Patient Position: Sitting, Cuff Size: Large)   Pulse 72   Temp 98.2 F (36.8 C) (Oral)   Ht 5\' 7"  (1.702 m)   Wt 150 lb 12.8 oz (68.4 kg)   SpO2 97%   BMI 23.62 kg/m   BP/Weight 10/14/2015 10/06/2015 03/10/2015  Systolic BP 142 159 152  Diastolic BP 66 85 83  Wt. (Lbs) 150.8 155 -  BMI 23.62 24.28 -  Some encounter information is confidential and restricted. Go to Review Flowsheets activity to see all data.      Physical Exam  Constitutional: He is oriented to person, place, and time. He appears well-developed and well-nourished.  Cardiovascular: Normal rate and intact distal pulses.   Murmur (2/6 diastolic) heard. Pulmonary/Chest: Effort normal and breath sounds normal. He has no wheezes. He has no rales. He exhibits no tenderness.  Abdominal: Soft. Bowel sounds are normal. He  exhibits no distension and no mass. There is no tenderness.  Musculoskeletal: Normal range of motion.  Neurological: He is alert and oriented to person, place, and time.  Skin: Skin is warm and dry.  Psychiatric: He has a normal mood and affect.     Assessment & Plan:   1. Aortic insufficiency Stable  2. Coronary artery disease due to lipid rich plaque Status post stent Continue Plavix and aspirin which I have advised him to pick up from the pharmacy today I have educated him that the pharmacy will be willing to work with him as he states he has no money to pick up his meds Encouraged to apply for the [facility] financial aid facilitate referral back to his cardiologist  3. Hypertensive heart disease without heart failure No evidence of acute failure Elevated blood pressure due to failure to take antihypertensives-compliance emphasized  4. Tobacco abuse Spent 3 minutes counseling on cessation and he would like to try Wellbutrin over the patches. - buPROPion (WELLBUTRIN SR) 150 MG  12 hr tablet; Take 1 tablet (150 mg total) by mouth 2 (two) times daily. For smoking cessation  Dispense: 60 tablet; Refill: 3   Meds ordered this encounter  Medications  . buPROPion (WELLBUTRIN SR) 150 MG 12 hr tablet    Sig: Take 1 tablet (150 mg total) by mouth 2 (two) times daily. For smoking cessation    Dispense:  60 tablet    Refill:  3    Follow-up: Return in about 1 month (around 11/14/2015) for Follow-up on high blood pressure.   Jaclyn Shaggy MD

## 2015-10-25 ENCOUNTER — Other Ambulatory Visit: Payer: Self-pay

## 2015-10-25 MED ORDER — EZETIMIBE 10 MG PO TABS: 10.0000 mg | ORAL_TABLET | Freq: Every day | ORAL | 3 refills | Status: DC

## 2015-11-10 ENCOUNTER — Emergency Department (HOSPITAL_COMMUNITY)
Admission: EM | Admit: 2015-11-10 | Discharge: 2015-11-10 | Disposition: A | Discharge status: Home / Self Care | Payer: Managed Care, Other (non HMO) | Attending: Emergency Medicine | Admitting: Emergency Medicine

## 2015-11-10 ENCOUNTER — Emergency Department (HOSPITAL_COMMUNITY): Payer: Managed Care, Other (non HMO)

## 2015-11-10 ENCOUNTER — Telehealth: Payer: Self-pay | Admitting: Cardiology

## 2015-11-10 ENCOUNTER — Encounter (HOSPITAL_COMMUNITY): Payer: Self-pay

## 2015-11-10 DIAGNOSIS — I10 Essential (primary) hypertension: Secondary | ICD-10-CM | POA: Diagnosis not present

## 2015-11-10 DIAGNOSIS — Z79899 Other long term (current) drug therapy: Secondary | ICD-10-CM | POA: Insufficient documentation

## 2015-11-10 DIAGNOSIS — J069 Acute upper respiratory infection, unspecified: Secondary | ICD-10-CM

## 2015-11-10 DIAGNOSIS — R05 Cough: Secondary | ICD-10-CM | POA: Diagnosis present

## 2015-11-10 DIAGNOSIS — I2510 Atherosclerotic heart disease of native coronary artery without angina pectoris: Secondary | ICD-10-CM | POA: Diagnosis not present

## 2015-11-10 DIAGNOSIS — F17210 Nicotine dependence, cigarettes, uncomplicated: Secondary | ICD-10-CM | POA: Insufficient documentation

## 2015-11-10 DIAGNOSIS — B9789 Other viral agents as the cause of diseases classified elsewhere: Secondary | ICD-10-CM

## 2015-11-10 DIAGNOSIS — R079 Chest pain, unspecified: Secondary | ICD-10-CM

## 2015-11-10 LAB — I-STAT TROPONIN, ED
TROPONIN I, POC: 0.04 ng/mL (ref 0.00–0.08)
TROPONIN I, POC: 0.03 ng/mL (ref 0.00–0.08)

## 2015-11-10 LAB — I-STAT CHEM 8, ED
BUN: 10 mg/dL (ref 6–20)
CHLORIDE: 101 mmol/L (ref 101–111)
Calcium, Ion: 1.09 mmol/L — ABNORMAL LOW (ref 1.15–1.40)
Creatinine, Ser: 1.2 mg/dL (ref 0.61–1.24)
Glucose, Bld: 95 mg/dL (ref 65–99)
HEMATOCRIT: 46 % (ref 39.0–52.0)
Hemoglobin: 15.6 g/dL (ref 13.0–17.0)
POTASSIUM: 3.5 mmol/L (ref 3.5–5.1)
Sodium: 140 mmol/L (ref 135–145)
TCO2: 27 mmol/L (ref 0–100)

## 2015-11-10 MED ORDER — GUAIFENESIN-DM 100-10 MG/5ML PO SYRP
5.0000 mL | ORAL_SOLUTION | ORAL | Status: DC | PRN
  Administered 2015-11-10: 5 mL via ORAL
  Filled 2015-11-10: qty 5

## 2015-11-10 MED ORDER — ALBUTEROL SULFATE HFA 108 (90 BASE) MCG/ACT IN AERS
2.0000 | INHALATION_SPRAY | Freq: Once | RESPIRATORY_TRACT | Status: AC
  Administered 2015-11-10: 2 via RESPIRATORY_TRACT
  Filled 2015-11-10: qty 6.7

## 2015-11-10 MED ORDER — GUAIFENESIN-DM 100-10 MG/5ML PO SYRP: 5.0000 mL | ORAL_SOLUTION | ORAL | 0 refills | Status: DC | PRN

## 2015-11-10 MED ORDER — ACETAMINOPHEN 500 MG PO TABS: 500.0000 mg | ORAL_TABLET | Freq: Four times a day (QID) | ORAL | 0 refills | Status: DC | PRN

## 2015-11-10 MED ORDER — BENZONATATE 100 MG PO CAPS: 100.0000 mg | ORAL_CAPSULE | Freq: Two times a day (BID) | ORAL | 0 refills | Status: DC | PRN

## 2015-11-10 MED ORDER — IPRATROPIUM-ALBUTEROL 0.5-2.5 (3) MG/3ML IN SOLN
3.0000 mL | Freq: Once | RESPIRATORY_TRACT | Status: AC
  Administered 2015-11-10: 3 mL via RESPIRATORY_TRACT
  Filled 2015-11-10: qty 3

## 2015-11-10 NOTE — Discharge Instructions (Signed)
Read the information below.  Use the prescribed medication as directed.  Please discuss all new medications with your pharmacist.  You may return to the Emergency Department at any time for worsening condition or any new symptoms that concern you.      If you develop high fevers that do not resolve with tylenol or ibuprofen, you have difficulty swallowing or breathing, or you are unable to tolerate fluids by mouth, return to the ER for a recheck.     If you develop worsening chest pain, shortness of breath, fever, you pass out, or become weak or dizzy, return to the ER for a recheck.

## 2015-11-10 NOTE — ED Notes (Signed)
PA at bedside.

## 2015-11-10 NOTE — ED Provider Notes (Signed)
[facility]-EMERGENCY DEPT Provider Note   CSN: [id number] Arrival date & time: 11/10/15  1623   By signing my name below, I, Clovis Pu, attest that this documentation has been prepared under the direction and in the presence of  [facility], PA-C. Electronically Signed: Clovis Pu, ED Scribe. 11/10/15. 6:02 PM.  History   Chief Complaint Chief Complaint  Patient presents with  . URI   The history is provided by the patient. No language interpreter was used.   HPI Comments:  Darren [name] is a 45 y.o. male, with a hx of MI, who presents to the Emergency Department complaining URI symptoms that began 2 days ago. The symptoms began with a sore throat and have progressed into nasal congestion, cough productive of white sputum, occasional nausea, pain in his back and chest with coughing, mild SOB.  Has felt hot/cold.  Denies leg swelling, hemoptysis.   Has OTC medication without improvement.  Also notes recent episodes of left sided chest pain and left arm heaviness that feel similar to his prior MI.  Pt has NSTEMI with PCI of midLAD in Jan 2016.  Was off of his medications for some time and recently started back 3 weeks ago.  Also notes he has anxiety and sometimes feels overwhelmed, thinks the chest pain might be related to this.  He states he has been feeling stressed recently. Pt denies diaphoresis, lightheadedness, nausea, leg swelling, recent travel or immobilization. Pt is on blood thinners.   Cardiologist is Dr Mayford Knife.  He does not currently have an appointment in place, has been waiting for his insurance cards to come through the mail.    Past Medical History:  Diagnosis Date  . Anxiety   . Aortic insufficiency    moderate by echo 04/2014  . Coronary artery disease 03/24/2014   NSTEMI s/p PCI of mid LAD and repeat cath with patent stent  . Depression   . Hypertension   . Tobacco abuse     Patient Active Problem List   Diagnosis Date Noted  . Aortic insufficiency   . MDD  (major depressive disorder) (HCC) 08/06/2014  . ETOH abuse 08/06/2014  . Depression   . Coronary artery disease due to lipid rich plaque   . Hypertensive heart disease 03/04/2014  . Tobacco abuse 03/04/2014  . NSTEMI (non-ST elevated myocardial infarction) (HCC) 03/02/2014    Past Surgical History:  Procedure Laterality Date  . CARDIAC CATHETERIZATION  03/04/2014   PCI of mid LAD  . LEFT HEART CATHETERIZATION WITH CORONARY ANGIOGRAM Bilateral 03/04/2014   Procedure: LEFT HEART CATHETERIZATION WITH CORONARY ANGIOGRAM;  Surgeon: Kathleene Hazel, MD;  Location: [facility] CATH LAB;  Service: Cardiovascular;  Laterality: Bilateral;  . LEFT HEART CATHETERIZATION WITH CORONARY ANGIOGRAM N/A 03/26/2014   Procedure: LEFT HEART CATHETERIZATION WITH CORONARY ANGIOGRAM;  Surgeon: Lennette Bihari, MD;  Location: [facility] CATH LAB;  Service: Cardiovascular;  Laterality: N/A;       Home Medications    Prior to Admission medications   Medication Sig Start Date End Date Taking? Authorizing Provider  acetaminophen (TYLENOL) 500 MG tablet Take 1-2 tablets (500-1,000 mg total) by mouth every 6 (six) hours as needed for mild pain, moderate pain, fever or headache. 11/10/15   Trixie Dredge, PA-C  aspirin 81 MG chewable tablet Chew 1 tablet (81 mg total) by mouth daily. Patient not taking: Reported on 10/14/2015 10/06/15   Doug Sou, MD  atorvastatin (LIPITOR) 80 MG tablet Take 1 tablet (80 mg total) by mouth daily at  6 PM. For high cholesterol Patient not taking: Reported on 10/14/2015 06/19/14   Sanjuana KavaAgnes I Nwoko, NP  benzonatate (TESSALON) 100 MG capsule Take 1 capsule (100 mg total) by mouth 2 (two) times daily as needed for cough. 11/10/15   Trixie DredgeEmily Tola Meas, PA-C  buPROPion (WELLBUTRIN SR) 150 MG 12 hr tablet Take 1 tablet (150 mg total) by mouth 2 (two) times daily. For smoking cessation 10/14/15   Jaclyn ShaggyEnobong Amao, MD  clopidogrel (PLAVIX) 75 MG tablet Take 1 tablet (75 mg total) by mouth daily. Patient not taking: Reported on  10/14/2015 10/06/15   Doug SouSam Jacubowitz, MD  ezetimibe (ZETIA) 10 MG tablet Take 1 tablet (10 mg total) by mouth daily. 10/25/15   Jaclyn ShaggyEnobong Amao, MD  guaiFENesin-dextromethorphan (ROBITUSSIN DM) 100-10 MG/5ML syrup Take 5 mLs by mouth every 4 (four) hours as needed for cough (and congestion). 11/10/15   Trixie DredgeEmily Maday Guarino, PA-C  hydrochlorothiazide (HYDRODIURIL) 25 MG tablet Take 0.5 tablets (12.5 mg total) by mouth daily. Patient not taking: Reported on 10/14/2015 10/06/15   Doug SouSam Jacubowitz, MD  hydrOXYzine (ATARAX/VISTARIL) 25 MG tablet Take 1 tablet (25 mg total) by mouth 3 (three) times daily with meals as needed for anxiety. Patient not taking: Reported on 10/14/2015 04/20/15   Cleotis NipperSyed T Arfeen, MD  lamoTRIgine (LAMICTAL) 150 MG tablet Take 1 tablet (150 mg total) by mouth daily. Patient not taking: Reported on 10/14/2015 04/20/15   Cleotis NipperSyed T Arfeen, MD  metoprolol (LOPRESSOR) 50 MG tablet Take 0.5 tablets (25 mg total) by mouth 2 (two) times daily. Patient not taking: Reported on 10/14/2015 10/06/15   Doug SouSam Jacubowitz, MD  metoprolol tartrate (LOPRESSOR) 25 MG tablet Take 0.5 tablets (12.5 mg total) by mouth 2 (two) times daily. For high blood pressure Patient not taking: Reported on 10/14/2015 06/19/14   Sanjuana KavaAgnes I Nwoko, NP  pantoprazole (PROTONIX) 20 MG tablet Take 2 tablets (40 mg total) by mouth daily. Patient not taking: Reported on 10/14/2015 10/06/15   Doug SouSam Jacubowitz, MD  pantoprazole (PROTONIX) 40 MG tablet Take 1 tablet (40 mg total) by mouth daily. For acid reflux Patient not taking: Reported on 10/14/2015 06/19/14   Sanjuana KavaAgnes I Nwoko, NP  traZODone (DESYREL) 50 MG tablet Take 1 tablet (50 mg total) by mouth at bedtime as needed for sleep. Patient not taking: Reported on 10/14/2015 11/19/14   Cleotis NipperSyed T Arfeen, MD    Family History Family History  Problem Relation Age of Onset  . Hypertension Mother   . Hypertension Father   . Alcohol abuse Father   . Hypertension Other   . Diabetes Other   . Heart attack Other     Social  History Social History  Substance Use Topics  . Smoking status: Current Every Day Smoker    Packs/day: 1.00    Years: 0.00    Types: Cigarettes  . Smokeless tobacco: Never Used  . Alcohol use 1.2 - 1.8 oz/week    2 - 3 Cans of beer per week     Comment: occ     Allergies   Hydrocodone   Review of Systems Review of Systems  Respiratory: Positive for shortness of breath.   Cardiovascular: Positive for chest pain.  Musculoskeletal: Positive for back pain.  Neurological: Positive for numbness.  All other systems reviewed and are negative.    Physical Exam Updated Vital Signs BP 161/80 (BP Location: Left Arm)   Pulse 75   Temp 98.6 F (37 C) (Oral)   Resp 17   SpO2 98%   Physical Exam  Constitutional: He appears well-developed and well-nourished. No distress.  HENT:  Head: Normocephalic and atraumatic.  Mouth/Throat: Oropharynx is clear and moist. No oropharyngeal exudate.  Eyes: Conjunctivae and EOM are normal. Right eye exhibits no discharge. Left eye exhibits no discharge.  Neck: Normal range of motion. Neck supple.  Cardiovascular: Normal rate and regular rhythm.   Pulmonary/Chest: Effort normal and breath sounds normal. No stridor. No respiratory distress. He has no wheezes. He has no rales.  Occasional cough    Abdominal: Soft. He exhibits no distension and no mass. There is no tenderness. There is no rebound and no guarding.  Lymphadenopathy:    He has no cervical adenopathy.  Neurological: He is alert. He exhibits normal muscle tone.  Skin: He is not diaphoretic.  Nursing note and vitals reviewed.    ED Treatments / Results  DIAGNOSTIC STUDIES:  Oxygen Saturation is 100% on RA, normal by my interpretation.    COORDINATION OF CARE:  5:54 PM Discussed treatment plan with pt at bedside and pt agreed to plan.  Labs (all labs ordered are listed, but only abnormal results are displayed) Labs Reviewed  I-STAT CHEM 8, ED - Abnormal; Notable for the  following:       Result Value   Calcium, Ion 1.09 (*)    All other components within normal limits  I-STAT TROPOININ, ED  I-STAT TROPOININ, ED    EKG  EKG Interpretation  Date/Time:  Wednesday November 10 2015 18:47:30 EDT Ventricular Rate:  74 PR Interval:  182 QRS Duration: 90 QT Interval:  374 QTC Calculation: 415 R Axis:   72 Text Interpretation:  Normal sinus rhythm Biatrial enlargement Left ventricular hypertrophy with repolarization abnormality Abnormal ECG No significant change since last tracing Confirmed by ISAACS MD, Sheria Lang [phone]) on 11/10/2015 6:58:35 PM       Radiology Dg Chest 2 View  Result Date: 11/10/2015 CLINICAL DATA:  Productive cough and chest pain for 2 days. Hypertension. EXAM: CHEST  2 VIEW COMPARISON:  10/06/2015 FINDINGS: The heart size and mediastinal contours are within normal limits. Both lungs are clear. The visualized skeletal structures are unremarkable. IMPRESSION: Negative.  No active cardiopulmonary disease. Electronically Signed   By: Myles Rosenthal M.D.   On: 11/10/2015 18:33    Procedures Procedures (including critical care time)  Medications Ordered in ED Medications  guaiFENesin-dextromethorphan (ROBITUSSIN DM) 100-10 MG/5ML syrup 5 mL (5 mLs Oral Given 11/10/15 2116)  albuterol (PROVENTIL HFA;VENTOLIN HFA) 108 (90 Base) MCG/ACT inhaler 2 puff (2 puffs Inhalation Given 11/10/15 1930)  ipratropium-albuterol (DUONEB) 0.5-2.5 (3) MG/3ML nebulizer solution 3 mL (3 mLs Nebulization Given 11/10/15 2123)     Initial Impression / Assessment and Plan / ED Course  I have reviewed the triage vital signs and the nursing notes.  Pertinent labs & imaging results that were available during my care of the patient were reviewed by me and considered in my medical decision making (see chart for details).  Clinical Course  Value Comment By Time  Troponin i, poc: 0.04 (Reviewed) Shaune Pollack, MD 09/13 1858   I spoke with Dr Mayford Knife who will have her  office call him with a close follow up visit.  Per her recommendation will also check second troponin prior to discharge.   Trixie Dredge, PA-C 09/13 2059    Pt with hx CAD s/p MI with PCI in mid-LAD, off his medications for approximately 6 months and recently restarted.  Has symptoms x 2 days consistent with URI but also notes concern of  left chest pain and left arm heaviness that is increasing in frequency - last was 5 days ago.  Troponin negative.  Chem 8 unremarkable.  CXR negative for acute infiltrate.  EKG unchanged from prior.   Discussed pt with Dr Erma Heritage who also spoke with the patient.  Consult to cardiology- I spoke with Dr Mayford Knife.  See note above.  Second troponin negative.  Pt confirms multiple times that last episode of left chest and arm pain was 4 days ago. I suspect this may be anginal pain, though he has been seen several times for symptoms thought to be more atypical.   I have discussed my findings and concern about his increasing frequency of chest pain very thoroughly with patient and have strongly encouraged him to follow up with Dr Mayford Knife ASAP.  He appears to be very motivated to do so.  He is taking all of his medications.  We discussed strict return precautions, including returning (or going directly to [facility]) with any new chest pain/arm heaviness.  D/C home with symptomatic medications for viral respiratory illness.  Discussed result, findings, treatment, and follow up  with patient.  Pt given return precautions.  Pt verbalizes understanding and agrees with plan.       Final Clinical Impressions(s) / ED Diagnoses   Final diagnoses:  Viral URI with cough  Intermittent chest pain    New Prescriptions New Prescriptions   ACETAMINOPHEN (TYLENOL) 500 MG TABLET    Take 1-2 tablets (500-1,000 mg total) by mouth every 6 (six) hours as needed for mild pain, moderate pain, fever or headache.   BENZONATATE (TESSALON) 100 MG CAPSULE    Take 1 capsule (100 mg total) by mouth 2 (two) times  daily as needed for cough.   GUAIFENESIN-DEXTROMETHORPHAN (ROBITUSSIN DM) 100-10 MG/5ML SYRUP    Take 5 mLs by mouth every 4 (four) hours as needed for cough (and congestion).   Albuterol inhaler given in ED.   I personally performed the services described in this documentation, which was scribed in my presence. The recorded information has been reviewed and is accurate.    Trixie Dredge, PA-C 11/10/15 2228    Shaune Pollack, MD 11/11/15 [phone]

## 2015-11-10 NOTE — ED Triage Notes (Signed)
Pt c/o productive cough, congestion, and back pain x2 days. Pt afebrile during triage. Pt A+OX4.

## 2015-11-10 NOTE — ED Notes (Signed)
Respiratory called for breathing treatment.

## 2015-11-10 NOTE — Telephone Encounter (Signed)
Please call patient to schedule followup OV - seen in ER with URI but had some CP last week.  He had stopped taking his meds.  Now back on cardiac meds.  Please setup OV with me or extender ASAP

## 2015-11-11 ENCOUNTER — Encounter (HOSPITAL_COMMUNITY): Payer: Self-pay | Admitting: *Deleted

## 2015-11-11 ENCOUNTER — Emergency Department (HOSPITAL_COMMUNITY): Payer: Managed Care, Other (non HMO)

## 2015-11-11 ENCOUNTER — Observation Stay (HOSPITAL_COMMUNITY)
Admission: EM | Admit: 2015-11-11 | Discharge: 2015-11-13 | Disposition: A | Discharge status: Home / Self Care | Payer: Managed Care, Other (non HMO) | Attending: Internal Medicine | Admitting: Internal Medicine

## 2015-11-11 DIAGNOSIS — I2510 Atherosclerotic heart disease of native coronary artery without angina pectoris: Secondary | ICD-10-CM | POA: Diagnosis present

## 2015-11-11 DIAGNOSIS — I10 Essential (primary) hypertension: Secondary | ICD-10-CM | POA: Diagnosis present

## 2015-11-11 DIAGNOSIS — I2583 Coronary atherosclerosis due to lipid rich plaque: Secondary | ICD-10-CM | POA: Insufficient documentation

## 2015-11-11 DIAGNOSIS — E785 Hyperlipidemia, unspecified: Secondary | ICD-10-CM | POA: Insufficient documentation

## 2015-11-11 DIAGNOSIS — R0789 Other chest pain: Secondary | ICD-10-CM | POA: Diagnosis not present

## 2015-11-11 DIAGNOSIS — I209 Angina pectoris, unspecified: Secondary | ICD-10-CM | POA: Diagnosis present

## 2015-11-11 DIAGNOSIS — F419 Anxiety disorder, unspecified: Secondary | ICD-10-CM | POA: Diagnosis not present

## 2015-11-11 DIAGNOSIS — Z79899 Other long term (current) drug therapy: Secondary | ICD-10-CM | POA: Insufficient documentation

## 2015-11-11 DIAGNOSIS — Z955 Presence of coronary angioplasty implant and graft: Secondary | ICD-10-CM | POA: Diagnosis not present

## 2015-11-11 DIAGNOSIS — Z7982 Long term (current) use of aspirin: Secondary | ICD-10-CM | POA: Diagnosis not present

## 2015-11-11 DIAGNOSIS — I252 Old myocardial infarction: Secondary | ICD-10-CM | POA: Diagnosis not present

## 2015-11-11 DIAGNOSIS — F17210 Nicotine dependence, cigarettes, uncomplicated: Secondary | ICD-10-CM | POA: Insufficient documentation

## 2015-11-11 DIAGNOSIS — I119 Hypertensive heart disease without heart failure: Secondary | ICD-10-CM | POA: Insufficient documentation

## 2015-11-11 DIAGNOSIS — T82858A Stenosis of vascular prosthetic devices, implants and grafts, initial encounter: Secondary | ICD-10-CM | POA: Diagnosis not present

## 2015-11-11 DIAGNOSIS — Z7902 Long term (current) use of antithrombotics/antiplatelets: Secondary | ICD-10-CM | POA: Diagnosis not present

## 2015-11-11 DIAGNOSIS — Y831 Surgical operation with implant of artificial internal device as the cause of abnormal reaction of the patient, or of later complication, without mention of misadventure at the time of the procedure: Secondary | ICD-10-CM | POA: Diagnosis not present

## 2015-11-11 DIAGNOSIS — F329 Major depressive disorder, single episode, unspecified: Secondary | ICD-10-CM | POA: Insufficient documentation

## 2015-11-11 DIAGNOSIS — R079 Chest pain, unspecified: Secondary | ICD-10-CM | POA: Diagnosis present

## 2015-11-11 DIAGNOSIS — Z720 Tobacco use: Secondary | ICD-10-CM | POA: Diagnosis not present

## 2015-11-11 HISTORY — DX: Angina pectoris, unspecified: I20.9

## 2015-11-11 LAB — BASIC METABOLIC PANEL
Anion gap: 6 (ref 5–15)
BUN: 6 mg/dL (ref 6–20)
CALCIUM: 8.3 mg/dL — AB (ref 8.9–10.3)
CO2: 25 mmol/L (ref 22–32)
CREATININE: 1.01 mg/dL (ref 0.61–1.24)
Chloride: 106 mmol/L (ref 101–111)
GFR calc Af Amer: >60 mL/min (ref >60)
GLUCOSE: 115 mg/dL — AB (ref 65–99)
POTASSIUM: 3.6 mmol/L (ref 3.5–5.1)
SODIUM: 137 mmol/L (ref 135–145)

## 2015-11-11 LAB — TROPONIN I

## 2015-11-11 LAB — CBC
HCT: 41.1 % (ref 39.0–52.0)
Hemoglobin: 13.8 g/dL (ref 13.0–17.0)
MCH: 29.5 pg (ref 26.0–34.0)
MCHC: 33.6 g/dL (ref 30.0–36.0)
MCV: 87.8 fL (ref 78.0–100.0)
PLATELETS: 250 10*3/uL (ref 150–400)
RBC: 4.68 MIL/uL (ref 4.22–5.81)
RDW: 14.6 % (ref 11.5–15.5)
WBC: 9.4 10*3/uL (ref 4.0–10.5)

## 2015-11-11 LAB — I-STAT TROPONIN, ED: Troponin i, poc: 0.01 ng/mL (ref 0.00–0.08)

## 2015-11-11 MED ORDER — GI COCKTAIL ~~LOC~~
30.0000 mL | Freq: Four times a day (QID) | ORAL | Status: DC | PRN
  Administered 2015-11-13: 30 mL via ORAL
  Filled 2015-11-11: qty 30

## 2015-11-11 MED ORDER — TRAZODONE HCL 50 MG PO TABS: 50.0000 mg | ORAL_TABLET | Freq: Every evening | ORAL | Status: DC | PRN

## 2015-11-11 MED ORDER — ACETAMINOPHEN 325 MG PO TABS
650.0000 mg | ORAL_TABLET | ORAL | Status: DC | PRN
  Administered 2015-11-13: 650 mg via ORAL
  Filled 2015-11-11: qty 2

## 2015-11-11 MED ORDER — NITROGLYCERIN 0.4 MG SL SUBL: 0.4000 mg | SUBLINGUAL_TABLET | SUBLINGUAL | Status: DC | PRN

## 2015-11-11 MED ORDER — ONDANSETRON HCL 4 MG/2ML IJ SOLN: 4.0000 mg | Freq: Four times a day (QID) | INTRAMUSCULAR | Status: DC | PRN

## 2015-11-11 MED ORDER — CLOPIDOGREL BISULFATE 75 MG PO TABS
75.0000 mg | ORAL_TABLET | Freq: Every day | ORAL | Status: DC
  Administered 2015-11-12 – 2015-11-13 (×2): 75 mg via ORAL
  Filled 2015-11-11 (×2): qty 1

## 2015-11-11 MED ORDER — METOPROLOL TARTRATE 25 MG PO TABS
25.0000 mg | ORAL_TABLET | Freq: Two times a day (BID) | ORAL | Status: DC
  Administered 2015-11-11 – 2015-11-13 (×4): 25 mg via ORAL
  Filled 2015-11-11 (×4): qty 1

## 2015-11-11 MED ORDER — ATORVASTATIN CALCIUM 80 MG PO TABS
80.0000 mg | ORAL_TABLET | Freq: Every day | ORAL | Status: DC
  Administered 2015-11-11 – 2015-11-12 (×2): 80 mg via ORAL
  Filled 2015-11-11 (×2): qty 1

## 2015-11-11 MED ORDER — EZETIMIBE 10 MG PO TABS
10.0000 mg | ORAL_TABLET | Freq: Every day | ORAL | Status: DC
  Administered 2015-11-12 – 2015-11-13 (×2): 10 mg via ORAL
  Filled 2015-11-11 (×2): qty 1

## 2015-11-11 MED ORDER — NITROGLYCERIN 0.4 MG SL SUBL
0.4000 mg | SUBLINGUAL_TABLET | SUBLINGUAL | Status: AC | PRN
  Administered 2015-11-11 (×3): 0.4 mg via SUBLINGUAL
  Filled 2015-11-11: qty 1

## 2015-11-11 MED ORDER — MORPHINE SULFATE (PF) 2 MG/ML IV SOLN: 2.0000 mg | INTRAVENOUS | Status: DC | PRN

## 2015-11-11 MED ORDER — ASPIRIN 81 MG PO CHEW
324.0000 mg | CHEWABLE_TABLET | Freq: Once | ORAL | Status: AC
  Administered 2015-11-11: 324 mg via ORAL
  Filled 2015-11-11: qty 4

## 2015-11-11 MED ORDER — ASPIRIN EC 81 MG PO TBEC
81.0000 mg | DELAYED_RELEASE_TABLET | Freq: Every day | ORAL | Status: DC
  Administered 2015-11-11 – 2015-11-13 (×3): 81 mg via ORAL
  Filled 2015-11-11 (×4): qty 1

## 2015-11-11 MED ORDER — HEPARIN (PORCINE) IN NACL 100-0.45 UNIT/ML-% IJ SOLN
800.0000 [IU]/h | INTRAMUSCULAR | Status: DC
  Administered 2015-11-11: 800 [IU]/h via INTRAVENOUS
  Filled 2015-11-11: qty 250

## 2015-11-11 MED ORDER — HEPARIN BOLUS VIA INFUSION
4000.0000 [IU] | Freq: Once | INTRAVENOUS | Status: AC
  Administered 2015-11-11: 4000 [IU] via INTRAVENOUS
  Filled 2015-11-11: qty 4000

## 2015-11-11 MED ORDER — HYDROCHLOROTHIAZIDE 25 MG PO TABS
12.5000 mg | ORAL_TABLET | Freq: Every day | ORAL | Status: DC
Start: 2015-11-11 — End: 2015-11-12
  Administered 2015-11-11: 12.5 mg via ORAL
  Filled 2015-11-11: qty 1

## 2015-11-11 MED ORDER — LAMOTRIGINE 150 MG PO TABS
150.0000 mg | ORAL_TABLET | Freq: Every day | ORAL | Status: DC
  Administered 2015-11-12 – 2015-11-13 (×2): 150 mg via ORAL
  Filled 2015-11-11 (×2): qty 1

## 2015-11-11 NOTE — H&P (Signed)
History and Physical  Patient Name: Darren [name]     UJW:[id number]    DOB: January 10, 1971    DOA: 11/11/2015 PCP: Jaclyn Shaggy, MD  Cardiologist: Armanda Magic, MD    Patient coming from: Home     Chief Complaint: Chest pain  HPI: Darren [name] is a 45 y.o. male with a past medical history significant for CAD s/p DES to LAD 02/2014, HTN, and smoking who presents with chest pain.  The patient was in his usual state of health until about a month ago when he started to have onset of transient shortness of breath while working. Over the last 2 weeks now, he has noticed hot flashes at night, spells of nausea and shortness of breathas well as an intermittent left-sided "squeezing" or "sharp" chest pain, moderate in intensity reminiscent of his NSTEMI in 2016.  The pain is worse with exertion or emotional stress (he is dealing with his daughter's mother, which is stressful).  The pain is not worse with food, position.  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  Tonight, in the shower, he felt malaise and chest pain and dizzy and so he had his neighbor bring him to the ER.  ED course: -Afebrile, heart rate 70s, respirations and pulse oximetry normal, BP 176/86 initially -Initial ECG showed early repolarization and LVH with strain pattern and troponin was negative (had had several hours of pain on arrival). -Na 137, K 3.6, Cr 1.0, WBC 9.4, Hgb 13.8 -CXR showed findings consistent with COPD -The pain resolved with rest in the ER and [facility] was asked to admit for observation, serial troponins and risk stratification.  The patient had an NSTEMI in January 2016, DS placed to the LAD, within a few weeks, returned with identical chest pain (squeezing or sharp left-sided chest pain with nausea, hot flashes and shortness of breath), but repeat LHC at that time was clear.  Of note, he lost  his insurance in February of this year and was without all medicines for 6 months, until he regained insurance a few months ago and restarted his BP and cholesterol and antiplatelets meds which he tells me he has been taking.        Review of Systems:  All other systems negative except as just noted or noted in the history of present illness.  Past Medical History:  Diagnosis Date  . Anxiety   . Aortic insufficiency    moderate by echo 04/2014  . Coronary artery disease 03/24/2014   NSTEMI s/p PCI of mid LAD and repeat cath with patent stent  . Depression   . Hypertension   . Tobacco abuse     Past Surgical History:  Procedure Laterality Date  . CARDIAC CATHETERIZATION  03/04/2014   PCI of mid LAD  . LEFT HEART CATHETERIZATION WITH CORONARY ANGIOGRAM Bilateral 03/04/2014   Procedure: LEFT HEART CATHETERIZATION WITH CORONARY ANGIOGRAM;  Surgeon: Kathleene Hazel, MD;  Location: [facility] CATH LAB;  Service: Cardiovascular;  Laterality: Bilateral;  . LEFT HEART CATHETERIZATION WITH CORONARY ANGIOGRAM N/A 03/26/2014   Procedure: LEFT HEART CATHETERIZATION WITH CORONARY ANGIOGRAM;  Surgeon: Lennette Bihari, MD;  Location: [facility] CATH LAB;  Service: Cardiovascular;  Laterality: N/A;    Social History: Patient works as a Administrator.  Patient walks unassisted. He smokes currently.    Allergies  Allergen Reactions  . Hydrocodone Hives  Family history: family history includes Alcohol abuse in his father; Diabetes in his other; Heart attack in his other; Hypertension in his father, mother, and other.  Prior to Admission medications   Medication Sig Start Date End Date Taking? Authorizing Provider  acetaminophen (TYLENOL) 500 MG tablet Take 1-2 tablets (500-1,000 mg total) by mouth every 6 (six) hours as needed for mild pain, moderate pain, fever or headache. 11/10/15  Yes Trixie Dredge, PA-C  aspirin EC 81 MG tablet Take 81 mg by mouth every morning.   Yes Historical Provider, MD  atorvastatin  (LIPITOR) 80 MG tablet Take 1 tablet (80 mg total) by mouth daily at 6 PM. For high cholesterol 06/19/14  Yes Sanjuana Kava, NP  clopidogrel (PLAVIX) 75 MG tablet Take 1 tablet (75 mg total) by mouth daily. 10/06/15  Yes Doug Sou, MD  ezetimibe (ZETIA) 10 MG tablet Take 1 tablet (10 mg total) by mouth daily. 10/25/15  Yes Jaclyn Shaggy, MD  guaiFENesin-dextromethorphan (ROBITUSSIN DM) 100-10 MG/5ML syrup Take 5 mLs by mouth every 4 (four) hours as needed for cough (and congestion). 11/10/15  Yes Trixie Dredge, PA-C  hydrochlorothiazide (HYDRODIURIL) 25 MG tablet Take 0.5 tablets (12.5 mg total) by mouth daily. 10/06/15  Yes Doug Sou, MD  ibuprofen (ADVIL,MOTRIN) 200 MG tablet Take 400 mg by mouth 2 (two) times daily as needed for moderate pain.   Yes Historical Provider, MD  lamoTRIgine (LAMICTAL) 150 MG tablet Take 1 tablet (150 mg total) by mouth daily. 04/20/15  Yes Cleotis Nipper, MD  metoprolol (LOPRESSOR) 50 MG tablet Take 0.5 tablets (25 mg total) by mouth 2 (two) times daily. 10/06/15  Yes Doug Sou, MD  pantoprazole (PROTONIX) 20 MG tablet Take 2 tablets (40 mg total) by mouth daily. 10/06/15  Yes Doug Sou, MD  traZODone (DESYREL) 50 MG tablet Take 1 tablet (50 mg total) by mouth at bedtime as needed for sleep. 11/19/14  Yes Cleotis Nipper, MD  aspirin 81 MG chewable tablet Chew 1 tablet (81 mg total) by mouth daily. Patient not taking: Reported on 11/11/2015 10/06/15   Doug Sou, MD  benzonatate (TESSALON) 100 MG capsule Take 1 capsule (100 mg total) by mouth 2 (two) times daily as needed for cough. 11/10/15   Trixie Dredge, PA-C  buPROPion (WELLBUTRIN SR) 150 MG 12 hr tablet Take 1 tablet (150 mg total) by mouth 2 (two) times daily. For smoking cessation 10/14/15   Jaclyn Shaggy, MD  hydrOXYzine (ATARAX/VISTARIL) 25 MG tablet Take 1 tablet (25 mg total) by mouth 3 (three) times daily with meals as needed for anxiety. 04/20/15   Cleotis Nipper, MD  metoprolol tartrate (LOPRESSOR) 25 MG tablet  Take 0.5 tablets (12.5 mg total) by mouth 2 (two) times daily. For high blood pressure Patient not taking: Reported on 11/11/2015 06/19/14   Sanjuana Kava, NP  pantoprazole (PROTONIX) 40 MG tablet Take 1 tablet (40 mg total) by mouth daily. For acid reflux Patient not taking: Reported on 11/11/2015 06/19/14   Sanjuana Kava, NP       Physical Exam: BP 129/69   Pulse (!) 46   Temp 98.9 F (37.2 C) (Oral)   Resp 16   SpO2 100%  General appearance: Well-developed, adult male, alert and in mild distress from pain.   Eyes: Anicteric, conjunctiva pink, lids and lashes normal.     ENT: No nasal deformity, discharge, or epistaxis.  OP moist without lesions.   Skin: Warm and dry.   Cardiac: RRR, nl  S1-S2, no murmurs appreciated, ?S4 gallop.  Capillary refill is brisk.  JVP normal.  No LE edema.  Radial and DP pulses 2+ and symmetric.  No carotid bruits. Respiratory: Normal respiratory rate and rhythm.  Soft expiratory wheezes diffusely. GI: Abdomen soft without rigidity.  No TTP. No ascites, distension.   MSK: No deformities or effusions.   Pain not reproduced with palpation of precordium.  No pain with arm movement. Neuro: Sensorium intact and responding to questions, attention normal.  Speech is fluent.  Moves all extremities equally and with normal coordination.    Psych: Behavior appropriate.  Affect normal.  No evidence of aural or visual hallucinations or delusions.       Labs on Admission:  The metabolic panel shows normal electrolytes and renal function. The complete blood count shows no leukocytosis, anemia, thrombocytopenia. The initial troponin is negative.  Radiological Exams on Admission: Personally reviewed, CXR shows large lung volumes and increased interstitial markings, c/w COPD: Dg Chest 2 View  Result Date: 11/11/2015 CLINICAL DATA:  Left lateral chest pain, shortness of breath, congestion, productive cough, and nausea and vomiting for the past 10 days. Current smoker.  History of previous MI and, aortic insufficiency. EXAM: CHEST  2 VIEW COMPARISON:  PA and lateral chest x-ray of November 10, 2015 FINDINGS: The lungs are mildly hyperinflated and clear. The heart and pulmonary vascularity are normal. The mediastinum is normal in width. There is no pleural effusion. The bony thorax exhibits no acute abnormality. IMPRESSION: Hyperinflation consistent with chronic bronchitic change in the patient's smoking history. No CHF, pneumonia, nor other acute cardiopulmonary abnormality. Electronically Signed   By: David  Swaziland M.D.   On: 11/11/2015 15:57   Dg Chest 2 View  Result Date: 11/10/2015 CLINICAL DATA:  Productive cough and chest pain for 2 days. Hypertension. EXAM: CHEST  2 VIEW COMPARISON:  10/06/2015 FINDINGS: The heart size and mediastinal contours are within normal limits. Both lungs are clear. The visualized skeletal structures are unremarkable. IMPRESSION: Negative.  No active cardiopulmonary disease. Electronically Signed   By: Myles Rosenthal M.D.   On: 11/10/2015 18:33    EKG: Independently reviewed. rrate 70, QTC 416, early repolarization pattern, old inferior ST depressions, LVH with strain pattern.  Echocardiogram 2016: EF 55-60%, no significant valvular disease         Assessment/Plan 1. Accelerating angina: This is new.  The patient has HEART score of 6. Angina is suspected, accelerating, possible ACS, although patient does have history of same after his stent that turned out to be nothing.  Other potential causes of chest pain (PE, dissection, pancreatitis, pneumonia/effusion, pericarditis) are doubted.  We have been asked to admit the patient for observation and etiology consultation with Cardiology tomorrow.  -Serial troponins are ordered -Telemetry -Start heparin gtt for now -Consult to cardiology, appreciate recommendations -Smoking cessation was recommended, nursing teaching ordered (will start Wellbutrin with his PCP after  discharge)    2. HTN:  Improving to normal in ED. -Continue metoprolol and HCTZ  3. CAD:  -Continue statin, Zetia, aspirin -Continue Plavix for now -Consult to Cardiology  4. Mood:  -Continue Lamictal  5. Possible COPD:  Wheezing and CXR consistent.  Smoker.  No formal dx.  No inhalers on medication list. -Smoking cessation recommended -If still SOB at discharge, consider beta agonist        DVT prophylaxis: None needed, on heparin therapeutic dose Diet: NPO after 4am for anticipated stress testing Code Status: Full  Family Communication: None present  Disposition  Plan: Anticipate overnight observation for arrhythmia on telemetry, serial troponins and subsequent risk stratification by Cardiology.  If testing negative, home after. Consults called: Dr. Mayford Knifeurner, overnight Admission status: Telemetry, OBS   Medical decision making: Patient seen at 7:35 PM on 11/11/2015.  The patient was discussed with Dr. Mayford Knifeurner from Cardiology and Fayrene HelperBowie Tran, PA_C. What exists of the patient's chart was reviewed in depth.  Clinical condition: stable.      Alberteen SamChristopher P Martyna Thorns [facility] Pager [phone]

## 2015-11-11 NOTE — Progress Notes (Signed)
ANTICOAGULATION CONSULT NOTE - Initial Consult  Pharmacy Consult for Heparin Indication: chest pain/ACS  Allergies  Allergen Reactions  . Hydrocodone Hives    Patient Measurements:   Heparin Dosing Weight:   Vital Signs: Temp: 98.9 F (37.2 C) (09/14 1519) Temp Source: Oral (09/14 1519) BP: 129/69 (09/14 1915) Pulse Rate: 62 (09/14 1830)  Labs:  Recent Labs  11/10/15 1840 11/11/15 1527  HGB 15.6 13.8  HCT 46.0 41.1  PLT  --  250  CREATININE 1.20 1.01    CrCl cannot be calculated (Unknown ideal weight.).   Medical History: Past Medical History:  Diagnosis Date  . Anxiety   . Aortic insufficiency    moderate by echo 04/2014  . Coronary artery disease 03/24/2014   NSTEMI s/p PCI of mid LAD and repeat cath with patent stent  . Depression   . Hypertension   . Tobacco abuse     Medications:   (Not in a hospital admission) Scheduled:  . hydrochlorothiazide  12.5 mg Oral Daily  . metoprolol  25 mg Oral BID   Infusions:    Assessment: 45yo male presents with CP. Pharmacy is consulted to dose heparin for ACS/chest pain.   Goal of Therapy:  Heparin level 0.3-0.7 units/ml Monitor platelets by anticoagulation protocol: Yes   Plan:  Give 4000 units bolus x 1 Start heparin infusion at 800 units/hr Check anti-Xa level in 6 hours and daily while on heparin Continue to monitor H&H and platelets  Arlean Hoppingorey M. Newman PiesBall, PharmD, BCPS Clinical Pharmacist Pager [phone] 11/11/2015,8:25 PM

## 2015-11-11 NOTE — ED Provider Notes (Signed)
[facility]-EMERGENCY DEPT Provider Note   CSN: [id number] Arrival date & time: 11/11/15  1515     History   Chief Complaint Chief Complaint  Patient presents with  . Chest Pain  . URI    HPI Janeece AgeeCharles A Huttner is a 45 y.o. male.  HPI   45 year old male with history of prior NSTEMI (DES of mLAD Jan2016),  anxiety, depression, hypertension, tobacco abuse presenting with complaints of chest pain. Patient was seen in the ED yesterday with URI symptoms which includes congestion, cough productive with white sputum, sore throat, occasional nausea,. Pain with cough that is not improved with over-the-counter medication. During that visit he also complaining of left-sided chest pain and left arm heaviness that felt similar to his prior MI. His cardiac workup was unremarkable, including negative troponin. His EKG without acute ischemic changes. His cardiologist is Dr. Mayford Knifeurner and she was consulted .  She recommended for patient to follow-up in office for further care. Today he continues to endorse similar chest pain. He describes pain as a pressure sensation to his left side of chest, radiates towards his left jaw and neck and down his left arm with associated lightheadedness, dizziness, diaphoretic, and felt nauseous. Pain is currently 7 out of 10 and has been ongoing for the past 5-6 hours. No specific treatment tried. Pain feels similar to prior MI that he was diagnosed in 2016. He reports similar pain for the past week and half usually brought on by exertion. 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 He is a smoker currently trying to quit. He denies any cocaine use. No fever, abdominal pain, back pain, or rash.  Past Medical History:  Diagnosis Date  . Anxiety   . Aortic insufficiency    moderate by echo 04/2014  . Coronary artery disease 03/24/2014   NSTEMI s/p PCI of mid LAD and repeat cath with patent stent  . Depression   . Hypertension   . Tobacco abuse     Patient Active Problem List     Diagnosis Date Noted  . Aortic insufficiency   . MDD (major depressive disorder) (HCC) 08/06/2014  . ETOH abuse 08/06/2014  . Depression   . Coronary artery disease due to lipid rich plaque   . Hypertensive heart disease 03/04/2014  . Tobacco abuse 03/04/2014  . NSTEMI (non-ST elevated myocardial infarction) (HCC) 03/02/2014    Past Surgical History:  Procedure Laterality Date  . CARDIAC CATHETERIZATION  03/04/2014   PCI of mid LAD  . LEFT HEART CATHETERIZATION WITH CORONARY ANGIOGRAM Bilateral 03/04/2014   Procedure: LEFT HEART CATHETERIZATION WITH CORONARY ANGIOGRAM;  Surgeon: Kathleene Hazelhristopher D McAlhany, MD;  [facility];  Service: Cardiovascular;  Laterality: Bilateral;  . LEFT HEART CATHETERIZATION WITH CORONARY ANGIOGRAM N/A 03/26/2014   Procedure: LEFT HEART CATHETERIZATION WITH CORONARY ANGIOGRAM;  Surgeon: Lennette Biharihomas A Kelly, MD;  [facility];  Service: Cardiovascular;  Laterality: N/A;       Home Medications    Prior to Admission medications   Medication Sig Start Date End Date Taking? Authorizing Provider  acetaminophen (TYLENOL) 500 MG tablet Take 1-2 tablets (500-1,000 mg total) by mouth every 6 (six) hours as needed for mild pain, moderate pain, fever or headache. 11/10/15   Trixie DredgeEmily West, PA-C  aspirin 81 MG chewable tablet Chew 1 tablet (81 mg total) by mouth daily. Patient not taking: Reported on 10/14/2015 10/06/15   Doug SouSam Jacubowitz, MD  atorvastatin (LIPITOR) 80 MG tablet Take 1 tablet (80 mg total) by  mouth daily at 6 PM. For high cholesterol Patient not taking: Reported on 10/14/2015 06/19/14   Sanjuana Kava, NP  benzonatate (TESSALON) 100 MG capsule Take 1 capsule (100 mg total) by mouth 2 (two) times daily as needed for cough. 11/10/15   Trixie Dredge, PA-C  buPROPion (WELLBUTRIN SR) 150 MG 12 hr tablet Take 1 tablet (150 mg total) by mouth 2 (two) times daily. For smoking cessation 10/14/15   Jaclyn Shaggy, MD  clopidogrel (PLAVIX) 75 MG tablet Take 1 tablet (75  mg total) by mouth daily. Patient not taking: Reported on 10/14/2015 10/06/15   Doug Sou, MD  ezetimibe (ZETIA) 10 MG tablet Take 1 tablet (10 mg total) by mouth daily. 10/25/15   Jaclyn Shaggy, MD  guaiFENesin-dextromethorphan (ROBITUSSIN DM) 100-10 MG/5ML syrup Take 5 mLs by mouth every 4 (four) hours as needed for cough (and congestion). 11/10/15   Trixie Dredge, PA-C  hydrochlorothiazide (HYDRODIURIL) 25 MG tablet Take 0.5 tablets (12.5 mg total) by mouth daily. Patient not taking: Reported on 10/14/2015 10/06/15   Doug Sou, MD  hydrOXYzine (ATARAX/VISTARIL) 25 MG tablet Take 1 tablet (25 mg total) by mouth 3 (three) times daily with meals as needed for anxiety. Patient not taking: Reported on 10/14/2015 04/20/15   Cleotis Nipper, MD  lamoTRIgine (LAMICTAL) 150 MG tablet Take 1 tablet (150 mg total) by mouth daily. Patient not taking: Reported on 10/14/2015 04/20/15   Cleotis Nipper, MD  metoprolol (LOPRESSOR) 50 MG tablet Take 0.5 tablets (25 mg total) by mouth 2 (two) times daily. Patient not taking: Reported on 10/14/2015 10/06/15   Doug Sou, MD  metoprolol tartrate (LOPRESSOR) 25 MG tablet Take 0.5 tablets (12.5 mg total) by mouth 2 (two) times daily. For high blood pressure Patient not taking: Reported on 10/14/2015 06/19/14   Sanjuana Kava, NP  pantoprazole (PROTONIX) 20 MG tablet Take 2 tablets (40 mg total) by mouth daily. Patient not taking: Reported on 10/14/2015 10/06/15   Doug Sou, MD  pantoprazole (PROTONIX) 40 MG tablet Take 1 tablet (40 mg total) by mouth daily. For acid reflux Patient not taking: Reported on 10/14/2015 06/19/14   Sanjuana Kava, NP  traZODone (DESYREL) 50 MG tablet Take 1 tablet (50 mg total) by mouth at bedtime as needed for sleep. Patient not taking: Reported on 10/14/2015 11/19/14   Cleotis Nipper, MD    Family History Family History  Problem Relation Age of Onset  . Hypertension Mother   . Hypertension Father   . Alcohol abuse Father   . Hypertension Other    . Diabetes Other   . Heart attack Other     Social History Social History  Substance Use Topics  . Smoking status: Current Every Day Smoker    Packs/day: 1.00    Years: 0.00    Types: Cigarettes  . Smokeless tobacco: Never Used  . Alcohol use 1.2 - 1.8 oz/week    2 - 3 Cans of beer per week     Comment: occ     Allergies   Hydrocodone   Review of Systems Review of Systems  All other systems reviewed and are negative.    Physical Exam Updated Vital Signs BP 188/87 (BP Location: Right Arm)   Pulse 62   Temp 98.9 F (37.2 C) (Oral)   Resp 16   SpO2 100%   Physical Exam  Constitutional: He appears well-developed and well-nourished. No distress.  HENT:  Head: Atraumatic.  Eyes: Conjunctivae are normal.  Neck: Neck  supple.  Cardiovascular: Normal rate, regular rhythm and intact distal pulses.   Pulmonary/Chest: Effort normal. He has wheezes (Faint expiratory wheezes).  Abdominal: Soft. There is no tenderness.  Musculoskeletal: He exhibits no edema.  Neurological: He is alert.  Skin: No rash noted.  Psychiatric: He has a normal mood and affect.  Nursing note and vitals reviewed.    ED Treatments / Results  Labs (all labs ordered are listed, but only abnormal results are displayed) Labs Reviewed  BASIC METABOLIC PANEL - Abnormal; Notable for the following:       Result Value   Glucose, Bld 115 (*)    Calcium 8.3 (*)    All other components within normal limits  CBC  I-STAT TROPOININ, ED    EKG  EKG Interpretation None     ED ECG REPORT   Date: 11/11/2015  Rate: 70  Rhythm: normal sinus rhythm  QRS Axis: normal  Intervals: normal  ST/T Wave abnormalities: nonspecific ST/T changes  Conduction Disutrbances:biatrial enlargement.  left LVH  Narrative Interpretation:   Old EKG Reviewed: unchanged  I have personally reviewed the EKG tracing and agree with the computerized printout as noted.   Radiology Dg Chest 2 View  Result Date:  11/11/2015 CLINICAL DATA:  Left lateral chest pain, shortness of breath, congestion, productive cough, and nausea and vomiting for the past 10 days. Current smoker. History of previous MI and, aortic insufficiency. EXAM: CHEST  2 VIEW COMPARISON:  PA and lateral chest x-ray of November 10, 2015 FINDINGS: The lungs are mildly hyperinflated and clear. The heart and pulmonary vascularity are normal. The mediastinum is normal in width. There is no pleural effusion. The bony thorax exhibits no acute abnormality. IMPRESSION: Hyperinflation consistent with chronic bronchitic change in the patient's smoking history. No CHF, pneumonia, nor other acute cardiopulmonary abnormality. Electronically Signed   By: David  [place]iland M.D.   On: 11/11/2015 15:57   Dg Chest 2 View  Result Date: 11/10/2015 CLINICAL DATA:  Productive cough and chest pain for 2 days. Hypertension. EXAM: CHEST  2 VIEW COMPARISON:  10/06/2015 FINDINGS: The heart size and mediastinal contours are within normal limits. Both lungs are clear. The visualized skeletal structures are unremarkable. IMPRESSION: Negative.  No active cardiopulmonary disease. Electronically Signed   By: Myles Rosenthal M.D.   On: 11/10/2015 18:33    Procedures Procedures (including critical care time)  Medications Ordered in ED Medications  aspirin chewable tablet 324 mg (324 mg Oral Given 11/11/15 1817)  nitroGLYCERIN (NITROSTAT) SL tablet 0.4 mg (0.4 mg Sublingual Given 11/11/15 1850)     Initial Impression / Assessment and Plan / ED Course  I have reviewed the triage vital signs and the nursing notes.  Pertinent labs & imaging results that were available during my care of the patient were reviewed by me and considered in my medical decision making (see chart for details).  Clinical Course    BP 157/82   Pulse 63   Temp 98.9 F (37.2 C) (Oral)   Resp 11   SpO2 100%    Final Clinical Impressions(s) / ED Diagnoses   Final diagnoses:  Angina pectoris (HCC)     New Prescriptions New Prescriptions   No medications on file   5:49 PM Patient presents with anginal pain. Has significant cardiac history including prior MI. He was seen in ED yesterday for similar complaint has a scheduled appointment with cardiologist tomorrow but he is here due to worsening chest pain. His initial EKG without acute ischemic changes.  Evidence of ST depression in the inferior leads which is not new. Initial set of troponins negative. Will give patient aspirin, SL nitroglycerin, and will consult cardiology for further management.  6:35 PM After pt received ASA and SL nitro, he report improvement of his pain from 7/10 to 3/10.  Care discussed with Dr. Corlis Leak.  Plan to consult Card for further care.   6:59 PM CP is now resolved with SL x3.  Pt appears comfortable.  Currently awaits Cardiology to call me back.   7:10 PM Appreciate consultation from cardiologist Dr. Mayford Knife, who have reviewed pt's note and ECG.  She felt no significant ECG changes, just early repol.  Pt's CP in the past response to nitro.  She recommend medicine admission for chest pain r/o.  Pt NPO tonight and she will see pt tomorrow for cardiac stress test.  Will consult medicine.    7:47 PM I have consulted [facility] Dr. Maryfrances Bunnell who agrees to see pt and will admit to obs under his care.    Fayrene Helper, PA-C 11/11/15 1948    Courteney Randall An, MD 11/12/15 0003

## 2015-11-11 NOTE — ED Triage Notes (Signed)
Pt states here for chest pain for 1.5 weeks and radiates to neck. Pt reports some sob and states he also is here for cold symptoms

## 2015-11-11 NOTE — ED Notes (Signed)
Attempted report x1. 

## 2015-11-11 NOTE — Telephone Encounter (Signed)
Scheduled patient for evaluation tomorrow with Ronie Spiesayna Dunn. He was grateful for call.

## 2015-11-12 ENCOUNTER — Ambulatory Visit: Payer: Self-pay | Admitting: Physician Assistant

## 2015-11-12 ENCOUNTER — Encounter (HOSPITAL_COMMUNITY)
Admission: EM | Disposition: A | Discharge status: Home / Self Care | Payer: Self-pay | Source: Home / Self Care | Attending: Physician Assistant

## 2015-11-12 DIAGNOSIS — I209 Angina pectoris, unspecified: Secondary | ICD-10-CM

## 2015-11-12 DIAGNOSIS — I10 Essential (primary) hypertension: Secondary | ICD-10-CM

## 2015-11-12 DIAGNOSIS — I2510 Atherosclerotic heart disease of native coronary artery without angina pectoris: Secondary | ICD-10-CM

## 2015-11-12 DIAGNOSIS — Z720 Tobacco use: Secondary | ICD-10-CM | POA: Diagnosis not present

## 2015-11-12 HISTORY — PX: CARDIAC CATHETERIZATION: SHX172

## 2015-11-12 LAB — PROTIME-INR
INR: 1.05
PROTHROMBIN TIME: 13.7 s (ref 11.4–15.2)

## 2015-11-12 LAB — TROPONIN I: TROPONIN I: 0.04 ng/mL — AB (ref <0.03)

## 2015-11-12 LAB — HEPARIN LEVEL (UNFRACTIONATED)
HEPARIN UNFRACTIONATED: 0.34 [IU]/mL (ref 0.30–0.70)
Heparin Unfractionated: 0.48 IU/mL (ref 0.30–0.70)

## 2015-11-12 LAB — CBC
HCT: 40.3 % (ref 39.0–52.0)
Hemoglobin: 13.3 g/dL (ref 13.0–17.0)
MCH: 29 pg (ref 26.0–34.0)
MCHC: 33 g/dL (ref 30.0–36.0)
MCV: 88 fL (ref 78.0–100.0)
PLATELETS: 238 10*3/uL (ref 150–400)
RBC: 4.58 MIL/uL (ref 4.22–5.81)
RDW: 14.7 % (ref 11.5–15.5)
WBC: 7.1 10*3/uL (ref 4.0–10.5)

## 2015-11-12 LAB — TSH: TSH: 0.554 u[IU]/mL (ref 0.350–4.500)

## 2015-11-12 LAB — LIPID PANEL
CHOL/HDL RATIO: 3 ratio
CHOLESTEROL: 127 mg/dL (ref 0–200)
HDL: 42 mg/dL (ref >40)
LDL Cholesterol: 74 mg/dL (ref 0–99)
Triglycerides: 55 mg/dL (ref <150)
VLDL: 11 mg/dL (ref 0–40)

## 2015-11-12 LAB — RAPID URINE DRUG SCREEN, HOSP PERFORMED
AMPHETAMINES: NOT DETECTED
BARBITURATES: NOT DETECTED
Benzodiazepines: POSITIVE — AB
Cocaine: NOT DETECTED
Opiates: NOT DETECTED
TETRAHYDROCANNABINOL: POSITIVE — AB

## 2015-11-12 LAB — D-DIMER, QUANTITATIVE: D-Dimer, Quant: 0.51 ug/mL-FEU — ABNORMAL HIGH (ref 0.00–0.50)

## 2015-11-12 SURGERY — LEFT HEART CATH AND CORONARY ANGIOGRAPHY
Anesthesia: LOCAL

## 2015-11-12 MED ORDER — LIDOCAINE HCL (PF) 1 % IJ SOLN
INTRAMUSCULAR | Status: AC
  Filled 2015-11-12: qty 30

## 2015-11-12 MED ORDER — IOPAMIDOL (ISOVUE-370) INJECTION 76%
INTRAVENOUS | Status: AC
  Filled 2015-11-12: qty 100

## 2015-11-12 MED ORDER — FENTANYL CITRATE (PF) 100 MCG/2ML IJ SOLN
INTRAMUSCULAR | Status: AC
  Filled 2015-11-12: qty 2

## 2015-11-12 MED ORDER — VERAPAMIL HCL 2.5 MG/ML IV SOLN
INTRAVENOUS | Status: DC | PRN
  Administered 2015-11-12: 10 mL via INTRA_ARTERIAL

## 2015-11-12 MED ORDER — LIDOCAINE HCL (PF) 1 % IJ SOLN
INTRAMUSCULAR | Status: DC | PRN
  Administered 2015-11-12: 2 mL

## 2015-11-12 MED ORDER — SODIUM CHLORIDE 0.9% FLUSH: 3.0000 mL | Freq: Two times a day (BID) | INTRAVENOUS | Status: DC

## 2015-11-12 MED ORDER — IOPAMIDOL (ISOVUE-370) INJECTION 76%
INTRAVENOUS | Status: DC | PRN
  Administered 2015-11-12: 50 mL

## 2015-11-12 MED ORDER — MIDAZOLAM HCL 2 MG/2ML IJ SOLN
INTRAMUSCULAR | Status: DC | PRN
  Administered 2015-11-12: 2 mg via INTRAVENOUS

## 2015-11-12 MED ORDER — HYDROCHLOROTHIAZIDE 25 MG PO TABS
25.0000 mg | ORAL_TABLET | Freq: Every day | ORAL | Status: DC
  Administered 2015-11-12 – 2015-11-13 (×2): 25 mg via ORAL
  Filled 2015-11-12 (×2): qty 1

## 2015-11-12 MED ORDER — HEPARIN (PORCINE) IN NACL 2-0.9 UNIT/ML-% IJ SOLN
INTRAMUSCULAR | Status: DC | PRN
  Administered 2015-11-12: 15:00:00

## 2015-11-12 MED ORDER — VERAPAMIL HCL 2.5 MG/ML IV SOLN
INTRAVENOUS | Status: AC
  Filled 2015-11-12: qty 2

## 2015-11-12 MED ORDER — MIDAZOLAM HCL 2 MG/2ML IJ SOLN
INTRAMUSCULAR | Status: AC
  Filled 2015-11-12: qty 2

## 2015-11-12 MED ORDER — SODIUM CHLORIDE 0.9% FLUSH: 3.0000 mL | INTRAVENOUS | Status: DC | PRN

## 2015-11-12 MED ORDER — SODIUM CHLORIDE 0.9 % IV SOLN: INTRAVENOUS | Status: DC

## 2015-11-12 MED ORDER — FENTANYL CITRATE (PF) 100 MCG/2ML IJ SOLN
INTRAMUSCULAR | Status: DC | PRN
  Administered 2015-11-12: 50 ug via INTRAVENOUS

## 2015-11-12 MED ORDER — ALBUTEROL SULFATE (2.5 MG/3ML) 0.083% IN NEBU
2.5000 mg | INHALATION_SOLUTION | RESPIRATORY_TRACT | Status: DC | PRN
  Administered 2015-11-12: 2.5 mg via RESPIRATORY_TRACT

## 2015-11-12 MED ORDER — HEPARIN SODIUM (PORCINE) 1000 UNIT/ML IJ SOLN
INTRAMUSCULAR | Status: AC
  Filled 2015-11-12: qty 1

## 2015-11-12 MED ORDER — HEPARIN SODIUM (PORCINE) 1000 UNIT/ML IJ SOLN
INTRAMUSCULAR | Status: DC | PRN
  Administered 2015-11-12: 4000 [IU] via INTRAVENOUS

## 2015-11-12 MED ORDER — HEPARIN (PORCINE) IN NACL 2-0.9 UNIT/ML-% IJ SOLN
INTRAMUSCULAR | Status: AC
  Filled 2015-11-12: qty 1500

## 2015-11-12 MED ORDER — ASPIRIN 81 MG PO CHEW: 81.0000 mg | CHEWABLE_TABLET | ORAL | Status: DC

## 2015-11-12 MED ORDER — SODIUM CHLORIDE 0.9 % IV SOLN: 250.0000 mL | INTRAVENOUS | Status: DC | PRN

## 2015-11-12 SURGICAL SUPPLY — 9 items
CATH IMPULSE 5F ANG/FL3.5 (CATHETERS) ×2 IMPLANT
DEVICE RAD COMP TR BAND LRG (VASCULAR PRODUCTS) ×2 IMPLANT
GLIDESHEATH SLEND SS 6F .021 (SHEATH) ×2 IMPLANT
KIT HEART LEFT (KITS) ×2 IMPLANT
PACK CARDIAC CATHETERIZATION (CUSTOM PROCEDURE TRAY) ×2 IMPLANT
SYR MEDRAD MARK V 150ML (SYRINGE) ×2 IMPLANT
TRANSDUCER W/STOPCOCK (MISCELLANEOUS) ×2 IMPLANT
TUBING CIL FLEX 10 FLL-RA (TUBING) ×2 IMPLANT
WIRE SAFE-T 1.5MM-J .035X260CM (WIRE) ×2 IMPLANT

## 2015-11-12 NOTE — H&P (View-Only) (Signed)
CARDIOLOGY CONSULT NOTE   Patient ID: Darren [name] MRN: [id number] DOB/AGE: 46-Aug-1972 46 y.o.  Admit date: 11/11/2015  Primary Physician   REDMON,NOELLE, PA-C Primary Cardiologist   Dr. Mayford Knife Reason for Consultation   Chest pain Requesting Physician  Dr. Arthor Captain  HPI: Darren [name] is a 45 y.o. male with a history of HTN and CAD s/p DES  to mLAD (02/2014), moderate aortic insufficiency, poly substance abuse including tobacco and marijuana who presented with CP.   He presetned to [facility] on 03/04/2014 with chest pain and NSTEMI. He was cathed and had ulcerated plaque in the mid LAD that was treated with PCI/DES x 1. Echo revealed LVEF 60-65%, severe concentric LVH, normal wall motion, diastolicdysfunction, normal LV filling pressure, moderate AI, trivial MR. He was discharged on aspirin and Brilinta. He had recurrent chest pain and his symptom was concerning for stent rethrombosis, he was admitted from the office for cardiac catheterization. His serial troponins were negative. He underwent a scheduled cardiac catheterization on 03/26/2014 which revealed EF 50-55%, without focal segmental wall motion abnormality, widely patent mid LAD stent without evidence of restenosis.  He stopped taking all of his medicaton Feb 2017 as he lost his job and insurance. He was compliant before. He was seen in [facility] and wellness Center and his current medication was restarted.   Having intermittent chest pain for the past few weeks. Initially it was exertional now occurs with rest. Associated with SOB, diaphoresis, nausea and radiation to L jaw. This has been getting worse and came to ER for further evaluation. This pain is similar to his prior cardiac pain when he has stent placement.    In ER, BP was elevated. EKG showed sinus rhythm with LVH strain pattern. CXR consistence with bronchitis changes/COPD. Ttroponin x 3 negative. Currently CP on IV heparin.   Also seen in ER 11/10/15 for chest pain and  treated with Albuterol inhaler and cough medications.   Past Medical History:  Diagnosis Date  . Anxiety   . Aortic insufficiency    moderate by echo 04/2014  . Coronary artery disease 03/24/2014   NSTEMI s/p PCI of mid LAD and repeat cath with patent stent  . Depression   . Hypertension   . Tobacco abuse      Past Surgical History:  Procedure Laterality Date  . CARDIAC CATHETERIZATION  03/04/2014   PCI of mid LAD  . LEFT HEART CATHETERIZATION WITH CORONARY ANGIOGRAM Bilateral 03/04/2014   Procedure: LEFT HEART CATHETERIZATION WITH CORONARY ANGIOGRAM;  Surgeon: Kathleene Hazel, MD;  Location: [facility] [place] CATH LAB;  Service: Cardiovascular;  Laterality: Bilateral;  . LEFT HEART CATHETERIZATION WITH CORONARY ANGIOGRAM N/A 03/26/2014   Procedure: LEFT HEART CATHETERIZATION WITH CORONARY ANGIOGRAM;  Surgeon: Lennette Bihari, MD;  Location: [facility] CATH LAB;  Service: Cardiovascular;  Laterality: N/A;    Allergies  Allergen Reactions  . Hydrocodone Hives    I have reviewed the patient's current medications . aspirin EC  81 mg Oral Daily  . atorvastatin  80 mg Oral q1800  . clopidogrel  75 mg Oral Daily  . ezetimibe  10 mg Oral Daily  . hydrochlorothiazide  12.5 mg Oral Daily  . lamoTRIgine  150 mg Oral Daily  . metoprolol  25 mg Oral BID   . heparin 800 Units/hr (11/11/15 2157)   acetaminophen, gi cocktail, nitroGLYCERIN, ondansetron (ZOFRAN) IV, traZODone  Prior to Admission medications   Medication Sig Start Date End Date Taking? Authorizing Provider  acetaminophen (TYLENOL) 500  MG tablet Take 1-2 tablets (500-1,000 mg total) by mouth every 6 (six) hours as needed for mild pain, moderate pain, fever or headache. 11/10/15  Yes Trixie DredgeEmily West, PA-C  aspirin EC 81 MG tablet Take 81 mg by mouth every morning.   Yes Historical Provider, MD  atorvastatin (LIPITOR) 80 MG tablet Take 1 tablet (80 mg total) by mouth daily at 6 PM. For high cholesterol 06/19/14  Yes Sanjuana KavaAgnes I Nwoko, NP  clopidogrel  (PLAVIX) 75 MG tablet Take 1 tablet (75 mg total) by mouth daily. 10/06/15  Yes Doug SouSam Jacubowitz, MD  ezetimibe (ZETIA) 10 MG tablet Take 1 tablet (10 mg total) by mouth daily. 10/25/15  Yes Jaclyn ShaggyEnobong Amao, MD  guaiFENesin-dextromethorphan (ROBITUSSIN DM) 100-10 MG/5ML syrup Take 5 mLs by mouth every 4 (four) hours as needed for cough (and congestion). 11/10/15  Yes Trixie DredgeEmily West, PA-C  hydrochlorothiazide (HYDRODIURIL) 25 MG tablet Take 0.5 tablets (12.5 mg total) by mouth daily. 10/06/15  Yes Doug SouSam Jacubowitz, MD  ibuprofen (ADVIL,MOTRIN) 200 MG tablet Take 400 mg by mouth 2 (two) times daily as needed for moderate pain.   Yes Historical Provider, MD  lamoTRIgine (LAMICTAL) 150 MG tablet Take 1 tablet (150 mg total) by mouth daily. 04/20/15  Yes Cleotis NipperSyed T Arfeen, MD  metoprolol (LOPRESSOR) 50 MG tablet Take 0.5 tablets (25 mg total) by mouth 2 (two) times daily. 10/06/15  Yes Doug SouSam Jacubowitz, MD  pantoprazole (PROTONIX) 20 MG tablet Take 2 tablets (40 mg total) by mouth daily. 10/06/15  Yes Doug SouSam Jacubowitz, MD  traZODone (DESYREL) 50 MG tablet Take 1 tablet (50 mg total) by mouth at bedtime as needed for sleep. 11/19/14  Yes Cleotis NipperSyed T Arfeen, MD  benzonatate (TESSALON) 100 MG capsule Take 1 capsule (100 mg total) by mouth 2 (two) times daily as needed for cough. 11/10/15   Trixie DredgeEmily West, PA-C  buPROPion (WELLBUTRIN SR) 150 MG 12 hr tablet Take 1 tablet (150 mg total) by mouth 2 (two) times daily. For smoking cessation 10/14/15   Jaclyn ShaggyEnobong Amao, MD  hydrOXYzine (ATARAX/VISTARIL) 25 MG tablet Take 1 tablet (25 mg total) by mouth 3 (three) times daily with meals as needed for anxiety. 04/20/15   Cleotis NipperSyed T Arfeen, MD     Social History   Social History  . Marital status: Legally Separated    Spouse name: N/A  . Number of children: N/A  . Years of education: N/A   Occupational History  . Not on file.   Social History Main Topics  . Smoking status: Current Every Day Smoker    Packs/day: 0.50    Years: 30.00    Types: Cigarettes    . Smokeless tobacco: Never Used  . Alcohol use 1.8 oz/week    3 Cans of beer per week  . Drug use:     Types: Marijuana     Comment: 11/11/2015 "a couple times/week"  . Sexual activity: Yes    Birth control/ protection: Condom   Other Topics Concern  . Not on file   Social History Narrative  . No narrative on file    Family Status  Relation Status  . Mother Deceased  . Father Alive  . Brother Alive  . Other    Family History  Problem Relation Age of Onset  . Hypertension Mother   . Hypertension Father   . Alcohol abuse Father   . Hypertension Other   . Diabetes Other   . Heart attack Other     ROS:  Full 14 point review  of systems complete and found to be negative unless listed above.  Physical Exam: Blood pressure (!) 150/66, pulse 61, temperature 98.1 F (36.7 C), temperature source Oral, resp. rate 20, height 5\' 7"  (1.702 m), weight 145 lb 8.1 oz (66 kg), SpO2 100 %.  General: Well developed, well nourished, male in no acute distress Head: Eyes PERRLA, No xanthomas. Normocephalic and atraumatic, oropharynx without edema or exudate.  Lungs: Resp regular and unlabored. Diffuse wheezing on LLL.  Heart: RRR no s3, s4, or murmurs..   Neck: No carotid bruits. No lymphadenopathy.  No JVD. Abdomen: Bowel sounds present, abdomen soft and non-tender without masses or hernias noted. Msk:  No spine or cva tenderness. No weakness, no joint deformities or effusions. Extremities: No clubbing, cyanosis or edema. DP/PT/Radials 2+ and equal bilaterally. Neuro: Alert and oriented X 3. No focal deficits noted. Psych:  Good affect, responds appropriately Skin: No rashes or lesions noted.  Labs:   Lab Results  Component Value Date   WBC 7.1 11/12/2015   HGB 13.3 11/12/2015   HCT 40.3 11/12/2015   MCV 88.0 11/12/2015   PLT 238 11/12/2015   No results for input(s): INR in the last 72 hours.  Recent Labs Lab 11/11/15 1527  NA 137  K 3.6  CL 106  CO2 25  BUN 6   CREATININE 1.01  CALCIUM 8.3*  GLUCOSE 115*   Magnesium  Date Value Ref Range Status  03/25/2014 2.0 1.5 - 2.5 mg/dL Final    Recent Labs  16/10/96 2021 11/12/15 0149  TROPONINI <0.03 <0.03    Recent Labs  11/10/15 2156 11/11/15 1612  TROPIPOC 0.03 0.01   Pro B Natriuretic peptide (BNP)  Date/Time Value Ref Range Status  04/21/2014 12:50 PM 11.0 0.0 - 100.0 pg/mL Final   Lab Results  Component Value Date   CHOL 182 10/22/2014   HDL 49.80 10/22/2014   LDLCALC 110 (H) 10/22/2014   TRIG 107.0 10/22/2014   No results found for: DDIMER Lipase  Date/Time Value Ref Range Status  10/06/2015 03:39 PM 20 11 - 51 U/L Final    Echo: 03/2014  LV EF: 55% -  60%  ------------------------------------------------------------------- Indications:   Chest pain (R07.9).  ------------------------------------------------------------------- History:  PMH: Limited for chest pain.  ------------------------------------------------------------------- Study Conclusions  - Left ventricle: The cavity size was normal. There was mild concentric hypertrophy. Systolic function was normal. The estimated ejection fraction was in the range of 55% to 60%. Wall motion was normal; there were no regional wall motion abnormalities. - Aortic valve: Moderate focal thickening and calcification involving the right coronary cusp. There was moderate regurgitation. - Mitral valve: There was trivial regurgitation. - Tricuspid valve: There was mild regurgitation. - Pulmonic valve: There was trivial regurgitation.  ECG:  Sinus rhythm at rate of 70bpm, LVH.   Radiology:  Dg Chest 2 View  Result Date: 11/11/2015 CLINICAL DATA:  Left lateral chest pain, shortness of breath, congestion, productive cough, and nausea and vomiting for the past 10 days. Current smoker. History of previous MI and, aortic insufficiency. EXAM: CHEST  2 VIEW COMPARISON:  PA and lateral chest x-ray of November 10, 2015 FINDINGS: The lungs are mildly hyperinflated and clear. The heart and pulmonary vascularity are normal. The mediastinum is normal in width. There is no pleural effusion. The bony thorax exhibits no acute abnormality. IMPRESSION: Hyperinflation consistent with chronic bronchitic change in the patient's smoking history. No CHF, pneumonia, nor other acute cardiopulmonary abnormality. Electronically Signed   By: Onalee Hua  [place] M.D.   On: 11/11/2015 15:57   Dg Chest 2 View  Result Date: 11/10/2015 CLINICAL DATA:  Productive cough and chest pain for 2 days. Hypertension. EXAM: CHEST  2 VIEW COMPARISON:  10/06/2015 FINDINGS: The heart size and mediastinal contours are within normal limits. Both lungs are clear. The visualized skeletal structures are unremarkable. IMPRESSION: Negative.  No active cardiopulmonary disease. Electronically Signed   By: Myles Rosenthal M.D.   On: 11/10/2015 18:33    ASSESSMENT AND PLAN:     1. Angina pectoris (HCC) - Similar to prior cardiac pain. Continue IV heparin, ASA, Plavix, statin and BB. EKG without acute changes. Troponin negative. Will proceeds with cath. If normal cath, consider discontinuation of Plavix as he is more than 1 years out of stent placement.   The patient understands that risks include but are not limited to stroke (1 in 1000), death (1 in 1000), kidney failure [usually temporary] (1 in 500), bleeding (1 in 200), allergic reaction [possibly serious] (1 in 200), and agrees to proceed.   Differential include uncontrolled HTN and bronchitis/COPD.   2. Essential hypertension - Elevated. Will increase HCTZ to 25gm qd. Unable to titrate BB due to baseline rate of 50-60s. IF still elevated, consider adding amlodipine.   3. Tobacco abuse - He is willing to quit. He was recently placed on Wellbutrin during [facility] visit. Defer to primary. Encouraged to quit. Education given.   4. HLD - Pending lab. Continue statin  5. Marijuana use - Advise  cessation. He is under stress and using it. Get UDS.   6. Possible bronchitis/COPD - Mild scatted wheezing on exam to LLL. PRN albuterol. Further management per primary.   7. Coronary artery disease due to lipid rich plaque s/p DES to LAD 02/2014   Signed: Edda Orea, PA 11/12/2015, 8:19 AM Pager [phone]  Co-Sign MD

## 2015-11-12 NOTE — Interval H&P Note (Signed)
Cath Lab Visit (complete for each Cath Lab visit)  Clinical Evaluation Leading to the Procedure:   ACS: Yes.    Non-ACS:    Anginal Classification: CCS III  Anti-ischemic medical therapy: Minimal Therapy (1 class of medications)  Non-Invasive Test Results: No non-invasive testing performed  Prior CABG: No previous CABG      History and Physical Interval Note:  11/12/2015 2:46 PM  Darren [name]  has presented today for surgery, with the diagnosis of chest pain  The various methods of treatment have been discussed with the patient and family. After consideration of risks, benefits and other options for treatment, the patient has consented to  Procedure(s): Left Heart Cath and Coronary Angiography (N/A) as a surgical intervention .  The patient's history has been reviewed, patient examined, no change in status, stable for surgery.  I have reviewed the patient's chart and labs.  Questions were answered to the patient's satisfaction.     Darren [name], Darren [name]

## 2015-11-12 NOTE — Progress Notes (Signed)
Responded to spiritual  Care consult to support to patient who needed prayer and someone to talk too.  Patient is overwhelmed with personal relationship  matters and was seeking counsel and guidance. I explored with patient his needs and  provided prayer, empathetic listening, presence,emotional and spiritual support. Patient expressed that the visit and conversation was helpful to him. Chaplain available as needed.   11/12/15 1000  Clinical Encounter Type  Visited With Patient;Health care provider  Visit Type Initial;Spiritual support  Spiritual Encounters  Spiritual Needs Prayer;Emotional  Stress Factors  Patient Stress Factors Family relationships  Fae PippinWatlington, Deitra Craine, Chaplain, [facility], Pager [phone]

## 2015-11-12 NOTE — Progress Notes (Signed)
ANTICOAGULATION CONSULT NOTE - Follow Up Consult  Pharmacy Consult for Heparin Indication: chest pain/ACS  Allergies  Allergen Reactions  . Hydrocodone Hives    Patient Measurements: Height: 5\' 7"  (170.2 cm) Weight: 145 lb 8.1 oz (66 kg) IBW/kg (Calculated) : 66.1 Heparin Dosing Weight: 66 kg  Vital Signs: Temp: 98.1 F (36.7 C) (09/15 0422) Temp Source: Oral (09/15 0422) BP: 141/68 (09/15 1144) Pulse Rate: 61 (09/15 1144)  Labs:  Recent Labs  11/10/15 1840 11/11/15 1527 11/11/15 2021 11/12/15 0149 11/12/15 0942  HGB 15.6 13.8  --  13.3  --   HCT 46.0 41.1  --  40.3  --   PLT  --  250  --  238  --   LABPROT  --   --   --   --  13.7  INR  --   --   --   --  1.05  HEPARINUNFRC  --   --   --  0.48 0.34  CREATININE 1.20 1.01  --   --   --   TROPONINI  --   --  <0.03 <0.03 0.04*    Estimated Creatinine Clearance: 85.3 mL/min (by C-G formula based on SCr of 1.01 mg/dL).  Assessment:   Heparin drip begun last night for chest pain.   Initial heparin level therapeutic (0.48) on 800 units/hr. Subsequent level 0.34, remains low therapeutic.  For cardiac cath later today.  Goal of Therapy:  Heparin level 0.3-0.7 units/ml Monitor platelets by anticoagulation protocol: Yes   Plan:   Continue heparin drip at 800 units/hr.  Daily heparin level and CBC while on heparin.  Dennie FettersEgan, Dalton Molesworth Donovan, RPh Pager: [phone] 11/12/2015,11:49 AM

## 2015-11-12 NOTE — Progress Notes (Signed)
Nutrition Brief Note  Patient identified on the Malnutrition Screening Tool (MST) Report.    Per wt readings, pt has had a 6% weight loss in approximately 6 weeks >> not significant for time frame.  Wt Readings from Last 15 Encounters:  11/11/15 145 lb 8.1 oz (66 kg)  10/14/15 150 lb 12.8 oz (68.4 kg)  10/06/15 155 lb (70.3 kg)  10/22/14 164 lb 1.9 oz (74.4 kg)  06/01/14 154 lb (69.9 kg)  05/08/14 156 lb 3.2 oz (70.9 kg)  04/21/14 150 lb (68 kg)  04/16/14 151 lb 3.8 oz (68.6 kg)  03/26/14 145 lb 8 oz (66 kg)  03/25/14 154 lb 1.9 oz (69.9 kg)  03/05/14 155 lb 6.8 oz (70.5 kg)    Body mass index is 22.79 kg/m. Patient meets criteria for Normal based on current BMI.   Current diet order is NPO. Labs and medications reviewed.   No nutrition interventions warranted at this time. If nutrition issues arise, please consult RD.   Maureen ChattersKatie Leala Bryand, RD, LDN Pager #: [phone] After-Hours Pager #: [phone]

## 2015-11-12 NOTE — Progress Notes (Signed)
Cath showed widely patent mid LAD with very mild in stent restenosis with normal RCA and LCx and normal LVF.  LVEDP normal.  Noncardiac CP likely.  Will get 2D echo to rule out pericardial effusion.  Will check stat d-dimer to rule out PE.

## 2015-11-12 NOTE — Progress Notes (Signed)
PROGRESS NOTE  Darren AgeeCharles A Ewell  NWG:[id number] DOB: 12-Apr-1970 DOA: 11/11/2015 PCP: Milus HeightEDMON,NOELLE, PA-C Outpatient Specialists:  Subjective: Reporting slight substernal chest pain, associated with minimal SOB.  Brief Narrative:  Darren [name] is a 45 y.o. male with a past medical history significant for CAD s/p DES to LAD 02/2014, HTN, and smoking who presents with chest pain.  The patient was in his usual state of health until about a month ago when he started to have onset of transient shortness of breath while working. Over the last 2 weeks now, he has noticed hot flashes at night, spells of nausea and shortness of breathas well as an intermittent left-sided "squeezing" or "sharp" chest pain, moderate in intensity reminiscent of his NSTEMI in 2016.  The pain is worse with exertion or emotional stress (he is dealing with his daughter's mother, which is stressful).  The pain is not worse with food, position.  Now over the last week, he feels that he is tired and SOB with doing his work as a Administratorlandscaper, and even sometimes "can't walk 10 feet without stopping to rest".  Tonight, in the shower, he felt malaise and chest pain and dizzy and so he had his neighbor bring him to the ER.  Assessment & Plan:   Principal Problem:   Angina pectoris (HCC) Active Problems:   Essential hypertension   Tobacco abuse   Coronary artery disease due to lipid rich plaque   Accelerating angina: This is new.  The patient has HEART score of 6. Angina is suspected, accelerating, possible ACS, although patient does have history of same after his stent that turned out to be nothing.  Other potential causes of chest pain (PE, dissection, pancreatitis, pneumonia/effusion, pericarditis) are doubted.  We have been asked to admit the patient for observation and etiology consultation with Cardiology tomorrow.  -Presented with substernal chest pain, cardiac enzymes minimally elevated at 0.04. -History of CAD with PCI,  one aspirin. -Started on heparin drip since yesterday, seen by cardiology for cardiac catheterization.  HTN:  Improving to normal in ED. -Continue metoprolol and HCTZ  CAD:  -Continue statin, Zetia, aspirin -Continue Plavix for now -Consult to Cardiology  Mood:  -Continue Lamictal  Possible COPD:  Wheezing and CXR consistent.  Smoker.  No formal dx.  No inhalers on medication list. -Smoking cessation recommended -If still SOB at discharge, consider beta agonist    DVT prophylaxis:  Code Status: Full Code Family Communication:  Disposition Plan:  Diet: Diet NPO time specified  Consultants:   Cardiology  Procedures:   None  Antimicrobials:   None   Objective: Vitals:   11/11/15 2015 11/11/15 2052 11/12/15 0422 11/12/15 1144  BP: 132/66 (!) 148/67 (!) 150/66 (!) 141/68  Pulse: (!) 52 (!) 52 61 61  Resp: 15 16 20    Temp:  98.5 F (36.9 C) 98.1 F (36.7 C)   TempSrc:  Oral Oral   SpO2: 98% 100% 100%   Weight:  66 kg (145 lb 8.1 oz)    Height:  5\' 7"  (1.702 m)      Intake/Output Summary (Last 24 hours) at 11/12/15 1242 Last data filed at 11/11/15 2045  Gross per 24 hour  Intake              222 ml  Output                0 ml  Net              222 ml  Filed Weights   11/11/15 2052  Weight: 66 kg (145 lb 8.1 oz)    Examination: General exam: Appears calm and comfortable  Respiratory system: Clear to auscultation. Respiratory effort normal. Cardiovascular system: S1 & S2 heard, RRR. No JVD, murmurs, rubs, gallops or clicks. No pedal edema. Gastrointestinal system: Abdomen is nondistended, soft and nontender. No organomegaly or masses felt. Normal bowel sounds heard. Central nervous system: Alert and oriented. No focal neurological deficits. Extremities: Symmetric 5 x 5 power. Skin: No rashes, lesions or ulcers Psychiatry: Judgement and insight appear normal. Mood & affect appropriate.   Data Reviewed: I have personally reviewed following labs  and imaging studies  CBC:  Recent Labs Lab 11/10/15 1840 11/11/15 1527 11/12/15 0149  WBC  --  9.4 7.1  HGB 15.6 13.8 13.3  HCT 46.0 41.1 40.3  MCV  --  87.8 88.0  PLT  --  250 238   Basic Metabolic Panel:  Recent Labs Lab 11/10/15 1840 11/11/15 1527  NA 140 137  K 3.5 3.6  CL 101 106  CO2  --  25  GLUCOSE 95 115*  BUN 10 6  CREATININE 1.20 1.01  CALCIUM  --  8.3*   GFR: Estimated Creatinine Clearance: 85.3 mL/min (by C-G formula based on SCr of 1.01 mg/dL). Liver Function Tests: No results for input(s): AST, ALT, ALKPHOS, BILITOT, PROT, ALBUMIN in the last 168 hours. No results for input(s): LIPASE, AMYLASE in the last 168 hours. No results for input(s): AMMONIA in the last 168 hours. Coagulation Profile:  Recent Labs Lab 11/12/15 0942  INR 1.05   Cardiac Enzymes:  Recent Labs Lab 11/11/15 2021 11/12/15 0149 11/12/15 0942  TROPONINI <0.03 <0.03 0.04*   BNP (last 3 results) No results for input(s): PROBNP in the last 8760 hours. HbA1C: No results for input(s): HGBA1C in the last 72 hours. CBG: No results for input(s): GLUCAP in the last 168 hours. Lipid Profile:  Recent Labs  11/12/15 0942  CHOL 127  HDL 42  LDLCALC 74  TRIG 55  CHOLHDL 3.0   Thyroid Function Tests:  Recent Labs  11/12/15 0942  TSH 0.554   Anemia Panel: No results for input(s): VITAMINB12, FOLATE, FERRITIN, TIBC, IRON, RETICCTPCT in the last 72 hours. Urine analysis:    Component Value Date/Time   COLORURINE YELLOW 10/06/2015 1541   APPEARANCEUR CLEAR 10/06/2015 1541   LABSPEC 1.024 10/06/2015 1541   PHURINE 6.0 10/06/2015 1541   GLUCOSEU NEGATIVE 10/06/2015 1541   HGBUR NEGATIVE 10/06/2015 1541   BILIRUBINUR NEGATIVE 10/06/2015 1541   KETONESUR NEGATIVE 10/06/2015 1541   PROTEINUR NEGATIVE 10/06/2015 1541   UROBILINOGEN 1.0 02/05/2007 2107   NITRITE NEGATIVE 10/06/2015 1541   LEUKOCYTESUR NEGATIVE 10/06/2015 1541   Sepsis  Labs: @LABRCNTIP (procalcitonin:4,lacticidven:4)  )No results found for this or any previous visit (from the past 240 hour(s)).   Invalid input(s): PROCALCITONIN, LACTICACIDVEN   Radiology Studies: Dg Chest 2 View  Result Date: 11/11/2015 CLINICAL DATA:  Left lateral chest pain, shortness of breath, congestion, productive cough, and nausea and vomiting for the past 10 days. Current smoker. History of previous MI and, aortic insufficiency. EXAM: CHEST  2 VIEW COMPARISON:  PA and lateral chest x-ray of November 10, 2015 FINDINGS: The lungs are mildly hyperinflated and clear. The heart and pulmonary vascularity are normal. The mediastinum is normal in width. There is no pleural effusion. The bony thorax exhibits no acute abnormality. IMPRESSION: Hyperinflation consistent with chronic bronchitic change in the patient's smoking history. No CHF, pneumonia, nor  other acute cardiopulmonary abnormality. Electronically Signed   By: David  [place] M.D.   On: 11/11/2015 15:57   Dg Chest 2 View  Result Date: 11/10/2015 CLINICAL DATA:  Productive cough and chest pain for 2 days. Hypertension. EXAM: CHEST  2 VIEW COMPARISON:  10/06/2015 FINDINGS: The heart size and mediastinal contours are within normal limits. Both lungs are clear. The visualized skeletal structures are unremarkable. IMPRESSION: Negative.  No active cardiopulmonary disease. Electronically Signed   By: Myles Rosenthal M.D.   On: 11/10/2015 18:33        Scheduled Meds: . aspirin EC  81 mg Oral Daily  . atorvastatin  80 mg Oral q1800  . clopidogrel  75 mg Oral Daily  . ezetimibe  10 mg Oral Daily  . hydrochlorothiazide  25 mg Oral Daily  . lamoTRIgine  150 mg Oral Daily  . metoprolol  25 mg Oral BID   Continuous Infusions: . heparin 800 Units/hr (11/11/15 2157)     LOS: 0 days    Time spent: 35 minutes    Fahed Morten A, MD [facility] Pager [phone]  If 7PM-7AM, please contact  night-coverage [email or web address] Password [facility] 11/12/2015, 12:42 PM

## 2015-11-12 NOTE — Consult Note (Signed)
CARDIOLOGY CONSULT NOTE   Patient ID: Darren [name] MRN: [id number] DOB/AGE: 46-Aug-1972 46 y.o.  Admit date: 11/11/2015  Primary Physician   REDMON,NOELLE, PA-C Primary Cardiologist   Dr. Mayford Knife Reason for Consultation   Chest pain Requesting Physician  Dr. Arthor Captain  HPI: Darren [name] is a 45 y.o. male with a history of HTN and CAD s/p DES  to mLAD (02/2014), moderate aortic insufficiency, poly substance abuse including tobacco and marijuana who presented with CP.   He presetned to [facility] on 03/04/2014 with chest pain and NSTEMI. He was cathed and had ulcerated plaque in the mid LAD that was treated with PCI/DES x 1. Echo revealed LVEF 60-65%, severe concentric LVH, normal wall motion, diastolicdysfunction, normal LV filling pressure, moderate AI, trivial MR. He was discharged on aspirin and Brilinta. He had recurrent chest pain and his symptom was concerning for stent rethrombosis, he was admitted from the office for cardiac catheterization. His serial troponins were negative. He underwent a scheduled cardiac catheterization on 03/26/2014 which revealed EF 50-55%, without focal segmental wall motion abnormality, widely patent mid LAD stent without evidence of restenosis.  He stopped taking all of his medicaton Feb 2017 as he lost his job and insurance. He was compliant before. He was seen in [facility] and wellness Center and his current medication was restarted.   Having intermittent chest pain for the past few weeks. Initially it was exertional now occurs with rest. Associated with SOB, diaphoresis, nausea and radiation to L jaw. This has been getting worse and came to ER for further evaluation. This pain is similar to his prior cardiac pain when he has stent placement.    In ER, BP was elevated. EKG showed sinus rhythm with LVH strain pattern. CXR consistence with bronchitis changes/COPD. Ttroponin x 3 negative. Currently CP on IV heparin.   Also seen in ER 11/10/15 for chest pain and  treated with Albuterol inhaler and cough medications.   Past Medical History:  Diagnosis Date  . Anxiety   . Aortic insufficiency    moderate by echo 04/2014  . Coronary artery disease 03/24/2014   NSTEMI s/p PCI of mid LAD and repeat cath with patent stent  . Depression   . Hypertension   . Tobacco abuse      Past Surgical History:  Procedure Laterality Date  . CARDIAC CATHETERIZATION  03/04/2014   PCI of mid LAD  . LEFT HEART CATHETERIZATION WITH CORONARY ANGIOGRAM Bilateral 03/04/2014   Procedure: LEFT HEART CATHETERIZATION WITH CORONARY ANGIOGRAM;  Surgeon: Kathleene Hazel, MD;  Location: [facility] [place] CATH LAB;  Service: Cardiovascular;  Laterality: Bilateral;  . LEFT HEART CATHETERIZATION WITH CORONARY ANGIOGRAM N/A 03/26/2014   Procedure: LEFT HEART CATHETERIZATION WITH CORONARY ANGIOGRAM;  Surgeon: Lennette Bihari, MD;  Location: [facility] CATH LAB;  Service: Cardiovascular;  Laterality: N/A;    Allergies  Allergen Reactions  . Hydrocodone Hives    I have reviewed the patient's current medications . aspirin EC  81 mg Oral Daily  . atorvastatin  80 mg Oral q1800  . clopidogrel  75 mg Oral Daily  . ezetimibe  10 mg Oral Daily  . hydrochlorothiazide  12.5 mg Oral Daily  . lamoTRIgine  150 mg Oral Daily  . metoprolol  25 mg Oral BID   . heparin 800 Units/hr (11/11/15 2157)   acetaminophen, gi cocktail, nitroGLYCERIN, ondansetron (ZOFRAN) IV, traZODone  Prior to Admission medications   Medication Sig Start Date End Date Taking? Authorizing Provider  acetaminophen (TYLENOL) 500  MG tablet Take 1-2 tablets (500-1,000 mg total) by mouth every 6 (six) hours as needed for mild pain, moderate pain, fever or headache. 11/10/15  Yes Trixie DredgeEmily West, PA-C  aspirin EC 81 MG tablet Take 81 mg by mouth every morning.   Yes Historical Provider, MD  atorvastatin (LIPITOR) 80 MG tablet Take 1 tablet (80 mg total) by mouth daily at 6 PM. For high cholesterol 06/19/14  Yes Sanjuana KavaAgnes I Nwoko, NP  clopidogrel  (PLAVIX) 75 MG tablet Take 1 tablet (75 mg total) by mouth daily. 10/06/15  Yes Doug SouSam Jacubowitz, MD  ezetimibe (ZETIA) 10 MG tablet Take 1 tablet (10 mg total) by mouth daily. 10/25/15  Yes Jaclyn ShaggyEnobong Amao, MD  guaiFENesin-dextromethorphan (ROBITUSSIN DM) 100-10 MG/5ML syrup Take 5 mLs by mouth every 4 (four) hours as needed for cough (and congestion). 11/10/15  Yes Trixie DredgeEmily West, PA-C  hydrochlorothiazide (HYDRODIURIL) 25 MG tablet Take 0.5 tablets (12.5 mg total) by mouth daily. 10/06/15  Yes Doug SouSam Jacubowitz, MD  ibuprofen (ADVIL,MOTRIN) 200 MG tablet Take 400 mg by mouth 2 (two) times daily as needed for moderate pain.   Yes Historical Provider, MD  lamoTRIgine (LAMICTAL) 150 MG tablet Take 1 tablet (150 mg total) by mouth daily. 04/20/15  Yes Cleotis NipperSyed T Arfeen, MD  metoprolol (LOPRESSOR) 50 MG tablet Take 0.5 tablets (25 mg total) by mouth 2 (two) times daily. 10/06/15  Yes Doug SouSam Jacubowitz, MD  pantoprazole (PROTONIX) 20 MG tablet Take 2 tablets (40 mg total) by mouth daily. 10/06/15  Yes Doug SouSam Jacubowitz, MD  traZODone (DESYREL) 50 MG tablet Take 1 tablet (50 mg total) by mouth at bedtime as needed for sleep. 11/19/14  Yes Cleotis NipperSyed T Arfeen, MD  benzonatate (TESSALON) 100 MG capsule Take 1 capsule (100 mg total) by mouth 2 (two) times daily as needed for cough. 11/10/15   Trixie DredgeEmily West, PA-C  buPROPion (WELLBUTRIN SR) 150 MG 12 hr tablet Take 1 tablet (150 mg total) by mouth 2 (two) times daily. For smoking cessation 10/14/15   Jaclyn ShaggyEnobong Amao, MD  hydrOXYzine (ATARAX/VISTARIL) 25 MG tablet Take 1 tablet (25 mg total) by mouth 3 (three) times daily with meals as needed for anxiety. 04/20/15   Cleotis NipperSyed T Arfeen, MD     Social History   Social History  . Marital status: Legally Separated    Spouse name: N/A  . Number of children: N/A  . Years of education: N/A   Occupational History  . Not on file.   Social History Main Topics  . Smoking status: Current Every Day Smoker    Packs/day: 0.50    Years: 30.00    Types: Cigarettes    . Smokeless tobacco: Never Used  . Alcohol use 1.8 oz/week    3 Cans of beer per week  . Drug use:     Types: Marijuana     Comment: 11/11/2015 "a couple times/week"  . Sexual activity: Yes    Birth control/ protection: Condom   Other Topics Concern  . Not on file   Social History Narrative  . No narrative on file    Family Status  Relation Status  . Mother Deceased  . Father Alive  . Brother Alive  . Other    Family History  Problem Relation Age of Onset  . Hypertension Mother   . Hypertension Father   . Alcohol abuse Father   . Hypertension Other   . Diabetes Other   . Heart attack Other     ROS:  Full 14 point review  of systems complete and found to be negative unless listed above.  Physical Exam: Blood pressure (!) 150/66, pulse 61, temperature 98.1 F (36.7 C), temperature source Oral, resp. rate 20, height 5\' 7"  (1.702 m), weight 145 lb 8.1 oz (66 kg), SpO2 100 %.  General: Well developed, well nourished, male in no acute distress Head: Eyes PERRLA, No xanthomas. Normocephalic and atraumatic, oropharynx without edema or exudate.  Lungs: Resp regular and unlabored. Diffuse wheezing on LLL.  Heart: RRR no s3, s4, or murmurs..   Neck: No carotid bruits. No lymphadenopathy.  No JVD. Abdomen: Bowel sounds present, abdomen soft and non-tender without masses or hernias noted. Msk:  No spine or cva tenderness. No weakness, no joint deformities or effusions. Extremities: No clubbing, cyanosis or edema. DP/PT/Radials 2+ and equal bilaterally. Neuro: Alert and oriented X 3. No focal deficits noted. Psych:  Good affect, responds appropriately Skin: No rashes or lesions noted.  Labs:   Lab Results  Component Value Date   WBC 7.1 11/12/2015   HGB 13.3 11/12/2015   HCT 40.3 11/12/2015   MCV 88.0 11/12/2015   PLT 238 11/12/2015   No results for input(s): INR in the last 72 hours.  Recent Labs Lab 11/11/15 1527  NA 137  K 3.6  CL 106  CO2 25  BUN 6   CREATININE 1.01  CALCIUM 8.3*  GLUCOSE 115*   Magnesium  Date Value Ref Range Status  03/25/2014 2.0 1.5 - 2.5 mg/dL Final    Recent Labs  16/10/96 2021 11/12/15 0149  TROPONINI <0.03 <0.03    Recent Labs  11/10/15 2156 11/11/15 1612  TROPIPOC 0.03 0.01   Pro B Natriuretic peptide (BNP)  Date/Time Value Ref Range Status  04/21/2014 12:50 PM 11.0 0.0 - 100.0 pg/mL Final   Lab Results  Component Value Date   CHOL 182 10/22/2014   HDL 49.80 10/22/2014   LDLCALC 110 (H) 10/22/2014   TRIG 107.0 10/22/2014   No results found for: DDIMER Lipase  Date/Time Value Ref Range Status  10/06/2015 03:39 PM 20 11 - 51 U/L Final    Echo: 03/2014  LV EF: 55% -  60%  ------------------------------------------------------------------- Indications:   Chest pain (R07.9).  ------------------------------------------------------------------- History:  PMH: Limited for chest pain.  ------------------------------------------------------------------- Study Conclusions  - Left ventricle: The cavity size was normal. There was mild concentric hypertrophy. Systolic function was normal. The estimated ejection fraction was in the range of 55% to 60%. Wall motion was normal; there were no regional wall motion abnormalities. - Aortic valve: Moderate focal thickening and calcification involving the right coronary cusp. There was moderate regurgitation. - Mitral valve: There was trivial regurgitation. - Tricuspid valve: There was mild regurgitation. - Pulmonic valve: There was trivial regurgitation.  ECG:  Sinus rhythm at rate of 70bpm, LVH.   Radiology:  Dg Chest 2 View  Result Date: 11/11/2015 CLINICAL DATA:  Left lateral chest pain, shortness of breath, congestion, productive cough, and nausea and vomiting for the past 10 days. Current smoker. History of previous MI and, aortic insufficiency. EXAM: CHEST  2 VIEW COMPARISON:  PA and lateral chest x-ray of November 10, 2015 FINDINGS: The lungs are mildly hyperinflated and clear. The heart and pulmonary vascularity are normal. The mediastinum is normal in width. There is no pleural effusion. The bony thorax exhibits no acute abnormality. IMPRESSION: Hyperinflation consistent with chronic bronchitic change in the patient's smoking history. No CHF, pneumonia, nor other acute cardiopulmonary abnormality. Electronically Signed   By: Onalee Hua  [place] M.D.   On: 11/11/2015 15:57   Dg Chest 2 View  Result Date: 11/10/2015 CLINICAL DATA:  Productive cough and chest pain for 2 days. Hypertension. EXAM: CHEST  2 VIEW COMPARISON:  10/06/2015 FINDINGS: The heart size and mediastinal contours are within normal limits. Both lungs are clear. The visualized skeletal structures are unremarkable. IMPRESSION: Negative.  No active cardiopulmonary disease. Electronically Signed   By: Myles Rosenthal M.D.   On: 11/10/2015 18:33    ASSESSMENT AND PLAN:     1. Angina pectoris (HCC) - Similar to prior cardiac pain. Continue IV heparin, ASA, Plavix, statin and BB. EKG without acute changes. Troponin negative. Will proceeds with cath. If normal cath, consider discontinuation of Plavix as he is more than 1 years out of stent placement.   The patient understands that risks include but are not limited to stroke (1 in 1000), death (1 in 1000), kidney failure [usually temporary] (1 in 500), bleeding (1 in 200), allergic reaction [possibly serious] (1 in 200), and agrees to proceed.   Differential include uncontrolled HTN and bronchitis/COPD.   2. Essential hypertension - Elevated. Will increase HCTZ to 25gm qd. Unable to titrate BB due to baseline rate of 50-60s. IF still elevated, consider adding amlodipine.   3. Tobacco abuse - He is willing to quit. He was recently placed on Wellbutrin during [facility] visit. Defer to primary. Encouraged to quit. Education given.   4. HLD - Pending lab. Continue statin  5. Marijuana use - Advise  cessation. He is under stress and using it. Get UDS.   6. Possible bronchitis/COPD - Mild scatted wheezing on exam to LLL. PRN albuterol. Further management per primary.   7. Coronary artery disease due to lipid rich plaque s/p DES to LAD 02/2014   Signed: Linwood Gullikson, PA 11/12/2015, 8:19 AM Pager [phone]  Co-Sign MD

## 2015-11-13 ENCOUNTER — Observation Stay (HOSPITAL_BASED_OUTPATIENT_CLINIC_OR_DEPARTMENT_OTHER): Payer: Managed Care, Other (non HMO)

## 2015-11-13 DIAGNOSIS — I10 Essential (primary) hypertension: Secondary | ICD-10-CM | POA: Diagnosis not present

## 2015-11-13 DIAGNOSIS — R079 Chest pain, unspecified: Secondary | ICD-10-CM | POA: Diagnosis not present

## 2015-11-13 DIAGNOSIS — I209 Angina pectoris, unspecified: Secondary | ICD-10-CM | POA: Diagnosis not present

## 2015-11-13 DIAGNOSIS — Z720 Tobacco use: Secondary | ICD-10-CM | POA: Diagnosis not present

## 2015-11-13 DIAGNOSIS — I2510 Atherosclerotic heart disease of native coronary artery without angina pectoris: Secondary | ICD-10-CM | POA: Diagnosis not present

## 2015-11-13 LAB — ECHOCARDIOGRAM COMPLETE
Height: 67 in
WEIGHTICAEL: 2328.06 [oz_av]

## 2015-11-13 NOTE — Progress Notes (Signed)
*  PRELIMINARY RESULTS* Echocardiogram 2D Echocardiogram has been performed.  Doristine SectionKristy H Braylen Denunzio 11/13/2015, 8:54 AM

## 2015-11-13 NOTE — Progress Notes (Signed)
SUBJECTIVE: The patient is doing well today.  At this time, he denies chest pain,  or any new concerns. Continued SOB.  Cardiac cath yesterday without significant restenosis of LAD stent.  Marland Kitchen. aspirin EC  81 mg Oral Daily  . atorvastatin  80 mg Oral q1800  . clopidogrel  75 mg Oral Daily  . ezetimibe  10 mg Oral Daily  . hydrochlorothiazide  25 mg Oral Daily  . lamoTRIgine  150 mg Oral Daily  . metoprolol  25 mg Oral BID      OBJECTIVE: Physical Exam: Vitals:   11/12/15 2018 11/12/15 2141 11/12/15 2237 11/13/15 0608  BP: 131/61 (!) 117/50 121/66 135/70  Pulse: 61 (!) 59 61 64  Resp: 20  20 18   Temp: 98.1 F (36.7 C)   98.2 F (36.8 C)  TempSrc: Oral   Oral  SpO2: 100%  100% 100%  Weight:      Height:        Intake/Output Summary (Last 24 hours) at 11/13/15 1036 Last data filed at 11/12/15 1630  Gross per 24 hour  Intake              360 ml  Output                0 ml  Net              360 ml    Telemetry reveals sinus rhythm  GEN- The patient is well appearing, alert and oriented x 3 today.   Head- normocephalic, atraumatic Eyes-  Sclera clear, conjunctiva pink Ears- hearing intact Oropharynx- clear Neck- supple, no JVP Lymph- no cervical lymphadenopathy Lungs- Clear to ausculation bilaterally, normal work of breathing Heart- Regular rate and rhythm, no murmurs, rubs or gallops, PMI not laterally displaced GI- soft, NT, ND, + BS Extremities- no clubbing, cyanosis, or edema Skin- no rash or lesion Psych- euthymic mood, full affect Neuro- strength and sensation are intact  LABS: Basic Metabolic Panel:  Recent Labs  19/14/7809/13/17 1840 11/11/15 1527  NA 140 137  K 3.5 3.6  CL 101 106  CO2  --  25  GLUCOSE 95 115*  BUN 10 6  CREATININE 1.20 1.01  CALCIUM  --  8.3*   Liver Function Tests: No results for input(s): AST, ALT, ALKPHOS, BILITOT, PROT, ALBUMIN in the last 72 hours. No results for input(s): LIPASE, AMYLASE in the last 72 hours. CBC:  Recent  Labs  11/11/15 1527 11/12/15 0149  WBC 9.4 7.1  HGB 13.8 13.3  HCT 41.1 40.3  MCV 87.8 88.0  PLT 250 238   Cardiac Enzymes:  Recent Labs  11/11/15 2021 11/12/15 0149 11/12/15 0942  TROPONINI <0.03 <0.03 0.04*   BNP: Invalid input(s): POCBNP D-Dimer:  Recent Labs  11/12/15 1750  DDIMER 0.51*   Hemoglobin A1C: No results for input(s): HGBA1C in the last 72 hours. Fasting Lipid Panel:  Recent Labs  11/12/15 0942  CHOL 127  HDL 42  LDLCALC 74  TRIG 55  CHOLHDL 3.0   Thyroid Function Tests:  Recent Labs  11/12/15 0942  TSH 0.554   Anemia Panel: No results for input(s): VITAMINB12, FOLATE, FERRITIN, TIBC, IRON, RETICCTPCT in the last 72 hours.  RADIOLOGY: Dg Chest 2 View  Result Date: 11/11/2015 CLINICAL DATA:  Left lateral chest pain, shortness of breath, congestion, productive cough, and nausea and vomiting for the past 10 days. Current smoker. History of previous MI and, aortic insufficiency. EXAM: CHEST  2 VIEW COMPARISON:  PA  and lateral chest x-ray of November 10, 2015 FINDINGS: The lungs are mildly hyperinflated and clear. The heart and pulmonary vascularity are normal. The mediastinum is normal in width. There is no pleural effusion. The bony thorax exhibits no acute abnormality. IMPRESSION: Hyperinflation consistent with chronic bronchitic change in the patient's smoking history. No CHF, pneumonia, nor other acute cardiopulmonary abnormality. Electronically Signed   By: David  [place] M.D.   On: 11/11/2015 15:57   Dg Chest 2 View  Result Date: 11/10/2015 CLINICAL DATA:  Productive cough and chest pain for 2 days. Hypertension. EXAM: CHEST  2 VIEW COMPARISON:  10/06/2015 FINDINGS: The heart size and mediastinal contours are within normal limits. Both lungs are clear. The visualized skeletal structures are unremarkable. IMPRESSION: Negative.  No active cardiopulmonary disease. Electronically Signed   By: Myles Rosenthal M.D.   On: 11/10/2015 18:33     ASSESSMENT AND PLAN:  Principal Problem:   Angina pectoris (HCC) Active Problems:   Essential hypertension   Tobacco abuse   Coronary artery disease due to lipid rich plaque Cardiac cath yesterday without evidence for worsening CAD with LAD stent patient.  TTE today shows normal EF without changes.  No further cardiac workup necessary at this time.  Plan for cardiology sign off.  Please call back if further questions.  Toure Edmonds Jorja Loa, MD 11/13/2015 10:36 AM

## 2015-11-13 NOTE — Discharge Summary (Signed)
Physician Discharge Summary  Darren AgeeCharles A Dierks ZOX:[id number] DOB: 02/27/1971 DOA: 11/11/2015  PCP: REDMON,NOELLE, PA-C  Admit date: 11/11/2015 Discharge date: 11/13/2015  Admitted From: Home Disposition: Home  Recommendations for Outpatient Follow-up:  1. Follow up with PCP in 1-2 weeks 2. Please obtain BMP/CBC in one week  Home Health:NA Equipment/Devices:NA Discharge Condition: Stable CODE STATUS: Full Diet recommendation: Heart Healthy  Brief/Interim Summary: Darren AgeeCharles A [name] is a 45 y.o. male with a past medical history significant for CAD s/p DES to LAD 02/2014, HTN, and smoking who presents with chest pain. The patient was in his usual state of health until about a month ago when he started to have onset of transient shortness of breath while working. Over the last 2 weeks now, he has noticed hot flashes at night, spells of nausea and shortness of breathas well as an intermittent left-sided "squeezing" or "sharp" chest pain, moderate in intensity reminiscent of his NSTEMI in 2016.  The pain is worse with exertion or emotional stress (he is dealing with his daughter's mother, which is stressful).  The pain is not worse with food, position.  Now over the last week, he feels that he is tired and SOB with doing his work as a Administratorlandscaper, and even sometimes "can't walk 10 feet without stopping to rest".  Tonight, in the shower, he felt malaise and chest pain and dizzy and so he had his neighbor bring him to the ER.  Discharge Diagnoses:  Principal Problem:   Angina pectoris (HCC) Active Problems:   Essential hypertension   Tobacco abuse   Coronary artery disease due to lipid rich plaque   Chest pain -Presented with left-sided chest pain with transient shortness of breath. -HEART score of 6, history of CAD and stent. Initially started on heparin drip. -Seen by cardiology because of history of CAD cardiac catheterization was done which showed patent coronaries. -Unlikely to be PE,  d-dimer slightly positive at 0.51 but without tachycardia or hypoxia. -Patient is on aspirin, beta blockers and statins which is continued throughout the hospital stay. -Prior to discharge 2-D echo was done and showed normal ejection fraction without wall motion abnormalities. -Pain is likely musculoskeletal in nature, this is reproducible with palpation.  HTN: Improving to normal in ED. -Continue metoprolol and HCTZ  CAD: -Continue statin, Zetia, aspirin -Continue Plavix for now -Consult to Cardiology  Mood: -Continue Lamictal  Possible COPD: Wheezing and CXR consistent. Smoker. No formal dx. No inhalers on medication list. -Counseled about cessation, patient reported that "he is done with cigarettes"  Discharge Instructions  Discharge Instructions    Diet - low sodium heart healthy    Complete by:  As directed    Increase activity slowly    Complete by:  As directed        Medication List    TAKE these medications   acetaminophen 500 MG tablet Commonly known as:  TYLENOL Take 1-2 tablets (500-1,000 mg total) by mouth every 6 (six) hours as needed for mild pain, moderate pain, fever or headache.   aspirin EC 81 MG tablet Take 81 mg by mouth every morning.   atorvastatin 80 MG tablet Commonly known as:  LIPITOR Take 1 tablet (80 mg total) by mouth daily at 6 PM. For high cholesterol   benzonatate 100 MG capsule Commonly known as:  TESSALON Take 1 capsule (100 mg total) by mouth 2 (two) times daily as needed for cough.   buPROPion 150 MG 12 hr tablet Commonly known as:  WELLBUTRIN SR  Take 1 tablet (150 mg total) by mouth 2 (two) times daily. For smoking cessation   clopidogrel 75 MG tablet Commonly known as:  PLAVIX Take 1 tablet (75 mg total) by mouth daily.   ezetimibe 10 MG tablet Commonly known as:  ZETIA Take 1 tablet (10 mg total) by mouth daily.   guaiFENesin-dextromethorphan 100-10 MG/5ML syrup Commonly known as:  ROBITUSSIN DM Take 5  mLs by mouth every 4 (four) hours as needed for cough (and congestion).   hydrochlorothiazide 25 MG tablet Commonly known as:  HYDRODIURIL Take 0.5 tablets (12.5 mg total) by mouth daily.   hydrOXYzine 25 MG tablet Commonly known as:  ATARAX/VISTARIL Take 1 tablet (25 mg total) by mouth 3 (three) times daily with meals as needed for anxiety.   ibuprofen 200 MG tablet Commonly known as:  ADVIL,MOTRIN Take 400 mg by mouth 2 (two) times daily as needed for moderate pain.   lamoTRIgine 150 MG tablet Commonly known as:  LAMICTAL Take 1 tablet (150 mg total) by mouth daily.   metoprolol 50 MG tablet Commonly known as:  LOPRESSOR Take 0.5 tablets (25 mg total) by mouth 2 (two) times daily.   pantoprazole 20 MG tablet Commonly known as:  PROTONIX Take 2 tablets (40 mg total) by mouth daily.   traZODone 50 MG tablet Commonly known as:  DESYREL Take 1 tablet (50 mg total) by mouth at bedtime as needed for sleep.       Allergies  Allergen Reactions  . Hydrocodone Hives    Consultations:  [facility]   Procedures/Studies: Dg Chest 2 View  Result Date: 11/11/2015 CLINICAL DATA:  Left lateral chest pain, shortness of breath, congestion, productive cough, and nausea and vomiting for the past 10 days. Current smoker. History of previous MI and, aortic insufficiency. EXAM: CHEST  2 VIEW COMPARISON:  PA and lateral chest x-ray of November 10, 2015 FINDINGS: The lungs are mildly hyperinflated and clear. The heart and pulmonary vascularity are normal. The mediastinum is normal in width. There is no pleural effusion. The bony thorax exhibits no acute abnormality. IMPRESSION: Hyperinflation consistent with chronic bronchitic change in the patient's smoking history. No CHF, pneumonia, nor other acute cardiopulmonary abnormality. Electronically Signed   By: David  [place] M.D.   On: 11/11/2015 15:57   Dg Chest 2 View  Result Date: 11/10/2015 CLINICAL DATA:  Productive cough and chest  pain for 2 days. Hypertension. EXAM: CHEST  2 VIEW COMPARISON:  10/06/2015 FINDINGS: The heart size and mediastinal contours are within normal limits. Both lungs are clear. The visualized skeletal structures are unremarkable. IMPRESSION: Negative.  No active cardiopulmonary disease. Electronically Signed   By: Myles Rosenthal M.D.   On: 11/10/2015 18:33    (Echo, Carotid, EGD, Colonoscopy, ERCP)    Subjective:   Discharge Exam: Vitals:   11/12/15 2237 11/13/15 0608  BP: 121/66 135/70  Pulse: 61 64  Resp: 20 18  Temp:  98.2 F (36.8 C)   Vitals:   11/12/15 2018 11/12/15 2141 11/12/15 2237 11/13/15 0608  BP: 131/61 (!) 117/50 121/66 135/70  Pulse: 61 (!) 59 61 64  Resp: 20  20 18   Temp: 98.1 F (36.7 C)   98.2 F (36.8 C)  TempSrc: Oral   Oral  SpO2: 100%  100% 100%  Weight:      Height:        General: Pt is alert, awake, not in acute distress Cardiovascular: RRR, S1/S2 +, no rubs, no gallops Respiratory: CTA bilaterally, no wheezing,  no rhonchi Abdominal: Soft, NT, ND, bowel sounds + Extremities: no edema, no cyanosis    The results of significant diagnostics from this hospitalization (including imaging, microbiology, ancillary and laboratory) are listed below for reference.     Microbiology: No results found for this or any previous visit (from the past 240 hour(s)).   Labs: BNP (last 3 results) No results for input(s): BNP in the last 8760 hours. Basic Metabolic Panel:  Recent Labs Lab 11/10/15 1840 11/11/15 1527  NA 140 137  K 3.5 3.6  CL 101 106  CO2  --  25  GLUCOSE 95 115*  BUN 10 6  CREATININE 1.20 1.01  CALCIUM  --  8.3*   Liver Function Tests: No results for input(s): AST, ALT, ALKPHOS, BILITOT, PROT, ALBUMIN in the last 168 hours. No results for input(s): LIPASE, AMYLASE in the last 168 hours. No results for input(s): AMMONIA in the last 168 hours. CBC:  Recent Labs Lab 11/10/15 1840 11/11/15 1527 11/12/15 0149  WBC  --  9.4 7.1  HGB  15.6 13.8 13.3  HCT 46.0 41.1 40.3  MCV  --  87.8 88.0  PLT  --  250 238   Cardiac Enzymes:  Recent Labs Lab 11/11/15 2021 11/12/15 0149 11/12/15 0942  TROPONINI <0.03 <0.03 0.04*   BNP: Invalid input(s): POCBNP CBG: No results for input(s): GLUCAP in the last 168 hours. D-Dimer  Recent Labs  11/12/15 1750  DDIMER 0.51*   Hgb A1c No results for input(s): HGBA1C in the last 72 hours. Lipid Profile  Recent Labs  11/12/15 0942  CHOL 127  HDL 42  LDLCALC 74  TRIG 55  CHOLHDL 3.0   Thyroid function studies  Recent Labs  11/12/15 0942  TSH 0.554   Anemia work up No results for input(s): VITAMINB12, FOLATE, FERRITIN, TIBC, IRON, RETICCTPCT in the last 72 hours. Urinalysis    Component Value Date/Time   COLORURINE YELLOW 10/06/2015 1541   APPEARANCEUR CLEAR 10/06/2015 1541   LABSPEC 1.024 10/06/2015 1541   PHURINE 6.0 10/06/2015 1541   GLUCOSEU NEGATIVE 10/06/2015 1541   HGBUR NEGATIVE 10/06/2015 1541   BILIRUBINUR NEGATIVE 10/06/2015 1541   KETONESUR NEGATIVE 10/06/2015 1541   PROTEINUR NEGATIVE 10/06/2015 1541   UROBILINOGEN 1.0 02/05/2007 2107   NITRITE NEGATIVE 10/06/2015 1541   LEUKOCYTESUR NEGATIVE 10/06/2015 1541   Sepsis Labs Invalid input(s): PROCALCITONIN,  WBC,  LACTICIDVEN Microbiology No results found for this or any previous visit (from the past 240 hour(s)).   Time coordinating discharge: Over 30 minutes  SIGNED:   Clint Lipps, MD  [facility] 11/13/2015, 10:47 AM Pager   If 7PM-7AM, please contact night-coverage [email or web address] Password [email or web address]

## 2015-11-13 NOTE — Progress Notes (Signed)
Discharge instructions (including medications) discussed with and copy provided to patient/caregiver. Signature sheet signed and copy placed in chart.

## 2015-11-15 ENCOUNTER — Encounter (HOSPITAL_COMMUNITY): Payer: Self-pay | Admitting: Cardiovascular Disease

## 2016-02-03 ENCOUNTER — Ambulatory Visit (HOSPITAL_COMMUNITY): Payer: Self-pay | Admitting: Psychiatry

## 2016-03-24 ENCOUNTER — Ambulatory Visit (INDEPENDENT_AMBULATORY_CARE_PROVIDER_SITE_OTHER): Payer: Managed Care, Other (non HMO) | Admitting: Family Medicine

## 2016-03-24 VITALS — BP 148/78 | HR 64 | Temp 98.3°F | Resp 18 | Ht 67.0 in | Wt 160.0 lb

## 2016-03-24 DIAGNOSIS — F32A Depression, unspecified: Secondary | ICD-10-CM

## 2016-03-24 DIAGNOSIS — I209 Angina pectoris, unspecified: Secondary | ICD-10-CM

## 2016-03-24 DIAGNOSIS — J439 Emphysema, unspecified: Secondary | ICD-10-CM

## 2016-03-24 DIAGNOSIS — Z720 Tobacco use: Secondary | ICD-10-CM

## 2016-03-24 DIAGNOSIS — I2583 Coronary atherosclerosis due to lipid rich plaque: Secondary | ICD-10-CM

## 2016-03-24 DIAGNOSIS — F329 Major depressive disorder, single episode, unspecified: Secondary | ICD-10-CM

## 2016-03-24 DIAGNOSIS — F418 Other specified anxiety disorders: Secondary | ICD-10-CM

## 2016-03-24 DIAGNOSIS — F419 Anxiety disorder, unspecified: Secondary | ICD-10-CM

## 2016-03-24 DIAGNOSIS — I2510 Atherosclerotic heart disease of native coronary artery without angina pectoris: Secondary | ICD-10-CM | POA: Diagnosis not present

## 2016-03-24 DIAGNOSIS — I10 Essential (primary) hypertension: Secondary | ICD-10-CM

## 2016-03-24 HISTORY — DX: Emphysema, unspecified: J43.9

## 2016-03-24 MED ORDER — BUPROPION HCL ER (SR) 150 MG PO TB12: 150.0000 mg | ORAL_TABLET | Freq: Two times a day (BID) | ORAL | 3 refills | Status: DC

## 2016-03-24 MED ORDER — NITROGLYCERIN 0.4 MG SL SUBL: 0.4000 mg | SUBLINGUAL_TABLET | SUBLINGUAL | 0 refills | Status: DC | PRN

## 2016-03-24 NOTE — Patient Instructions (Addendum)
   IF you received an x-ray today, you will receive an invoice from [facility]. Please contact [facility] at [phone] with questions or concerns regarding your invoice.   IF you received labwork today, you will receive an invoice from [facility]. Please contact [facility] at [phone] with questions or concerns regarding your invoice.   Our billing staff will not be able to assist you with questions regarding bills from these companies.  You will be contacted with the lab results as soon as they are available. The fastest way to get your results is to activate your My Chart account. Instructions are located on the last page of this paperwork. If you have not heard from us regarding the results in 2 weeks, please contact this office.     Steps to Quit Smoking Smoking tobacco can be harmful to your health and can affect almost every organ in your body. Smoking puts you, and those around you, at risk for developing many serious chronic diseases. Quitting smoking is difficult, but it is one of the best things that you can do for your health. It is never too late to quit. What are the benefits of quitting smoking? When you quit smoking, you lower your risk of developing serious diseases and conditions, such as:  Lung cancer or lung disease, such as COPD.  Heart disease.  Stroke.  Heart attack.  Infertility.  Osteoporosis and bone fractures.  Additionally, symptoms such as coughing, wheezing, and shortness of breath may get better when you quit. You may also find that you get sick less often because your body is stronger at fighting off colds and infections. If you are pregnant, quitting smoking can help to reduce your chances of having a baby of low birth weight. How do I get ready to quit? When you decide to quit smoking, create a plan to make sure that you are successful. Before you quit:  Pick a date to quit. Set a date within the next two weeks to give you  time to prepare.  Write down the reasons why you are quitting. Keep this list in places where you will see it often, such as on your bathroom mirror or in your car or wallet.  Identify the people, places, things, and activities that make you want to smoke (triggers) and avoid them. Make sure to take these actions: ? Throw away all cigarettes at home, at work, and in your car. ? Throw away smoking accessories, such as ashtrays and lighters. ? Clean your car and make sure to empty the ashtray. ? Clean your home, including curtains and carpets.  Tell your family, friends, and coworkers that you are quitting. Support from your loved ones can make quitting easier.  Talk with your health care provider about your options for quitting smoking.  Find out what treatment options are covered by your health insurance.  What strategies can I use to quit smoking? Talk with your healthcare provider about different strategies to quit smoking. Some strategies include:  Quitting smoking altogether instead of gradually lessening how much you smoke over a period of time. Research shows that quitting "cold turkey" is more successful than gradually quitting.  Attending in-person counseling to help you build problem-solving skills. You are more likely to have success in quitting if you attend several counseling sessions. Even short sessions of 10 minutes can be effective.  Finding resources and support systems that can help you to quit smoking and remain smoke-free after you quit. These resources are   most helpful when you use them often. They can include: ? Online chats with a counselor. ? Telephone quitlines. ? Printed self-help materials. ? Support groups or group counseling. ? Text messaging programs. ? Mobile phone applications.  Taking medicines to help you quit smoking. (If you are pregnant or breastfeeding, talk with your health care provider first.) Some medicines contain nicotine and some do not.  Both types of medicines help with cravings, but the medicines that include nicotine help to relieve withdrawal symptoms. Your health care provider may recommend: ? Nicotine patches, gum, or lozenges. ? Nicotine inhalers or sprays. ? Non-nicotine medicine that is taken by mouth.  Talk with your health care provider about combining strategies, such as taking medicines while you are also receiving in-person counseling. Using these two strategies together makes you more likely to succeed in quitting than if you used either strategy on its own. If you are pregnant or breastfeeding, talk with your health care provider about finding counseling or other support strategies to quit smoking. Do not take medicine to help you quit smoking unless told to do so by your health care provider. What things can I do to make it easier to quit? Quitting smoking might feel overwhelming at first, but there is a lot that you can do to make it easier. Take these important actions:  Reach out to your family and friends and ask that they support and encourage you during this time. Call telephone quitlines, reach out to support groups, or work with a counselor for support.  Ask people who smoke to avoid smoking around you.  Avoid places that trigger you to smoke, such as bars, parties, or smoke-break areas at work.  Spend time around people who do not smoke.  Lessen stress in your life, because stress can be a smoking trigger for some people. To lessen stress, try: ? Exercising regularly. ? Deep-breathing exercises. ? Yoga. ? Meditating. ? Performing a body scan. This involves closing your eyes, scanning your body from head to toe, and noticing which parts of your body are particularly tense. Purposefully relax the muscles in those areas.  Download or purchase mobile phone or tablet apps (applications) that can help you stick to your quit plan by providing reminders, tips, and encouragement. There are many free apps,  such as QuitGuide from the CDC (Centers for Disease Control and Prevention). You can find other support for quitting smoking (smoking cessation) through smokefree.gov and other websites.  How will I feel when I quit smoking? Within the first 24 hours of quitting smoking, you may start to feel some withdrawal symptoms. These symptoms are usually most noticeable 2-3 days after quitting, but they usually do not last beyond 2-3 weeks. Changes or symptoms that you might experience include:  Mood swings.  Restlessness, anxiety, or irritation.  Difficulty concentrating.  Dizziness.  Strong cravings for sugary foods in addition to nicotine.  Mild weight gain.  Constipation.  Nausea.  Coughing or a sore throat.  Changes in how your medicines work in your body.  A depressed mood.  Difficulty sleeping (insomnia).  After the first 2-3 weeks of quitting, you may start to notice more positive results, such as:  Improved sense of smell and taste.  Decreased coughing and sore throat.  Slower heart rate.  Lower blood pressure.  Clearer skin.  The ability to breathe more easily.  Fewer sick days.  Quitting smoking is very challenging for most people. Do not get discouraged if you are not successful the   first time. Some people need to make many attempts to quit before they achieve long-term success. Do your best to stick to your quit plan, and talk with your health care provider if you have any questions or concerns. This information is not intended to replace advice given to you by your health care provider. Make sure you discuss any questions you have with your health care provider. Document Released: 02/07/2001 Document Revised: 10/12/2015 Document Reviewed: 06/30/2014 Elsevier Interactive Patient Education  2017 [employer]  

## 2016-03-24 NOTE — Progress Notes (Signed)
Chief Complaint  Patient presents with  . Follow-up    medication     HPI   NSTEMI with stent since 2016 He reports that he nitroglycerin as needed but has been out of it for a year now.  He denies any current chest pains He reports that his chest pains are aggravated by high stress or strenuous work.  He thinks Darren [name] of his chest pains feels like anxiety.  Smokes 1-2 black and mild cigars a day He was smoking cigarettes from age 46 to December 2017 (28 pack year) He was prescribed Wellbutrin but has not taken it due to concerns about the cost.  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   Anxiety and Depression Reports that he takes Lamictal and Trazodone He reports that he has been having fluctuating moods but denies suicidal ideation He sees Psychiatry for his anxiety and depression He reports that his mood has gotten worse since his heart attack and with financial stress.  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     Past Medical History:  Diagnosis Date  . Anxiety   . Aortic insufficiency    moderate by echo 04/2014  . Coronary artery disease 03/24/2014   NSTEMI s/p PCI of mid LAD and repeat cath with patent stent  . Depression   . Hypertension   . Tobacco abuse     Current Outpatient Prescriptions  Medication Sig Dispense Refill  . mometasone-formoterol (DULERA) 100-5 MCG/ACT AERO Inhale 2 puffs into the lungs 2 (two) times daily.    Marland Kitchen acetaminophen (TYLENOL) 500 MG tablet Take 1-2 tablets (500-1,000 mg total) by mouth every 6 (six) hours as needed for mild pain, moderate pain, fever or headache. 30 tablet 0  . aspirin EC 81 MG tablet Take 81 mg by mouth every morning.    Marland Kitchen atorvastatin (LIPITOR) 80 MG tablet Take 1 tablet (80 mg total) by mouth daily at 6 PM. For high cholesterol 30  tablet 5  . benzonatate (TESSALON) 100 MG capsule Take 1 capsule (100 mg total) by mouth 2 (two) times daily as needed for cough. 20 capsule 0  . buPROPion (WELLBUTRIN SR) 150 MG 12 hr tablet Take 1 tablet (150 mg total) by mouth 2 (two) times daily. 60 tablet 3  . clopidogrel (PLAVIX) 75 MG tablet Take 1 tablet (75 mg total) by mouth daily. 30 tablet 0  . ezetimibe (ZETIA) 10 MG tablet Take 1 tablet (10 mg total) by mouth daily. 90 tablet 3  . guaiFENesin-dextromethorphan (ROBITUSSIN DM) 100-10 MG/5ML syrup Take 5 mLs by mouth every 4 (four) hours as needed for cough (and congestion). 118 mL 0  . hydrochlorothiazide (HYDRODIURIL) 25 MG tablet Take 0.5 tablets (12.5 mg total) by mouth daily. 15 tablet 0  . hydrOXYzine (ATARAX/VISTARIL) 25 MG tablet Take 1 tablet (25 mg total) by mouth 3 (three) times daily with meals as needed for anxiety. 90 tablet 1  . ibuprofen (ADVIL,MOTRIN) 200 MG tablet Take 400 mg by mouth 2 (two) times daily as needed for moderate pain.    Marland Kitchen lamoTRIgine (LAMICTAL) 150 MG tablet Take 1 tablet (150 mg total) by mouth daily. 30 tablet 1  . metoprolol (LOPRESSOR) 50 MG tablet Take 0.5 tablets (25 mg total) by mouth 2 (two) times daily. 30 tablet 0  .  nitroGLYCERIN (NITROSTAT) 0.4 MG SL tablet Place 1 tablet (0.4 mg total) under the tongue every 5 (five) minutes as needed for chest pain. 30 tablet 0  . pantoprazole (PROTONIX) 20 MG tablet Take 2 tablets (40 mg total) by mouth daily. 60 tablet 0  . pantoprazole (PROTONIX) 40 MG tablet     . traZODone (DESYREL) 50 MG tablet Take 1 tablet (50 mg total) by mouth at bedtime as needed for sleep. 30 tablet 0   No current facility-administered medications for this visit.     Allergies:  Allergies  Allergen Reactions  . Hydrocodone Hives    Past Surgical History:  Procedure Laterality Date  . CARDIAC CATHETERIZATION  03/04/2014   PCI of mid LAD  . CARDIAC CATHETERIZATION N/A 11/12/2015   Procedure: Left Heart Cath and Coronary  Angiography;  Surgeon: Tonny Bollman, MD;  Location: [facility] INVASIVE CV LAB;  Service: Cardiovascular;  Laterality: N/A;  . LEFT HEART CATHETERIZATION WITH CORONARY ANGIOGRAM Bilateral 03/04/2014   Procedure: LEFT HEART CATHETERIZATION WITH CORONARY ANGIOGRAM;  Surgeon: Kathleene Hazel, MD;  Location: [facility] CATH LAB;  Service: Cardiovascular;  Laterality: Bilateral;  . LEFT HEART CATHETERIZATION WITH CORONARY ANGIOGRAM N/A 03/26/2014   Procedure: LEFT HEART CATHETERIZATION WITH CORONARY ANGIOGRAM;  Surgeon: Lennette Bihari, MD;  Location: [facility] CATH LAB;  Service: Cardiovascular;  Laterality: N/A;    Social History   Social History  . Marital status: Legally Separated    Spouse name: N/A  . Number of children: N/A  . Years of education: N/A   Social History Main Topics  . Smoking status: Current Every Day Smoker    Packs/day: 0.50    Years: 30.00    Types: Cigarettes  . Smokeless tobacco: Never Used  . Alcohol use 1.8 oz/week    3 Cans of beer per week  . Drug use: Yes    Types: Marijuana     Comment: 11/11/2015 "a couple times/week"  . Sexual activity: Yes    Birth control/ protection: Condom   Other Topics Concern  . None   Social History Narrative  . None    Review of Systems  Constitutional: Negative for chills, fever and weight loss.  Eyes: Negative for blurred vision, double vision and photophobia.  Respiratory: Positive for shortness of breath.   Cardiovascular: Negative for chest pain, palpitations and leg swelling.  Gastrointestinal: Negative for abdominal pain, diarrhea and nausea.  Genitourinary: Negative for dysuria, frequency and urgency.  Musculoskeletal: Negative for back pain, myalgias and neck pain.  Skin: Negative for itching and rash.  Neurological: Negative for dizziness, tingling and headaches.  Psychiatric/Behavioral: Positive for suicidal ideas. Negative for hallucinations and substance abuse. The patient is nervous/anxious.     Objective: Vitals:    03/24/16 1556  BP: (!) 148/78  Pulse: 64  Resp: 18  Temp: 98.3 F (36.8 C)  TempSrc: Oral  SpO2: 99%  Weight: 160 lb (72.6 kg)  Height: 5\' 7"  (1.702 m)   BP Readings from Last 3 Encounters:  03/24/16 (!) 148/78  11/13/15 135/70  11/10/15 161/80    Physical Exam  Constitutional: He is oriented to person, place, and time. He appears well-developed and well-nourished.  HENT:  Head: Normocephalic and atraumatic.  Right Ear: External ear normal.  Left Ear: External ear normal.  Nose: Nose normal.  Mouth/Throat: Oropharynx is clear and moist. No oropharyngeal exudate.  Eyes: Conjunctivae and EOM are normal.  Cardiovascular: Normal rate and regular rhythm.   Murmur heard. Pulmonary/Chest: Effort normal and breath  sounds normal. No respiratory distress. He has no wheezes.  Abdominal: Soft. Bowel sounds are normal. He exhibits no distension. No hernia.  Musculoskeletal: Normal range of motion.  Neurological: He is alert and oriented to person, place, and time.  Skin: Skin is warm. Capillary refill takes less than 2 seconds.  Psychiatric: He has a normal mood and affect. His behavior is normal. Judgment and thought content normal.    Assessment and Plan Darren MostCharles was seen today for follow-up.  Diagnoses and all orders for this visit:  Essential hypertension- cpm set forth by Cardiology  Tobacco abuse- discussed taking buproprion for anxiety and smoking cessation -     buPROPion (WELLBUTRIN SR) 150 MG 12 hr tablet; Take 1 tablet (150 mg total) by mouth 2 (two) times daily.  Coronary artery disease due to lipid rich plaque- discussed lipid mgmt  Angina pectoris (HCC)- refilled Nitroglycern, currently asymptomatic  Anxiety and depression- follow up with Psychology  Pulmonary emphysema, unspecified emphysema type (HCC)- continue dulera Emphasized smoking cessation Pt is agreeable  Other orders -     nitroGLYCERIN (NITROSTAT) 0.4 MG SL tablet; Place 1 tablet (0.4 mg total)  under the tongue every 5 (five) minutes as needed for chest pain.    A total of 47 minutes were spent face-to-face with the patient during this encounter and over half of that time was spent on counseling and coordination of care.  Saturnino Liew A Ling Flesch

## 2016-04-07 ENCOUNTER — Ambulatory Visit (INDEPENDENT_AMBULATORY_CARE_PROVIDER_SITE_OTHER): Payer: Managed Care, Other (non HMO) | Admitting: Family Medicine

## 2016-04-07 VITALS — BP 124/80 | HR 77 | Temp 97.8°F | Resp 17 | Ht 67.0 in | Wt 160.0 lb

## 2016-04-07 DIAGNOSIS — F32A Depression, unspecified: Secondary | ICD-10-CM

## 2016-04-07 DIAGNOSIS — F418 Other specified anxiety disorders: Secondary | ICD-10-CM

## 2016-04-07 DIAGNOSIS — F331 Major depressive disorder, recurrent, moderate: Secondary | ICD-10-CM | POA: Diagnosis not present

## 2016-04-07 DIAGNOSIS — Z760 Encounter for issue of repeat prescription: Secondary | ICD-10-CM | POA: Diagnosis not present

## 2016-04-07 DIAGNOSIS — J439 Emphysema, unspecified: Secondary | ICD-10-CM

## 2016-04-07 DIAGNOSIS — F329 Major depressive disorder, single episode, unspecified: Secondary | ICD-10-CM

## 2016-04-07 DIAGNOSIS — I2583 Coronary atherosclerosis due to lipid rich plaque: Secondary | ICD-10-CM

## 2016-04-07 DIAGNOSIS — I2510 Atherosclerotic heart disease of native coronary artery without angina pectoris: Secondary | ICD-10-CM

## 2016-04-07 DIAGNOSIS — I10 Essential (primary) hypertension: Secondary | ICD-10-CM | POA: Diagnosis not present

## 2016-04-07 DIAGNOSIS — F419 Anxiety disorder, unspecified: Secondary | ICD-10-CM

## 2016-04-07 MED ORDER — EZETIMIBE 10 MG PO TABS: 10.0000 mg | ORAL_TABLET | Freq: Every day | ORAL | 3 refills | Status: DC

## 2016-04-07 MED ORDER — MOMETASONE FURO-FORMOTEROL FUM 100-5 MCG/ACT IN AERO: 2.0000 | INHALATION_SPRAY | Freq: Two times a day (BID) | RESPIRATORY_TRACT | 0 refills | Status: DC

## 2016-04-07 MED ORDER — CLOPIDOGREL BISULFATE 75 MG PO TABS: 75.0000 mg | ORAL_TABLET | Freq: Every day | ORAL | 3 refills | Status: DC

## 2016-04-07 MED ORDER — PANTOPRAZOLE SODIUM 20 MG PO TBEC: 20.0000 mg | DELAYED_RELEASE_TABLET | Freq: Every day | ORAL | 3 refills | Status: DC

## 2016-04-07 MED ORDER — METOPROLOL TARTRATE 50 MG PO TABS: 25.0000 mg | ORAL_TABLET | Freq: Two times a day (BID) | ORAL | 3 refills | Status: DC

## 2016-04-07 MED ORDER — HYDROCHLOROTHIAZIDE 25 MG PO TABS: 12.5000 mg | ORAL_TABLET | Freq: Every day | ORAL | 1 refills | Status: DC

## 2016-04-07 MED ORDER — TRAZODONE HCL 50 MG PO TABS: 50.0000 mg | ORAL_TABLET | Freq: Every evening | ORAL | 3 refills | Status: DC | PRN

## 2016-04-07 MED ORDER — LAMOTRIGINE 150 MG PO TABS: 150.0000 mg | ORAL_TABLET | Freq: Every day | ORAL | 1 refills | Status: DC

## 2016-04-07 MED ORDER — HYDROXYZINE HCL 25 MG PO TABS: 25.0000 mg | ORAL_TABLET | Freq: Three times a day (TID) | ORAL | 1 refills | Status: DC | PRN

## 2016-04-07 MED ORDER — ATORVASTATIN CALCIUM 80 MG PO TABS: 80.0000 mg | ORAL_TABLET | Freq: Every day | ORAL | 3 refills | Status: DC

## 2016-04-07 NOTE — Patient Instructions (Addendum)
IF you received an x-ray today, you will receive an invoice from [facility]. Please contact [facility] at [phone] with questions or concerns regarding your invoice.   IF you received labwork today, you will receive an invoice from [facility]. Please contact [facility] at [phone] with questions or concerns regarding your invoice.   Our billing staff will not be able to assist you with questions regarding bills from these companies.  You will be contacted with the lab results as soon as they are available. The fastest way to get your results is to activate your My Chart account. Instructions are located on the last page of this paperwork. If you have not heard from us regarding the results in 2 weeks, please contact this office.      Coronary Artery Disease, Male Coronary artery disease (CAD) is a process in which the blood vessels of the heart (coronary arteries) become narrow or blocked. The narrowing or blockage can lead to decreased blood flow to the heart muscle (angina). Prolonged reduced blood flow can cause a heart attack (myocardial infarction, MI). Because CAD is the leading cause of death in men, it is important to understand what causes this condition and how it is treated. What are the causes? Atherosclerosis is the cause of CAD. This is the buildup of fat and cholesterol (plaque) on the inside of the arteries. Over time, the plaque may narrow or block the artery, and this will lessen blood flow to the heart. Plaque can also become weak and break off within a coronary artery to form a clot and cause a sudden blockage. What increases the risk? Many risk factors increase your chances of getting CAD, including:  High cholesterol levels.  High blood pressure (hypertension).  Tobacco use.  Diabetes.  Age. Men over age 47 are at a greater risk of CAD.  Gender. Men often develop CAD earlier in life than women.  Family history of  CAD.  Obesity.  Lack of exercise.  A diet high in saturated fats. What are the signs or symptoms? Many people do not experience any symptoms during the early stages of CAD. As the condition progresses, symptoms may include:  Chest pain.  The pain can be described as a crushing or squeezing in the chest, or a tightness, pressure, fullness, or heaviness in the chest.  The pain can last more than a few minutes or can stop and recur.  Pain in the arms, neck, jaw, or back.  Unexplained heartburn or indigestion.  Shortness of breath.  Nausea.  Sudden cold sweats. Less common symptoms of CAD in men can include:  Fatigue.  Unexplained feelings of nervousness or anxiety.  Weakness.  Diarrhea.  Sudden light-headedness. How is this diagnosed? Tests to diagnose CAD may include:  ECG (electrocardiogram).  Exercise stress test. This looks for signs of blockage when the heart is being exercised.  Pharmacologic stress test. This test looks for signs of blockage when the heart is being stressed with a medicine.  Blood tests.  Coronary angiogram. This is a procedure to look at the coronary arteries to see if there is any blockage. How is this treated? The treatment of CAD may include the following:  Healthy behavioral changes to reduce or control risk factors.  Medicine.  Coronary stenting.A stent helps to keep an artery open.  Coronary angioplasty. This procedure widens a narrowed or blocked artery.  Coronary arterybypass surgery. This will allow your blood to pass the blockage (bypass) to reach your heart.  Follow these instructions at home:  Take medicines only as directed by your health care provider.  Do not take the following medicines unless your health care provider approves:  Nonsteroidal anti-inflammatory drugs (NSAIDs), such as ibuprofen, naproxen, or celecoxib.  Vitamin supplements that contain vitamin A, vitamin E, or both.  Manage other health  conditions such as hypertension and diabetes as directed by your health care provider.  Follow a heart-healthy diet. A dietitian can help to educate you about healthy food options and changes.  Use healthy cooking methods such as roasting, grilling, broiling, baking, poaching, steaming, or stir-frying. Talk to a dietitian to learn more about healthy cooking methods.  Follow an exercise program approved by your health care provider.  Maintain a healthy weight. Lose weight as approved by your health care provider.  Plan rest periods when fatigued.  Learn to manage stress.  Do not use any tobacco products, including cigarettes, chewing tobacco, or electronic cigarettes. If you need help quitting, ask your health care provider.  If you drink alcohol, and your health care provider approves, limit your alcohol intake to no more than 1 drink per day. One drink equals 12 ounces of beer, 5 ounces of wine, or 1 ounces of hard liquor.  Stop illegal drug use.  Your health care provider may ask you to monitor your blood pressure. A blood pressure reading consists of a higher number over a lower number, such as 110 over 72, which is written as 110/72. Ideally, your blood pressure should be:  Below 140/90 if you have no other medical conditions.  Below 130/80 if you have diabetes or kidney disease.  Keep all follow-up visits as directed by your health care provider. This is important. Get help right away if:  You have pain in your chest, neck, arm, jaw, stomach, or back that lasts more than a few minutes, is recurring, or is unrelieved by taking medicine under your tongue (sublingualnitroglycerin).  You have profuse sweating without cause.  You have unexplained:  Heartburn or indigestion.  Shortness of breath or difficulty breathing.  Nausea or vomiting.  Fatigue.  Feelings of nervousness or anxiety.  Weakness.  Diarrhea.  You have sudden light-headedness or dizziness.  You  faint. These symptoms may represent a serious problem that is an emergency. Do not wait to see if the symptoms will go away. Get medical help right away. Call your local emergency services (911 in the [place] Do not drive yourself to the hospital.  This information is not intended to replace advice given to you by your health care provider. Make sure you discuss any questions you have with your health care provider. Document Released: 09/10/2013 Document Revised: 07/22/2015 Document Reviewed: 06/17/2013 Elsevier Interactive Patient Education  2017 [employer].

## 2016-04-07 NOTE — Progress Notes (Signed)
Chief Complaint  Patient presents with  . Medication Refill    every med but wellbutrin dulera and nitro     HPI  Patient reports that his job is stressing him out and so he was smoking a few more this week  He has follow up with Psychiatrist in 2 weeks He states that he has been on the lamictal and hydroxyzine but he states that he does not think his lamictal is working He states that the zyban causes him to lose the appetite for cigarettes and so he typically can only take a "few drags". 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 Anxiety   . Aortic insufficiency    moderate by echo 04/2014  . Coronary artery disease 03/24/2014   NSTEMI s/p PCI of mid LAD and repeat cath with patent stent  . Depression   . Hypertension   . Tobacco abuse     Current Outpatient Prescriptions  Medication Sig Dispense Refill  . acetaminophen (TYLENOL) 500 MG tablet Take 1-2 tablets (500-1,000 mg total) by mouth every 6 (six) hours as needed for mild pain, moderate pain, fever or headache. 30 tablet 0  . aspirin EC 81 MG tablet Take 81 mg by mouth every morning.    Marland Kitchen atorvastatin (LIPITOR) 80 MG tablet Take 1 tablet (80 mg total) by mouth daily at 6 PM. For high cholesterol 90 tablet 3  . buPROPion (WELLBUTRIN SR) 150 MG 12 hr tablet Take 1 tablet (150 mg total) by mouth 2 (two) times daily. 60 tablet 3  . clopidogrel (PLAVIX) 75 MG tablet Take 1 tablet (75 mg total) by mouth daily. 90 tablet 3  . ezetimibe (ZETIA) 10 MG tablet Take 1 tablet (10 mg total) by mouth daily. 90 tablet 3  . hydrochlorothiazide (HYDRODIURIL) 25 MG tablet Take 0.5 tablets (12.5 mg total) by mouth daily. 45 tablet 1  . hydrOXYzine (ATARAX/VISTARIL) 25 MG  tablet Take 1 tablet (25 mg total) by mouth 3 (three) times daily with meals as needed for anxiety. 90 tablet 1  . ibuprofen (ADVIL,MOTRIN) 200 MG tablet Take 400 mg by mouth 2 (two) times daily as needed for moderate pain.    Marland Kitchen lamoTRIgine (LAMICTAL) 150 MG tablet Take 1 tablet (150 mg total) by mouth daily. 30 tablet 1  . metoprolol (LOPRESSOR) 50 MG tablet Take 0.5 tablets (25 mg total) by mouth 2 (two) times daily. 90 tablet 3  . mometasone-formoterol (DULERA) 100-5 MCG/ACT AERO Inhale 2 puffs into the lungs 2 (two) times daily. 13 g 0  . nitroGLYCERIN (NITROSTAT) 0.4 MG SL tablet Place 1 tablet (0.4 mg total) under the tongue every 5 (five) minutes as needed for chest pain. 30 tablet 0  . pantoprazole (PROTONIX) 20 MG tablet Take 1 tablet (20 mg total) by mouth daily. 90 tablet 3  . traZODone (DESYREL) 50 MG tablet Take 1 tablet (50 mg total) by mouth at bedtime as needed for sleep. 30 tablet 3   No current facility-administered medications for this visit.     Allergies:  Allergies  Allergen Reactions  . Hydrocodone Hives    Past Surgical History:  Procedure Laterality Date  . CARDIAC CATHETERIZATION  03/04/2014   PCI of mid  LAD  . CARDIAC CATHETERIZATION N/A 11/12/2015   Procedure: Left Heart Cath and Coronary Angiography;  Surgeon: Tonny Bollman, MD;  Location: [facility] INVASIVE CV LAB;  Service: Cardiovascular;  Laterality: N/A;  . LEFT HEART CATHETERIZATION WITH CORONARY ANGIOGRAM Bilateral 03/04/2014   Procedure: LEFT HEART CATHETERIZATION WITH CORONARY ANGIOGRAM;  Surgeon: Kathleene Hazel, MD;  Location: [facility] CATH LAB;  Service: Cardiovascular;  Laterality: Bilateral;  . LEFT HEART CATHETERIZATION WITH CORONARY ANGIOGRAM N/A 03/26/2014   Procedure: LEFT HEART CATHETERIZATION WITH CORONARY ANGIOGRAM;  Surgeon: Lennette Bihari, MD;  Location: [facility] CATH LAB;  Service: Cardiovascular;  Laterality: N/A;    Social History   Social History  . Marital status: Legally Separated    Spouse  name: N/A  . Number of children: N/A  . Years of education: N/A   Social History Main Topics  . Smoking status: Current Every Day Smoker    Packs/day: 0.50    Years: 30.00    Types: Cigarettes  . Smokeless tobacco: Never Used  . Alcohol use 1.8 oz/week    3 Cans of beer per week  . Drug use: Yes    Types: Marijuana     Comment: 11/11/2015 "a couple times/week"  . Sexual activity: Yes    Birth control/ protection: Condom   Other Topics Concern  . Not on file   Social History Narrative  . No narrative on file    Review of Systems  Constitutional: Negative for chills and fever.  Eyes: Negative for blurred vision and double vision.  Cardiovascular: Negative for chest pain, palpitations and orthopnea.  Gastrointestinal: Negative for heartburn, nausea and vomiting.  Skin: Negative for itching and rash.  Neurological: Negative for dizziness, tingling and headaches.    Objective: Vitals:   04/07/16 1824  BP: 124/80  Pulse: 77  Resp: 17  Temp: 97.8 F (36.6 C)  TempSrc: Oral  SpO2: 98%  Weight: 160 lb (72.6 kg)  Height: 5\' 7"  (1.702 m)    Physical Exam Physical Exam  Constitutional: He is oriented to person, place, and time. He appears well-developed and well-nourished.  HENT:  Head: Normocephalic and atraumatic.  Right Ear: External ear normal.  Left Ear: External ear normal.  Nose: Nose normal.  Mouth/Throat: Oropharynx is clear and moist. No oropharyngeal exudate.  Eyes: Conjunctivae and EOM are normal.  Cardiovascular: Normal rate and regular rhythm.   Murmur heard. Pulmonary/Chest: Effort normal and breath sounds normal. No respiratory distress. He has no wheezes.  Abdominal: Soft. Bowel sounds are normal. He exhibits no distension. No hernia.  Musculoskeletal: Normal range of motion.  Neurological: He is alert and oriented to person, place, and time.  Skin: Skin is warm. Capillary refill takes less than 2 seconds.  Psychiatric: He has a normal mood and  affect. His behavior is normal. Judgment and thought content normal.   Assessment and Plan Christapher was seen today for medication refill.  Diagnoses and all orders for this visit:  Encounter for medication refill- creatinine hovering around 1.2 discussed avoidance of NSAIDs -     Basic metabolic panel -     atorvastatin (LIPITOR) 80 MG tablet; Take 1 tablet (80 mg total) by mouth daily at 6 PM. For high cholesterol -     pantoprazole (PROTONIX) 20 MG tablet; Take 1 tablet (20 mg total) by mouth daily.  Major depressive disorder, recurrent episode, moderate (HCC)- continue current meds Continue exercise Continue with Psychiatry -     lamoTRIgine (LAMICTAL) 150 MG tablet; Take 1 tablet (  150 mg total) by mouth daily. -     traZODone (DESYREL) 50 MG tablet; Take 1 tablet (50 mg total) by mouth at bedtime as needed for sleep. -     hydrOXYzine (ATARAX/VISTARIL) 25 MG tablet; Take 1 tablet (25 mg total) by mouth 3 (three) times daily with meals as needed for anxiety.  Essential hypertension- continue current bp meds Discussed the impact of stress on bp -     metoprolol (LOPRESSOR) 50 MG tablet; Take 0.5 tablets (25 mg total) by mouth 2 (two) times daily.       -     hydrochlorothiazide (HYDRODIURIL) 25 MG tablet; Take 0.5 tablets (12.5 mg total) by mouth daily.  Coronary artery disease due to lipid rich plaque- refilled plavix and zetia -     clopidogrel (PLAVIX) 75 MG tablet; Take 1 tablet (75 mg total) by mouth daily. -     ezetimibe (ZETIA) 10 MG tablet; Take 1 tablet (10 mg total) by mouth daily.  Anxiety and depression- continue with Psychiatry for evaluation  Pulmonary emphysema, unspecified emphysema type (HCC)- stable, cpm -     mometasone-formoterol (DULERA) 100-5 MCG/ACT AERO; Inhale 2 puffs into the lungs 2 (two) times daily.    Ayelet Gruenewald A Griffin Dewilde

## 2016-04-08 LAB — BASIC METABOLIC PANEL
BUN / CREAT RATIO: 10 (ref 9–20)
BUN: 13 mg/dL (ref 6–24)
CALCIUM: 9.4 mg/dL (ref 8.7–10.2)
CHLORIDE: 98 mmol/L (ref 96–106)
CO2: 24 mmol/L (ref 18–29)
Creatinine, Ser: 1.25 mg/dL (ref 0.76–1.27)
GFR calc non Af Amer: 69 mL/min/{1.73_m2} (ref >59)
GFR, EST AFRICAN AMERICAN: 79 mL/min/{1.73_m2} (ref >59)
Glucose: 86 mg/dL (ref 65–99)
POTASSIUM: 4.3 mmol/L (ref 3.5–5.2)
Sodium: 136 mmol/L (ref 134–144)

## 2016-04-20 ENCOUNTER — Encounter (HOSPITAL_COMMUNITY): Payer: Self-pay | Admitting: Psychiatry

## 2016-04-20 ENCOUNTER — Ambulatory Visit (INDEPENDENT_AMBULATORY_CARE_PROVIDER_SITE_OTHER): Payer: Managed Care, Other (non HMO) | Admitting: Psychiatry

## 2016-04-20 DIAGNOSIS — F331 Major depressive disorder, recurrent, moderate: Secondary | ICD-10-CM

## 2016-04-20 DIAGNOSIS — Z79899 Other long term (current) drug therapy: Secondary | ICD-10-CM

## 2016-04-20 DIAGNOSIS — F17210 Nicotine dependence, cigarettes, uncomplicated: Secondary | ICD-10-CM | POA: Diagnosis not present

## 2016-04-20 DIAGNOSIS — F1011 Alcohol abuse, in remission: Secondary | ICD-10-CM | POA: Diagnosis not present

## 2016-04-20 DIAGNOSIS — Z7982 Long term (current) use of aspirin: Secondary | ICD-10-CM | POA: Diagnosis not present

## 2016-04-20 DIAGNOSIS — Z8249 Family history of ischemic heart disease and other diseases of the circulatory system: Secondary | ICD-10-CM

## 2016-04-20 DIAGNOSIS — Z811 Family history of alcohol abuse and dependence: Secondary | ICD-10-CM | POA: Diagnosis not present

## 2016-04-20 DIAGNOSIS — Z9889 Other specified postprocedural states: Secondary | ICD-10-CM

## 2016-04-20 DIAGNOSIS — Z833 Family history of diabetes mellitus: Secondary | ICD-10-CM

## 2016-04-20 DIAGNOSIS — Z888 Allergy status to other drugs, medicaments and biological substances status: Secondary | ICD-10-CM

## 2016-04-20 DIAGNOSIS — F1210 Cannabis abuse, uncomplicated: Secondary | ICD-10-CM | POA: Diagnosis not present

## 2016-04-20 MED ORDER — LAMOTRIGINE 200 MG PO TABS: 200.0000 mg | ORAL_TABLET | Freq: Every day | ORAL | 1 refills | Status: DC

## 2016-04-20 NOTE — Progress Notes (Signed)
BH MD/PA/NP OP Progress Note  04/20/2016 3:22 PM Darren [name]  MRN:  [id number]  Chief Complaint:  Subjective:  I'm getting more emotional.  I'm sorry I could not come for appointment.  But I'm taking medication which is not prescribed by my primary care doctor.  HPI: Darren [name] came for his appointment.  He was [place] seen [place].  Patient mentioned he lost his job and out of insurance.  He was able to get his job back a few months ago and few weeks ago his insurance.  He endorse lately more irritable, angry, emotional and having poor sleep.  He is taking Lamictal trazodone and some time Vistaril.  Recently his primary care physician is started him on Wellbutrin to stop smoking.  He believes since then he's been more agitated irritable and angry.  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  He also endorse lately having crying spells which she had never before.  He endorse financial stress because even though he is working 80 hours a week most of his money is going into child support.  He endorse lately more withdrawn, isolated and crying spells.  He denies any suicidal thoughts or homicidal thought.  He admitted smoking marijuana on a regular basis to calm himself.  In past 6 months he's been to the emergency room twice because of chest pain however his EKG was normal.  Patient has no rash, itching or any shakes or tremors.  He denies any self abusive behavior.  Currently he is not seeing any therapist.  His appetite is okay.  His vital signs are stable.  Patient lives with his friends who has given him a space.  He has 5 children from multiple relationship.  Visit Diagnosis:    ICD-9-CM ICD-10-CM   1. Major depressive disorder, recurrent episode, moderate (HCC) 296.32 F33.1 lamoTRIgine (LAMICTAL) 200 MG tablet    Past Psychiatric History: Reviewed. Patient has at least 3 psychiatric hospitalization which is usually complicated by alcohol use, relationship issues and  severe depression.  Though he has no suicidal attempt but admitted suicidal thoughts and plan when he is severely depressed.  He had tried Lexapro, Valium in the past.  He has been arrested for drug charges. Currently he is not in any probation.  Past Medical History:  Past Medical History:  Diagnosis Date  . Anxiety   . Aortic insufficiency    moderate by echo 04/2014  . Coronary artery disease 03/24/2014   NSTEMI s/p PCI of mid LAD and repeat cath with patent stent  . Depression   . Hypertension   . Tobacco abuse     Past Surgical History:  Procedure Laterality Date  . CARDIAC CATHETERIZATION  03/04/2014   PCI of mid LAD  . CARDIAC CATHETERIZATION N/A 11/12/2015   Procedure: Left Heart Cath and Coronary Angiography;  Surgeon: Tonny Bollman, MD;  Location: [facility] INVASIVE CV LAB;  Service: Cardiovascular;  Laterality: N/A;  . LEFT HEART CATHETERIZATION WITH CORONARY ANGIOGRAM Bilateral 03/04/2014   Procedure: LEFT HEART CATHETERIZATION WITH CORONARY ANGIOGRAM;  Surgeon: Kathleene Hazel, MD;  Location: [facility] CATH LAB;  Service: Cardiovascular;  Laterality: Bilateral;  . LEFT HEART CATHETERIZATION WITH CORONARY ANGIOGRAM N/A 03/26/2014   Procedure: LEFT HEART CATHETERIZATION WITH CORONARY ANGIOGRAM;  Surgeon: Lennette Bihari, MD;  Location: [facility] CATH LAB;  Service: Cardiovascular;  Laterality: N/A;    Family Psychiatric History: Reviewed.  Family History:  Family History  Problem Relation Age of Onset  .  Hypertension Mother   . Hypertension Father   . Alcohol abuse Father   . Hypertension Other   . Diabetes Other   . Heart attack Other     Social History:  Social History   Social History  . Marital status: Legally Separated    Spouse name: N/A  . Number of children: N/A  . Years of education: N/A   Social History Main Topics  . Smoking status: Current Every Day Smoker    Packs/day: 0.50    Years: 30.00    Types: Cigarettes  . Smokeless tobacco: Never Used  . Alcohol use  1.8 oz/week    3 Cans of beer per week  . Drug use: Yes    Types: Marijuana     Comment: 11/11/2015 "a couple times/week"  . Sexual activity: Yes    Birth control/ protection: Condom   Other Topics Concern  . None   Social History Narrative  . None    Allergies:  Allergies  Allergen Reactions  . Hydrocodone Hives    Metabolic Disorder Labs: Recent Results (from the past 2160 hour(s))  Basic metabolic panel     Status: None   Collection Time: 04/07/16  6:22 PM  Result Value Ref Range   Glucose 86 65 - 99 mg/dL   BUN 13 6 - 24 mg/dL   Creatinine, Ser 4.78 0.76 - 1.27 mg/dL   GFR calc non Af Amer 69 >59 mL/min/1.73   GFR calc Af Amer 79 >59 mL/min/1.73   BUN/Creatinine Ratio 10 9 - 20   Sodium 136 134 - 144 mmol/L   Potassium 4.3 3.5 - 5.2 mmol/L   Chloride 98 96 - 106 mmol/L   CO2 24 18 - 29 mmol/L   Calcium 9.4 8.7 - 10.2 mg/dL   Lab Results  Component Value Date   HGBA1C 5.9 (H) 03/03/2014   MPG 123 (H) 03/03/2014   No results found for: PROLACTIN Lab Results  Component Value Date   CHOL 127 11/12/2015   TRIG 55 11/12/2015   HDL 42 11/12/2015   CHOLHDL 3.0 11/12/2015   VLDL 11 11/12/2015   LDLCALC 74 11/12/2015   LDLCALC 110 (H) 10/22/2014     Current Medications: Current Outpatient Prescriptions  Medication Sig Dispense Refill  . acetaminophen (TYLENOL) 500 MG tablet Take 1-2 tablets (500-1,000 mg total) by mouth every 6 (six) hours as needed for mild pain, moderate pain, fever or headache. 30 tablet 0  . aspirin EC 81 MG tablet Take 81 mg by mouth every morning.    Marland Kitchen atorvastatin (LIPITOR) 80 MG tablet Take 1 tablet (80 mg total) by mouth daily at 6 PM. For high cholesterol 90 tablet 3  . buPROPion (WELLBUTRIN SR) 150 MG 12 hr tablet Take 1 tablet (150 mg total) by mouth 2 (two) times daily. 60 tablet 3  . clopidogrel (PLAVIX) 75 MG tablet Take 1 tablet (75 mg total) by mouth daily. 90 tablet 3  . ezetimibe (ZETIA) 10 MG tablet Take 1 tablet (10 mg  total) by mouth daily. 90 tablet 3  . hydrochlorothiazide (HYDRODIURIL) 25 MG tablet Take 0.5 tablets (12.5 mg total) by mouth daily. 45 tablet 1  . hydrOXYzine (ATARAX/VISTARIL) 25 MG tablet Take 1 tablet (25 mg total) by mouth 3 (three) times daily with meals as needed for anxiety. 90 tablet 1  . ibuprofen (ADVIL,MOTRIN) 200 MG tablet Take 400 mg by mouth 2 (two) times daily as needed for moderate pain.    Marland Kitchen lamoTRIgine (LAMICTAL) 150  MG tablet Take 1 tablet (150 mg total) by mouth daily. 30 tablet 1  . metoprolol (LOPRESSOR) 50 MG tablet Take 0.5 tablets (25 mg total) by mouth 2 (two) times daily. 90 tablet 3  . mometasone-formoterol (DULERA) 100-5 MCG/ACT AERO Inhale 2 puffs into the lungs 2 (two) times daily. 13 g 0  . nitroGLYCERIN (NITROSTAT) 0.4 MG SL tablet Place 1 tablet (0.4 mg total) under the tongue every 5 (five) minutes as needed for chest pain. 30 tablet 0  . pantoprazole (PROTONIX) 20 MG tablet Take 1 tablet (20 mg total) by mouth daily. 90 tablet 3  . traZODone (DESYREL) 50 MG tablet Take 1 tablet (50 mg total) by mouth at bedtime as needed for sleep. 30 tablet 3   No current facility-administered medications for this visit.     Neurologic: Headache: No Seizure: No Paresthesias: No  Musculoskeletal: Strength & Muscle Tone: within normal limits Gait & Station: normal Patient leans: N/A  Psychiatric Specialty Exam: Review of Systems  Constitutional: Negative.   HENT: Negative.   Respiratory: Negative for cough.   Cardiovascular: Negative for chest pain.  Musculoskeletal: Negative.   Skin: Negative for rash.  Neurological: Negative.   Psychiatric/Behavioral: Positive for depression and substance abuse. The patient is nervous/anxious and has insomnia.     Blood pressure 140/82, pulse 64, resp. rate 12, height 5\' 7"  (1.702 m), weight 161 lb 9.6 oz (73.3 kg).Body mass index is 25.31 kg/m.  General Appearance: Casual  Eye Contact:  Fair  Speech:  Slow  Volume:   Normal  Mood:  Anxious and Depressed  Affect:  Constricted and Depressed  Thought Process:  Goal Directed  Orientation:  Full (Time, Place, and Person)  Thought Content: Rumination   Suicidal Thoughts:  No  Homicidal Thoughts:  No  Memory:  Immediate;   Fair Recent;   Fair Remote;   Fair  Judgement:  Fair  Insight:  Fair  Psychomotor Activity:  Decreased  Concentration:  Concentration: Fair and Attention Span: Fair  Recall:  Good  Fund of Knowledge: Good  Language: Good  Akathisia:  No  Handed:  Right  AIMS (if indicated):  0  Assets:  Communication Skills Desire for Improvement Resilience  ADL's:  Intact  Cognition: WNL  Sleep:  Fair    Assessment: Patient depressive disorder, recurrent.  Cannabis abuse.  Rule out bipolar disorder depressed type.  Alcohol abuse in partial remission  Plan: I review his records from emergency room, recent blood work results and records from other physicians.  His creatinine is 1.25 .  He was recently given Wellbutrin to stop smoking and since then he has noticed more irritability, frustration, emotional and having crying spells.  I recommended to stop the Wellbutrin.  I also recommended to increase Lamictal 200 mg to help her mood lability.  Continue trazodone as needed for insomnia.  He has not taken Vistaril and I recommended that he should take it more often to help anxiety.  I also believe he should resume therapy with Tomma LightningFrankie which was helping him before.  Follow-up in 4-6 weeks.Discussed medication side effects and benefits.  Recommended to call us back if there is any question, concern or worsening of the symptoms.  Discuss safety plan that anytime having active suicidal thoughts or homicidal thoughts and she need to call 911 or go to the local emergency room.  Shelbee Apgar T., MD 04/20/2016, 3:22 PM

## 2016-05-15 ENCOUNTER — Encounter (HOSPITAL_COMMUNITY): Payer: Self-pay | Admitting: Clinical

## 2016-05-15 ENCOUNTER — Ambulatory Visit (INDEPENDENT_AMBULATORY_CARE_PROVIDER_SITE_OTHER): Payer: Managed Care, Other (non HMO) | Admitting: Clinical

## 2016-05-15 DIAGNOSIS — F319 Bipolar disorder, unspecified: Secondary | ICD-10-CM
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
## 2016-05-31 ENCOUNTER — Ambulatory Visit (INDEPENDENT_AMBULATORY_CARE_PROVIDER_SITE_OTHER): Payer: Managed Care, Other (non HMO) | Admitting: Clinical

## 2016-05-31 ENCOUNTER — Encounter (HOSPITAL_COMMUNITY): Payer: Self-pay | Admitting: Clinical

## 2016-05-31 DIAGNOSIS — F319 Bipolar disorder, unspecified: Secondary | ICD-10-CM | POA: Diagnosis not present

## 2016-05-31 NOTE — Progress Notes (Signed)
   THERAPIST PROGRESS NOTE  Session Time: 7:05 -8:00   Participation Level: Active  Behavioral Response: CasualAlertDepressed  Type of Therapy: Individual Therapy  Treatment Goals addressed: improve psychiatric symptoms, moderate mood improve unhelpful thought patterns, learn about diagnosis, healthy coping skills  Interventions: CBT, Motivational Interviewing, Mindfulness and Grounding  Summary: Darren [name] is a 46  y.o. male who presents with Bipolar I disorder with anxious distress  Suicidal/Homicidal: No -without intent/plan  Therapist Response: Darren [name] met with clinician for an individual session. He discussed his psychiatric symptoms, his current life events and his goals for therapy. Darren [name] shared that he has had anxiety "50/50" this week. He shared that "people are getting on my nerves"  He reported his depression is high. He shared that he had been thinking about his mother's 1999/04/11) and his best friend's 2012-04-10) death. Clinician asked open ended questions and Darren [name] shared his thoughts and emotions. Clinician introduced basic cbt concepts. Client and clinician discussed the thought emotion connection. Clinician introduced some grounding and mindfulness techniques. Clinician explained the process , purpose and practice. Client and clinician practiced the techniques together. Darren [name] asked clarifying questions which clinician answered them. Clinician gave Darren [name] some homework packets on Bipolar which he agreed to complete and bring back with him to next session.  Plan: Return again in 1-2 weeks.  Diagnosis: Axis I Bipolar I disorder with anxious distress   Jody Silas A, LCSW 05/31/2016

## 2016-06-08 ENCOUNTER — Encounter (HOSPITAL_COMMUNITY): Payer: Self-pay | Admitting: Psychiatry

## 2016-06-08 ENCOUNTER — Ambulatory Visit (INDEPENDENT_AMBULATORY_CARE_PROVIDER_SITE_OTHER): Payer: Managed Care, Other (non HMO) | Admitting: Psychiatry

## 2016-06-08 VITALS — BP 130/84 | HR 68 | Ht 67.0 in | Wt 166.0 lb

## 2016-06-08 DIAGNOSIS — F1290 Cannabis use, unspecified, uncomplicated: Secondary | ICD-10-CM

## 2016-06-08 DIAGNOSIS — F1021 Alcohol dependence, in remission: Secondary | ICD-10-CM

## 2016-06-08 DIAGNOSIS — Z7982 Long term (current) use of aspirin: Secondary | ICD-10-CM | POA: Diagnosis not present

## 2016-06-08 DIAGNOSIS — F331 Major depressive disorder, recurrent, moderate: Secondary | ICD-10-CM | POA: Diagnosis not present

## 2016-06-08 DIAGNOSIS — Z811 Family history of alcohol abuse and dependence: Secondary | ICD-10-CM | POA: Diagnosis not present

## 2016-06-08 DIAGNOSIS — Z79899 Other long term (current) drug therapy: Secondary | ICD-10-CM | POA: Diagnosis not present

## 2016-06-08 DIAGNOSIS — Z87891 Personal history of nicotine dependence: Secondary | ICD-10-CM

## 2016-06-08 MED ORDER — ESCITALOPRAM OXALATE 10 MG PO TABS: 10.0000 mg | ORAL_TABLET | Freq: Every day | ORAL | 1 refills | Status: DC

## 2016-06-08 MED ORDER — LAMOTRIGINE 200 MG PO TABS: 200.0000 mg | ORAL_TABLET | Freq: Every day | ORAL | 1 refills | Status: DC

## 2016-06-08 MED ORDER — TRAZODONE HCL 50 MG PO TABS: 50.0000 mg | ORAL_TABLET | Freq: Every evening | ORAL | 1 refills | Status: DC | PRN

## 2016-06-08 NOTE — Progress Notes (Signed)
BH MD/PA/NP OP Progress Note  06/08/2016 11:51 AM Darren [name]  MRN:  [id number]  Chief Complaint:  Chief Complaint    Follow-up     Subjective:  I still feel anxious but my mood swings are much better.  HPI: Darren [name] came for his follow-up appointment.  He is no longer taking Wellbutrin which was given by his primary care physician to stop smoking.  He was having a lot of mood swings irritability and anger.  Since he cut down the Wellbutrin his mood swings are much better.  However he is still feel anxious nervous and continues to have anxiety and nervousness.  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  He is working 40 hours a week but most of his money goes to child support.  He admitted feeling isolated withdrawn and stays to himself most of the time.  Though he denies any suicidal thoughts or homicidal thoughts but admitted smoking marijuana to calm himself.  He denies any major panic attack.  He started seeing Darren [name] like to continue therapy.  Patient denies any self abusive behavior, paranoia, hallucination or any agitation.  His appetite is okay.  Patient lives with his friend who has given.  He has 5 children from multiple relationship. Visit Diagnosis:    ICD-9-CM ICD-10-CM   1. Major depressive disorder, recurrent episode, moderate (HCC) 296.32 F33.1 lamoTRIgine (LAMICTAL) 200 MG tablet     traZODone (DESYREL) 50 MG tablet     escitalopram (LEXAPRO) 10 MG tablet    Past Psychiatric History: Reviewed. Patient has at least 3 psychiatric hospitalization which is usually complicated by alcohol use, relationship issues and severe depression. Though he has no suicidal attempt but admitted suicidal thoughts and plan when he is severely depressed. He had tried Lexapro, Valium in the past. His primary care physician is started him on Wellbutrin but it make him more irritable .  He has been arrested for drug charges. Currently he is not in any  probation.  Past Medical History:  Past Medical History:  Diagnosis Date  . Anxiety   . Aortic insufficiency    moderate by echo 04/2014  . Coronary artery disease 03/24/2014   NSTEMI s/p PCI of mid LAD and repeat cath with patent stent  . Depression   . Hypertension   . Tobacco abuse     Past Surgical History:  Procedure Laterality Date  . CARDIAC CATHETERIZATION  03/04/2014   PCI of mid LAD  . CARDIAC CATHETERIZATION N/A 11/12/2015   Procedure: Left Heart Cath and Coronary Angiography;  Surgeon: Tonny Bollman, MD;  Location: [facility] INVASIVE CV LAB;  Service: Cardiovascular;  Laterality: N/A;  . LEFT HEART CATHETERIZATION WITH CORONARY ANGIOGRAM Bilateral 03/04/2014   Procedure: LEFT HEART CATHETERIZATION WITH CORONARY ANGIOGRAM;  Surgeon: Kathleene Hazel, MD;  Location: [facility] CATH LAB;  Service: Cardiovascular;  Laterality: Bilateral;  . LEFT HEART CATHETERIZATION WITH CORONARY ANGIOGRAM N/A 03/26/2014   Procedure: LEFT HEART CATHETERIZATION WITH CORONARY ANGIOGRAM;  Surgeon: Lennette Bihari, MD;  Location: [facility] CATH LAB;  Service: Cardiovascular;  Laterality: N/A;    Family Psychiatric History: Reviewed.  Family History:  Family History  Problem Relation Age of Onset  . Hypertension Mother   . Hypertension Father   . Alcohol abuse Father   . Hypertension Other   . Diabetes Other   . Heart attack Other     Social History:  Social History   Social History  .  Marital status: Legally Separated    Spouse name: N/A  . Number of children: N/A  . Years of education: N/A   Social History Main Topics  . Smoking status: Former Smoker    Packs/day: 0.50    Years: 30.00    Types: Cigarettes    Quit date: 12/29/2015  . Smokeless tobacco: Never Used  . Alcohol use No  . Drug use: Yes    Types: Marijuana     Comment: daily use  . Sexual activity: Not Currently    Birth control/ protection: Condom   Other Topics Concern  . None   Social History Narrative  . None     Allergies:  Allergies  Allergen Reactions  . Hydrocodone Hives    Metabolic Disorder Labs: Lab Results  Component Value Date   HGBA1C 5.9 (H) 03/03/2014   MPG 123 (H) 03/03/2014   No results found for: PROLACTIN Lab Results  Component Value Date   CHOL 127 11/12/2015   TRIG 55 11/12/2015   HDL 42 11/12/2015   CHOLHDL 3.0 11/12/2015   VLDL 11 11/12/2015   LDLCALC 74 11/12/2015   LDLCALC 110 (H) 10/22/2014     Current Medications: Current Outpatient Prescriptions  Medication Sig Dispense Refill  . acetaminophen (TYLENOL) 500 MG tablet Take 1-2 tablets (500-1,000 mg total) by mouth every 6 (six) hours as needed for mild pain, moderate pain, fever or headache. 30 tablet 0  . aspirin EC 81 MG tablet Take 81 mg by mouth every morning.    Marland Kitchen atorvastatin (LIPITOR) 80 MG tablet Take 1 tablet (80 mg total) by mouth daily at 6 PM. For high cholesterol 90 tablet 3  . clopidogrel (PLAVIX) 75 MG tablet Take 1 tablet (75 mg total) by mouth daily. 90 tablet 3  . ezetimibe (ZETIA) 10 MG tablet Take 1 tablet (10 mg total) by mouth daily. 90 tablet 3  . hydrochlorothiazide (HYDRODIURIL) 25 MG tablet Take 0.5 tablets (12.5 mg total) by mouth daily. 45 tablet 1  . lamoTRIgine (LAMICTAL) 200 MG tablet Take 1 tablet (200 mg total) by mouth daily. 30 tablet 1  . metoprolol (LOPRESSOR) 50 MG tablet Take 0.5 tablets (25 mg total) by mouth 2 (two) times daily. 90 tablet 3  . mometasone-formoterol (DULERA) 100-5 MCG/ACT AERO Inhale 2 puffs into the lungs 2 (two) times daily. 13 g 0  . nitroGLYCERIN (NITROSTAT) 0.4 MG SL tablet Place 1 tablet (0.4 mg total) under the tongue every 5 (five) minutes as needed for chest pain. 30 tablet 0  . pantoprazole (PROTONIX) 20 MG tablet Take 1 tablet (20 mg total) by mouth daily. 90 tablet 3  . traZODone (DESYREL) 50 MG tablet Take 1 tablet (50 mg total) by mouth at bedtime as needed for sleep. 30 tablet 1  . escitalopram (LEXAPRO) 10 MG tablet Take 1 tablet (10  mg total) by mouth daily. 30 tablet 1  . ibuprofen (ADVIL,MOTRIN) 200 MG tablet Take 400 mg by mouth 2 (two) times daily as needed for moderate pain.     No current facility-administered medications for this visit.     Neurologic: Headache: No Seizure: No Paresthesias: No  Musculoskeletal: Strength & Muscle Tone: within normal limits Gait & Station: normal Patient leans: N/A  Psychiatric Specialty Exam: ROS  Blood pressure 130/84, pulse 68, height  (1.702 m), weight 166 lb (75.3 kg), SpO2 98 %.Body mass index is 26 kg/m.  General Appearance: Casual  Eye Contact:  Good  Speech:  Clear and Coherent  Volume:  Normal  Mood:  Anxious and Depressed  Affect:  Constricted  Thought Process:  Goal Directed  Orientation:  Full (Time, Place, and Person)  Thought Content: Rumination   Suicidal Thoughts:  No  Homicidal Thoughts:  No  Memory:  Immediate;   Good Recent;   Good Remote;   Good  Judgement:  Good  Insight:  Good  Psychomotor Activity:  Normal  Concentration:  Concentration: Good and Attention Span: Good  Recall:  Good  Fund of Knowledge: Good  Language: Good  Akathisia:  No  Handed:  Right  AIMS (if indicated):  0  Assets:  Communication Skills Desire for Improvement  ADL's:  Intact  Cognition: WNL  Sleep:  Fair     Assessment: Major depressive disorder, recurrent.  Cannabis abuse.  Alcohol abuse in partial remission  Plan: Patient continues to have anxiety and depression.  He is no longer taking Wellbutrin.  Continue Lamictal 200 mg daily and trazodone 50 mg at bedtime.  I will add Lexapro 10 mg which had helped him in the past.  Encouraged to see Darren [name] for counseling.  Discussed medication side effects and benefits.  Recommended to call us back if he has any question, concern or worsening of the symptom.  Follow-up in 2 months.  Patient has no tremors shakes, rash or any itching.  Tifany Hirsch T., MD 06/08/2016, 11:51 AM

## 2016-06-14 ENCOUNTER — Ambulatory Visit (HOSPITAL_COMMUNITY): Payer: Self-pay | Admitting: Clinical

## 2016-06-28 ENCOUNTER — Ambulatory Visit (HOSPITAL_COMMUNITY): Payer: Self-pay | Admitting: Clinical

## 2016-07-12 ENCOUNTER — Ambulatory Visit (HOSPITAL_COMMUNITY): Payer: Self-pay | Admitting: Clinical

## 2016-07-13 NOTE — Progress Notes (Deleted)
No chief complaint on file.   HPI   Major Depression Pt went to [facility] and was weaned off zyban and started on lamictal 200mg , trazodone 50mg  and *lexapro. He reports that his mood is    Smoking Since stopping zyban the patient has smoked * cigarettes He states that his cravings have ** He is ** short of breath, wheezing or coughing.  Hypertension: Patient here for follow-up of elevated blood pressure. He {is/is not:9024} exercising and {is/is not:9024} adherent to low salt diet.  Blood pressure {is/is not:9024} well controlled at home. Cardiac symptoms {Symptoms; cardiac:12860}. Patient denies {Symptoms; cardiac:12860}.  Cardiovascular risk factors: {cv risk factors:510}. Use of agents associated with hypertension: {bp agents assoc with hypertension:511::"none"}. History of target organ damage: {target [phone]}. BP Readings from Last 3 Encounters:  06/08/16 130/84  04/20/16 140/82  04/07/16 124/80     Past Medical History:  Diagnosis Date  . Anxiety   . Aortic insufficiency    moderate by echo 04/2014  . Coronary artery disease 03/24/2014   NSTEMI s/p PCI of mid LAD and repeat cath with patent stent  . Depression   . Hypertension   . Tobacco abuse     Current Outpatient Prescriptions  Medication Sig Dispense Refill  . acetaminophen (TYLENOL) 500 MG tablet Take 1-2 tablets (500-1,000 mg total) by mouth every 6 (six) hours as needed for mild pain, moderate pain, fever or headache. 30 tablet 0  . aspirin EC 81 MG tablet Take 81 mg by mouth every morning.    Marland Kitchen atorvastatin (LIPITOR) 80 MG tablet Take 1 tablet (80 mg total) by mouth daily at 6 PM. For high cholesterol 90 tablet 3  . clopidogrel (PLAVIX) 75 MG tablet Take 1 tablet (75 mg total) by mouth daily. 90 tablet 3  . escitalopram (LEXAPRO) 10 MG tablet Take 1 tablet (10 mg total) by mouth daily. 30 tablet 1  . ezetimibe (ZETIA) 10 MG tablet Take 1 tablet (10 mg total) by mouth daily. 90 tablet 3  .  hydrochlorothiazide (HYDRODIURIL) 25 MG tablet Take 0.5 tablets (12.5 mg total) by mouth daily. 45 tablet 1  . ibuprofen (ADVIL,MOTRIN) 200 MG tablet Take 400 mg by mouth 2 (two) times daily as needed for moderate pain.    Marland Kitchen lamoTRIgine (LAMICTAL) 200 MG tablet Take 1 tablet (200 mg total) by mouth daily. 30 tablet 1  . metoprolol (LOPRESSOR) 50 MG tablet Take 0.5 tablets (25 mg total) by mouth 2 (two) times daily. 90 tablet 3  . mometasone-formoterol (DULERA) 100-5 MCG/ACT AERO Inhale 2 puffs into the lungs 2 (two) times daily. 13 g 0  . nitroGLYCERIN (NITROSTAT) 0.4 MG SL tablet Place 1 tablet (0.4 mg total) under the tongue every 5 (five) minutes as needed for chest pain. 30 tablet 0  . pantoprazole (PROTONIX) 20 MG tablet Take 1 tablet (20 mg total) by mouth daily. 90 tablet 3  . traZODone (DESYREL) 50 MG tablet Take 1 tablet (50 mg total) by mouth at bedtime as needed for sleep. 30 tablet 1   No current facility-administered medications for this visit.     Allergies:  Allergies  Allergen Reactions  . Hydrocodone Hives    Past Surgical History:  Procedure Laterality Date  . CARDIAC CATHETERIZATION  03/04/2014   PCI of mid LAD  . CARDIAC CATHETERIZATION N/A 11/12/2015   Procedure: Left Heart Cath and Coronary Angiography;  Surgeon: Tonny Bollman, MD;  Location: [facility] INVASIVE CV LAB;  Service: Cardiovascular;  Laterality: N/A;  . LEFT HEART  CATHETERIZATION WITH CORONARY ANGIOGRAM Bilateral 03/04/2014   Procedure: LEFT HEART CATHETERIZATION WITH CORONARY ANGIOGRAM;  Surgeon: Kathleene Hazelhristopher D McAlhany, MD;  [facility];  Service: Cardiovascular;  Laterality: Bilateral;  . LEFT HEART CATHETERIZATION WITH CORONARY ANGIOGRAM N/A 03/26/2014   Procedure: LEFT HEART CATHETERIZATION WITH CORONARY ANGIOGRAM;  Surgeon: Lennette Biharihomas A Kelly, MD;  [facility];  Service: Cardiovascular;  Laterality: N/A;    Social History   Social History  . Marital status: Legally Separated    Spouse  name: N/A  . Number of children: N/A  . Years of education: N/A   Social History Main Topics  . Smoking status: Former Smoker    Packs/day: 0.50    Years: 30.00    Types: Cigarettes    Quit date: 12/29/2015  . Smokeless tobacco: Never Used  . Alcohol use No  . Drug use: Yes    Types: Marijuana     Comment: daily use  . Sexual activity: Not Currently    Birth control/ protection: Condom   Other Topics Concern  . Not on file   Social History Narrative  . No narrative on file    ROS  Objective: There were no vitals filed for this visit.  Physical Exam  Assessment and Plan There are no diagnoses linked to this encounter.   [name] A Camarie Mctigue

## 2016-07-14 ENCOUNTER — Ambulatory Visit: Payer: Managed Care, Other (non HMO) | Admitting: Family Medicine

## 2016-07-28 ENCOUNTER — Ambulatory Visit: Payer: Managed Care, Other (non HMO) | Admitting: Family Medicine

## 2016-08-08 ENCOUNTER — Ambulatory Visit (HOSPITAL_COMMUNITY): Payer: Self-pay | Admitting: Psychiatry

## 2016-08-17 DIAGNOSIS — Z743 Need for continuous supervision: Secondary | ICD-10-CM | POA: Diagnosis not present

## 2016-08-17 DIAGNOSIS — R279 Unspecified lack of coordination: Secondary | ICD-10-CM | POA: Diagnosis not present

## 2016-09-09 DIAGNOSIS — Z720 Tobacco use: Secondary | ICD-10-CM | POA: Diagnosis not present

## 2016-09-09 DIAGNOSIS — I10 Essential (primary) hypertension: Secondary | ICD-10-CM | POA: Diagnosis not present

## 2016-09-09 DIAGNOSIS — Z951 Presence of aortocoronary bypass graft: Secondary | ICD-10-CM | POA: Diagnosis not present

## 2016-09-09 DIAGNOSIS — Z9119 Patient's noncompliance with other medical treatment and regimen: Secondary | ICD-10-CM | POA: Diagnosis not present

## 2016-09-09 DIAGNOSIS — I2510 Atherosclerotic heart disease of native coronary artery without angina pectoris: Secondary | ICD-10-CM | POA: Diagnosis not present

## 2016-09-09 DIAGNOSIS — F329 Major depressive disorder, single episode, unspecified: Secondary | ICD-10-CM | POA: Diagnosis not present

## 2016-09-09 DIAGNOSIS — I739 Peripheral vascular disease, unspecified: Secondary | ICD-10-CM | POA: Diagnosis not present

## 2016-09-09 DIAGNOSIS — K219 Gastro-esophageal reflux disease without esophagitis: Secondary | ICD-10-CM | POA: Diagnosis not present

## 2016-09-09 DIAGNOSIS — Z7901 Long term (current) use of anticoagulants: Secondary | ICD-10-CM | POA: Diagnosis not present

## 2016-09-09 DIAGNOSIS — M79662 Pain in left lower leg: Secondary | ICD-10-CM | POA: Diagnosis not present

## 2016-09-10 DIAGNOSIS — I10 Essential (primary) hypertension: Secondary | ICD-10-CM | POA: Diagnosis not present

## 2016-09-10 DIAGNOSIS — E785 Hyperlipidemia, unspecified: Secondary | ICD-10-CM | POA: Diagnosis not present

## 2016-09-10 DIAGNOSIS — Z888 Allergy status to other drugs, medicaments and biological substances status: Secondary | ICD-10-CM | POA: Diagnosis not present

## 2016-09-10 DIAGNOSIS — I739 Peripheral vascular disease, unspecified: Secondary | ICD-10-CM | POA: Diagnosis not present

## 2016-09-10 DIAGNOSIS — Z86718 Personal history of other venous thrombosis and embolism: Secondary | ICD-10-CM | POA: Diagnosis not present

## 2016-09-10 DIAGNOSIS — K219 Gastro-esophageal reflux disease without esophagitis: Secondary | ICD-10-CM | POA: Diagnosis not present

## 2016-09-13 DIAGNOSIS — E785 Hyperlipidemia, unspecified: Secondary | ICD-10-CM | POA: Diagnosis not present

## 2016-09-13 DIAGNOSIS — Z86718 Personal history of other venous thrombosis and embolism: Secondary | ICD-10-CM | POA: Diagnosis not present

## 2016-09-13 DIAGNOSIS — I2510 Atherosclerotic heart disease of native coronary artery without angina pectoris: Secondary | ICD-10-CM | POA: Diagnosis not present

## 2016-09-13 DIAGNOSIS — Z888 Allergy status to other drugs, medicaments and biological substances status: Secondary | ICD-10-CM | POA: Diagnosis not present

## 2016-09-13 DIAGNOSIS — R0789 Other chest pain: Secondary | ICD-10-CM | POA: Diagnosis not present

## 2016-09-13 DIAGNOSIS — Z951 Presence of aortocoronary bypass graft: Secondary | ICD-10-CM | POA: Diagnosis not present

## 2016-09-13 DIAGNOSIS — I10 Essential (primary) hypertension: Secondary | ICD-10-CM | POA: Diagnosis not present

## 2016-09-13 DIAGNOSIS — K219 Gastro-esophageal reflux disease without esophagitis: Secondary | ICD-10-CM | POA: Diagnosis not present

## 2016-12-23 IMAGING — CR DG CHEST 2V
2 series · 2 of 2 positions shown · non-contrast
Comparison: PA and lateral chest x-ray November 10, 2015

CLINICAL DATA: Left lateral chest pain, shortness of breath,
congestion, productive cough, and nausea and vomiting for the past
10 days. Current smoker. History of previous MI and, aortic
insufficiency.

EXAM:
CHEST  2 VIEW

[chest pa]
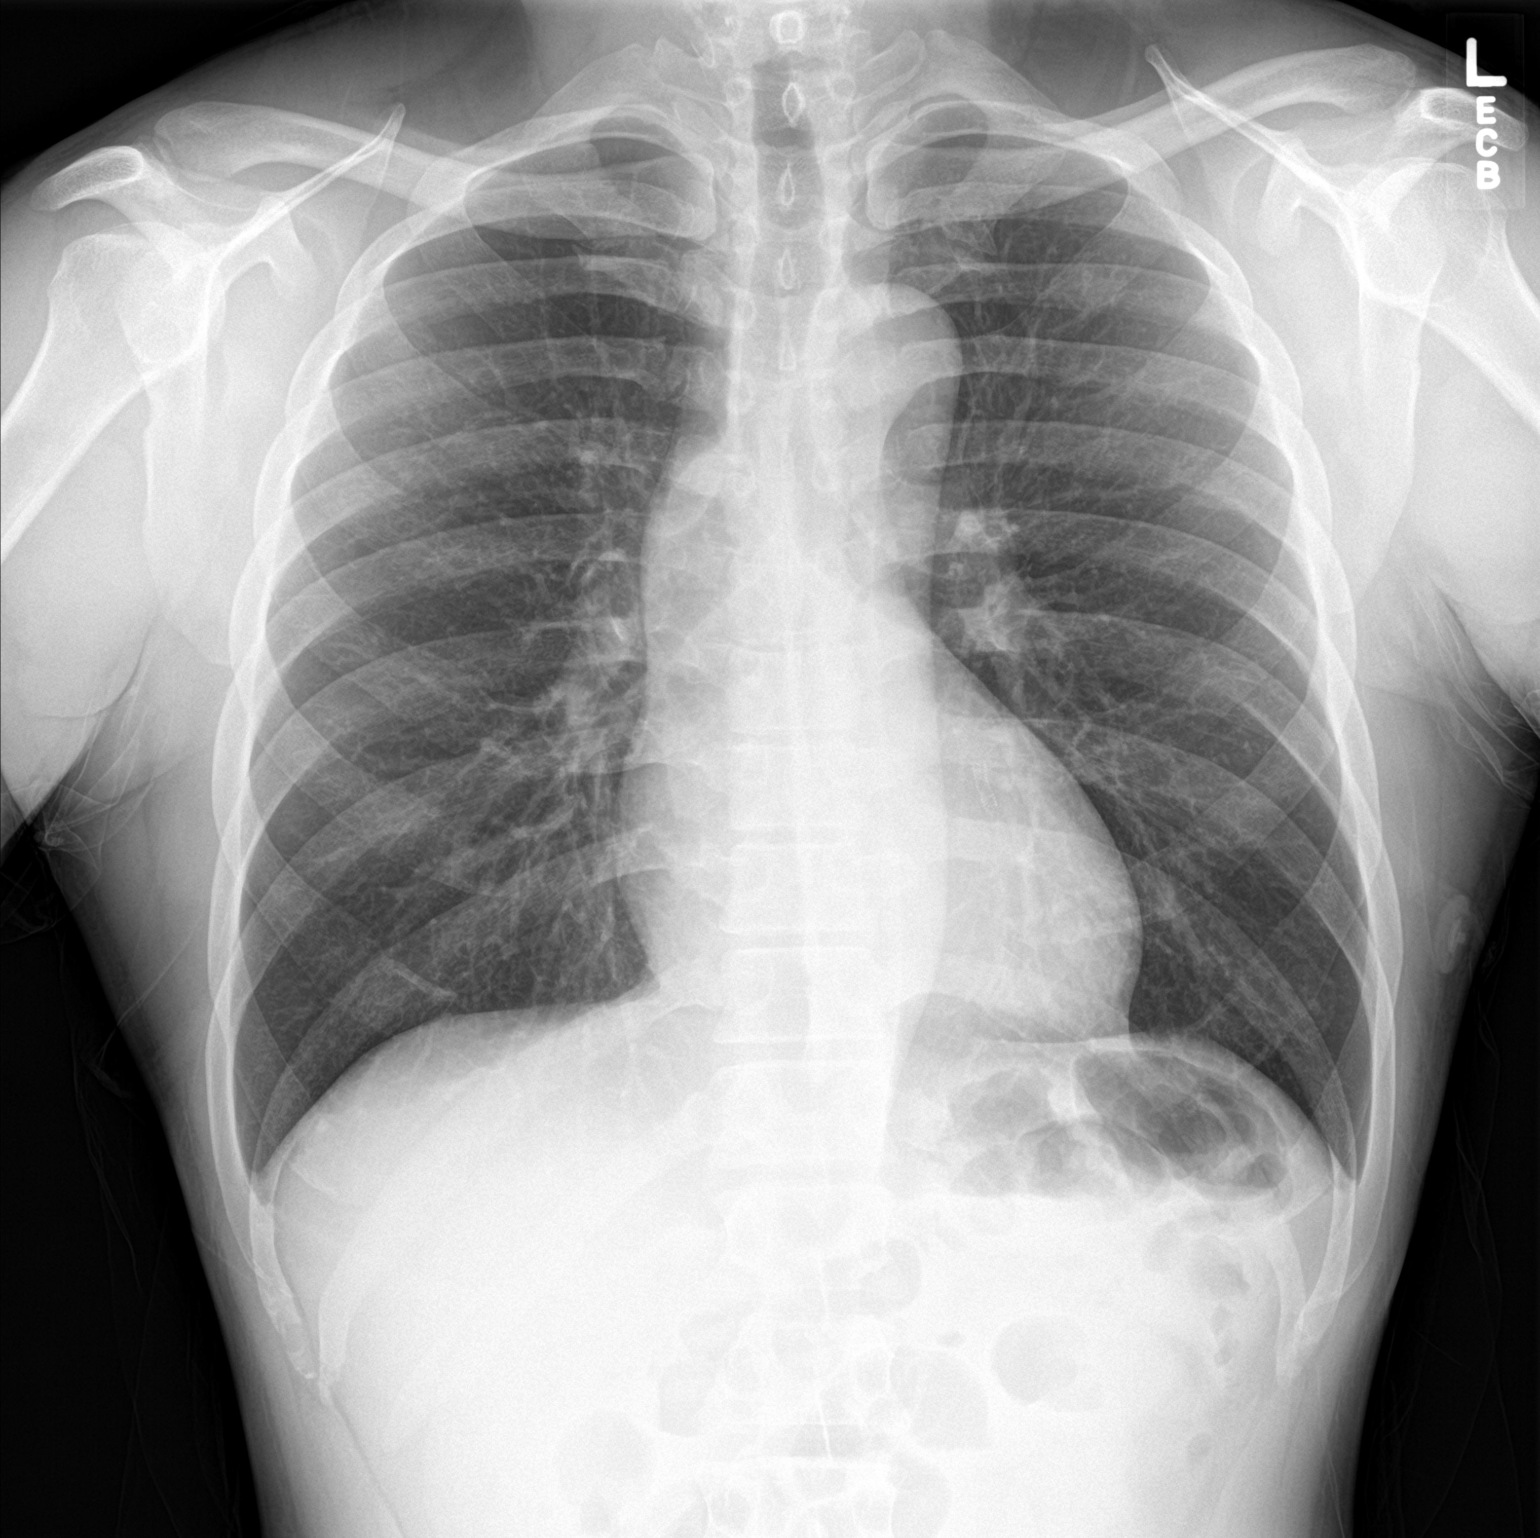

[chest lat]
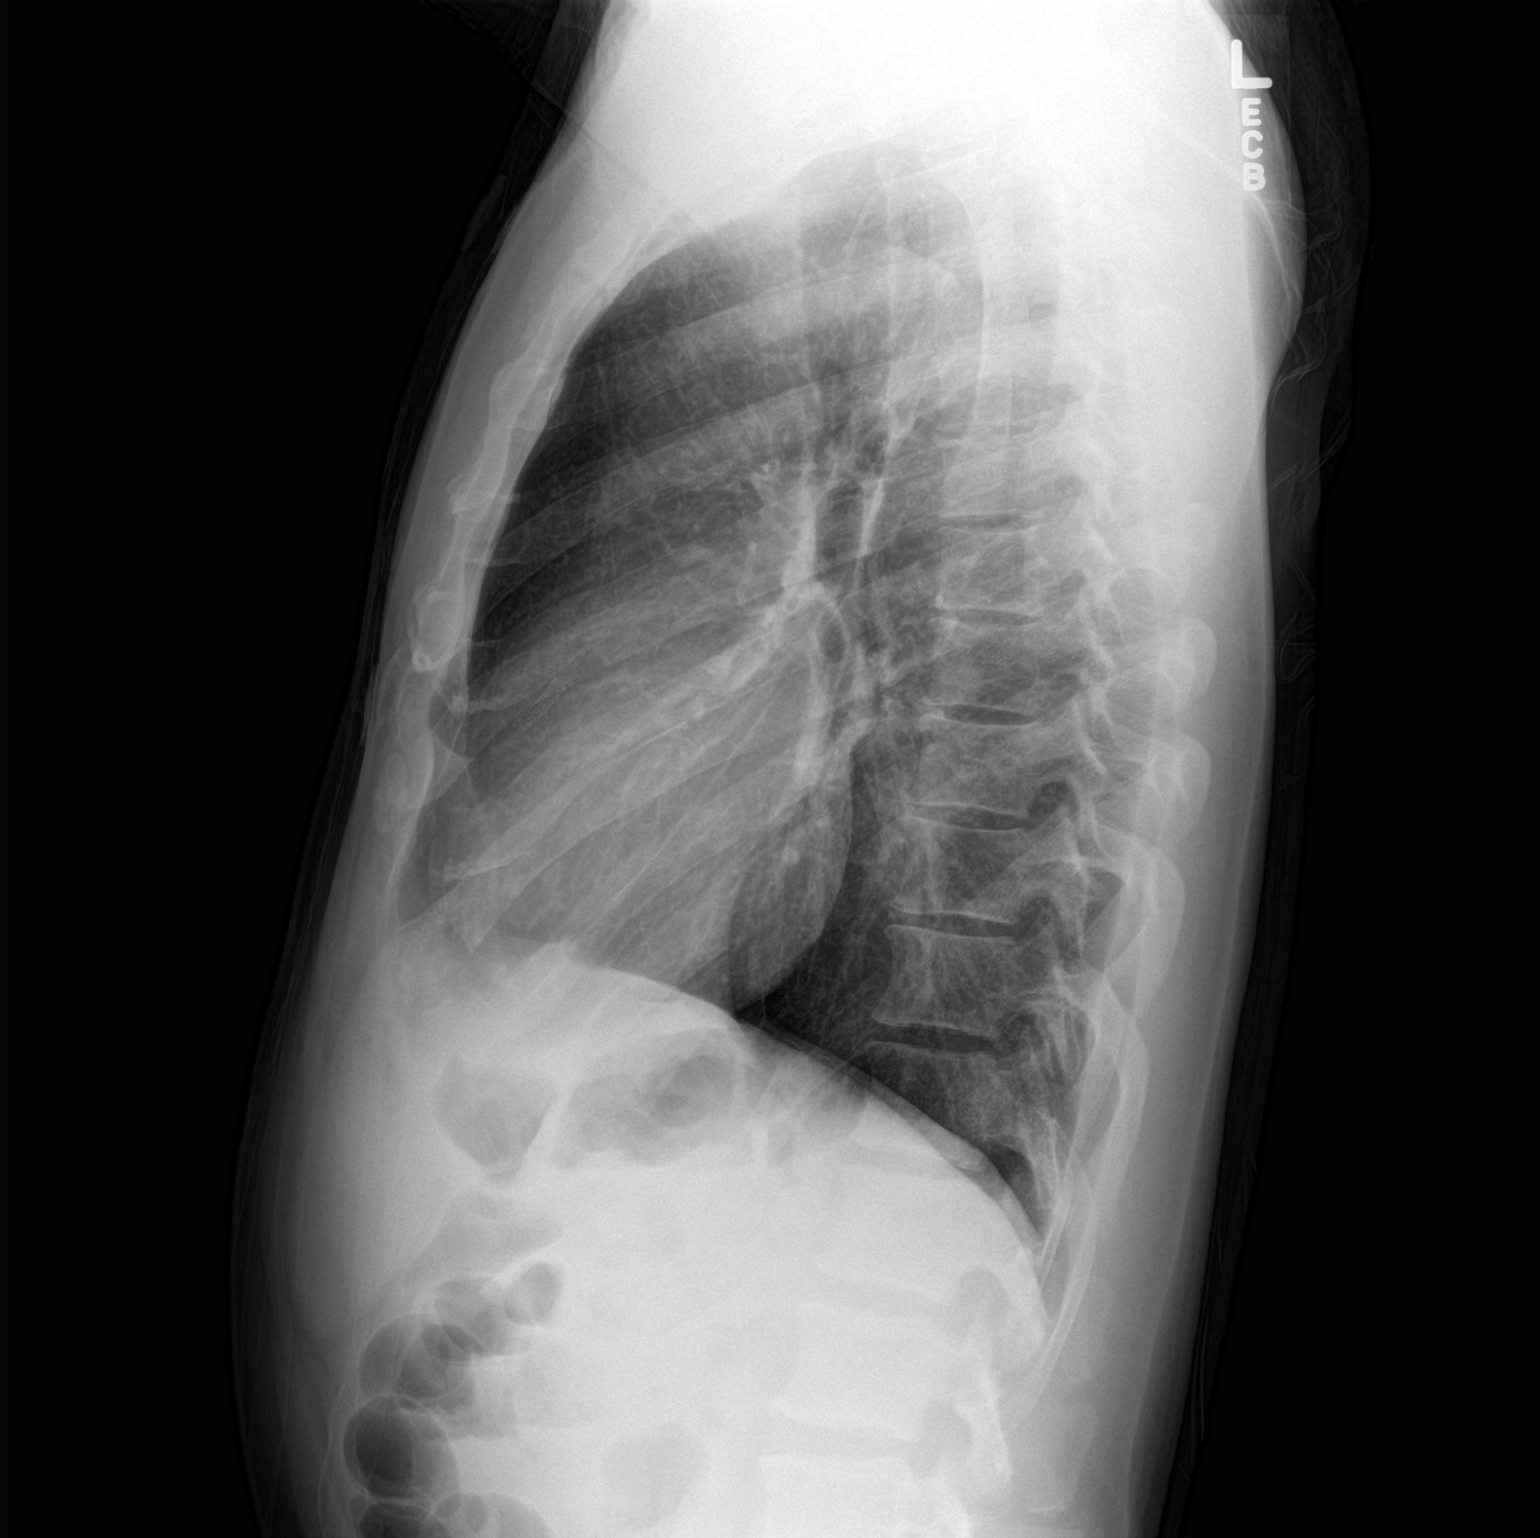

[2 of 2 positions shown; findings below may reference images not displayed]

FINDINGS: The lungs are mildly hyperinflated and clear. The heart and
pulmonary vascularity are normal. The mediastinum is normal in
width. There is no pleural effusion. The bony thorax exhibits no
acute abnormality.
IMPRESSION: Hyperinflation consistent with chronic bronchitic change in the
patient's smoking history. No CHF, pneumonia, nor other acute
cardiopulmonary abnormality.

## 2017-01-22 ENCOUNTER — Ambulatory Visit: Payer: Managed Care, Other (non HMO) | Admitting: Family Medicine

## 2017-01-25 ENCOUNTER — Ambulatory Visit: Payer: Managed Care, Other (non HMO) | Admitting: Family Medicine

## 2017-02-01 ENCOUNTER — Ambulatory Visit: Payer: Managed Care, Other (non HMO) | Admitting: Family Medicine

## 2017-02-01 ENCOUNTER — Ambulatory Visit: Payer: Self-pay | Admitting: *Deleted

## 2017-02-01 DIAGNOSIS — E785 Hyperlipidemia, unspecified: Secondary | ICD-10-CM | POA: Insufficient documentation

## 2017-02-01 DIAGNOSIS — I2510 Atherosclerotic heart disease of native coronary artery without angina pectoris: Secondary | ICD-10-CM

## 2017-02-01 DIAGNOSIS — R079 Chest pain, unspecified: Secondary | ICD-10-CM | POA: Insufficient documentation

## 2017-02-01 DIAGNOSIS — I10 Essential (primary) hypertension: Secondary | ICD-10-CM | POA: Insufficient documentation

## 2017-02-01 HISTORY — DX: Chest pain, unspecified: R07.9

## 2017-02-01 HISTORY — DX: Hyperlipidemia, unspecified: E78.5

## 2017-02-01 HISTORY — DX: Atherosclerotic heart disease of native coronary artery without angina pectoris: I25.10

## 2017-02-01 NOTE — Telephone Encounter (Signed)
Instructed him to call 911 right now.  He said he would right now. He is in [place] working on his job but I told him to call 911 there right now.   He mentioned driving back to [place] but I told don't do that call 911 now.    Reason for Disposition . [1] Chest pain lasts > 5 minutes AND [2] history of heart disease  (i.e., heart attack, bypass surgery, angina, angioplasty, CHF; not just a heart murmur)  Answer Assessment - Initial Assessment Questions 1. LOCATION: "Where does it hurt?"       Chest radiating to left arm with shortness of breath.  "I feel weird"   "I feel like I did when I had a heart attack 2 yrs ago" 2. RADIATION: "Does the pain go anywhere else?" (e.g., into neck, jaw, arms, back)     Left arm 3. 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 4. PATTERN "Does the pain come and go, or has it been constant since it started?"  "Does it get worse with exertion?"      *No Answer* 5. DURATION: "How long does it last" (e.g., seconds, minutes, hours)     *No Answer* 6. SEVERITY: "How bad is the pain?"  (e.g., Scale 1-10; mild, moderate, or severe)    - MILD (1-3): doesn't interfere with normal activities     - MODERATE (4-7): interferes with normal activities or awakens from sleep    - SEVERE (8-10): excruciating pain, unable to do any normal activities       8  [place] same as prior heart attack 7. CARDIAC RISK FACTORS: "Do you have any history of heart problems or risk factors for heart disease?" (e.g., prior heart attack, angina; high blood pressure, diabetes, being overweight, high cholesterol, smoking, or strong family history of heart disease)     MI 2 yrs ago 8. PULMONARY RISK FACTORS: "Do you have any history of lung disease?"  (e.g., blood clots in lung, asthma, emphysema, birth control pills)     *No Answer* 9. CAUSE: "What do you think is causing the  chest pain?"     *No Answer* 10. OTHER SYMPTOMS: "Do you have any other symptoms?" (e.g., dizziness, nausea, vomiting, sweating, fever, difficulty breathing, cough)       *No Answer* 11. PREGNANCY: "Is there any chance you are pregnant?" "When was your last menstrual period?"       *No Answer*  Protocols used: CHEST PAIN-A-AH

## 2017-02-06 NOTE — Progress Notes (Signed)
Chief Complaint  Patient presents with  . hospital f/u    for heart issues, chest pain    HPI  Pt was [facility] chest pain and diaphoresis. He was out of all his medications for about 3 months.  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  He had uncontrolled bp with ranges of 170-190s systolic and was admitted  During his admission he has a stress test that did not show ischemic changes, his echo showed EF of 55% as well as good wall motion Due to his pain he was sent home on a trial of colchicine and indomethacin with carafate He was also started on Imdur with his toprol, hctz, aspirin, protonix, and lipitor He was advised not to resume his lamictal, lexapro, or his trazodone at this time He was referred to follow up with his Cardiologist and PCP.  Today the patient reports that he has been having severe nausea and upset stomach with indomethacin and cannot tolerate it. He pays out of pocket for meds and colchicine is very expensive. He has spent over $260 for his meds to far.  He reports that with activity he gets some chest pressure in the left chest He does not know he is on the meds he is on  He has not smoked since 02/03/2017 after the hospitalization  He denies diaphoresis He has not used his nitro but understands how to take it  Past Medical History:  Diagnosis Date  . Anxiety   . Aortic insufficiency    moderate by echo 04/2014  . Coronary artery disease 03/24/2014   NSTEMI s/p PCI of mid LAD and repeat cath with patent stent  . Depression   . Hypertension   . Tobacco abuse     Current Outpatient Medications  Medication Sig Dispense Refill  .  atorvastatin (LIPITOR) 80 MG tablet Take 1 tablet (80 mg total) by mouth daily at 6 PM. For high cholesterol 90 tablet 3  . colchicine 0.6 MG tablet Take by mouth.    . hydrochlorothiazide (HYDRODIURIL) 25 MG tablet Take 0.5 tablets (12.5 mg total) by mouth daily. 45 tablet 1  . indomethacin (INDOCIN) 25 MG capsule Take by mouth.    . isosorbide mononitrate (IMDUR) 30 MG 24 hr tablet Take 30 mg by mouth daily.    . metoprolol succinate (TOPROL-XL) 25 MG 24 hr tablet Take 25 mg by mouth daily.    . mometasone-formoterol (DULERA) 100-5 MCG/ACT AERO Inhale 2 puffs into the lungs 2 (two) times daily. 13 g 1  . nitroGLYCERIN (NITROSTAT) 0.4 MG SL tablet Place 1 tablet (0.4 mg total) under the tongue every 5 (five) minutes as needed for chest pain. 30 tablet 0  . pantoprazole (PROTONIX) 20 MG tablet Take 1 tablet (20 mg total) by mouth daily. 90 tablet 3  . sucralfate (CARAFATE) 1 g tablet Take 1 g by mouth 4 (four) times daily.    Marland Kitchen. aspirin EC 81 MG tablet Take 81 mg by mouth.     No current facility-administered medications for this visit.     Allergies:  Allergies  Allergen Reactions  .  Hydrocodone Hives    Past Surgical History:  Procedure Laterality Date  . CARDIAC CATHETERIZATION  03/04/2014   PCI of mid LAD  . CARDIAC CATHETERIZATION N/A 11/12/2015   Procedure: Left Heart Cath and Coronary Angiography;  Surgeon: Tonny Bollman, MD;  Location: [facility] INVASIVE CV LAB;  Service: Cardiovascular;  Laterality: N/A;  . LEFT HEART CATHETERIZATION WITH CORONARY ANGIOGRAM Bilateral 03/04/2014   Procedure: LEFT HEART CATHETERIZATION WITH CORONARY ANGIOGRAM;  Surgeon: Kathleene Hazel, MD;  Location: [facility] CATH LAB;  Service: Cardiovascular;  Laterality: Bilateral;  . LEFT HEART CATHETERIZATION WITH CORONARY ANGIOGRAM N/A 03/26/2014   Procedure: LEFT HEART CATHETERIZATION WITH CORONARY ANGIOGRAM;  Surgeon: Lennette Bihari, MD;  Location: [facility] CATH LAB;  Service: Cardiovascular;  Laterality: N/A;     Social History   Socioeconomic History  . Marital status: Legally Separated    Spouse name: None  . Number of children: None  . Years of education: None  . Highest education level: None  Social Needs  . Financial resource strain: None  . Food insecurity - worry: None  . Food insecurity - inability: None  . Transportation needs - medical: None  . Transportation needs - non-medical: None  Occupational History  . None  Tobacco Use  . Smoking status: Former Smoker    Packs/day: 0.50    Years: 30.00    Pack years: 15.00    Types: Cigarettes    Last attempt to quit: 12/29/2015    Years since quitting: 1.1  . Smokeless tobacco: Never Used  Substance and Sexual Activity  . Alcohol use: No    Alcohol/week: 0.0 oz  . Drug use: Yes    Types: Marijuana    Comment: daily use  . Sexual activity: Not Currently    Birth control/protection: Condom  Other Topics Concern  . None  Social History Narrative  . None    Family History  Problem Relation Age of Onset  . Hypertension Mother   . Hypertension Father   . Alcohol abuse Father   . Hypertension Other   . Diabetes Other   . Heart attack Other      ROS Review of Systems See HPI Constitution: No fevers or chills No malaise No diaphoresis Skin: No rash or itching Eyes: no blurry vision, no double vision GU: no dysuria or hematuria Neuro: no dizziness or headaches all others reviewed and negative   Objective: Vitals:   02/07/17 1111  BP: 140/72  Pulse: 67  Resp: 16  Temp: 98.2 F (36.8 C)  TempSrc: Oral  SpO2: 100%  Weight: 160 lb (72.6 kg)  Height: 5\' 7"  (1.702 m)    Physical Exam  Constitutional: He is oriented to person, place, and time. He appears well-developed and well-nourished.  HENT:  Head: Normocephalic and atraumatic.  Eyes: Conjunctivae and EOM are normal.  Cardiovascular: Normal rate, regular rhythm and normal heart sounds.  No murmur heard. Pulmonary/Chest: Effort normal and breath  sounds normal. No stridor. No respiratory distress. He exhibits tenderness. He exhibits no mass, no bony tenderness and no laceration.    Neurological: He is alert and oriented to person, place, and time.  Skin: Skin is warm. Capillary refill takes less than 2 seconds.  Psychiatric: He has a normal mood and affect. His behavior is normal. Judgment and thought content normal.    Assessment and Plan Ulice was seen today for hospital f/u.  Diagnoses and all orders for this visit:  Coronary artery disease due to lipid rich plaque- discussed his  CAD, implications and how to reduce risk factors Patient to follow up with Cardiology  Angina pectoris [facility](HCC)- improving but not resolved. Advised to continue the colchicine and see cardiology  Essential hypertension- bp improved with compliance to meds Discussed ways to get meds cheaper  Tobacco abuse- supported pt on smoking cessation and discussed ways to abstain  Encounter for medication refill  [facility]  Hospital discharge follow-up Reconciled medications today -     mometasone-formoterol (DULERA) 100-5 MCG/ACT AERO; Inhale 2 puffs into the lungs 2 (two) times daily.     Darren [name]

## 2017-02-07 ENCOUNTER — Other Ambulatory Visit: Payer: Self-pay

## 2017-02-07 ENCOUNTER — Ambulatory Visit (INDEPENDENT_AMBULATORY_CARE_PROVIDER_SITE_OTHER): Payer: Self-pay | Admitting: Family Medicine

## 2017-02-07 ENCOUNTER — Encounter: Payer: Self-pay | Admitting: Family Medicine

## 2017-02-07 VITALS — BP 140/72 | HR 67 | Temp 98.2°F | Resp 16 | Ht 67.0 in | Wt 160.0 lb

## 2017-02-07 DIAGNOSIS — I209 Angina pectoris, unspecified: Secondary | ICD-10-CM

## 2017-02-07 DIAGNOSIS — Z760 Encounter for issue of repeat prescription: Secondary | ICD-10-CM

## 2017-02-07 DIAGNOSIS — I2510 Atherosclerotic heart disease of native coronary artery without angina pectoris: Secondary | ICD-10-CM

## 2017-02-07 DIAGNOSIS — I10 Essential (primary) hypertension: Secondary | ICD-10-CM

## 2017-02-07 DIAGNOSIS — Z720 Tobacco use: Secondary | ICD-10-CM

## 2017-02-07 DIAGNOSIS — Z09 Encounter for follow-up examination after completed treatment for conditions other than malignant neoplasm: Secondary | ICD-10-CM

## 2017-02-07 DIAGNOSIS — I2583 Coronary atherosclerosis due to lipid rich plaque: Secondary | ICD-10-CM

## 2017-02-07 MED ORDER — MOMETASONE FURO-FORMOTEROL FUM 100-5 MCG/ACT IN AERO: 2.0000 | INHALATION_SPRAY | Freq: Two times a day (BID) | RESPIRATORY_TRACT | 1 refills | Status: DC

## 2017-02-07 NOTE — Patient Instructions (Addendum)
Dr. Cornelious Bryantraci R. Mayford Knifeurner, MD  Address: [address], [place], [place] [address]  Phone: [phone]    IF you received an x-ray today, you will receive an invoice from [facility]. Please contact [facility] at [phone] with questions or concerns regarding your invoice.   IF you received labwork today, you will receive an invoice from [facility]. Please contact [facility] at [phone] with questions or concerns regarding your invoice.   Our billing staff will not be able to assist you with questions regarding bills from these companies.  You will be contacted with the lab results as soon as they are available. The fastest way to get your results is to activate your My Chart account. Instructions are located on the last page of this paperwork. If you have not heard from us regarding the results in 2 weeks, please contact this office.      Prediabetes Prediabetes is the condition of having a blood sugar (blood glucose) level that is higher than it should be, but not high enough for you to be diagnosed with type 2 diabetes. Having prediabetes puts you at risk for developing type 2 diabetes (type 2 diabetes mellitus). Prediabetes may be called impaired glucose tolerance or impaired fasting glucose. Prediabetes usually does not cause symptoms. Your health care provider can diagnose this condition with blood tests. You may be tested for prediabetes if you are overweight and if you have at least one other risk factor for prediabetes. Risk factors for prediabetes include:  Having a family member with type 2 diabetes.  Being overweight or obese.  Being older than age 47.  Being of American-Indian, African-American, Hispanic/Latino, or Asian/Pacific Islander descent.  Having an inactive (sedentary) lifestyle.  Having a history of gestational diabetes or polycystic ovarian syndrome (PCOS).  Having low levels of good cholesterol (HDL-C) or high levels of blood fats  (triglycerides).  Having high blood pressure.  What is blood glucose and how is blood glucose measured?  Blood glucose refers to the amount of glucose in your bloodstream. Glucose comes from eating foods that contain sugars and starches (carbohydrates) that the body breaks down into glucose. Your blood glucose level may be measured in mg/dL (milligrams per deciliter) or mmol/L (millimoles per liter).Your blood glucose may be checked with one or more of the following blood tests:  A fasting blood glucose (FBG) test. You will not be allowed to eat (you will fast) for at least 8 hours before a blood sample is taken. ? A normal range for FBG is 70-100 mg/dl (6.2-9.53.9-5.6 mmol/L).  An A1c (hemoglobin A1c) blood test. This test provides information about blood glucose control over the previous 2?3months.  An oral glucose tolerance test (OGTT). This test measures your blood glucose twice: ? After fasting. This is your baseline level. ? Two hours after you drink a beverage that contains glucose.  You may be diagnosed with prediabetes:  If your FBG is 100?125 mg/dL (2.8-4.15.6-6.9 mmol/L).  If your A1c level is 5.7?6.4%.  If your OGGT result is 140?199 mg/dL (3.2-447.8-11 mmol/L).  These blood tests may be repeated to confirm your diagnosis. What happens if blood glucose is too high? The pancreas produces a hormone (insulin) that helps move glucose from the bloodstream into cells. When cells in the body do not respond properly to insulin that the body makes (insulin resistance), excess glucose builds up in the blood instead of going into cells. As a result, high blood glucose (hyperglycemia) can develop, which can cause many complications.  This is a symptom of prediabetes. What can happen if blood glucose stays higher than normal for a long time? Having high blood glucose for a long time is dangerous. Too much glucose in your blood can damage your nerves and blood vessels. Long-term damage can lead to  complications from diabetes, which may include:  Heart disease.  Stroke.  Blindness.  Kidney disease.  Depression.  Poor circulation in the feet and legs, which could lead to surgical removal (amputation) in severe cases.  How can prediabetes be prevented from turning into type 2 diabetes?  To help prevent type 2 diabetes, take the following actions:  Be physically active. ? Do moderate-intensity physical activity for at least 30 minutes on at least 5 days of the week, or as much as told by your health care provider. This could be brisk walking, biking, or water aerobics. ? Ask your health care provider what activities are safe for you. A mix of physical activities may be best, such as walking, swimming, cycling, and strength training.  Lose weight as told by your health care provider. ? Losing 5-7% of your body weight can reverse insulin resistance. ? Your health care provider can determine how much weight loss is best for you and can help you lose weight safely.  Follow a healthy meal plan. This includes eating lean proteins, complex carbohydrates, fresh fruits and vegetables, low-fat dairy products, and healthy fats. ? Follow instructions from your health care provider about eating or drinking restrictions. ? Make an appointment to see a diet and nutrition specialist (registered dietitian) to help you create a healthy eating plan that is right for you.  Do not smoke or use any tobacco products, such as cigarettes, chewing tobacco, and e-cigarettes. If you need help quitting, ask your health care provider.  Take over-the-counter and prescription medicines as told by your health care provider. You may be prescribed medicines that help lower the risk of type 2 diabetes.  This information is not intended to replace advice given to you by your health care provider. Make sure you discuss any questions you have with your health care provider. Document Released: 06/07/2015 Document  Revised: 07/22/2015 Document Reviewed: 04/06/2015 Elsevier Interactive Patient Education  [employer].

## 2017-02-08 ENCOUNTER — Encounter: Payer: Self-pay | Admitting: Physician Assistant

## 2017-02-08 ENCOUNTER — Ambulatory Visit (INDEPENDENT_AMBULATORY_CARE_PROVIDER_SITE_OTHER): Payer: Self-pay | Admitting: Physician Assistant

## 2017-02-08 VITALS — BP 158/70 | HR 70 | Ht 67.0 in | Wt 160.0 lb

## 2017-02-08 DIAGNOSIS — Z720 Tobacco use: Secondary | ICD-10-CM

## 2017-02-08 DIAGNOSIS — I351 Nonrheumatic aortic (valve) insufficiency: Secondary | ICD-10-CM

## 2017-02-08 DIAGNOSIS — I2510 Atherosclerotic heart disease of native coronary artery without angina pectoris: Secondary | ICD-10-CM

## 2017-02-08 DIAGNOSIS — I2583 Coronary atherosclerosis due to lipid rich plaque: Secondary | ICD-10-CM

## 2017-02-08 DIAGNOSIS — R079 Chest pain, unspecified: Secondary | ICD-10-CM

## 2017-02-08 DIAGNOSIS — I10 Essential (primary) hypertension: Secondary | ICD-10-CM

## 2017-02-08 NOTE — Patient Instructions (Addendum)
Medication Instructions:  Your physician recommends that you continue on your current medications as directed. Please refer to the Current Medication list given to you today.   Labwork: None ordered  Testing/Procedures: None ordered  Follow-Up: Your physician recommends that you schedule a follow-up appointment in: February 2019 WITH DR. Mayford KnifeURNER   Any Other Special Instructions Will Be Listed Below (If Applicable). TAKE IBUPROFEN OR INDOCIN FOR YOUR CHEST PAIN    If you need a refill on your cardiac medications before your next appointment, please call your pharmacy.

## 2017-02-08 NOTE — Progress Notes (Signed)
Cardiology Office Note    Date:  02/08/2017   ID:  Darren [name], DOB Dec 11, 1970, MRN [id number]  PCP:  Doristine Bosworth, MD  Cardiologist: Dr. Mayford Knife  Chief Complaint  Patient presents with  . Follow-up    History of Present Illness:  Darren [name] is a 46 y.o. male with history of CAD status post NSTEMI treated with DES to the LAD 02/2014, hypertension, tobacco abuse.  Repeat cardiac catheterization 11/12/15 no restenosis of the stent and normal LV function on echo.  Patient went to [facility] 02/01/17 with chest pain and shortness of breath that lasted 2 hours.  Telemetry showed bradycardia down to 43 at night, troponins negative x3 potassium normal creatinine normal.  Nuclear stress test 02/02/17 was normal EF 55%.  2D echo 02/02/17 normal LVEF 55-60% moderate AI.  He was treated with indomethacin and colchicine.  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  Patient says he still has occasional chest pain.  He's still smoking but says he is going to quit.  He also complains of sweating at night.   Past Medical History:  Diagnosis Date  . Anxiety   . Aortic insufficiency    moderate by echo 04/2014  . Coronary artery disease 03/24/2014   NSTEMI s/p PCI of mid LAD and repeat cath with patent stent  . Depression   . Hypertension   . Tobacco abuse     Past Surgical History:  Procedure Laterality Date  . CARDIAC CATHETERIZATION  03/04/2014   PCI of mid LAD  . CARDIAC CATHETERIZATION N/A 11/12/2015   Procedure: Left Heart Cath and Coronary Angiography;  Surgeon: Tonny Bollman, MD;  Location: [facility] INVASIVE CV LAB;  Service: Cardiovascular;  Laterality: N/A;  . LEFT HEART CATHETERIZATION WITH CORONARY ANGIOGRAM Bilateral 03/04/2014   Procedure: LEFT HEART CATHETERIZATION WITH CORONARY ANGIOGRAM;  Surgeon: Kathleene Hazel, MD;  Location: [facility] CATH LAB;  Service: Cardiovascular;  Laterality: Bilateral;  . LEFT HEART CATHETERIZATION WITH CORONARY ANGIOGRAM  N/A 03/26/2014   Procedure: LEFT HEART CATHETERIZATION WITH CORONARY ANGIOGRAM;  Surgeon: Lennette Bihari, MD;  Location: [facility] CATH LAB;  Service: Cardiovascular;  Laterality: N/A;    Current Medications: Current Meds  Medication Sig  . aspirin EC 81 MG tablet Take 81 mg by mouth.  Marland Kitchen atorvastatin (LIPITOR) 80 MG tablet Take 1 tablet (80 mg total) by mouth daily at 6 PM. For high cholesterol  . hydrochlorothiazide (HYDRODIURIL) 25 MG tablet Take 0.5 tablets (12.5 mg total) by mouth daily.  . isosorbide mononitrate (IMDUR) 30 MG 24 hr tablet Take 30 mg by mouth daily.  . metoprolol succinate (TOPROL-XL) 25 MG 24 hr tablet Take 25 mg by mouth daily.  . mometasone-formoterol (DULERA) 100-5 MCG/ACT AERO Inhale 2 puffs into the lungs 2 (two) times daily.  . nitroGLYCERIN (NITROSTAT) 0.4 MG SL tablet Place 1 tablet (0.4 mg total) under the tongue every 5 (five) minutes as needed for chest pain.  . pantoprazole (PROTONIX) 20 MG tablet Take 1 tablet (20 mg total) by mouth daily.  . sucralfate (CARAFATE) 1 g tablet Take 1 g by mouth 4 (four) times daily.     Allergies:   Hydrocodone   Social History   Socioeconomic History  . Marital status: Legally Separated    Spouse name: None  . Number of children: None  . Years of education: None  . Highest education level: None  Social Needs  . Financial resource strain: None  . Food insecurity -  worry: None  . Food insecurity - inability: None  . Transportation needs - medical: None  . Transportation needs - non-medical: None  Occupational History  . None  Tobacco Use  . Smoking status: Former Smoker    Packs/day: 0.50    Years: 30.00    Pack years: 15.00    Types: Cigarettes    Last attempt to quit: 12/29/2015    Years since quitting: 1.1  . Smokeless tobacco: Never Used  Substance and Sexual Activity  . Alcohol use: No    Alcohol/week: 0.0 oz  . Drug use: Yes    Types: Marijuana    Comment: daily use  . Sexual activity: Not Currently     Birth control/protection: Condom  Other Topics Concern  . None  Social History Narrative  . None     Family History:  The patient's family history includes Alcohol abuse in his father; Diabetes in his other; Heart attack in his other; Hypertension in his father, mother, and other.   ROS:   Please see the history of present illness.    Review of Systems  Cardiovascular: Positive for chest pain.   All other systems reviewed and are negative.   PHYSICAL EXAM:   VS:  BP (!) 158/70   Pulse 70   Ht 5\' 7"  (1.702 m)   Wt 160 lb (72.6 kg)   SpO2 98%   BMI 25.06 kg/m   Physical Exam  GEN: Well nourished, well developed, in no acute distress  Neck: no JVD, carotid bruits, or masses Cardiac: Left chest is tender to touch and reproduces his pain RRR; no murmurs, rubs, or gallops  Respiratory:  clear to auscultation bilaterally, normal work of breathing GI: soft, nontender, nondistended, + BS Ext: without cyanosis, clubbing, or edema, Good distal pulses bilaterally Neuro:  Alert and Oriented x 3 Psych: euthymic mood, full affect  Wt Readings from Last 3 Encounters:  02/08/17 160 lb (72.6 kg)  02/07/17 160 lb (72.6 kg)  04/07/16 160 lb (72.6 kg)      Studies/Labs Reviewed:   EKG:  EKG is  ordered today.  EKG shows sinus bradycardia at 58 bpm with LVH and repolarization changes, no acute change Recent Labs: 04/07/2016: BUN 13; Creatinine, Ser 1.25; Potassium 4.3; Sodium 136   Lipid Panel    Component Value Date/Time   CHOL 127 11/12/2015 0942   TRIG 55 11/12/2015 0942   HDL 42 11/12/2015 0942   CHOLHDL 3.0 11/12/2015 0942   VLDL 11 11/12/2015 0942   LDLCALC 74 11/12/2015 0942    Additional studies/ records that were reviewed today include:   Nuc Stress test 02/02/2017 [facility] Summary:  1. Perfusion at stress and rest conditions was normal. 2. LV EF was 55%. 3. No change in abnormal baseline ECG or 4/10 chest pain with stress.  Echocardiogram 02/02/2017 Summary:  1.  Left ventricle septal thickness is mildly increased. 2. The left ventricle mass index and the relative wall thickness values indicate concentric hypertrophy. 3. The EF is estimated at 55-60%. 4. Left ventricular diastolic function is indeterminate on current study. 5. Moderate aortic regurgitation is present. 6. There is trivial aortic valve stenosis present.   There trivial gradient may be related to increased flow from the aortic regurgitation.  There appears to be adequate leaflet excursion. 7. There is a trivial amount of mitral regurgitation. 8. There is trivial tricuspid regurgitation. 9. There is a trivial amount of pulmonic regurgitation. 10. The right ventricular systolic pressure could not be  calculated. 11. No pulmonary hypertension is noted. 12. There is no dilatation of the ascending aorta. The ascending aorta is not well visualized above the sinus of valsava.     ASSESSMENT:    1. Chest pain in adult   2. Coronary artery disease due to lipid rich plaque   3. Essential hypertension   4. Aortic valve insufficiency, etiology of cardiac valve disease unspecified   5. Tobacco abuse      PLAN:  In order of problems listed above:  Chest pain reproducible to palpation consistent with musculoskeletal chest pain.  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  Recommend ibuprofen or indomethacin that was [facility].  Follow-up with Dr. Mayford Knifeurner in February  CAD status post NSTEMI treated with DES to the LAD 02/2014, no restenosis on repeat cath 2017, normal nuclear stress test 02/02/17 [facility].  Essential hypertension patient's blood pressure is up a little bit today.  He is anxious.  No changes today  Moderate aortic regurgitation [facility].  Need to follow routinely with 2D echo's  Tobacco abuse patient continues to smoke but says she is going to quit.  The importance of smoking cessation discussed with  patient.    Medication Adjustments/Labs and Tests Ordered: Current medicines are reviewed at length with the patient today.  Concerns regarding medicines are outlined above.  Medication changes, Labs and Tests ordered today are listed in the Patient Instructions below. Patient Instructions  Medication Instructions:  Your physician recommends that you continue on your current medications as directed. Please refer to the Current Medication list given to you today.   Labwork: None ordered  Testing/Procedures: None ordered  Follow-Up: Your physician recommends that you schedule a follow-up appointment in: February 2019 WITH DR. Mayford KnifeURNER   Any Other Special Instructions Will Be Listed Below (If Applicable). TAKE IBUPROFEN OR INDOCIN FOR YOUR CHEST PAIN    If you need a refill on your cardiac medications before your next appointment, please call your pharmacy.      Signed, Jacolyn ReedyMichele Lenze, PA-C  02/08/2017 1:49 PM    [facility] [address], [place], [place]  [address] Phone: [phone]; Fax: [phone]

## 2017-04-09 ENCOUNTER — Ambulatory Visit: Payer: Managed Care, Other (non HMO) | Admitting: Family Medicine

## 2017-04-12 ENCOUNTER — Encounter: Payer: Self-pay | Admitting: Cardiology

## 2017-04-19 ENCOUNTER — Ambulatory Visit: Payer: Self-pay | Admitting: Cardiology

## 2017-04-20 ENCOUNTER — Encounter: Payer: Self-pay | Admitting: Cardiology

## 2017-07-02 ENCOUNTER — Emergency Department (HOSPITAL_COMMUNITY)
Admission: EM | Admit: 2017-07-02 | Discharge: 2017-07-02 | Disposition: A | Discharge status: Home / Self Care | Payer: Self-pay | Attending: Emergency Medicine | Admitting: Emergency Medicine

## 2017-07-02 ENCOUNTER — Encounter (HOSPITAL_COMMUNITY): Payer: Self-pay | Admitting: Emergency Medicine

## 2017-07-02 DIAGNOSIS — I10 Essential (primary) hypertension: Secondary | ICD-10-CM | POA: Insufficient documentation

## 2017-07-02 DIAGNOSIS — M722 Plantar fascial fibromatosis: Secondary | ICD-10-CM | POA: Insufficient documentation

## 2017-07-02 DIAGNOSIS — M5431 Sciatica, right side: Secondary | ICD-10-CM | POA: Insufficient documentation

## 2017-07-02 DIAGNOSIS — I2510 Atherosclerotic heart disease of native coronary artery without angina pectoris: Secondary | ICD-10-CM | POA: Insufficient documentation

## 2017-07-02 DIAGNOSIS — Z7982 Long term (current) use of aspirin: Secondary | ICD-10-CM | POA: Insufficient documentation

## 2017-07-02 DIAGNOSIS — Z87891 Personal history of nicotine dependence: Secondary | ICD-10-CM | POA: Insufficient documentation

## 2017-07-02 DIAGNOSIS — Z79899 Other long term (current) drug therapy: Secondary | ICD-10-CM | POA: Insufficient documentation

## 2017-07-02 LAB — CBG MONITORING, ED: GLUCOSE-CAPILLARY: 88 mg/dL (ref 65–99)

## 2017-07-02 MED ORDER — CYCLOBENZAPRINE HCL 10 MG PO TABS
10.0000 mg | ORAL_TABLET | Freq: Once | ORAL | Status: AC
  Administered 2017-07-02: 10 mg via ORAL
  Filled 2017-07-02: qty 1

## 2017-07-02 MED ORDER — OXYCODONE-ACETAMINOPHEN 5-325 MG PO TABS
1.0000 | ORAL_TABLET | Freq: Once | ORAL | Status: AC
  Administered 2017-07-02: 1 via ORAL
  Filled 2017-07-02: qty 1

## 2017-07-02 MED ORDER — METHOCARBAMOL 500 MG PO TABS: 500.0000 mg | ORAL_TABLET | Freq: Two times a day (BID) | ORAL | 0 refills | Status: DC

## 2017-07-02 MED ORDER — TRAMADOL HCL 50 MG PO TABS: 50.0000 mg | ORAL_TABLET | Freq: Four times a day (QID) | ORAL | 0 refills | Status: DC | PRN

## 2017-07-02 MED ORDER — PREDNISONE 50 MG PO TABS: ORAL_TABLET | ORAL | 0 refills | Status: DC

## 2017-07-02 NOTE — Discharge Instructions (Signed)
Do not drive while taking the muscle relaxer or narcotic because they will make you sleepy. Follow up with your doctor as soon as possible.

## 2017-07-02 NOTE — ED Triage Notes (Signed)
Per pt, states lower back pain since Saturday-states also having B/L foot pain, states on the sides of his feet-states feet started hurting first and then his back-no relief with OTC meds

## 2017-07-02 NOTE — ED Provider Notes (Signed)
[facility]-EMERGENCY DEPT Provider Note   CSN: [id number] Arrival date & time: 07/02/17  4098     History   Chief Complaint Chief Complaint  Patient presents with  . Back Pain  . Foot Pain    [name] is a 47 y.o. male with hx of HTN, prediabetes, CAD, depression and every day smoker who presents to the ED with bilateral foot pain and low back pain that started 3 days ago. Patient reports that his feet hurt on the sides when he is standing. He reports that the pain in the feet started first and he has had problems off and on for a while with his feet. . Patient does landscaping for a living and states she is lifting, bending and turning all the time.  Patient was also driving for an hour to see his daughter before the back pain started. Patient reports taking ibuprofen without relief.   The history is provided by the patient. No language interpreter was used.  Back Pain   The current episode started more than 2 days ago. The problem occurs constantly. The problem has been gradually worsening. The pain is associated with no known injury. The pain is present in the lumbar spine. The pain is at a severity of 10/10. The symptoms are aggravated by bending and twisting. The pain is the same all the time. Pertinent negatives include no chest pain, no fever, no abdominal pain, no bowel incontinence, no bladder incontinence, no dysuria and no weakness. He has tried NSAIDs for the symptoms.  Foot Pain  Pertinent negatives include no chest pain, no abdominal pain and no shortness of breath.    Past Medical History:  Diagnosis Date  . Anxiety   . Aortic insufficiency    moderate by echo 04/2014  . Coronary artery disease 03/24/2014   NSTEMI s/p PCI of mid LAD and repeat cath with patent stent  . Depression   . Hypertension   . Tobacco abuse     Patient Active Problem List   Diagnosis Date Noted  . Dyslipidemia 02/01/2017  . Coronary artery disease involving  native heart 02/01/2017  . Chest pain in adult 02/01/2017  . Hypertension 02/01/2017  . Pulmonary emphysema (HCC) 03/24/2016  . Angina pectoris (HCC) 11/11/2015  . Aortic insufficiency   . MDD (major depressive disorder) 08/06/2014  . ETOH abuse 08/06/2014  . Depression   . Coronary artery disease due to lipid rich plaque   . Essential hypertension 03/04/2014  . Tobacco abuse 03/04/2014    Past Surgical History:  Procedure Laterality Date  . CARDIAC CATHETERIZATION  03/04/2014   PCI of mid LAD  . CARDIAC CATHETERIZATION N/A 11/12/2015   Procedure: Left Heart Cath and Coronary Angiography;  Surgeon: Tonny Bollman, MD;  Location: [facility] INVASIVE CV LAB;  Service: Cardiovascular;  Laterality: N/A;  . LEFT HEART CATHETERIZATION WITH CORONARY ANGIOGRAM Bilateral 03/04/2014   Procedure: LEFT HEART CATHETERIZATION WITH CORONARY ANGIOGRAM;  Surgeon: Kathleene Hazel, MD;  Location: [facility] CATH LAB;  Service: Cardiovascular;  Laterality: Bilateral;  . LEFT HEART CATHETERIZATION WITH CORONARY ANGIOGRAM N/A 03/26/2014   Procedure: LEFT HEART CATHETERIZATION WITH CORONARY ANGIOGRAM;  Surgeon: Lennette Bihari, MD;  Location: [facility] CATH LAB;  Service: Cardiovascular;  Laterality: N/A;        Home Medications    Prior to Admission medications   Medication Sig Start Date End Date Taking? Authorizing Provider  aspirin EC 81 MG tablet Take 81 mg by mouth.  [provider]  atorvastatin (LIPITOR) 80 MG tablet Take 1 tablet (80 mg total) by mouth daily at 6 PM. For high cholesterol 04/07/16   Collie Siad A, MD  hydrochlorothiazide (HYDRODIURIL) 25 MG tablet Take 0.5 tablets (12.5 mg total) by mouth daily. 04/07/16   Doristine Bosworth, MD  isosorbide mononitrate (IMDUR) 30 MG 24 hr tablet Take 30 mg by mouth daily. 02/04/17 02/04/18  [provider]  methocarbamol (ROBAXIN) 500 MG tablet Take 1 tablet (500 mg total) by mouth 2 (two) times daily. 07/02/17   Janne Napoleon, NP  metoprolol succinate  (TOPROL-XL) 25 MG 24 hr tablet Take 25 mg by mouth daily. 02/03/17 02/03/18  [provider]  mometasone-formoterol (DULERA) 100-5 MCG/ACT AERO Inhale 2 puffs into the lungs 2 (two) times daily. 02/07/17 02/07/18  Doristine Bosworth, MD  nitroGLYCERIN (NITROSTAT) 0.4 MG SL tablet Place 1 tablet (0.4 mg total) under the tongue every 5 (five) minutes as needed for chest pain. 03/24/16   Doristine Bosworth, MD  pantoprazole (PROTONIX) 20 MG tablet Take 1 tablet (20 mg total) by mouth daily. 04/07/16   Doristine Bosworth, MD  predniSONE (DELTASONE) 50 MG tablet Take one tablet PO daily for inflammation. 07/02/17   Janne Napoleon, NP  sucralfate (CARAFATE) 1 g tablet Take 1 g by mouth 4 (four) times daily. 02/03/17 02/03/18  [provider]  traMADol (ULTRAM) 50 MG tablet Take 1 tablet (50 mg total) by mouth every 6 (six) hours as needed. 07/02/17   Janne Napoleon, NP    Family History Family History  Problem Relation Age of Onset  . Hypertension Mother   . Hypertension Father   . Alcohol abuse Father   . Hypertension Other   . Diabetes Other   . Heart attack Other     Social History Social History   Tobacco Use  . Smoking status: Former Smoker    Packs/day: 0.50    Years: 30.00    Pack years: 15.00    Types: Cigarettes    Last attempt to quit: 12/29/2015    Years since quitting: 1.5  . Smokeless tobacco: Never Used  Substance Use Topics  . Alcohol use: No    Alcohol/week: 0.0 oz  . Drug use: Yes    Types: Marijuana    Comment: daily use     Allergies   Hydrocodone   Review of Systems Review of Systems  Constitutional: Negative for fever.  HENT: Negative.   Eyes: Negative for visual disturbance.  Respiratory: Negative for shortness of breath.   Cardiovascular: Negative for chest pain.  Gastrointestinal: Negative for abdominal pain, bowel incontinence and vomiting.  Genitourinary: Negative for bladder incontinence and dysuria.  Musculoskeletal: Positive for arthralgias  and back pain.       Bilateral foot pain  Skin: Negative for rash and wound.  Neurological: Negative for syncope and weakness.  Psychiatric/Behavioral: Negative for confusion.     Physical Exam Updated Vital Signs BP (!) 162/92 (BP Location: Left Arm)   Pulse 60   Temp 98.2 F (36.8 C) (Oral)   Resp 18   Ht  (1.702 m)   Wt 72.6 kg (160 lb)   SpO2 100%   BMI 25.06 kg/m   Physical Exam  Constitutional: He appears well-developed and well-nourished. No distress.  HENT:  Head: Normocephalic and atraumatic.  Eyes: Pupils are equal, round, and reactive to light. Conjunctivae and EOM are normal.  Neck: Normal range of motion. Neck supple.  Cardiovascular: Normal rate, regular rhythm and intact distal pulses.  Pulmonary/Chest: Effort normal and breath sounds normal.  Abdominal: Soft. Bowel sounds are normal. There is no tenderness. There is no CVA tenderness.  Musculoskeletal:       Lumbar back: He exhibits tenderness and spasm. He exhibits no deformity and no laceration. Decreased range of motion: due to pain.       Back:  Pain radiates to the right buttock  Neurological: He is alert. He has normal strength. No sensory deficit.  Reflex Scores:      Bicep reflexes are 2+ on the right side and 2+ on the left side.      Brachioradialis reflexes are 2+ on the right side and 2+ on the left side.      Patellar reflexes are 2+ on the right side and 2+ on the left side. Grips are equal, radial and pedal pulses 2+, pain with straight leg raises right >left.   Skin: Skin is warm and dry.  Psychiatric: He has a normal mood and affect. His behavior is normal.  Nursing note and vitals reviewed.    ED Treatments / Results  Labs (all labs ordered are listed, but only abnormal results are displayed) Labs Reviewed  CBG MONITORING, ED   Radiology No results found.  Procedures Procedures (including critical care time)  Medications Ordered in ED Medications    oxyCODONE-acetaminophen (PERCOCET/ROXICET) 5-325 MG per tablet 1 tablet (1 tablet Oral Given 07/02/17 1329)  cyclobenzaprine (FLEXERIL) tablet 10 mg (10 mg Oral Given 07/02/17 1329)   2:00 pm pain improved with treatment in the ED will d/c home with medication for muscle spasm and inflammation and pain. Patient to f/u with PCP. Return precautions discussed. Discussed medication can make patient sleepy.   Initial Impression / Assessment and Plan / ED Course  I have reviewed the triage vital signs and the nursing notes.  Final Clinical Impressions(s) / ED Diagnoses   Final diagnoses:  Sciatica, right side  Plantar fasciitis    ED Discharge Orders        Ordered    methocarbamol (ROBAXIN) 500 MG tablet  2 times daily     07/02/17 1404    predniSONE (DELTASONE) 50 MG tablet     07/02/17 1404    traMADol (ULTRAM) 50 MG tablet  Every 6 hours PRN     07/02/17 1406       Damian Leavell [place], NP 07/02/17 1407    Benjiman Core, MD 07/02/17 [id number]

## 2017-09-01 ENCOUNTER — Other Ambulatory Visit: Payer: Self-pay

## 2017-09-01 ENCOUNTER — Ambulatory Visit (INDEPENDENT_AMBULATORY_CARE_PROVIDER_SITE_OTHER): Payer: BLUE CROSS/BLUE SHIELD | Admitting: Family Medicine

## 2017-09-01 VITALS — BP 169/80 | HR 53 | Temp 98.1°F | Resp 16 | Ht 67.0 in | Wt 160.0 lb

## 2017-09-01 DIAGNOSIS — I10 Essential (primary) hypertension: Secondary | ICD-10-CM | POA: Diagnosis not present

## 2017-09-01 DIAGNOSIS — J439 Emphysema, unspecified: Secondary | ICD-10-CM | POA: Diagnosis not present

## 2017-09-01 DIAGNOSIS — F419 Anxiety disorder, unspecified: Secondary | ICD-10-CM | POA: Diagnosis not present

## 2017-09-01 DIAGNOSIS — F32A Depression, unspecified: Secondary | ICD-10-CM

## 2017-09-01 DIAGNOSIS — Z125 Encounter for screening for malignant neoplasm of prostate: Secondary | ICD-10-CM | POA: Diagnosis not present

## 2017-09-01 DIAGNOSIS — I2510 Atherosclerotic heart disease of native coronary artery without angina pectoris: Secondary | ICD-10-CM | POA: Diagnosis not present

## 2017-09-01 DIAGNOSIS — F331 Major depressive disorder, recurrent, moderate: Secondary | ICD-10-CM

## 2017-09-01 DIAGNOSIS — Z131 Encounter for screening for diabetes mellitus: Secondary | ICD-10-CM

## 2017-09-01 DIAGNOSIS — F329 Major depressive disorder, single episode, unspecified: Secondary | ICD-10-CM

## 2017-09-01 DIAGNOSIS — R7303 Prediabetes: Secondary | ICD-10-CM | POA: Diagnosis not present

## 2017-09-01 DIAGNOSIS — I2583 Coronary atherosclerosis due to lipid rich plaque: Secondary | ICD-10-CM

## 2017-09-01 DIAGNOSIS — F1021 Alcohol dependence, in remission: Secondary | ICD-10-CM | POA: Insufficient documentation

## 2017-09-01 DIAGNOSIS — Z23 Encounter for immunization: Secondary | ICD-10-CM

## 2017-09-01 DIAGNOSIS — Z0000 Encounter for general adult medical examination without abnormal findings: Secondary | ICD-10-CM

## 2017-09-01 HISTORY — DX: Major depressive disorder, recurrent, moderate: F33.1

## 2017-09-01 MED ORDER — NITROGLYCERIN 0.4 MG SL SUBL: 0.4000 mg | SUBLINGUAL_TABLET | SUBLINGUAL | 0 refills | Status: AC | PRN

## 2017-09-01 MED ORDER — HYDROCHLOROTHIAZIDE 25 MG PO TABS: 25.0000 mg | ORAL_TABLET | Freq: Every day | ORAL | 1 refills | Status: DC

## 2017-09-01 MED ORDER — ISOSORBIDE MONONITRATE ER 30 MG PO TB24: 30.0000 mg | ORAL_TABLET | Freq: Every day | ORAL | 0 refills | Status: DC

## 2017-09-01 NOTE — Patient Instructions (Addendum)
Health Maintenance, Male A healthy lifestyle and preventive care is important for your health and wellness. Ask your health care provider about what schedule of regular examinations is right for you. What should I know about weight and diet? Eat a Healthy Diet  Eat plenty of vegetables, fruits, whole grains, low-fat dairy products, and lean protein.  Do not eat a lot of foods high in solid fats, added sugars, or salt.  Maintain a Healthy Weight Regular exercise can help you achieve or maintain a healthy weight. You should:  Do at least 150 minutes of exercise each week. The exercise should increase your heart rate and make you sweat (moderate-intensity exercise).  Do strength-training exercises at least twice a week.  Watch Your Levels of Cholesterol and Blood Lipids  Have your blood tested for lipids and cholesterol every 5 years starting at 48 years of age. If you are at high risk for heart disease, you should start having your blood tested when you are 48 years old. You may need to have your cholesterol levels checked more often if: ? Your lipid or cholesterol levels are high. ? You are older than 48 years of age. ? You are at high risk for heart disease.  What should I know about cancer screening? Many types of cancers can be detected early and may often be prevented. Lung Cancer  You should be screened every year for lung cancer if: ? You are a current smoker who has smoked for at least 30 years. ? You are a former smoker who has quit within the past 15 years.  Talk to your health care provider about your screening options, when you should start screening, and how often you should be screened.  Colorectal Cancer  Routine colorectal cancer screening usually begins at 48 years of age and should be repeated every 5-10 years until you are 48 years old. You may need to be screened more often if early forms of precancerous polyps or small growths are found. Your health care provider  may recommend screening at an earlier age if you have risk factors for colon cancer.  Your health care provider may recommend using home test kits to check for hidden blood in the stool.  A small camera at the end of a tube can be used to examine your colon (sigmoidoscopy or colonoscopy). This checks for the earliest forms of colorectal cancer.  Prostate and Testicular Cancer  Depending on your age and overall health, your health care provider may do certain tests to screen for prostate and testicular cancer.  Talk to your health care provider about any symptoms or concerns you have about testicular or prostate cancer.  Skin Cancer  Check your skin from head to toe regularly.  Tell your health care provider about any new moles or changes in moles, especially if: ? There is a change in a mole's size, shape, or color. ? You have a mole that is larger than a pencil eraser.  Always use sunscreen. Apply sunscreen liberally and repeat throughout the day.  Protect yourself by wearing long sleeves, pants, a wide-brimmed hat, and sunglasses when outside.  What should I know about heart disease, diabetes, and high blood pressure?  If you are 18-39 years of age, have your blood pressure checked every 3-5 years. If you are 40 years of age or older, have your blood pressure checked every year. You should have your blood pressure measured twice-once when you are at a hospital or clinic, and once when   you are not at a hospital or clinic. Record the average of the two measurements. To check your blood pressure when you are not at a hospital or clinic, you can use: ? An automated blood pressure machine at a pharmacy. ? A home blood pressure monitor.  Talk to your health care provider about your target blood pressure.  If you are between 39-60 years old, ask your health care provider if you should take aspirin to prevent heart disease.  Have regular diabetes screenings by checking your fasting blood  sugar level. ? If you are at a normal weight and have a low risk for diabetes, have this test once every three years after the age of 77. ? If you are overweight and have a high risk for diabetes, consider being tested at a younger age or more often.  A one-time screening for abdominal aortic aneurysm (AAA) by ultrasound is recommended for men aged 65-75 years who are current or former smokers. What should I know about preventing infection? Hepatitis B If you have a higher risk for hepatitis B, you should be screened for this virus. Talk with your health care provider to find out if you are at risk for hepatitis B infection. Hepatitis C Blood testing is recommended for:  Everyone born from [birth year] through 1966.  Anyone with known risk factors for hepatitis C.  Sexually Transmitted Diseases (STDs)  You should be screened each year for STDs including gonorrhea and chlamydia if: ? You are sexually active and are younger than 48 years of age. ? You are older than 48 years of age and your health care provider tells you that you are at risk for this type of infection. ? Your sexual activity has changed since you were last screened and you are at an increased risk for chlamydia or gonorrhea. Ask your health care provider if you are at risk.  Talk with your health care provider about whether you are at high risk of being infected with HIV. Your health care provider may recommend a prescription medicine to help prevent HIV infection.  What else can I do?  Schedule regular health, dental, and eye exams.  Stay current with your vaccines (immunizations).  Do not use any tobacco products, such as cigarettes, chewing tobacco, and e-cigarettes. If you need help quitting, ask your health care provider.  Limit alcohol intake to no more than 2 drinks per day. One drink equals 12 ounces of beer, 5 ounces of wine, or 1 ounces of hard liquor.  Do not use street drugs.  Do not share needles.  Ask your  health care provider for help if you need support or information about quitting drugs.  Tell your health care provider if you often feel depressed.  Tell your health care provider if you have ever been abused or do not feel safe at home. This information is not intended to replace advice given to you by your health care provider. Make sure you discuss any questions you have with your health care provider. Document Released: 08/12/2007 Document Revised: 10/13/2015 Document Reviewed: 11/17/2014 Elsevier Interactive Patient Education  2018 [employer].    IF you received an x-ray today, you will receive an invoice from [facility]. Please contact [facility] at [phone] with questions or concerns regarding your invoice.   IF you received labwork today, you will receive an invoice from [place]. Please contact [facility] at [phone] with questions or concerns regarding your invoice.   Our billing staff will not be able  to assist you with questions regarding bills from these companies.  You will be contacted with the lab results as soon as they are available. The fastest way to get your results is to activate your My Chart account. Instructions are located on the last page of this paperwork. If you have not heard from [place] regarding the results in 2 weeks, please contact this office.      Coronary Artery Disease, Male Coronary artery disease (CAD) is a condition in which the arteries that lead to the heart (coronary arteries) become narrow or blocked. The narrowing or blockage can lead to decreased blood flow to the heart. Prolonged reduced blood flow can cause a heart attack (myocardial infarction or MI). This condition may also be called coronary heart disease. Because CAD is the leading cause of death in men, it is important to understand what causes this condition and how it is treated. What are the causes? CAD is most often caused by atherosclerosis. This is the  buildup of fat and cholesterol (plaque) on the inside of the arteries. Over time, the plaque may narrow or block the artery, reducing blood flow to the heart. Plaque can also become weak and break off within a coronary artery and cause a sudden blockage. Other less common causes of CAD include:  An embolism or blood clot in a coronary artery.  A tearing of the artery (spontaneous coronary artery dissection).  An aneurysm.  Inflammation (vasculitis) in the artery wall.  What increases the risk? The following factors may make you more likely to develop this condition:  Age. Men over age 22 are at a greater risk of CAD.  Family history of CAD.  Gender. Men often develop CAD earlier in life than women.  High blood pressure (hypertension).  Diabetes.  High cholesterol levels.  Tobacco use.  Excessive alcohol use.  Lack of exercise.  A diet high in saturated and trans fats, such as fried food and processed meat.  Other possible risk factors include:  High stress levels.  Depression.  Obesity.  Sleep apnea.  What are the signs or symptoms? Many people do not have any symptoms during the early stages of CAD. As the condition progresses, symptoms may include:  Chest pain (angina). The pain can: ? Feel like a crushing or squeezing, or a tightness, pressure, fullness, or heaviness in the chest. ? Last more than a few minutes or can stop and recur. The pain tends to get worse with exercise or stress and to fade with rest.  Pain in the arms, neck, jaw, or back.  Unexplained heartburn or indigestion.  Shortness of breath.  Nausea or vomiting.  Sudden light-headedness.  Sudden cold sweats.  Fluttering or fast heartbeat (palpitations).  How is this diagnosed? This condition is diagnosed based on:  Your family and medical history.  A physical exam.  Tests, including: ? A test to check the electrical signals in your heart (electrocardiogram). ? Exercise stress  test. This looks for signs of blockage when the heart is stressed with exercise, such as running on a treadmill. ? Pharmacologic stress test. This test looks for signs of blockage when the heart is being stressed with a medicine. ? Blood tests. ? Coronary angiogram. This is a procedure to look at the coronary arteries to see if there is any blockage. During this test, a dye is injected into your arteries so they appear on an X-ray. ? A test that uses sound waves to take a picture of your heart (  echocardiogram). ? Chest X-ray.  How is this treated? This condition may be treated by:  Healthy lifestyle changes to reduce risk factors.  Medicines such as: ? Antiplatelet medicines and blood-thinning medicines, such as aspirin. These help to prevent blood clots. ? Nitroglycerin. ? Blood pressure medicines. ? Cholesterol-lowering medicine.  Coronary angioplasty and stenting. During this procedure, a thin, flexible tube is inserted through a blood vessel and into a blocked artery. A balloon or similar device on the end of the tube is inflated to open up the artery. In some cases, a small, mesh tube (stent) is inserted into the artery to keep it open.  Coronary artery bypass surgery. During this surgery, veins or arteries from other parts of the body are used to create a bypass around the blockage and allow blood to reach your heart.  Follow these instructions at home: Medicines  Take over-the-counter and prescription medicines only as told by your health care provider.  Do not take the following medicines unless your health care provider approves: ? NSAIDs, such as ibuprofen, naproxen, or celecoxib. ? Vitamin supplements that contain vitamin A, vitamin E, or both. Lifestyle  Follow an exercise program approved by your health care provider. Aim for 150 minutes of moderate exercise or 75 minutes of vigorous exercise each week.  Maintain a healthy weight or lose weight as approved by your  health care provider.  Rest when you are tired.  Learn to manage stress or try to limit your stress. Ask your health care provider for suggestions if you need help.  Get screened for depression and seek treatment, if needed.  Do not use any products that contain nicotine or tobacco, such as cigarettes and e-cigarettes. If you need help quitting, ask your health care provider.  Do not use illegal drugs. Eating and drinking  Follow a heart-healthy diet. A dietitian can help educate you about healthy food options and changes. In general, eat plenty of fruits and vegetables, lean meats, and whole grains.  Avoid foods high in: ? Sugar. ? Salt (sodium). ? Saturated fat, such as processed or fatty meat. ? Trans fat, such as fried foods.  Use healthy cooking methods such as roasting, grilling, broiling, baking, poaching, steaming, or stir-frying.  If you drink alcohol, and your health care provider approves, limit your alcohol intake to no more than 2 drinks per day. One drink equals 12 ounces of beer, 5 ounces of wine, or 1 ounces of hard liquor. General instructions  Manage any other health conditions, such as hypertension and diabetes. These conditions affect your heart.  Your health care provider may ask you to monitor your blood pressure. Ideally, your blood pressure should be below 130/80.  Keep all follow-up visits as told by your health care provider. This is important. Get help right away if:  You have pain in your chest, neck, arm, jaw, stomach, or back that: ? Lasts more than a few minutes. ? Is recurring. ? Is not relieved by taking medicine under your tongue (sublingualnitroglycerin).  You have too much (profuse) sweating without cause.  You have unexplained: ? Heartburn or indigestion. ? Shortness of breath or difficulty breathing. ? Fluttering or fast heartbeat (palpitations). ? Nausea or vomiting. ? Fatigue. ? Feelings of nervousness or  anxiety. ? Weakness. ? Diarrhea.  You have sudden light-headedness or dizziness.  You faint.  You feel like hurting yourself or think about taking your own life. These symptoms may represent a serious problem that is an emergency. Do not wait  to see if the symptoms will go away. Get medical help right away. Call your local emergency services (911 in the [place] Do not drive yourself to the hospital. Summary  Coronary artery disease (CAD) is a process in which the arteries that lead to the heart (coronary arteries) become narrow or blocked. The narrowing or blockage can lead to a heart attack.  Many people do not have any symptoms during the early stages of CAD. This is called "silent CAD."  CAD can be treated with lifestyle changes, medicines, surgery, or a combination of these treatments. This information is not intended to replace advice given to you by your health care provider. Make sure you discuss any questions you have with your health care provider. Document Released: 09/10/2013 Document Revised: 02/04/2016 Document Reviewed: 02/04/2016 Elsevier Interactive Patient Education  [employer]2018 [employer] Inc[employer]

## 2017-09-01 NOTE — Progress Notes (Signed)
Chief Complaint  Patient presents with  . Annual Exam    Subjective:  Darren [name] is a 47 y.o. male here for a health maintenance visit.  Patient is established pt  Uncontrolled Hypertension  Noncompliance due to cost Pt reports that he has been out of his bp meds for about six months due to insurance changes and cost He denies any chest pains or headaches He reports that he is not exercising He does landscaping He reports that he smokes black and milds 2-3 times a week but no cigarette use He denies regular alcohol use but has a beer 1-2 times a week    Patient Active Problem List   Diagnosis Date Noted  . Alcohol dependence in remission (HCC) 09/01/2017  . Major depressive disorder, recurrent episode, moderate (HCC) 09/01/2017  . Dyslipidemia 02/01/2017  . Coronary artery disease involving native heart 02/01/2017  . Chest pain in adult 02/01/2017  . Hypertension 02/01/2017  . Pulmonary emphysema (HCC) 03/24/2016  . Angina pectoris (HCC) 11/11/2015  . Aortic insufficiency   . MDD (major depressive disorder) 08/06/2014  . ETOH abuse 08/06/2014  . Depression   . Coronary artery disease due to lipid rich plaque   . Essential hypertension 03/04/2014  . Tobacco abuse 03/04/2014    Past Medical History:  Diagnosis Date  . Anxiety   . Aortic insufficiency    moderate by echo 04/2014  . Coronary artery disease 03/24/2014   NSTEMI s/p PCI of mid LAD and repeat cath with patent stent  . Depression   . Hypertension   . Tobacco abuse     Past Surgical History:  Procedure Laterality Date  . CARDIAC CATHETERIZATION  03/04/2014   PCI of mid LAD  . CARDIAC CATHETERIZATION N/A 11/12/2015   Procedure: Left Heart Cath and Coronary Angiography;  Surgeon: Tonny Bollman, MD;  Location: [facility] INVASIVE CV LAB;  Service: Cardiovascular;  Laterality: N/A;  . LEFT HEART CATHETERIZATION WITH CORONARY ANGIOGRAM Bilateral 03/04/2014   Procedure: LEFT HEART CATHETERIZATION WITH CORONARY  ANGIOGRAM;  Surgeon: Kathleene Hazel, MD;  Location: [facility] CATH LAB;  Service: Cardiovascular;  Laterality: Bilateral;  . LEFT HEART CATHETERIZATION WITH CORONARY ANGIOGRAM N/A 03/26/2014   Procedure: LEFT HEART CATHETERIZATION WITH CORONARY ANGIOGRAM;  Surgeon: Lennette Bihari, MD;  Location: [facility] CATH LAB;  Service: Cardiovascular;  Laterality: N/A;     Outpatient Medications Prior to Visit  Medication Sig Dispense Refill  . aspirin EC 81 MG tablet Take 81 mg by mouth.    Marland Kitchen atorvastatin (LIPITOR) 80 MG tablet Take 1 tablet (80 mg total) by mouth daily at 6 PM. For high cholesterol 90 tablet 3  . metoprolol succinate (TOPROL-XL) 25 MG 24 hr tablet Take 25 mg by mouth daily.    . mometasone-formoterol (DULERA) 100-5 MCG/ACT AERO Inhale 2 puffs into the lungs 2 (two) times daily. 13 g 1  . hydrochlorothiazide (HYDRODIURIL) 25 MG tablet Take 0.5 tablets (12.5 mg total) by mouth daily. 45 tablet 1  . isosorbide mononitrate (IMDUR) 30 MG 24 hr tablet Take 30 mg by mouth daily.    . methocarbamol (ROBAXIN) 500 MG tablet Take 1 tablet (500 mg total) by mouth 2 (two) times daily. 20 tablet 0  . nitroGLYCERIN (NITROSTAT) 0.4 MG SL tablet Place 1 tablet (0.4 mg total) under the tongue every 5 (five) minutes as needed for chest pain. 30 tablet 0  . pantoprazole (PROTONIX) 20 MG tablet Take 1 tablet (20 mg total) by mouth daily. 90 tablet 3  .  predniSONE (DELTASONE) 50 MG tablet Take one tablet PO daily for inflammation. 5 tablet 0  . sucralfate (CARAFATE) 1 g tablet Take 1 g by mouth 4 (four) times daily.    . traMADol (ULTRAM) 50 MG tablet Take 1 tablet (50 mg total) by mouth every 6 (six) hours as needed. 6 tablet 0   No facility-administered medications prior to visit.     Allergies  Allergen Reactions  . Hydrocodone Hives     Family History  Problem Relation Age of Onset  . Hypertension Mother   . Hypertension Father   . Alcohol abuse Father   . Hypertension Other   . Diabetes Other     . Heart attack Other      Health Habits: Dental Exam: up to date Eye Exam: up to date   Social History   Socioeconomic History  . Marital status: Legally Separated    Spouse name: Not on file  . Number of children: Not on file  . Years of education: Not on file  . Highest education level: Not on file  Occupational History  . Not on file  Social Needs  . Financial resource strain: Not on file  . Food insecurity:    Worry: Not on file    Inability: Not on file  . Transportation needs:    Medical: Not on file    Non-medical: Not on file  Tobacco Use  . Smoking status: Former Smoker    Packs/day: 0.50    Years: 30.00    Pack years: 15.00    Types: Cigarettes    Last attempt to quit: 12/29/2015    Years since quitting: 1.6  . Smokeless tobacco: Never Used  Substance and Sexual Activity  . Alcohol use: No    Alcohol/week: 0.0 oz  . Drug use: Yes    Types: Marijuana    Comment: daily use  . Sexual activity: Not Currently    Birth control/protection: Condom  Lifestyle  . Physical activity:    Days per week: Not on file    Minutes per session: Not on file  . Stress: Not on file  Relationships  . Social connections:    Talks on phone: Not on file    Gets together: Not on file    Attends religious service: Not on file    Active member of club or organization: Not on file    Attends meetings of clubs or organizations: Not on file    Relationship status: Not on file  . Intimate partner violence:    Fear of current or ex partner: Not on file    Emotionally abused: Not on file    Physically abused: Not on file    Forced sexual activity: Not on file  Other Topics Concern  . Not on file  Social History Narrative  . Not on file   Social History   Substance and Sexual Activity  Alcohol Use No  . Alcohol/week: 0.0 oz   Social History   Tobacco Use  Smoking Status Former Smoker  . Packs/day: 0.50  . Years: 30.00  . Pack years: 15.00  . Types: Cigarettes  .  Last attempt to quit: 12/29/2015  . Years since quitting: 1.6  Smokeless Tobacco Never Used   Social History   Substance and Sexual Activity  Drug Use Yes  . Types: Marijuana   Comment: daily use     Health Maintenance: See under health Maintenance activity for review of completion dates as well. Immunization History  Administered Date(s) Administered  . Influenza,inj,Quad PF,6+ Mos 02/03/2017  . Influenza-Unspecified 12/29/2015  . Pneumococcal Polysaccharide-23 02/03/2017  . Tdap 09/01/2017      Depression Screen-PHQ2/9 Depression screen [facility] 2/9 09/01/2017 02/07/2017 04/07/2016 03/24/2016 10/14/2015  Decreased Interest 0 0 0 0 0  Down, Depressed, Hopeless 0 0 0 0 1  PHQ - 2 Score 0 0 0 0 1  Altered sleeping - - - - 2  Tired, decreased energy - - - - 3  Change in appetite - - - - 2  Feeling bad or failure about yourself  - - - - 3  Trouble concentrating - - - - 2  Moving slowly or fidgety/restless - - - - 0  Suicidal thoughts - - - - 1  PHQ-9 Score - - - - 14  Some encounter information is confidential and restricted. Go to Review Flowsheets activity to see all data.     Depression Severity and Treatment Recommendations:  0-4= None  5-9= Mild / Treatment: Support, educate to call if worse; return in one month  10-14= Moderate / Treatment: Support, watchful waiting; Antidepressant or Psycotherapy  15-19= Moderately severe / Treatment: Antidepressant OR Psychotherapy  >= 20 = Major depression, severe / Antidepressant AND Psychotherapy    Review of Systems   Review of Systems  Constitutional: Negative for chills and fever.  HENT: Negative for congestion and sinus pain.   Eyes: Negative for blurred vision and double vision.  Respiratory: Negative for cough, sputum production and shortness of breath.   Cardiovascular: Negative for chest pain, palpitations and claudication.  Gastrointestinal: Negative for abdominal pain, heartburn, nausea and vomiting.  Genitourinary:  Negative for dysuria, frequency and urgency.  Skin: Negative for itching and rash.  Neurological: Negative for dizziness, tingling, tremors and headaches.  Endo/Heme/Allergies: Negative for environmental allergies. Does not bruise/bleed easily.  Psychiatric/Behavioral: Negative for depression. The patient is not nervous/anxious.     See HPI for ROS as well.    Objective:   Vitals:   09/01/17 0839  BP: (!) 169/80  Pulse: (!) 53  Resp: 16  Temp: 98.1 F (36.7 C)  TempSrc: Oral  SpO2: 100%  Weight: 160 lb (72.6 kg)  Height: 5\' 7"  (1.702 m)    Body mass index is 25.06 kg/m.  Physical Exam  Constitutional: He is oriented to person, place, and time. He appears well-developed and well-nourished.  HENT:  Head: Normocephalic and atraumatic.  Right Ear: External ear normal.  Left Ear: External ear normal.  Mouth/Throat: Oropharynx is clear and moist.  Eyes: Conjunctivae and EOM are normal.  Neck: Normal range of motion. Neck supple. No thyromegaly present.  Cardiovascular: Normal rate, regular rhythm and normal heart sounds.  No murmur heard. Pulmonary/Chest: Effort normal and breath sounds normal. No stridor. No respiratory distress.  Abdominal: Soft. Bowel sounds are normal. He exhibits no distension and no mass. There is no tenderness. There is no guarding.  Musculoskeletal: Normal range of motion. He exhibits no edema.  Neurological: He is alert and oriented to person, place, and time.  Skin: Skin is warm. Capillary refill takes less than 2 seconds.  Psychiatric: He has a normal mood and affect. His behavior is normal. Judgment and thought content normal.     Assessment/Plan:   Patient was seen for a health maintenance exam.  Counseled the patient on health maintenance issues. Reviewed her health mainteance schedule and ordered appropriate tests (see orders.) Counseled on regular exercise and weight management. Recommend regular eye exams and dental cleaning.  The  following issues were addressed today for health maintenance:   Marselino was seen today for annual exam.  Diagnoses and all orders for this visit:  Annual physical exam- discussed age appropriate screenings  Need for Tdap vaccination -     Tdap vaccine greater than or equal to 7yo IM  Coronary artery disease due to lipid rich plaque Discussed compliance and will verify -     Comprehensive metabolic panel -     Lipid panel -     TSH -     isosorbide mononitrate (IMDUR) 30 MG 24 hr tablet; Take 1 tablet (30 mg total) by mouth daily. -     nitroGLYCERIN (NITROSTAT) 0.4 MG SL tablet; Place 1 tablet (0.4 mg total) under the tongue every 5 (five) minutes as needed for chest pain.  Essential hypertension Discussed resuming bp meds -     Comprehensive metabolic panel -     isosorbide mononitrate (IMDUR) 30 MG 24 hr tablet; Take 1 tablet (30 mg total) by mouth daily. -     hydrochlorothiazide (HYDRODIURIL) 25 MG tablet; Take 1 tablet (25 mg total) by mouth daily.  Anxiety and depression Discussed continuing behavioral healthy -     CBC -     TSH  Pulmonary emphysema, unspecified emphysema type (HCC)  Major depressive disorder, recurrent episode, moderate (HCC)  Screening PSA (prostate specific antigen)-  -     PSA  Prediabetes -     Hemoglobin A1c  Screening for diabetes mellitus -     Hemoglobin A1c    Return in about 2 weeks (around 09/15/2017) for bp check and review lab results.    Body mass index is 25.06 kg/m.:  Discussed the patient's BMI with patient. The BMI body mass index is 25.06 kg/m.     Future Appointments  Date Time Provider Department Center  09/22/2017  8:15 AM Arfeen, Phillips Grout, MD [facility] None    Patient Instructions   Health Maintenance, Male A healthy lifestyle and preventive care is important for your health and wellness. Ask your health care provider about what schedule of regular examinations is right for you. What should I know about weight  and diet? Eat a Healthy Diet  Eat plenty of vegetables, fruits, whole grains, low-fat dairy products, and lean protein.  Do not eat a lot of foods high in solid fats, added sugars, or salt.  Maintain a Healthy Weight Regular exercise can help you achieve or maintain a healthy weight. You should:  Do at least 150 minutes of exercise each week. The exercise should increase your heart rate and make you sweat (moderate-intensity exercise).  Do strength-training exercises at least twice a week.  Watch Your Levels of Cholesterol and Blood Lipids  Have your blood tested for lipids and cholesterol every 5 years starting at 48 years of age. If you are at high risk for heart disease, you should start having your blood tested when you are 48 years old. You may need to have your cholesterol levels checked more often if: ? Your lipid or cholesterol levels are high. ? You are older than 48 years of age. ? You are at high risk for heart disease.  What should I know about cancer screening? Many types of cancers can be detected early and may often be prevented. Lung Cancer  You should be screened every year for lung cancer if: ? You are a current smoker who has smoked for at least 30 years. ? You are a former  smoker who has quit within the past 15 years.  Talk to your health care provider about your screening options, when you should start screening, and how often you should be screened.  Colorectal Cancer  Routine colorectal cancer screening usually begins at 48 years of age and should be repeated every 5-10 years until you are 48 years old. You may need to be screened more often if early forms of precancerous polyps or small growths are found. Your health care provider may recommend screening at an earlier age if you have risk factors for colon cancer.  Your health care provider may recommend using home test kits to check for hidden blood in the stool.  A small camera at the end of a tube can  be used to examine your colon (sigmoidoscopy or colonoscopy). This checks for the earliest forms of colorectal cancer.  Prostate and Testicular Cancer  Depending on your age and overall health, your health care provider may do certain tests to screen for prostate and testicular cancer.  Talk to your health care provider about any symptoms or concerns you have about testicular or prostate cancer.  Skin Cancer  Check your skin from head to toe regularly.  Tell your health care provider about any new moles or changes in moles, especially if: ? There is a change in a mole's size, shape, or color. ? You have a mole that is larger than a pencil eraser.  Always use sunscreen. Apply sunscreen liberally and repeat throughout the day.  Protect yourself by wearing long sleeves, pants, a wide-brimmed hat, and sunglasses when outside.  What should I know about heart disease, diabetes, and high blood pressure?  If you are 6-25 years of age, have your blood pressure checked every 3-5 years. If you are 31 years of age or older, have your blood pressure checked every year. You should have your blood pressure measured twice-once when you are at a hospital or clinic, and once when you are not at a hospital or clinic. Record the average of the two measurements. To check your blood pressure when you are not at a hospital or clinic, you can use: ? An automated blood pressure machine at a pharmacy. ? A home blood pressure monitor.  Talk to your health care provider about your target blood pressure.  If you are between 11-56 years old, ask your health care provider if you should take aspirin to prevent heart disease.  Have regular diabetes screenings by checking your fasting blood sugar level. ? If you are at a normal weight and have a low risk for diabetes, have this test once every three years after the age of 27. ? If you are overweight and have a high risk for diabetes, consider being tested at a  younger age or more often.  A one-time screening for abdominal aortic aneurysm (AAA) by ultrasound is recommended for men aged 65-75 years who are current or former smokers. What should I know about preventing infection? Hepatitis B If you have a higher risk for hepatitis B, you should be screened for this virus. Talk with your health care provider to find out if you are at risk for hepatitis B infection. Hepatitis C Blood testing is recommended for:  Everyone born from [birth year] through 1966.  Anyone with known risk factors for hepatitis C.  Sexually Transmitted Diseases (STDs)  You should be screened each year for STDs including gonorrhea and chlamydia if: ? You are sexually active and are younger than 48 years  of age. ? You are older than 48 years of age and your health care provider tells you that you are at risk for this type of infection. ? Your sexual activity has changed since you were last screened and you are at an increased risk for chlamydia or gonorrhea. Ask your health care provider if you are at risk.  Talk with your health care provider about whether you are at high risk of being infected with HIV. Your health care provider may recommend a prescription medicine to help prevent HIV infection.  What else can I do?  Schedule regular health, dental, and eye exams.  Stay current with your vaccines (immunizations).  Do not use any tobacco products, such as cigarettes, chewing tobacco, and e-cigarettes. If you need help quitting, ask your health care provider.  Limit alcohol intake to no more than 2 drinks per day. One drink equals 12 ounces of beer, 5 ounces of wine, or 1 ounces of hard liquor.  Do not use street drugs.  Do not share needles.  Ask your health care provider for help if you need support or information about quitting drugs.  Tell your health care provider if you often feel depressed.  Tell your health care provider if you have ever been abused or do not  feel safe at home. This information is not intended to replace advice given to you by your health care provider. Make sure you discuss any questions you have with your health care provider. Document Released: 08/12/2007 Document Revised: 10/13/2015 Document Reviewed: 11/17/2014 Elsevier Interactive Patient Education  2018 [employer].    IF you received an x-ray today, you will receive an invoice from [facility]. Please contact [facility] at [phone] with questions or concerns regarding your invoice.   IF you received labwork today, you will receive an invoice from [place]. Please contact [facility] at [phone] with questions or concerns regarding your invoice.   Our billing staff will not be able to assist you with questions regarding bills from these companies.  You will be contacted with the lab results as soon as they are available. The fastest way to get your results is to activate your My Chart account. Instructions are located on the last page of this paperwork. If you have not heard from [place] regarding the results in 2 weeks, please contact this office.      Coronary Artery Disease, Male Coronary artery disease (CAD) is a condition in which the arteries that lead to the heart (coronary arteries) become narrow or blocked. The narrowing or blockage can lead to decreased blood flow to the heart. Prolonged reduced blood flow can cause a heart attack (myocardial infarction or MI). This condition may also be called coronary heart disease. Because CAD is the leading cause of death in men, it is important to understand what causes this condition and how it is treated. What are the causes? CAD is most often caused by atherosclerosis. This is the buildup of fat and cholesterol (plaque) on the inside of the arteries. Over time, the plaque may narrow or block the artery, reducing blood flow to the heart. Plaque can also become weak and break off within a coronary artery  and cause a sudden blockage. Other less common causes of CAD include:  An embolism or blood clot in a coronary artery.  A tearing of the artery (spontaneous coronary artery dissection).  An aneurysm.  Inflammation (vasculitis) in the artery wall.  What increases the risk? The following factors may make  you more likely to develop this condition:  Age. Men over age 54 are at a greater risk of CAD.  Family history of CAD.  Gender. Men often develop CAD earlier in life than women.  High blood pressure (hypertension).  Diabetes.  High cholesterol levels.  Tobacco use.  Excessive alcohol use.  Lack of exercise.  A diet high in saturated and trans fats, such as fried food and processed meat.  Other possible risk factors include:  High stress levels.  Depression.  Obesity.  Sleep apnea.  What are the signs or symptoms? Many people do not have any symptoms during the early stages of CAD. As the condition progresses, symptoms may include:  Chest pain (angina). The pain can: ? Feel like a crushing or squeezing, or a tightness, pressure, fullness, or heaviness in the chest. ? Last more than a few minutes or can stop and recur. The pain tends to get worse with exercise or stress and to fade with rest.  Pain in the arms, neck, jaw, or back.  Unexplained heartburn or indigestion.  Shortness of breath.  Nausea or vomiting.  Sudden light-headedness.  Sudden cold sweats.  Fluttering or fast heartbeat (palpitations).  How is this diagnosed? This condition is diagnosed based on:  Your family and medical history.  A physical exam.  Tests, including: ? A test to check the electrical signals in your heart (electrocardiogram). ? Exercise stress test. This looks for signs of blockage when the heart is stressed with exercise, such as running on a treadmill. ? Pharmacologic stress test. This test looks for signs of blockage when the heart is being stressed with a  medicine. ? Blood tests. ? Coronary angiogram. This is a procedure to look at the coronary arteries to see if there is any blockage. During this test, a dye is injected into your arteries so they appear on an X-ray. ? A test that uses sound waves to take a picture of your heart (echocardiogram). ? Chest X-ray.  How is this treated? This condition may be treated by:  Healthy lifestyle changes to reduce risk factors.  Medicines such as: ? Antiplatelet medicines and blood-thinning medicines, such as aspirin. These help to prevent blood clots. ? Nitroglycerin. ? Blood pressure medicines. ? Cholesterol-lowering medicine.  Coronary angioplasty and stenting. During this procedure, a thin, flexible tube is inserted through a blood vessel and into a blocked artery. A balloon or similar device on the end of the tube is inflated to open up the artery. In some cases, a small, mesh tube (stent) is inserted into the artery to keep it open.  Coronary artery bypass surgery. During this surgery, veins or arteries from other parts of the body are used to create a bypass around the blockage and allow blood to reach your heart.  Follow these instructions at home: Medicines  Take over-the-counter and prescription medicines only as told by your health care provider.  Do not take the following medicines unless your health care provider approves: ? NSAIDs, such as ibuprofen, naproxen, or celecoxib. ? Vitamin supplements that contain vitamin A, vitamin E, or both. Lifestyle  Follow an exercise program approved by your health care provider. Aim for 150 minutes of moderate exercise or 75 minutes of vigorous exercise each week.  Maintain a healthy weight or lose weight as approved by your health care provider.  Rest when you are tired.  Learn to manage stress or try to limit your stress. Ask your health care provider for suggestions if you  need help.  Get screened for depression and seek treatment, if  needed.  Do not use any products that contain nicotine or tobacco, such as cigarettes and e-cigarettes. If you need help quitting, ask your health care provider.  Do not use illegal drugs. Eating and drinking  Follow a heart-healthy diet. A dietitian can help educate you about healthy food options and changes. In general, eat plenty of fruits and vegetables, lean meats, and whole grains.  Avoid foods high in: ? Sugar. ? Salt (sodium). ? Saturated fat, such as processed or fatty meat. ? Trans fat, such as fried foods.  Use healthy cooking methods such as roasting, grilling, broiling, baking, poaching, steaming, or stir-frying.  If you drink alcohol, and your health care provider approves, limit your alcohol intake to no more than 2 drinks per day. One drink equals 12 ounces of beer, 5 ounces of wine, or 1 ounces of hard liquor. General instructions  Manage any other health conditions, such as hypertension and diabetes. These conditions affect your heart.  Your health care provider may ask you to monitor your blood pressure. Ideally, your blood pressure should be below 130/80.  Keep all follow-up visits as told by your health care provider. This is important. Get help right away if:  You have pain in your chest, neck, arm, jaw, stomach, or back that: ? Lasts more than a few minutes. ? Is recurring. ? Is not relieved by taking medicine under your tongue (sublingualnitroglycerin).  You have too much (profuse) sweating without cause.  You have unexplained: ? Heartburn or indigestion. ? Shortness of breath or difficulty breathing. ? Fluttering or fast heartbeat (palpitations). ? Nausea or vomiting. ? Fatigue. ? Feelings of nervousness or anxiety. ? Weakness. ? Diarrhea.  You have sudden light-headedness or dizziness.  You faint.  You feel like hurting yourself or think about taking your own life. These symptoms may represent a serious problem that is an emergency. Do not  wait to see if the symptoms will go away. Get medical help right away. Call your local emergency services (911 in the [place] Do not drive yourself to the hospital. Summary  Coronary artery disease (CAD) is a process in which the arteries that lead to the heart (coronary arteries) become narrow or blocked. The narrowing or blockage can lead to a heart attack.  Many people do not have any symptoms during the early stages of CAD. This is called "silent CAD."  CAD can be treated with lifestyle changes, medicines, surgery, or a combination of these treatments. This information is not intended to replace advice given to you by your health care provider. Make sure you discuss any questions you have with your health care provider. Document Released: 09/10/2013 Document Revised: 02/04/2016 Document Reviewed: 02/04/2016 Elsevier Interactive Patient Education  [employer].

## 2017-09-02 LAB — HEMOGLOBIN A1C
Est. average glucose Bld gHb Est-mCnc: 126 mg/dL
Hgb A1c MFr Bld: 6 % — ABNORMAL HIGH (ref 4.8–5.6)

## 2017-09-02 LAB — COMPREHENSIVE METABOLIC PANEL
ALT: 12 IU/L (ref 0–44)
AST: 18 IU/L (ref 0–40)
Albumin/Globulin Ratio: 1.6 (ref 1.2–2.2)
Albumin: 4.5 g/dL (ref 3.5–5.5)
Alkaline Phosphatase: 66 IU/L (ref 39–117)
BILIRUBIN TOTAL: 0.4 mg/dL (ref 0.0–1.2)
BUN / CREAT RATIO: 11 (ref 9–20)
BUN: 12 mg/dL (ref 6–24)
CHLORIDE: 102 mmol/L (ref 96–106)
CO2: 22 mmol/L (ref 20–29)
CREATININE: 1.08 mg/dL (ref 0.76–1.27)
Calcium: 9.2 mg/dL (ref 8.7–10.2)
GFR calc Af Amer: 94 mL/min/{1.73_m2} (ref >59)
GFR calc non Af Amer: 81 mL/min/{1.73_m2} (ref >59)
GLUCOSE: 85 mg/dL (ref 65–99)
Globulin, Total: 2.8 g/dL (ref 1.5–4.5)
Potassium: 4.6 mmol/L (ref 3.5–5.2)
Sodium: 137 mmol/L (ref 134–144)
Total Protein: 7.3 g/dL (ref 6.0–8.5)

## 2017-09-02 LAB — LIPID PANEL
Chol/HDL Ratio: 3.9 ratio (ref 0.0–5.0)
Cholesterol, Total: 220 mg/dL — ABNORMAL HIGH (ref 100–199)
HDL: 56 mg/dL (ref >39)
LDL CALC: 138 mg/dL — AB (ref 0–99)
Triglycerides: 130 mg/dL (ref 0–149)
VLDL CHOLESTEROL CAL: 26 mg/dL (ref 5–40)

## 2017-09-02 LAB — CBC
Hematocrit: 45.2 % (ref 37.5–51.0)
Hemoglobin: 14.3 g/dL (ref 13.0–17.7)
MCH: 28.4 pg (ref 26.6–33.0)
MCHC: 31.6 g/dL (ref 31.5–35.7)
MCV: 90 fL (ref 79–97)
PLATELETS: 275 10*3/uL (ref 150–450)
RBC: 5.03 x10E6/uL (ref 4.14–5.80)
RDW: 15.1 % (ref 12.3–15.4)
WBC: 5.6 10*3/uL (ref 3.4–10.8)

## 2017-09-02 LAB — TSH: TSH: 0.403 u[IU]/mL — ABNORMAL LOW (ref 0.450–4.500)

## 2017-09-02 LAB — PSA: PROSTATE SPECIFIC AG, SERUM: 0.9 ng/mL (ref 0.0–4.0)

## 2017-09-06 ENCOUNTER — Encounter: Payer: Self-pay | Admitting: Family Medicine

## 2017-09-06 ENCOUNTER — Encounter: Payer: Self-pay | Admitting: Physician Assistant

## 2017-09-06 ENCOUNTER — Other Ambulatory Visit: Payer: Self-pay

## 2017-09-06 ENCOUNTER — Ambulatory Visit: Payer: BLUE CROSS/BLUE SHIELD | Admitting: Physician Assistant

## 2017-09-06 VITALS — BP 134/76 | HR 71 | Temp 98.8°F | Resp 16 | Ht 65.0 in | Wt 159.6 lb

## 2017-09-06 DIAGNOSIS — G44209 Tension-type headache, unspecified, not intractable: Secondary | ICD-10-CM | POA: Diagnosis not present

## 2017-09-06 MED ORDER — CYCLOBENZAPRINE HCL 10 MG PO TABS: 5.0000 mg | ORAL_TABLET | Freq: Three times a day (TID) | ORAL | 0 refills | Status: DC | PRN

## 2017-09-06 MED ORDER — TRAMADOL HCL 50 MG PO TABS: 25.0000 mg | ORAL_TABLET | Freq: Three times a day (TID) | ORAL | 0 refills | Status: DC | PRN

## 2017-09-06 NOTE — Progress Notes (Signed)
09/10/2017 9:25 AM   DOB: 08/15/1970 / MRN: [id number]  SUBJECTIVE:  Darren [name] is a 47 y.o. male presenting for hatband-like headache started about 12 hours ago.  This is preceded by neck pain over the last week.  Patient denies weakness, numbness, paresthesia.  Associates of mild photophobia, phonophobia, nausea without emesis.  Patient denies rash.  He denies fever, chills, chest pain, shortness of breath, leg swelling, orthopnea.  He is allergic to hydrocodone.   He  has a past medical history of Anxiety, Aortic insufficiency, Coronary artery disease (03/24/2014), Depression, Hypertension, and Tobacco abuse.    He  reports that he quit smoking about 20 months ago. His smoking use included cigarettes. He has a 15.00 pack-year smoking history. He has never used smokeless tobacco. He reports that he has current or past drug history. Drug: Marijuana. He reports that he does not drink alcohol. He  reports that he does not currently engage in sexual activity. He reports using the following method of birth control/protection: Condom. The patient  has a past surgical history that includes left heart catheterization with coronary angiogram (Bilateral, 03/04/2014); left heart catheterization with coronary angiogram (N/A, 03/26/2014); Cardiac catheterization (03/04/2014); and Cardiac catheterization (N/A, 11/12/2015).  His family history includes Alcohol abuse in his father; Diabetes in his other; Heart attack in his other; Hypertension in his father, mother, and other.  ROS per HPI  The problem list and medications were reviewed and updated by myself where necessary and exist elsewhere in the encounter.   OBJECTIVE:  BP 134/76 (BP Location: Left Arm, Patient Position: Sitting, Cuff Size: Normal)   Pulse 71   Temp 98.8 F (37.1 C) (Oral)   Resp 16   Ht 5\' 5"  (1.651 m)   Wt 159 lb 9.6 oz (72.4 kg)   SpO2 99%   BMI 26.56 kg/m   Wt Readings from Last 3 Encounters:  09/06/17 159 lb 9.6 oz  (72.4 kg)  09/01/17 160 lb (72.6 kg)  07/02/17 160 lb (72.6 kg)   Temp Readings from Last 3 Encounters:  09/06/17 98.8 F (37.1 C) (Oral)  09/01/17 98.1 F (36.7 C) (Oral)  07/02/17 98.2 F (36.8 C) (Oral)   BP Readings from Last 3 Encounters:  09/06/17 134/76  09/01/17 (!) 169/80  07/02/17 (!) 170/79  Had a great exam Pulse Readings from Last 3 Encounters:  09/06/17 71  09/01/17 (!) 53  07/02/17 (!) 58    Physical Exam  Constitutional: He is oriented to person, place, and time. He appears well-developed. He is active.  Non-toxic appearance. He does not appear ill.  Eyes: Pupils are equal, round, and reactive to light. Conjunctivae and EOM are normal.  Cardiovascular: Normal rate, regular rhythm, S1 normal, S2 normal, normal heart sounds, intact distal pulses and normal pulses. Exam reveals no gallop and no friction rub.  No murmur heard. Pulmonary/Chest: Effort normal. No stridor. No respiratory distress. He has no wheezes. He has no rales.  Abdominal: He exhibits no distension.  Musculoskeletal: Normal range of motion. He exhibits no edema.  Neurological: He is alert and oriented to person, place, and time. He has normal strength and normal reflexes. He is not disoriented. He displays normal reflexes. No cranial nerve deficit or sensory deficit. He exhibits normal muscle tone. He displays a negative Romberg sign. Coordination and gait normal. GCS eye subscore is 4. GCS verbal subscore is 5. GCS motor subscore is 6.  Skin: Skin is warm and dry. He is not diaphoretic. No  pallor.  Psychiatric: He has a normal mood and affect. His behavior is normal.  Nursing note and vitals reviewed.   Lab Results  Component Value Date   HGBA1C 6.0 (H) 09/01/2017    Lab Results  Component Value Date   WBC 5.6 09/01/2017   HGB 14.3 09/01/2017   HCT 45.2 09/01/2017   MCV 90 09/01/2017   PLT 275 09/01/2017    Lab Results  Component Value Date   CREATININE 1.08 09/01/2017   BUN 12  09/01/2017   NA 137 09/01/2017   K 4.6 09/01/2017   CL 102 09/01/2017   CO2 22 09/01/2017    Lab Results  Component Value Date   ALT 12 09/01/2017   AST 18 09/01/2017   ALKPHOS 66 09/01/2017   BILITOT 0.4 09/01/2017    Lab Results  Component Value Date   TSH 0.403 (L) 09/01/2017    Lab Results  Component Value Date   CHOL 220 (H) 09/01/2017   HDL 56 09/01/2017   LDLCALC 138 (H) 09/01/2017   TRIG 130 09/01/2017   CHOLHDL 3.9 09/01/2017     ASSESSMENT AND PLAN:  Darren [name] was seen today for headache, jaw pain and legs.  Diagnoses and all orders for this visit:  Acute non intractable tension-type headache  Other orders -     cyclobenzaprine (FLEXERIL) 10 MG tablet; Take 0.5-1 tablets (5-10 mg total) by mouth 3 (three) times daily as needed. -     traMADol (ULTRAM) 50 MG tablet; Take 0.5-1.5 tablets (25-75 mg total) by mouth every 8 (eight) hours as needed.    The patient is advised to call or return to clinic if he does not see an improvement in symptoms, or to seek the care of the closest emergency department if he worsens with the above plan.   Deliah BostonMichael Mairin Lindsley, MHS, PA-C [facility] 09/10/2017 9:25 AM

## 2017-09-06 NOTE — Patient Instructions (Addendum)
Take tylenol 1000 mg every 8 hours along with prescription meds.     IF you received an x-ray today, you will receive an invoice from [facility]. Please contact [facility] at [phone] with questions or concerns regarding your invoice.   IF you received labwork today, you will receive an invoice from [facility]. Please contact [facility] at [phone] with questions or concerns regarding your invoice.   Our billing staff will not be able to assist you with questions regarding bills from these companies.  You will be contacted with the lab results as soon as they are available. The fastest way to get your results is to activate your My Chart account. Instructions are located on the last page of this paperwork. If you have not heard from us regarding the results in 2 weeks, please contact this office.

## 2017-09-19 ENCOUNTER — Ambulatory Visit: Payer: BLUE CROSS/BLUE SHIELD | Admitting: Family Medicine

## 2017-09-22 ENCOUNTER — Ambulatory Visit (HOSPITAL_COMMUNITY): Payer: Self-pay | Admitting: Psychiatry

## 2017-09-26 ENCOUNTER — Ambulatory Visit (INDEPENDENT_AMBULATORY_CARE_PROVIDER_SITE_OTHER): Payer: BLUE CROSS/BLUE SHIELD | Admitting: Psychiatry

## 2017-09-26 ENCOUNTER — Encounter (HOSPITAL_COMMUNITY): Payer: Self-pay | Admitting: Psychiatry

## 2017-09-26 VITALS — BP 161/93 | HR 83 | Ht 65.0 in | Wt 156.0 lb

## 2017-09-26 DIAGNOSIS — F331 Major depressive disorder, recurrent, moderate: Secondary | ICD-10-CM | POA: Diagnosis not present

## 2017-09-26 MED ORDER — MIRTAZAPINE 15 MG PO TABS: 15.0000 mg | ORAL_TABLET | Freq: Every day | ORAL | 0 refills | Status: DC

## 2017-09-26 MED ORDER — SERTRALINE HCL 50 MG PO TABS
ORAL_TABLET | ORAL | 0 refills | Status: DC
Start: 2017-09-26 — End: 2017-10-24

## 2017-09-26 NOTE — Progress Notes (Signed)
[facility] MD/PA/NP OP Progress Note  09/26/2017 4:03 PM Darren AgeeCharles A Sidener  MRN:  [id number]  Chief Complaint: insurance ran out, getting back in care  HPI: Darren [name] reports that he disappeared for a while because he did not have health insurance.  He is working at [employer] and is full-time.  He is not too keen on the job but it pays the bills.  He would like to restart medicines for his depression.  We spent time discussing his symptoms of anhedonia, anxiety, irritability, poor frustration tolerance.  We agreed to start Zoloft given the safe cardiac profile and initiate augmentation with Remeron nightly given his complaints of poor sleep and low appetite.  He has previously used trazodone and has some leftover at home but it never seems to work consistently.  He denies any acute safety issues and has good support from his girlfriend who is present today in the office.  He is agreeable to follow-up in 4 weeks to see how things are going with the medication initiation.  Visit Diagnosis: No diagnosis found.  Past Psychiatric History: See intake H&P for full details. Reviewed, with no updates at this time.   Past Medical History:  Past Medical History:  Diagnosis Date  . Anxiety   . Aortic insufficiency    moderate by echo 04/2014  . Coronary artery disease 03/24/2014   NSTEMI s/p PCI of mid LAD and repeat cath with patent stent  . Depression   . Hypertension   . Tobacco abuse     Past Surgical History:  Procedure Laterality Date  . CARDIAC CATHETERIZATION  03/04/2014   PCI of mid LAD  . CARDIAC CATHETERIZATION N/A 11/12/2015   Procedure: Left Heart Cath and Coronary Angiography;  Surgeon: Tonny BollmanMichael Cooper, MD;  [facility];  Service: Cardiovascular;  Laterality: N/A;  . LEFT HEART CATHETERIZATION WITH CORONARY ANGIOGRAM Bilateral 03/04/2014   Procedure: LEFT HEART CATHETERIZATION WITH CORONARY ANGIOGRAM;  Surgeon: Kathleene Hazelhristopher D McAlhany, MD;  [facility];   Service: Cardiovascular;  Laterality: Bilateral;  . LEFT HEART CATHETERIZATION WITH CORONARY ANGIOGRAM N/A 03/26/2014   Procedure: LEFT HEART CATHETERIZATION WITH CORONARY ANGIOGRAM;  Surgeon: Lennette Biharihomas A Kelly, MD;  [facility];  Service: Cardiovascular;  Laterality: N/A;    Family Psychiatric History: See intake H&P for full details. Reviewed, with no updates at this time.   Family History:  Family History  Problem Relation Age of Onset  . Hypertension Mother   . Hypertension Father   . Alcohol abuse Father   . Hypertension Other   . Diabetes Other   . Heart attack Other     Social History:  Social History   Socioeconomic History  . Marital status: Legally Separated    Spouse name: Not on file  . Number of children: Not on file  . Years of education: Not on file  . Highest education level: Not on file  Occupational History  . Not on file  Social Needs  . Financial resource strain: Not on file  . Food insecurity:    Worry: Not on file    Inability: Not on file  . Transportation needs:    Medical: Not on file    Non-medical: Not on file  Tobacco Use  . Smoking status: Former Smoker    Packs/day: 0.50    Years: 30.00    Pack years: 15.00    Types: Cigarettes    Last attempt to quit: 12/29/2015  Years since quitting: 1.7  . Smokeless tobacco: Never Used  Substance and Sexual Activity  . Alcohol use: No    Alcohol/week: 0.0 oz  . Drug use: Yes    Types: Marijuana    Comment: daily use  . Sexual activity: Not Currently    Birth control/protection: Condom  Lifestyle  . Physical activity:    Days per week: Not on file    Minutes per session: Not on file  . Stress: Not on file  Relationships  . Social connections:    Talks on phone: Not on file    Gets together: Not on file    Attends religious service: Not on file    Active member of club or organization: Not on file    Attends meetings of clubs or organizations: Not on file    Relationship status:  Not on file  Other Topics Concern  . Not on file  Social History Narrative  . Not on file    Allergies:  Allergies  Allergen Reactions  . Hydrocodone Hives    Metabolic Disorder Labs: Lab Results  Component Value Date   HGBA1C 6.0 (H) 09/01/2017   MPG 123 (H) 03/03/2014   No results found for: PROLACTIN Lab Results  Component Value Date   CHOL 220 (H) 09/01/2017   TRIG 130 09/01/2017   HDL 56 09/01/2017   CHOLHDL 3.9 09/01/2017   VLDL 11 11/12/2015   LDLCALC 138 (H) 09/01/2017   LDLCALC 74 11/12/2015   Lab Results  Component Value Date   TSH 0.403 (L) 09/01/2017   TSH 0.554 11/12/2015    Therapeutic Level Labs: No results found for: LITHIUM No results found for: VALPROATE No components found for:  CBMZ  Current Medications: Current Outpatient Medications  Medication Sig Dispense Refill  . aspirin EC 81 MG tablet Take 81 mg by mouth.    Marland Kitchen atorvastatin (LIPITOR) 80 MG tablet Take 1 tablet (80 mg total) by mouth daily at 6 PM. For high cholesterol 90 tablet 3  . cyclobenzaprine (FLEXERIL) 10 MG tablet Take 0.5-1 tablets (5-10 mg total) by mouth 3 (three) times daily as needed. 30 tablet 0  . hydrochlorothiazide (HYDRODIURIL) 25 MG tablet Take 1 tablet (25 mg total) by mouth daily. 30 tablet 1  . isosorbide mononitrate (IMDUR) 30 MG 24 hr tablet Take 1 tablet (30 mg total) by mouth daily. 30 tablet 0  . metoprolol succinate (TOPROL-XL) 25 MG 24 hr tablet Take 25 mg by mouth daily.    . mometasone-formoterol (DULERA) 100-5 MCG/ACT AERO Inhale 2 puffs into the lungs 2 (two) times daily. 13 g 1  . nitroGLYCERIN (NITROSTAT) 0.4 MG SL tablet Place 1 tablet (0.4 mg total) under the tongue every 5 (five) minutes as needed for chest pain. 30 tablet 0  . traMADol (ULTRAM) 50 MG tablet Take 0.5-1.5 tablets (25-75 mg total) by mouth every 8 (eight) hours as needed. 30 tablet 0   No current facility-administered medications for this visit.      Musculoskeletal: Strength &  Muscle Tone: within normal limits Gait & Station: normal Patient leans: N/A  Psychiatric Specialty Exam: ROS  Blood pressure (!) 161/93, pulse 83, height 5\' 5"  (1.651 m), weight 156 lb (70.8 kg), SpO2 97 %.Body mass index is 25.96 kg/m.  General Appearance: Casual and Well Groomed  Eye Contact:  Good  Speech:  Clear and Coherent and Normal Rate  Volume:  Normal  Mood:  Euthymic  Affect:  Appropriate and Congruent  Thought Process:  Goal Directed and Descriptions of Associations: Intact  Orientation:  Full (Time, Place, and Person)  Thought Content: Logical   Suicidal Thoughts:  No  Homicidal Thoughts:  No  Memory:  Immediate;   Good  Judgement:  Fair  Insight:  Fair  Psychomotor Activity:  Normal  Concentration:  Concentration: Good  Recall:  Good  Fund of Knowledge: Good  Language: Good  Akathisia:  Negative  Handed:  Right  AIMS (if indicated): not done  Assets:  Communication Skills Desire for Improvement Financial Resources/Insurance Housing  ADL's:  Intact  Cognition: WNL  Sleep:  Poor   Screenings: AIMS     [facility]  AIMS Total Score  0  0    AUDIT     [facility]) [facility]  Alcohol Use Disorder Identification Test Final Score (AUDIT)  36  10    GAD-7     Office Visit from 10/14/2015 in [facility] from [facility]14[facility]2015 [facility]  Total GAD-7 Score  20  20    PHQ2-9     Office Visit from 09/06/2017 in Primary Care at [facility] Visit from 09/01/2017 in Primary Care at [facility] Visit from 02/07/2017 in Primary Care at [facility] Visit from 04/07/2016 in Primary Care at  [facility] Visit from 03/24/2016 in Primary Care at [place]  PHQ-2 Total Score  0  0  0  0  0       Assessment and Plan:  CUTLER SUNDAY is a 47 year old male with major depressive disorder, and unspecified mood lability.  Presents today after not being seen in nearly 1 year.  He was previously seen by Dr. Lolly Mustache for psychiatric medication management.  He had been out of treatment because he ran out of health insurance and was unable to afford the care.  He has a history of ACI, so we discussed cardiac safe medications.  We agreed to proceed with Zoloft titration and Remeron nightly as needed for sleep.  1. Major depressive disorder, recurrent episode, moderate (HCC)     Status of current problems: New to [employer] Ordered: No orders of the defined types were placed in this encounter.   Labs Reviewed: NA  Collateral Obtained/Records Reviewed: Girlfriend is here and able to corroborate his irritability and sensitivity, poor frustration tolerance, poor sleep  Plan:  Initiate Remeron 15 mg nightly for poor sleep and low appetite Cautioned patient regarding the weight gain associated with Remeron Initiate Zoloft 50 mg daily for 1 week, then increase to 100 mg Follow-up in 4 weeks  Burnard Leigh, MD 09/26/2017, 4:03 PM

## 2017-10-24 ENCOUNTER — Encounter (HOSPITAL_COMMUNITY): Payer: Self-pay | Admitting: Psychiatry

## 2017-10-24 ENCOUNTER — Ambulatory Visit (INDEPENDENT_AMBULATORY_CARE_PROVIDER_SITE_OTHER): Payer: BLUE CROSS/BLUE SHIELD | Admitting: Psychiatry

## 2017-10-24 DIAGNOSIS — F1290 Cannabis use, unspecified, uncomplicated: Secondary | ICD-10-CM

## 2017-10-24 DIAGNOSIS — F331 Major depressive disorder, recurrent, moderate: Secondary | ICD-10-CM | POA: Diagnosis not present

## 2017-10-24 DIAGNOSIS — Z87891 Personal history of nicotine dependence: Secondary | ICD-10-CM

## 2017-10-24 DIAGNOSIS — Z811 Family history of alcohol abuse and dependence: Secondary | ICD-10-CM | POA: Diagnosis not present

## 2017-10-24 MED ORDER — SERTRALINE HCL 100 MG PO TABS: 100.0000 mg | ORAL_TABLET | Freq: Every day | ORAL | 1 refills | Status: DC

## 2017-10-24 MED ORDER — MIRTAZAPINE 15 MG PO TABS: 15.0000 mg | ORAL_TABLET | Freq: Every day | ORAL | 0 refills | Status: DC

## 2017-10-24 NOTE — Patient Instructions (Signed)
[email or web address] [address] [place], [place] [address] Phone - [phone] Fax - [phone] 

## 2017-10-24 NOTE — Progress Notes (Signed)
BH MD/PA/NP OP Progress Note  10/24/2017 2:08 PM Darren [name]  MRN:  [id number]  Chief Complaint: med check  HPI: Darren [name] dramatically improved on the Zoloft and Remeron combination.  He is tolerating both well, and feels like his irritability, temper, and anxiety are all well controlled.  We agreed to continue the current regimen.  He denies any acute safety concerns.  His wife is here and able to corroborate that her husband is back to normal and is his usual mellow self.  He is enjoying his work and able to compartmentalize better.  Sleeping very well at night.  We will follow-up in 2-3 months or sooner if needed  Visit Diagnosis:    ICD-10-CM   1. Major depressive disorder, recurrent episode, moderate (HCC) F33.1 sertraline (ZOLOFT) 100 MG tablet    mirtazapine (REMERON) 15 MG tablet    Past Psychiatric History: See intake H&P for full details. Reviewed, with no updates at this time.  Past Medical History:  Past Medical History:  Diagnosis Date  . Anxiety   . Aortic insufficiency    moderate by echo 04/2014  . Coronary artery disease 03/24/2014   NSTEMI s/p PCI of mid LAD and repeat cath with patent stent  . Depression   . Hypertension   . Tobacco abuse     Past Surgical History:  Procedure Laterality Date  . CARDIAC CATHETERIZATION  03/04/2014   PCI of mid LAD  . CARDIAC CATHETERIZATION N/A 11/12/2015   Procedure: Left Heart Cath and Coronary Angiography;  Surgeon: Tonny Bollman, MD;  Location: [facility] CV [facility];  Service: Cardiovascular;  Laterality: N/A;  . LEFT HEART CATHETERIZATION WITH CORONARY ANGIOGRAM Bilateral 03/04/2014   Procedure: LEFT HEART CATHETERIZATION WITH CORONARY ANGIOGRAM;  Surgeon: Kathleene Hazel, MD;  Location: [facility] CATH LAB;  Service: Cardiovascular;  Laterality: Bilateral;  . LEFT HEART CATHETERIZATION WITH CORONARY ANGIOGRAM N/A 03/26/2014   Procedure: LEFT HEART CATHETERIZATION WITH CORONARY ANGIOGRAM;  Surgeon: Lennette Bihari, MD;   Location: [facility] CATH LAB;  Service: Cardiovascular;  Laterality: N/A;    Family Psychiatric History: See intake H&P for full details. Reviewed, with no updates at this time.   Family History:  Family History  Problem Relation Age of Onset  . Hypertension Mother   . Hypertension Father   . Alcohol abuse Father   . Hypertension Other   . Diabetes Other   . Heart attack Other     Social History:  Social History   Socioeconomic History  . Marital status: Legally Separated    Spouse name: Not on file  . Number of children: Not on file  . Years of education: Not on file  . Highest education level: Not on file  Occupational History  . Not on file  Social Needs  . Financial resource strain: Not on file  . Food insecurity:    Worry: Not on file    Inability: Not on file  . Transportation needs:    Medical: Not on file    Non-medical: Not on file  Tobacco Use  . Smoking status: Former Smoker    Packs/day: 0.50    Years: 30.00    Pack years: 15.00    Types: Cigarettes    Last attempt to quit: 12/29/2015    Years since quitting: 1.8  . Smokeless tobacco: Never Used  Substance and Sexual Activity  . Alcohol use: No    Alcohol/week: 0.0 standard drinks  . 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 Sexual activity: Not Currently    Birth control/protection: Condom  Lifestyle  . Physical activity:    Days per week: Not on file    Minutes per session: Not on file  . Stress: Not on file  Relationships  . Social connections:    Talks on phone: Not on file    Gets together: Not on file    Attends religious service: Not on file    Active member of club or organization: Not on file    Attends meetings of clubs or organizations: Not on file    Relationship status: Not on file  Other Topics Concern  . Not on file  Social History Narrative  . Not on file    Allergies:  Allergies  Allergen Reactions  . Hydrocodone Hives    Metabolic Disorder Labs: Lab  Results  Component Value Date   HGBA1C 6.0 (H) 09/01/2017   MPG 123 (H) 03/03/2014   No results found for: PROLACTIN Lab Results  Component Value Date   CHOL 220 (H) 09/01/2017   TRIG 130 09/01/2017   HDL 56 09/01/2017   CHOLHDL 3.9 09/01/2017   VLDL 11 11/12/2015   LDLCALC 138 (H) 09/01/2017   LDLCALC 74 11/12/2015   Lab Results  Component Value Date   TSH 0.403 (L) 09/01/2017   TSH 0.554 11/12/2015    Therapeutic Level Labs: No results found for: LITHIUM No results found for: VALPROATE No components found for:  CBMZ  Current Medications: Current Outpatient Medications  Medication Sig Dispense Refill  . aspirin EC 81 MG tablet Take 81 mg by mouth.    . cyclobenzaprine (FLEXERIL) 10 MG tablet Take 0.5-1 tablets (5-10 mg total) by mouth 3 (three) times daily as needed. 30 tablet 0  . hydrochlorothiazide (HYDRODIURIL) 25 MG tablet Take 1 tablet (25 mg total) by mouth daily. 30 tablet 1  . isosorbide mononitrate (IMDUR) 30 MG 24 hr tablet Take 1 tablet (30 mg total) by mouth daily. 30 tablet 0  . metoprolol succinate (TOPROL-XL) 25 MG 24 hr tablet Take 25 mg by mouth daily.    . mirtazapine (REMERON) 15 MG tablet Take 1 tablet (15 mg total) by mouth at bedtime. 90 tablet 0  . mometasone-formoterol (DULERA) 100-5 MCG/ACT AERO Inhale 2 puffs into the lungs 2 (two) times daily. 13 g 1  . nitroGLYCERIN (NITROSTAT) 0.4 MG SL tablet Place 1 tablet (0.4 mg total) under the tongue every 5 (five) minutes as needed for chest pain. 30 tablet 0  . sertraline (ZOLOFT) 100 MG tablet Take 1 tablet (100 mg total) by mouth daily. 90 tablet 1  . atorvastatin (LIPITOR) 80 MG tablet Take 1 tablet (80 mg total) by mouth daily at 6 PM. For high cholesterol (Patient not taking: Reported on 10/24/2017) 90 tablet 3   No current facility-administered medications for this visit.      Musculoskeletal: Strength & Muscle Tone: within normal limits Gait & Station: normal Patient leans:  N/A  Psychiatric Specialty Exam: ROS  Blood pressure (!) 143/76, pulse 74, height 5\' 5"  (1.651 m), weight 158 lb (71.7 kg), SpO2 98 %.Body mass index is 26.29 kg/m.  General Appearance: Casual and Well Groomed  Eye Contact:  Good  Speech:  Clear and Coherent and Normal Rate  Volume:  Normal  Mood:  Euthymic and much better  Affect:  Appropriate and Congruent  Thought Process:  Goal Directed and Descriptions of Associations: Intact  Orientation:  Full (Time, Place, and Person)  Thought Content: Logical   Suicidal Thoughts:  No  Homicidal Thoughts:  No  Memory:  Immediate;   Good  Judgement:  Fair  Insight:  Fair  Psychomotor Activity:  Normal  Concentration:  Concentration: Good  Recall:  Good  Fund of Knowledge: Good  Language: Good  Akathisia:  Negative  Handed:  Right  AIMS (if indicated): not done  Assets:  Communication Skills Desire for Improvement Financial Resources/Insurance Housing  ADL's:  Intact  Cognition: WNL  Sleep:  Good   Screenings: AIMS     [facility]  AIMS Total Score  0  0    AUDIT     [facility]) [facility] Ad[facility] ([facility]  Alcohol Use Disorder Identification Test Final Score (AUDIT)  36  10    GAD-7     Office Visit from 10/14/2015 in [facility] from 04/14/2015 in [facility] [place]  Total GAD-7 Score  20  20    PHQ2-9     Office Visit from 09/06/2017 in Primary Care at [facility] Visit from 09/01/2017 in Primary Care at [facility] Visit from 02/07/2017 in Primary Care at [facility] Visit from 04/07/2016 in Primary Care at [facility] Visit from 03/24/2016 in Primary Care at [facility]   PHQ-2 Total Score  0  0  0  0  0       Assessment and Plan:  Darren [name] presents with substantial improvement over the past month, and good tolerability of Zoloft and Remeron.  We will continue as below and follow-up in 3 months. No acute safety issues at this time.  1. Major depressive disorder, recurrent episode, moderate (HCC)     Status of current problems: New to [employer] Ordered: No orders of the defined types were placed in this encounter.   Labs Reviewed: NA  Collateral Obtained/Records Reviewed: Girlfriend is here and able to corroborate his irritability and sensitivity, poor frustration tolerance, poor sleep  Plan:  Remeron 15 mg nightly for poor sleep and low appetite Zoloft 100 mg daily rtc 3 months  Burnard Leigh, MD 10/24/2017, 2:08 PM

## 2017-10-25 ENCOUNTER — Encounter: Payer: Self-pay | Admitting: Family Medicine

## 2017-10-25 ENCOUNTER — Ambulatory Visit: Payer: BLUE CROSS/BLUE SHIELD | Admitting: Family Medicine

## 2017-10-25 ENCOUNTER — Other Ambulatory Visit: Payer: Self-pay

## 2017-10-25 VITALS — BP 110/72 | HR 76 | Temp 98.2°F | Resp 18 | Ht 65.0 in | Wt 156.6 lb

## 2017-10-25 DIAGNOSIS — I10 Essential (primary) hypertension: Secondary | ICD-10-CM | POA: Diagnosis not present

## 2017-10-25 DIAGNOSIS — R7303 Prediabetes: Secondary | ICD-10-CM | POA: Diagnosis not present

## 2017-10-25 DIAGNOSIS — F331 Major depressive disorder, recurrent, moderate: Secondary | ICD-10-CM

## 2017-10-25 DIAGNOSIS — R7989 Other specified abnormal findings of blood chemistry: Secondary | ICD-10-CM | POA: Diagnosis not present

## 2017-10-25 MED ORDER — MIRTAZAPINE 15 MG PO TABS: 15.0000 mg | ORAL_TABLET | Freq: Every day | ORAL | 1 refills | Status: DC

## 2017-10-25 MED ORDER — MOMETASONE FURO-FORMOTEROL FUM 100-5 MCG/ACT IN AERO: 2.0000 | INHALATION_SPRAY | Freq: Two times a day (BID) | RESPIRATORY_TRACT | 1 refills | Status: DC

## 2017-10-25 MED ORDER — ATORVASTATIN CALCIUM 10 MG PO TABS: 10.0000 mg | ORAL_TABLET | Freq: Every day | ORAL | 3 refills | Status: DC

## 2017-10-25 MED ORDER — CYCLOBENZAPRINE HCL 10 MG PO TABS: 5.0000 mg | ORAL_TABLET | Freq: Three times a day (TID) | ORAL | 3 refills | Status: DC | PRN

## 2017-10-25 MED ORDER — HYDROCHLOROTHIAZIDE 25 MG PO TABS
25.0000 mg | ORAL_TABLET | Freq: Every day | ORAL | 1 refills | Status: DC
Start: 2017-10-25 — End: 2018-04-29

## 2017-10-25 MED ORDER — ALBUTEROL SULFATE HFA 108 (90 BASE) MCG/ACT IN AERS: 2.0000 | INHALATION_SPRAY | Freq: Four times a day (QID) | RESPIRATORY_TRACT | 1 refills | Status: DC | PRN

## 2017-10-25 NOTE — Patient Instructions (Addendum)
   If you have lab work done today you will be contacted with your lab results within the next 2 weeks.  If you have not heard from us then please contact us. The fastest way to get your results is to register for My Chart.   IF you received an x-ray today, you will receive an invoice from [facility]. Please contact [facility] at [phone] with questions or concerns regarding your invoice.   IF you received labwork today, you will receive an invoice from [facility]. Please contact [facility] at [phone] with questions or concerns regarding your invoice.   Our billing staff will not be able to assist you with questions regarding bills from these companies.  You will be contacted with the lab results as soon as they are available. The fastest way to get your results is to activate your My Chart account. Instructions are located on the last page of this paperwork. If you have not heard from us regarding the results in 2 weeks, please contact this office.     Hypertension Hypertension, commonly called high blood pressure, is when the force of blood pumping through the arteries is too strong. The arteries are the blood vessels that carry blood from the heart throughout the body. Hypertension forces the heart to work harder to pump blood and may cause arteries to become narrow or stiff. Having untreated or uncontrolled hypertension can cause heart attacks, strokes, kidney disease, and other problems. A blood pressure reading consists of a higher number over a lower number. Ideally, your blood pressure should be below 120/80. The first ("top") number is called the systolic pressure. It is a measure of the pressure in your arteries as your heart beats. The second ("bottom") number is called the diastolic pressure. It is a measure of the pressure in your arteries as the heart relaxes. What are the causes? The cause of this condition is not known. What increases the risk? Some  risk factors for high blood pressure are under your control. Others are not. Factors you can change  Smoking.  Having type 2 diabetes mellitus, high cholesterol, or both.  Not getting enough exercise or physical activity.  Being overweight.  Having too much fat, sugar, calories, or salt (sodium) in your diet.  Drinking too much alcohol. Factors that are difficult or impossible to change  Having chronic kidney disease.  Having a family history of high blood pressure.  Age. Risk increases with age.  Race. You may be at higher risk if you are African-American.  Gender. Men are at higher risk than women before age 45. After age 65, women are at higher risk than men.  Having obstructive sleep apnea.  Stress. What are the signs or symptoms? Extremely high blood pressure (hypertensive crisis) may cause:  Headache.  Anxiety.  Shortness of breath.  Nosebleed.  Nausea and vomiting.  Severe chest pain.  Jerky movements you cannot control (seizures).  How is this diagnosed? This condition is diagnosed by measuring your blood pressure while you are seated, with your arm resting on a surface. The cuff of the blood pressure monitor will be placed directly against the skin of your upper arm at the level of your heart. It should be measured at least twice using the same arm. Certain conditions can cause a difference in blood pressure between your right and left arms. Certain factors can cause blood pressure readings to be lower or higher than normal (elevated) for a short period of time:  When   your blood pressure is higher when you are in a health care provider's office than when you are at home, this is called white coat hypertension. Most people with this condition do not need medicines.  When your blood pressure is higher at home than when you are in a health care provider's office, this is called masked hypertension. Most people with this condition may need medicines to control  blood pressure.  If you have a high blood pressure reading during one visit or you have normal blood pressure with other risk factors:  You may be asked to return on a different day to have your blood pressure checked again.  You may be asked to monitor your blood pressure at home for 1 week or longer.  If you are diagnosed with hypertension, you may have other blood or imaging tests to help your health care provider understand your overall risk for other conditions. How is this treated? This condition is treated by making healthy lifestyle changes, such as eating healthy foods, exercising more, and reducing your alcohol intake. Your health care provider may prescribe medicine if lifestyle changes are not enough to get your blood pressure under control, and if:  Your systolic blood pressure is above 130.  Your diastolic blood pressure is above 80.  Your personal target blood pressure may vary depending on your medical conditions, your age, and other factors. Follow these instructions at home: Eating and drinking  Eat a diet that is high in fiber and potassium, and low in sodium, added sugar, and fat. An example eating plan is called the DASH (Dietary Approaches to Stop Hypertension) diet. To eat this way: ? Eat plenty of fresh fruits and vegetables. Try to fill half of your plate at each meal with fruits and vegetables. ? Eat whole grains, such as whole wheat pasta, brown rice, or whole grain bread. Fill about one quarter of your plate with whole grains. ? Eat or drink low-fat dairy products, such as skim milk or low-fat yogurt. ? Avoid fatty cuts of meat, processed or cured meats, and poultry with skin. Fill about one quarter of your plate with lean proteins, such as fish, chicken without skin, beans, eggs, and tofu. ? Avoid premade and processed foods. These tend to be higher in sodium, added sugar, and fat.  Reduce your daily sodium intake. Most people with hypertension should eat less  than 1,500 mg of sodium a day.  Limit alcohol intake to no more than 1 drink a day for nonpregnant women and 2 drinks a day for men. One drink equals 12 oz of beer, 5 oz of wine, or 1 oz of hard liquor. Lifestyle  Work with your health care provider to maintain a healthy body weight or to lose weight. Ask what an ideal weight is for you.  Get at least 30 minutes of exercise that causes your heart to beat faster (aerobic exercise) most days of the week. Activities may include walking, swimming, or biking.  Include exercise to strengthen your muscles (resistance exercise), such as pilates or lifting weights, as part of your weekly exercise routine. Try to do these types of exercises for 30 minutes at least 3 days a week.  Do not use any products that contain nicotine or tobacco, such as cigarettes and e-cigarettes. If you need help quitting, ask your health care provider.  Monitor your blood pressure at home as told by your health care provider.  Keep all follow-up visits as told by your health care provider.   This is important. Medicines  Take over-the-counter and prescription medicines only as told by your health care provider. Follow directions carefully. Blood pressure medicines must be taken as prescribed.  Do not skip doses of blood pressure medicine. Doing this puts you at risk for problems and can make the medicine less effective.  Ask your health care provider about side effects or reactions to medicines that you should watch for. Contact a health care provider if:  You think you are having a reaction to a medicine you are taking.  You have headaches that keep coming back (recurring).  You feel dizzy.  You have swelling in your ankles.  You have trouble with your vision. Get help right away if:  You develop a severe headache or confusion.  You have unusual weakness or numbness.  You feel faint.  You have severe pain in your chest or abdomen.  You vomit  repeatedly.  You have trouble breathing. Summary  Hypertension is when the force of blood pumping through your arteries is too strong. If this condition is not controlled, it may put you at risk for serious complications.  Your personal target blood pressure may vary depending on your medical conditions, your age, and other factors. For most people, a normal blood pressure is less than 120/80.  Hypertension is treated with lifestyle changes, medicines, or a combination of both. Lifestyle changes include weight loss, eating a healthy, low-sodium diet, exercising more, and limiting alcohol. This information is not intended to replace advice given to you by your health care provider. Make sure you discuss any questions you have with your health care provider. Document Released: 02/13/2005 Document Revised: 01/12/2016 Document Reviewed: 01/12/2016 Elsevier Interactive Patient Education  2018 [employer]  

## 2017-10-25 NOTE — Progress Notes (Signed)
Chief Complaint  Patient presents with  . Results    HERE TO DISCUSS TEST RESULTS     HPI  Hypertension: Patient here for follow-up of elevated blood pressure. He is adherent to low salt diet.  Blood pressure is well controlled at home. Cardiac symptoms none. Patient denies chest pain and chest pressure/discomfort.  Cardiovascular risk factors: dyslipidemia, family history of premature cardiovascular disease and hypertension. Use of agents associated with hypertension: none.   Taking lunch to work.   BP Readings from Last 3 Encounters:  10/25/17 110/72  09/06/17 134/76  09/01/17 (!) 169/80   Prediabetes He is working on weight loss He is also trying to get much better foods for his lunch Lab Results  Component Value Date   HGBA1C 6.0 (H) 09/01/2017   Wt Readings from Last 3 Encounters:  10/25/17 156 lb 9.6 oz (71 kg)  09/06/17 159 lb 9.6 oz (72.4 kg)  09/01/17 160 lb (72.6 kg)   Depression Patient reports that his mood is better now that he is sleeping better He denies SI/HI  No panic attacks Depression screen [facility] 2/9 09/06/2017 09/01/2017 02/07/2017 04/07/2016 03/24/2016  Decreased Interest 0 0 0 0 0  Down, Depressed, Hopeless 0 0 0 0 0  PHQ - 2 Score 0 0 0 0 0  Altered sleeping - - - - -  Tired, decreased energy - - - - -  Change in appetite - - - - -  Feeling bad or failure about yourself  - - - - -  Trouble concentrating - - - - -  Moving slowly or fidgety/restless - - - - -  Suicidal thoughts - - - - -  PHQ-9 Score - - - - -  Some encounter information is confidential and restricted. Go to Review Flowsheets activity to see all data.     Past Medical History:  Diagnosis Date  . Anxiety   . Aortic insufficiency    moderate by echo 04/2014  . Coronary artery disease 03/24/2014   NSTEMI s/p PCI of mid LAD and repeat cath with patent stent  . Depression   . Hypertension   . Tobacco abuse     Current Outpatient Medications  Medication Sig Dispense Refill  .  aspirin EC 81 MG tablet Take 81 mg by mouth.    . cyclobenzaprine (FLEXERIL) 10 MG tablet Take 0.5-1 tablets (5-10 mg total) by mouth 3 (three) times daily as needed. 30 tablet 3  . hydrochlorothiazide (HYDRODIURIL) 25 MG tablet Take 1 tablet (25 mg total) by mouth daily. 90 tablet 1  . mirtazapine (REMERON) 15 MG tablet Take 1 tablet (15 mg total) by mouth at bedtime. 90 tablet 1  . mometasone-formoterol (DULERA) 100-5 MCG/ACT AERO Inhale 2 puffs into the lungs 2 (two) times daily. 13 g 1  . nitroGLYCERIN (NITROSTAT) 0.4 MG SL tablet Place 1 tablet (0.4 mg total) under the tongue every 5 (five) minutes as needed for chest pain. 30 tablet 0  . sertraline (ZOLOFT) 100 MG tablet Take 1 tablet (100 mg total) by mouth daily. 90 tablet 1  . albuterol (PROVENTIL HFA;VENTOLIN HFA) 108 (90 Base) MCG/ACT inhaler Inhale 2 puffs into the lungs every 6 (six) hours as needed for wheezing or shortness of breath. 1 Inhaler 1  . atorvastatin (LIPITOR) 10 MG tablet Take 1 tablet (10 mg total) by mouth daily. 90 tablet 3   No current facility-administered medications for this visit.     Allergies:  Allergies  Allergen Reactions  .  Hydrocodone Hives    Past Surgical History:  Procedure Laterality Date  . CARDIAC CATHETERIZATION  03/04/2014   PCI of mid LAD  . CARDIAC CATHETERIZATION N/A 11/12/2015   Procedure: Left Heart Cath and Coronary Angiography;  Surgeon: Tonny BollmanMichael Cooper, MD;  [facility];  Service: Cardiovascular;  Laterality: N/A;  . LEFT HEART CATHETERIZATION WITH CORONARY ANGIOGRAM Bilateral 03/04/2014   Procedure: LEFT HEART CATHETERIZATION WITH CORONARY ANGIOGRAM;  Surgeon: Kathleene Hazelhristopher D McAlhany, MD;  [facility];  Service: Cardiovascular;  Laterality: Bilateral;  . LEFT HEART CATHETERIZATION WITH CORONARY ANGIOGRAM N/A 03/26/2014   Procedure: LEFT HEART CATHETERIZATION WITH CORONARY ANGIOGRAM;  Surgeon: Lennette Biharihomas A Kelly, MD;  [facility];  Service: Cardiovascular;   Laterality: N/A;    Social History   Socioeconomic History  . Marital status: Legally Separated    Spouse name: Not on file  . Number of children: Not on file  . Years of education: Not on file  . Highest education level: Not on file  Occupational History  . Not on file  Social Needs  . Financial resource strain: Not on file  . Food insecurity:    Worry: Not on file    Inability: Not on file  . Transportation needs:    Medical: Not on file    Non-medical: Not on file  Tobacco Use  . Smoking status: Former Smoker    Packs/day: 0.50    Years: 30.00    Pack years: 15.00    Types: Cigarettes    Last attempt to quit: 12/29/2015    Years since quitting: 2.1  . Smokeless tobacco: Never Used  Substance and Sexual Activity  . Alcohol use: No    Alcohol/week: 0.0 standard drinks  . Drug use: Yes    Types: Marijuana    Comment: daily use  . Sexual activity: Not Currently    Birth control/protection: Condom  Lifestyle  . Physical activity:    Days per week: Not on file    Minutes per session: Not on file  . Stress: Not on file  Relationships  . Social connections:    Talks on phone: Not on file    Gets together: Not on file    Attends religious service: Not on file    Active member of club or organization: Not on file    Attends meetings of clubs or organizations: Not on file    Relationship status: Not on file  Other Topics Concern  . Not on file  Social History Narrative  . Not on file    Family History  Problem Relation Age of Onset  . Hypertension Mother   . Hypertension Father   . Alcohol abuse Father   . Hypertension Other   . Diabetes Other   . Heart attack Other      ROS Review of Systems See HPI Constitution: No fevers or chills No malaise No diaphoresis Skin: No rash or itching Eyes: no blurry vision, no double vision GU: no dysuria or hematuria Neuro: no dizziness or headaches all others reviewed and negative   Objective: Vitals:    10/25/17 1210  BP: 110/72  Pulse: 76  Resp: 18  Temp: 98.2 F (36.8 C)  TempSrc: Oral  SpO2: 99%  Weight: 156 lb 9.6 oz (71 kg)  Height: 5\' 5"  (1.651 m)    Physical Exam Physical Exam  Constitutional: Oriented to person, place, and time. Appears well-developed and well-nourished.  HENT:  Head: Normocephalic and atraumatic.  Eyes:  Conjunctivae and EOM are normal.  Cardiovascular: Normal rate, regular rhythm, normal heart sounds and intact distal pulses.  No murmur heard. Pulmonary/Chest: Effort normal and breath sounds normal. No stridor. No respiratory distress. Has no wheezes.  Neurological: Is alert and oriented to person, place, and time.  Skin: Skin is warm. Capillary refill takes less than 2 seconds.  Psychiatric: Has a normal mood and affect. Behavior is normal. Judgment and thought content normal.    Assessment and Plan Rawley was seen today for results.  Diagnoses and all orders for this visit:  Abnormal TSH- will reassess  -     TSH + free T4  Major depressive disorder, recurrent episode, moderate (HCC) - depression improved, cpm -     mirtazapine (REMERON) 15 MG tablet; Take 1 tablet (15 mg total) by mouth at bedtime.  Essential hypertension - Patient's blood pressure is at goal of 139/89 or less. Condition is stable. Continue current medications and treatment plan. I recommend that you exercise for 30-45 minutes 5 days a week. I also recommend a balanced diet with fruits and vegetables every day, lean meats, and little fried foods. The DASH diet (you can find this online) is a good example of this.  -     hydrochlorothiazide (HYDRODIURIL) 25 MG tablet; Take 1 tablet (25 mg total) by mouth daily.  Prediabetes- continue lifestyle modification   Other orders -     atorvastatin (LIPITOR) 10 MG tablet; Take 1 tablet (10 mg total) by mouth daily. -     mometasone-formoterol (DULERA) 100-5 MCG/ACT AERO; Inhale 2 puffs into the lungs 2 (two) times daily. -      albuterol (PROVENTIL HFA;VENTOLIN HFA) 108 (90 Base) MCG/ACT inhaler; Inhale 2 puffs into the lungs every 6 (six) hours as needed for wheezing or shortness of breath. -     cyclobenzaprine (FLEXERIL) 10 MG tablet; Take 0.5-1 tablets (5-10 mg total) by mouth 3 (three) times daily as needed.     Prince Olivier A Murel Wigle

## 2017-10-26 LAB — TSH+FREE T4
Free T4: 1.23 ng/dL (ref 0.82–1.77)
TSH: 0.565 u[IU]/mL (ref 0.450–4.500)

## 2018-02-07 DIAGNOSIS — F3342 Major depressive disorder, recurrent, in full remission: Secondary | ICD-10-CM | POA: Diagnosis not present

## 2018-02-27 ENCOUNTER — Other Ambulatory Visit: Payer: Self-pay

## 2018-02-27 ENCOUNTER — Emergency Department (HOSPITAL_COMMUNITY)
Admission: EM | Admit: 2018-02-27 | Discharge: 2018-02-27 | Disposition: A | Discharge status: Home / Self Care | Payer: BLUE CROSS/BLUE SHIELD | Attending: Emergency Medicine | Admitting: Emergency Medicine

## 2018-02-27 ENCOUNTER — Encounter (HOSPITAL_COMMUNITY): Payer: Self-pay | Admitting: Emergency Medicine

## 2018-02-27 DIAGNOSIS — I10 Essential (primary) hypertension: Secondary | ICD-10-CM | POA: Insufficient documentation

## 2018-02-27 DIAGNOSIS — K0889 Other specified disorders of teeth and supporting structures: Secondary | ICD-10-CM

## 2018-02-27 DIAGNOSIS — K029 Dental caries, unspecified: Secondary | ICD-10-CM

## 2018-02-27 DIAGNOSIS — Z79899 Other long term (current) drug therapy: Secondary | ICD-10-CM | POA: Insufficient documentation

## 2018-02-27 DIAGNOSIS — Z87891 Personal history of nicotine dependence: Secondary | ICD-10-CM | POA: Diagnosis not present

## 2018-02-27 DIAGNOSIS — Z955 Presence of coronary angioplasty implant and graft: Secondary | ICD-10-CM | POA: Diagnosis not present

## 2018-02-27 DIAGNOSIS — Z7982 Long term (current) use of aspirin: Secondary | ICD-10-CM | POA: Insufficient documentation

## 2018-02-27 DIAGNOSIS — I2510 Atherosclerotic heart disease of native coronary artery without angina pectoris: Secondary | ICD-10-CM | POA: Diagnosis not present

## 2018-02-27 MED ORDER — CLINDAMYCIN HCL 150 MG PO CAPS: 150.0000 mg | ORAL_CAPSULE | Freq: Four times a day (QID) | ORAL | 0 refills | Status: DC

## 2018-02-27 MED ORDER — OXYCODONE-ACETAMINOPHEN 5-325 MG PO TABS
2.0000 | ORAL_TABLET | Freq: Once | ORAL | Status: AC
  Administered 2018-02-27: 2 via ORAL
  Filled 2018-02-27: qty 2

## 2018-02-27 NOTE — ED Notes (Signed)
Patient provided heat pack to apply to right side of face.

## 2018-02-27 NOTE — ED Triage Notes (Signed)
Pt complaint of right lower dental pain worsening over past week; taking medication per dentist prescribed.

## 2018-02-27 NOTE — ED Provider Notes (Signed)
[facility]-EMERGENCY DEPT Provider Note   CSN: [id number] Arrival date & time: 02/27/18  1735     History   Chief Complaint Chief Complaint  Patient presents with  . Dental Pain    [name] is a 48 y.o. male.  HPI 48 year old male presents today complaining of right lower dental pain.  This began before Christmas.  He was seen by his dentist and has a caries in the lower second molar.  He was started on amoxicillin and has been taking ibuprofen.  He continues to have pain.  Plan is to have the [facility] cared for after the pain has improved.  He denies neck pain, fever, chills, or vomiting.  He has had some nausea. Past Medical History:  Diagnosis Date  . Anxiety   . Aortic insufficiency    moderate by echo 04/2014  . Coronary artery disease 03/24/2014   NSTEMI s/p PCI of mid LAD and repeat cath with patent stent  . Depression   . Hypertension   . Tobacco abuse     Patient Active Problem List   Diagnosis Date Noted  . Alcohol dependence in remission (HCC) 09/01/2017  . Major depressive disorder, recurrent episode, moderate (HCC) 09/01/2017  . Dyslipidemia 02/01/2017  . Coronary artery disease involving native heart 02/01/2017  . Chest pain in adult 02/01/2017  . Hypertension 02/01/2017  . Pulmonary emphysema (HCC) 03/24/2016  . Angina pectoris (HCC) 11/11/2015  . Aortic insufficiency   . MDD (major depressive disorder) 08/06/2014  . ETOH abuse 08/06/2014  . Depression   . Coronary artery disease due to lipid rich plaque   . Essential hypertension 03/04/2014  . Tobacco abuse 03/04/2014    Past Surgical History:  Procedure Laterality Date  . CARDIAC CATHETERIZATION  03/04/2014   PCI of mid LAD  . CARDIAC CATHETERIZATION N/A 11/12/2015   Procedure: Left Heart Cath and Coronary Angiography;  Surgeon: Tonny Bollman, MD;  Location[facility] INVASIVE CV LAB;  Service: Cardiovascular;  Laterality: N/A;  . LEFT HEART CATHETERIZATION WITH CORONARY  ANGIOGRAM Bilateral 03/04/2014   Procedure: LEFT HEART CATHETERIZATION WITH CORONARY ANGIOGRAM;  Surgeon: Kathleene Hazel, MD;  Location: [place] CATH LAB;  Service: Cardiovascular;  Laterality: Bilateral;  . LEFT HEART CATHETERIZATION WITH CORONARY ANGIOGRAM N/A 03/26/2014   Procedure: LEFT HEART CATHETERIZATION WITH CORONARY ANGIOGRAM;  Surgeon: Lennette Bihari, MD;  Location[facility] CATH LAB;  Service: Cardiovascular;  Laterality: N/A;        Home Medications    Prior to Admission medications   Medication Sig Start Date End Date Taking? Authorizing Provider  albuterol (PROVENTIL HFA;VENTOLIN HFA) 108 (90 Base) MCG/ACT inhaler Inhale 2 puffs into the lungs every 6 (six) hours as needed for wheezing or shortness of breath. 10/25/17  Yes Stallings, Zoe A, MD  amoxicillin (AMOXIL) 500 MG capsule Take 500 mg by mouth 3 (three) times daily.   Yes [provider]  aspirin EC 81 MG tablet Take 81 mg by mouth.   Yes [provider]  cyclobenzaprine (FLEXERIL) 10 MG tablet Take 0.5-1 tablets (5-10 mg total) by mouth 3 (three) times daily as needed. 10/25/17  Yes Stallings, Zoe A, MD  hydrochlorothiazide (HYDRODIURIL) 25 MG tablet Take 1 tablet (25 mg total) by mouth daily. 10/25/17  Yes Doristine Bosworth, MD  ibuprofen (ADVIL,MOTRIN) 800 MG tablet Take 800 mg by mouth every 6 (six) hours as needed for fever or moderate pain.   Yes [provider]  nitroGLYCERIN (NITROSTAT) 0.4 MG SL  tablet Place 1 tablet (0.4 mg total) under the tongue every 5 (five) minutes as needed for chest pain. 09/01/17  Yes Collie SiadStallings, Zoe A, MD  sertraline (ZOLOFT) 100 MG tablet Take 1 tablet (100 mg total) by mouth daily. Patient taking differently: Take 200 mg by mouth daily.  10/24/17 10/24/18 Yes Eksir, Bo McclintockAlexander Arya, MD  atorvastatin (LIPITOR) 10 MG tablet Take 1 tablet (10 mg total) by mouth daily. Patient not taking: Reported on 02/27/2018 10/25/17   Doristine BosworthStallings, Zoe A, MD  mirtazapine (REMERON) 15 MG tablet  Take 1 tablet (15 mg total) by mouth at bedtime. Patient not taking: Reported on 02/27/2018 10/25/17 10/25/18  Doristine BosworthStallings, Zoe A, MD  mometasone-formoterol (DULERA) 100-5 MCG/ACT AERO Inhale 2 puffs into the lungs 2 (two) times daily. Patient not taking: Reported on 02/27/2018 10/25/17 10/25/18  Doristine BosworthStallings, Zoe A, MD    Family History Family History  Problem Relation Age of Onset  . Hypertension Mother   . Hypertension Father   . Alcohol abuse Father   . Hypertension Other   . Diabetes Other   . Heart attack Other     Social History Social History   Tobacco Use  . Smoking status: Former Smoker    Packs/day: 0.50    Years: 30.00    Pack years: 15.00    Types: Cigarettes    Last attempt to quit: 12/29/2015    Years since quitting: 2.1  . Smokeless tobacco: Never Used  Substance Use Topics  . Alcohol use: No    Alcohol/week: 0.0 standard drinks  . Drug use: Yes    Types: Marijuana    Comment: daily use     Allergies   Hydrocodone   Review of Systems Review of Systems  All other systems reviewed and are negative.    Physical Exam Updated Vital Signs BP (!) 187/89 (BP Location: Left Arm) Comment: compliant with BP meds per pt  Pulse 70   Temp 98.8 F (37.1 C) (Oral)   Resp 16   Ht 1.702 m (5\' 7" )   Wt 72.8 kg   SpO2 97%   BMI 25.14 kg/m   Physical Exam Vitals signs and nursing note reviewed.  Constitutional:      Appearance: He is well-developed.  HENT:     Head: Normocephalic and atraumatic.     Right Ear: External ear normal.     Left Ear: External ear normal.     Nose: Nose normal.     Mouth/Throat:     Mouth: Mucous membranes are moist.     Comments: Right lower second molar with [place] and tenderness No peri-dental swelling Floor of mouth is normal There is some shotty adenopathy Neck:     Trachea: No tracheal deviation.  Pulmonary:     Effort: Pulmonary effort is normal.  Musculoskeletal: Normal range of motion.  Skin:    General: Skin is warm and  dry.  Neurological:     Mental Status: He is alert and oriented to person, place, and time.  Psychiatric:        Behavior: Behavior normal.      ED Treatments / Results  Labs (all labs ordered are listed, but only abnormal results are displayed) Labs Reviewed - No data to display  EKG None  Radiology No results found.  Procedures Procedures (including critical care time)  Medications Ordered in ED Medications - No data to display   Initial Impression / Assessment and Plan / ED Course  I have reviewed the triage vital  signs and the nursing notes.  Pertinent labs & imaging results that were available during my care of the patient were reviewed by me and considered in my medical decision making (see chart for details).    With dental Georga Hacking and has been on amoxicillin for several days without relief.  Plan Percocet here in ED.  Changed to clindamycin.  He will continue ibuprofen and acetaminophen as needed for pain outpatient follow-up with his dentist. Final Clinical Impressions(s) / ED Diagnoses   Final diagnoses:  Pain, dental  Dental caries    ED Discharge Orders    None       Margarita Grizzle, MD 02/27/18 2125

## 2018-04-28 ENCOUNTER — Encounter: Payer: Self-pay | Admitting: Family Medicine

## 2018-04-29 ENCOUNTER — Other Ambulatory Visit: Payer: Self-pay | Admitting: *Deleted

## 2018-04-29 DIAGNOSIS — I10 Essential (primary) hypertension: Secondary | ICD-10-CM

## 2018-04-29 MED ORDER — HYDROCHLOROTHIAZIDE 25 MG PO TABS: 25.0000 mg | ORAL_TABLET | Freq: Every day | ORAL | 0 refills | Status: DC

## 2018-05-17 ENCOUNTER — Encounter: Payer: BLUE CROSS/BLUE SHIELD | Admitting: Family Medicine

## 2018-05-23 DIAGNOSIS — F3342 Major depressive disorder, recurrent, in full remission: Secondary | ICD-10-CM | POA: Diagnosis not present

## 2018-06-14 ENCOUNTER — Encounter: Payer: Self-pay | Admitting: Family Medicine

## 2018-06-24 ENCOUNTER — Other Ambulatory Visit: Payer: Self-pay

## 2018-06-24 DIAGNOSIS — I10 Essential (primary) hypertension: Secondary | ICD-10-CM

## 2018-06-24 DIAGNOSIS — R7989 Other specified abnormal findings of blood chemistry: Secondary | ICD-10-CM

## 2018-06-24 DIAGNOSIS — R7303 Prediabetes: Secondary | ICD-10-CM

## 2018-06-24 DIAGNOSIS — Z0000 Encounter for general adult medical examination without abnormal findings: Secondary | ICD-10-CM

## 2018-07-02 ENCOUNTER — Other Ambulatory Visit: Payer: Self-pay

## 2018-07-02 ENCOUNTER — Telehealth: Payer: BLUE CROSS/BLUE SHIELD | Admitting: Family Medicine

## 2018-07-12 ENCOUNTER — Encounter: Payer: BLUE CROSS/BLUE SHIELD | Admitting: Family Medicine

## 2018-07-31 ENCOUNTER — Other Ambulatory Visit: Payer: Self-pay | Admitting: Family Medicine

## 2018-07-31 DIAGNOSIS — I10 Essential (primary) hypertension: Secondary | ICD-10-CM

## 2018-07-31 NOTE — Telephone Encounter (Signed)
LOV  06-24-2018 / Will refill per protocol

## 2018-09-28 DEATH — deceased

## 2018-11-14 ENCOUNTER — Other Ambulatory Visit: Payer: Self-pay | Admitting: Family Medicine

## 2018-11-14 DIAGNOSIS — I10 Essential (primary) hypertension: Secondary | ICD-10-CM

## 2018-11-14 NOTE — Telephone Encounter (Signed)
Requested medication (s) are due for refill today: yes  Requested medication (s) are on the active medication list: yes  Last refill:  08/01/2018  Future visit scheduled: no  Notes to clinic:  Review for refill   Requested Prescriptions  Pending Prescriptions Disp Refills   hydrochlorothiazide (HYDRODIURIL) 25 MG tablet [Pharmacy Med Name: hydroCHLOROthiazide 25 MG Oral Tablet] 90 tablet 0    Sig: Take 1 tablet by mouth once daily     Cardiovascular: Diuretics - Thiazide Failed - 11/14/2018  8:13 AM      Failed - Ca in normal range and within 360 days    Calcium  Date Value Ref Range Status  09/01/2017 9.2 8.7 - 10.2 mg/dL Final         Failed - Cr in normal range and within 360 days    Creatinine, Ser  Date Value Ref Range Status  09/01/2017 1.08 0.76 - 1.27 mg/dL Final         Failed - K in normal range and within 360 days    Potassium  Date Value Ref Range Status  09/01/2017 4.6 3.5 - 5.2 mmol/L Final         Failed - Na in normal range and within 360 days    Sodium  Date Value Ref Range Status  09/01/2017 137 134 - 144 mmol/L Final         Failed - Last BP in normal range    BP Readings from Last 1 Encounters:  02/27/18 (!) 187/88         Failed - Valid encounter within last 6 months    Recent Outpatient Visits          1 year ago Abnormal TSH   [facility], Arlie Solomons, MD   1 year ago Acute non intractable tension-type headache   [facility] at [place], PA-C   1 year ago Annual physical exam   [facility] at [facility], Zoe A, MD   1 year ago Coronary artery disease due to lipid rich plaque   [facility] at [facility], Arlie Solomons, MD   2 years ago Encounter for medication refill   [facility] at [facility], Arlie Solomons, MD

## 2018-11-15 NOTE — Telephone Encounter (Signed)
Spoke with patient to schedule appt for med refills. Pt will call back to schedule the appt

## 2018-12-04 ENCOUNTER — Encounter: Payer: Self-pay | Admitting: Family Medicine

## 2018-12-04 ENCOUNTER — Other Ambulatory Visit: Payer: Self-pay | Admitting: Family Medicine

## 2018-12-04 DIAGNOSIS — I10 Essential (primary) hypertension: Secondary | ICD-10-CM

## 2018-12-05 MED ORDER — HYDROCHLOROTHIAZIDE 25 MG PO TABS: 25.0000 mg | ORAL_TABLET | Freq: Every day | ORAL | 0 refills | Status: DC

## 2018-12-05 NOTE — Telephone Encounter (Signed)
Hydrochlorothiazide 25 mg #90 with 0 refills approved.  Pt has upcoming appt with stallings on 12/25/2018.

## 2018-12-25 ENCOUNTER — Encounter: Payer: Self-pay | Admitting: Family Medicine

## 2018-12-25 ENCOUNTER — Ambulatory Visit (INDEPENDENT_AMBULATORY_CARE_PROVIDER_SITE_OTHER): Payer: Self-pay | Admitting: Family Medicine

## 2018-12-25 ENCOUNTER — Other Ambulatory Visit: Payer: Self-pay

## 2018-12-25 VITALS — BP 162/80 | HR 73 | Temp 98.5°F | Ht 67.0 in | Wt 168.0 lb

## 2018-12-25 DIAGNOSIS — I10 Essential (primary) hypertension: Secondary | ICD-10-CM

## 2018-12-25 DIAGNOSIS — Z23 Encounter for immunization: Secondary | ICD-10-CM

## 2018-12-25 DIAGNOSIS — Z5181 Encounter for therapeutic drug level monitoring: Secondary | ICD-10-CM

## 2018-12-25 DIAGNOSIS — R7303 Prediabetes: Secondary | ICD-10-CM

## 2018-12-25 DIAGNOSIS — R7989 Other specified abnormal findings of blood chemistry: Secondary | ICD-10-CM

## 2018-12-25 DIAGNOSIS — F331 Major depressive disorder, recurrent, moderate: Secondary | ICD-10-CM

## 2018-12-25 LAB — TSH: TSH: 0.436 u[IU]/mL — ABNORMAL LOW (ref 0.450–4.500)

## 2018-12-25 LAB — LIPID PANEL
Chol/HDL Ratio: 5.4 ratio — ABNORMAL HIGH (ref 0.0–5.0)
Cholesterol, Total: 280 mg/dL — ABNORMAL HIGH (ref 100–199)
HDL: 52 mg/dL (ref >39)
LDL Chol Calc (NIH): 192 mg/dL — ABNORMAL HIGH (ref 0–99)
Triglycerides: 193 mg/dL — ABNORMAL HIGH (ref 0–149)
VLDL Cholesterol Cal: 36 mg/dL (ref 5–40)

## 2018-12-25 LAB — HEMOGLOBIN A1C
Est. average glucose Bld gHb Est-mCnc: 123 mg/dL
Hgb A1c MFr Bld: 5.9 % — ABNORMAL HIGH (ref 4.8–5.6)

## 2018-12-25 LAB — POCT URINALYSIS DIP (MANUAL ENTRY)
Bilirubin, UA: NEGATIVE
Glucose, UA: NEGATIVE mg/dL
Ketones, POC UA: NEGATIVE mg/dL
Leukocytes, UA: NEGATIVE
Nitrite, UA: NEGATIVE
Protein Ur, POC: NEGATIVE mg/dL
Spec Grav, UA: 1.025 (ref 1.010–1.025)
Urobilinogen, UA: 0.2 E.U./dL
pH, UA: 5 (ref 5.0–8.0)

## 2018-12-25 MED ORDER — MIRTAZAPINE 15 MG PO TABS: 15.0000 mg | ORAL_TABLET | Freq: Every day | ORAL | 3 refills | Status: DC

## 2018-12-25 MED ORDER — ALBUTEROL SULFATE HFA 108 (90 BASE) MCG/ACT IN AERS: 2.0000 | INHALATION_SPRAY | Freq: Four times a day (QID) | RESPIRATORY_TRACT | 1 refills | Status: DC | PRN

## 2018-12-25 MED ORDER — SERTRALINE HCL 100 MG PO TABS: 200.0000 mg | ORAL_TABLET | Freq: Every day | ORAL | 3 refills | Status: DC

## 2018-12-25 NOTE — Patient Instructions (Addendum)
Please return for nurse visit in 2 weeks for blood pressure check.  Take your medications that day but avoid salty foods and drink plenty of water.    If you have lab work done today you will be contacted with your lab results within the next 2 weeks.  If you have not heard from [place] then please contact us. The fastest way to get your results is to register for My Chart.   IF you received an x-ray today, you will receive an invoice from [facility]. Please contact [facility] at [phone] with questions or concerns regarding your invoice.   IF you received labwork today, you will receive an invoice from [facility]. Please contact [facility] at [phone] with questions or concerns regarding your invoice.   Our billing staff will not be able to assist you with questions regarding bills from these companies.  You will be contacted with the lab results as soon as they are available. The fastest way to get your results is to activate your My Chart account. Instructions are located on the last page of this paperwork. If you have not heard from [place] regarding the results in 2 weeks, please contact this office.     Hypertension, Adult High blood pressure (hypertension) is when the force of blood pumping through the arteries is too strong. The arteries are the blood vessels that carry blood from the heart throughout the body. Hypertension forces the heart to work harder to pump blood and may cause arteries to become narrow or stiff. Untreated or uncontrolled hypertension can cause a heart attack, heart failure, a stroke, kidney disease, and other problems. A blood pressure reading consists of a higher number over a lower number. Ideally, your blood pressure should be below 120/80. The first ("top") number is called the systolic pressure. It is a measure of the pressure in your arteries as your heart beats. The second ("bottom") number is called the diastolic pressure. It is a measure of  the pressure in your arteries as the heart relaxes. What are the causes? The exact cause of this condition is not known. There are some conditions that result in or are related to high blood pressure. What increases the risk? Some risk factors for high blood pressure are under your control. The following factors may make you more likely to develop this condition:  Smoking.  Having type 2 diabetes mellitus, high cholesterol, or both.  Not getting enough exercise or physical activity.  Being overweight.  Having too much fat, sugar, calories, or salt (sodium) in your diet.  Drinking too much alcohol. Some risk factors for high blood pressure may be difficult or impossible to change. Some of these factors include:  Having chronic kidney disease.  Having a family history of high blood pressure.  Age. Risk increases with age.  Race. You may be at higher risk if you are African American.  Gender. Men are at higher risk than women before age 43. After age 37, women are at higher risk than men.  Having obstructive sleep apnea.  Stress. What are the signs or symptoms? High blood pressure may not cause symptoms. Very high blood pressure (hypertensive crisis) may cause:  Headache.  Anxiety.  Shortness of breath.  Nosebleed.  Nausea and vomiting.  Vision changes.  Severe chest pain.  Seizures. How is this diagnosed? This condition is diagnosed by measuring your blood pressure while you are seated, with your arm resting on a flat surface, your legs uncrossed, and your feet flat  on the floor. The cuff of the blood pressure monitor will be placed directly against the skin of your upper arm at the level of your heart. It should be measured at least twice using the same arm. Certain conditions can cause a difference in blood pressure between your right and left arms. Certain factors can cause blood pressure readings to be lower or higher than normal for a short period of  time:  When your blood pressure is higher when you are in a health care provider's office than when you are at home, this is called white coat hypertension. Most people with this condition do not need medicines.  When your blood pressure is higher at home than when you are in a health care provider's office, this is called masked hypertension. Most people with this condition may need medicines to control blood pressure. If you have a high blood pressure reading during one visit or you have normal blood pressure with other risk factors, you may be asked to:  Return on a different day to have your blood pressure checked again.  Monitor your blood pressure at home for 1 week or longer. If you are diagnosed with hypertension, you may have other blood or imaging tests to help your health care provider understand your overall risk for other conditions. How is this treated? This condition is treated by making healthy lifestyle changes, such as eating healthy foods, exercising more, and reducing your alcohol intake. Your health care provider may prescribe medicine if lifestyle changes are not enough to get your blood pressure under control, and if:  Your systolic blood pressure is above 130.  Your diastolic blood pressure is above 80. Your personal target blood pressure may vary depending on your medical conditions, your age, and other factors. Follow these instructions at home: Eating and drinking   Eat a diet that is high in fiber and potassium, and low in sodium, added sugar, and fat. An example eating plan is called the DASH (Dietary Approaches to Stop Hypertension) diet. To eat this way: ? Eat plenty of fresh fruits and vegetables. Try to fill one half of your plate at each meal with fruits and vegetables. ? Eat whole grains, such as whole-wheat pasta, brown rice, or whole-grain bread. Fill about one fourth of your plate with whole grains. ? Eat or drink low-fat dairy products, such as skim  milk or low-fat yogurt. ? Avoid fatty cuts of meat, processed or cured meats, and poultry with skin. Fill about one fourth of your plate with lean proteins, such as fish, chicken without skin, beans, eggs, or tofu. ? Avoid pre-made and processed foods. These tend to be higher in sodium, added sugar, and fat.  Reduce your daily sodium intake. Most people with hypertension should eat less than 1,500 mg of sodium a day.  Do not drink alcohol if: ? Your health care provider tells you not to drink. ? You are pregnant, may be pregnant, or are planning to become pregnant.  If you drink alcohol: ? Limit how much you use to:  0-1 drink a day for women.  0-2 drinks a day for men. ? Be aware of how much alcohol is in your drink. In the [place] one drink equals one 12 oz bottle of beer (355 mL), one 5 oz glass of wine (148 mL), or one 1 oz glass of hard liquor (44 mL). Lifestyle   Work with your health care provider to maintain a healthy body weight or to lose weight.  Ask what an ideal weight is for you.  Get at least 30 minutes of exercise most days of the week. Activities may include walking, swimming, or biking.  Include exercise to strengthen your muscles (resistance exercise), such as Pilates or lifting weights, as part of your weekly exercise routine. Try to do these types of exercises for 30 minutes at least 3 days a week.  Do not use any products that contain nicotine or tobacco, such as cigarettes, e-cigarettes, and chewing tobacco. If you need help quitting, ask your health care provider.  Monitor your blood pressure at home as told by your health care provider.  Keep all follow-up visits as told by your health care provider. This is important. Medicines  Take over-the-counter and prescription medicines only as told by your health care provider. Follow directions carefully. Blood pressure medicines must be taken as prescribed.  Do not skip doses of blood pressure medicine. Doing this  puts you at risk for problems and can make the medicine less effective.  Ask your health care provider about side effects or reactions to medicines that you should watch for. Contact a health care provider if you:  Think you are having a reaction to a medicine you are taking.  Have headaches that keep coming back (recurring).  Feel dizzy.  Have swelling in your ankles.  Have trouble with your vision. Get help right away if you:  Develop a severe headache or confusion.  Have unusual weakness or numbness.  Feel faint.  Have severe pain in your chest or abdomen.  Vomit repeatedly.  Have trouble breathing. Summary  Hypertension is when the force of blood pumping through your arteries is too strong. If this condition is not controlled, it may put you at risk for serious complications.  Your personal target blood pressure may vary depending on your medical conditions, your age, and other factors. For most people, a normal blood pressure is less than 120/80.  Hypertension is treated with lifestyle changes, medicines, or a combination of both. Lifestyle changes include losing weight, eating a healthy, low-sodium diet, exercising more, and limiting alcohol. This information is not intended to replace advice given to you by your health care provider. Make sure you discuss any questions you have with your health care provider. Document Released: 02/13/2005 Document Revised: 10/24/2017 Document Reviewed: 10/24/2017 Elsevier Patient Education  2020 [employer]

## 2018-12-25 NOTE — Progress Notes (Signed)
Established Patient Office Visit  Subjective:  Patient ID: Darren [name], male    DOB: 1971/01/03  Age: 48 y.o. 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 He sleeps with the mirtazapine He takes the zoloft last but typically gets 200mg  [facility] 12/25/2018 09/06/2017 09/01/2017 02/07/2017 04/07/2016  Decreased Interest 0 0 0 0 0  Down, Depressed, Hopeless 0 0 0 0 0  PHQ - 2 Score 0 0 0 0 0  Altered sleeping 1 - - - -  Tired, decreased energy 0 - - - -  Change in appetite 0 - - - -  Feeling bad or failure about yourself  0 - - - -  Trouble concentrating 1 - - - -  Moving slowly or fidgety/restless 0 - - - -  Suicidal thoughts 0 - - - -  PHQ-9 Score 2 - - - -  Some encounter information is confidential and restricted. Go to Review Flowsheets activity to see all data.   Hypertension: Patient here for follow-up of elevated blood pressure. He is exercising and is adherent to low salt diet.  Blood pressure is well controlled at home. Cardiac symptoms none. Patient denies chest pain, chest pressure/discomfort, dyspnea, fatigue, irregular heart beat and lower extremity edema.  Cardiovascular risk factors: dyslipidemia and hypertension. Use of agents associated with hypertension: none. History of target organ damage: none. BP Readings from Last 3 Encounters:  12/25/18 (!) 162/80  02/27/18 (!) 187/88  10/25/17 110/72    Coronary Artery Disease: Patient presents for routine coronary artery disease follow-up.  Current symptoms: non-existent Pain radiation: none Patient also complains of no other  symptoms. Pain aggravating factors: none  He has quit smoking.    Lab Results  Component Value Date   CHOL 220 (H) 09/01/2017   CHOL 127 11/12/2015   CHOL 182 10/22/2014   Lab Results  Component Value Date   HDL 56 09/01/2017   HDL 42 11/12/2015   HDL 49.80 10/22/2014   Lab Results  Component Value Date   LDLCALC 138 (H) 09/01/2017   LDLCALC 74 11/12/2015   LDLCALC 110 (H) 10/22/2014   Lab Results  Component Value Date   TRIG 130 09/01/2017   TRIG 55 11/12/2015   TRIG 107.0 10/22/2014   Lab Results  Component Value Date   CHOLHDL 3.9 09/01/2017   CHOLHDL 3.0 11/12/2015   CHOLHDL 4 10/22/2014   No results found for: LDLDIRECT   Past Medical History:  Diagnosis Date   Anxiety    Aortic insufficiency    moderate by echo 04/2014   Coronary artery disease 03/24/2014   NSTEMI s/p PCI of mid LAD and repeat cath with patent stent   Depression    Hypertension    Tobacco abuse     Past Surgical History:  Procedure Laterality Date   CARDIAC CATHETERIZATION  03/04/2014   PCI of mid LAD   CARDIAC CATHETERIZATION N/A 11/12/2015   Procedure: Left Heart Cath and Coronary Angiography;  Surgeon: Tonny BollmanMichael Cooper, MD;  [facility];  Service: Cardiovascular;  Laterality: N/A;   LEFT HEART CATHETERIZATION WITH CORONARY ANGIOGRAM Bilateral 03/04/2014   Procedure: LEFT HEART CATHETERIZATION WITH CORONARY ANGIOGRAM;  Surgeon: Kathleene Hazel, MD;  [facility];  Service: Cardiovascular;  Laterality: Bilateral;   LEFT HEART CATHETERIZATION WITH CORONARY ANGIOGRAM N/A 03/26/2014   Procedure: LEFT HEART CATHETERIZATION WITH CORONARY ANGIOGRAM;  Surgeon: Lennette Bihari, MD;  Location: [facility] CATH LAB;  Service: Cardiovascular;  Laterality: N/A;    Family History  Problem Relation Age of Onset   Hypertension Mother    Hypertension Father    Alcohol abuse Father    Hypertension Other    Diabetes Other    Heart attack Other     Social History    Socioeconomic History   Marital status: Legally Separated    Spouse name: Not on file   Number of children: Not on file   Years of education: Not on file   Highest education level: Not on file  Occupational History   Not on file  Social Needs   Financial resource strain: Not on file   Food insecurity    Worry: Not on file    Inability: Not on file   Transportation needs    Medical: Not on file    Non-medical: Not on file  Tobacco Use   Smoking status: Former Smoker    Packs/day: 0.50    Years: 30.00    Pack years: 15.00    Types: Cigarettes    Quit date: 12/29/2015    Years since quitting: 2.9   Smokeless tobacco: Never Used  Substance and Sexual Activity   Alcohol use: No    Alcohol/week: 0.0 standard drinks   Drug use: Yes    Types: Marijuana    Comment: daily use   Sexual activity: Not Currently    Birth control/protection: Condom  Lifestyle   Physical activity    Days per week: Not on file    Minutes per session: Not on file   Stress: Not on file  Relationships   Social connections    Talks on phone: Not on file    Gets together: Not on file    Attends religious service: Not on file    Active member of club or organization: Not on file    Attends meetings of clubs or organizations: Not on file    Relationship status: Not on file   Intimate partner violence    Fear of current or ex partner: Not on file    Emotionally abused: Not on file    Physically abused: Not on file    Forced sexual activity: Not on file  Other Topics Concern   Not on file  Social History Narrative   Not on file    Outpatient Medications Prior to Visit  Medication Sig Dispense Refill   aspirin EC 81 MG tablet Take 81 mg by mouth.     hydrochlorothiazide (HYDRODIURIL) 25 MG tablet Take 1 tablet (25 mg total) by mouth daily. 90 tablet 0   nitroGLYCERIN (NITROSTAT) 0.4 MG SL tablet Place 1 tablet (0.4 mg total) under the tongue every 5 (five) minutes as needed  for chest pain. 30 tablet 0   albuterol (PROVENTIL HFA;VENTOLIN HFA) 108 (90 Base) MCG/ACT inhaler Inhale 2 puffs into the lungs every 6 (six) hours as needed for wheezing or shortness of breath. 1 Inhaler 1   ibuprofen (ADVIL,MOTRIN) 800 MG tablet Take 800 mg by mouth every 6 (six) hours as needed for fever or moderate pain.  mirtazapine (REMERON) 15 MG tablet Take 1 tablet (15 mg total) by mouth at bedtime. 90 tablet 1   sertraline (ZOLOFT) 100 MG tablet Take 1 tablet (100 mg total) by mouth daily. (Patient taking differently: Take 200 mg by mouth daily. ) 90 tablet 1   amoxicillin (AMOXIL) 500 MG capsule Take 500 mg by mouth 3 (three) times daily.     atorvastatin (LIPITOR) 10 MG tablet Take 1 tablet (10 mg total) by mouth daily. (Patient not taking: Reported on 02/27/2018) 90 tablet 3   clindamycin (CLEOCIN) 150 MG capsule Take 1 capsule (150 mg total) by mouth every 6 (six) hours. 28 capsule 0   cyclobenzaprine (FLEXERIL) 10 MG tablet Take 0.5-1 tablets (5-10 mg total) by mouth 3 (three) times daily as needed. 30 tablet 3   mometasone-formoterol (DULERA) 100-5 MCG/ACT AERO Inhale 2 puffs into the lungs 2 (two) times daily. (Patient not taking: Reported on 02/27/2018) 13 g 1   No facility-administered medications prior to visit.     Allergies  Allergen Reactions   Hydrocodone Hives    ROS Review of Systems Review of Systems  Constitutional: Negative for activity change, appetite change, chills and fever.  HENT: Negative for congestion, nosebleeds, trouble swallowing and voice change.   Respiratory: Negative for cough, shortness of breath and wheezing.   Gastrointestinal: Negative for diarrhea, nausea and vomiting.  Genitourinary: Negative for difficulty urinating, dysuria, flank pain and hematuria.  Musculoskeletal: Negative for back pain, joint swelling and neck pain.  Neurological: Negative for dizziness, speech difficulty, light-headedness and numbness.  See HPI. All  other review of systems negative.     Objective:    Physical Exam  BP (!) 162/80    Pulse 73    Temp 98.5 F (36.9 C)    Ht 5\' 7"  (1.702 m)    Wt 168 lb (76.2 kg)    SpO2 98%    BMI 26.31 kg/m  Wt Readings from Last 3 Encounters:  12/25/18 168 lb (76.2 kg)  02/27/18 160 lb 8 oz (72.8 kg)  10/25/17 156 lb 9.6 oz (71 kg)   Physical Exam  Constitutional: Oriented to person, place, and time. Appears well-developed and well-nourished.  HENT:  Head: Normocephalic and atraumatic.  Eyes: Conjunctivae and EOM are normal.  Neck: supple, no thyromegaly Cardiovascular: Normal rate, regular rhythm, normal heart sounds and intact distal pulses.  No murmur heard. Pulmonary/Chest: Effort normal and breath sounds normal. No stridor. No respiratory distress. Has no wheezes.  Abdomen:  Nondistended, normoactive bs, nontender Neurological: Is alert and oriented to person, place, and time.  Skin: Skin is warm. Capillary refill takes less than 2 seconds.  Psychiatric: Has a normal mood and affect. Behavior is normal. Judgment and thought content normal.    Health Maintenance Due  Topic Date Due   INFLUENZA VACCINE  09/28/2018    There are no preventive care reminders to display for this patient.  Lab Results  Component Value Date   TSH 0.565 10/25/2017   Lab Results  Component Value Date   WBC 5.6 09/01/2017   HGB 14.3 09/01/2017   HCT 45.2 09/01/2017   MCV 90 09/01/2017   PLT 275 09/01/2017   Lab Results  Component Value Date   NA 137 09/01/2017   K 4.6 09/01/2017   CO2 22 09/01/2017   GLUCOSE 85 09/01/2017   BUN 12 09/01/2017   CREATININE 1.08 09/01/2017   BILITOT 0.4 09/01/2017   ALKPHOS 66 09/01/2017   AST 18 09/01/2017  ALT 12 09/01/2017   PROT 7.3 09/01/2017   ALBUMIN 4.5 09/01/2017   CALCIUM 9.2 09/01/2017   ANIONGAP 6 11/11/2015   GFR 90.25 10/22/2014   Lab Results  Component Value Date   CHOL 220 (H) 09/01/2017   Lab Results  Component Value Date   HDL 56  09/01/2017   Lab Results  Component Value Date   LDLCALC 138 (H) 09/01/2017   Lab Results  Component Value Date   TRIG 130 09/01/2017   Lab Results  Component Value Date   CHOLHDL 3.9 09/01/2017   Lab Results  Component Value Date   HGBA1C 6.0 (H) 09/01/2017      Assessment & Plan:   Problem List Items Addressed This Visit      Cardiovascular and Mediastinum   Essential hypertension - Primary Patient's blood pressure is at goal at home but did not take meds today Should return to get bp visit with nurse   I recommend that you exercise for 30-45 minutes 5 days a week. I also recommend a balanced diet with fruits and vegetables every day, lean meats, and little fried foods. The DASH diet (you can find this online) is a good example of this.      Other   Major depressive disorder, recurrent episode, moderate (HCC)  - improved  Will refill meds   Relevant Medications   sertraline (ZOLOFT) 100 MG tablet   mirtazapine (REMERON) 15 MG tablet    Other Visit Diagnoses    Need for prophylactic vaccination and inoculation against influenza       Encounter for medication monitoring    -  Will check urinalysis   Abnormal TSH    -  Will reassess   Prediabetes    -  Discussed diabetes prevention Last a1c 6%      Meds ordered this encounter  Medications   sertraline (ZOLOFT) 100 MG tablet    Sig: Take 2 tablets (200 mg total) by mouth daily.    Dispense:  180 tablet    Refill:  3   mirtazapine (REMERON) 15 MG tablet    Sig: Take 1 tablet (15 mg total) by mouth at bedtime.    Dispense:  90 tablet    Refill:  3   albuterol (VENTOLIN HFA) 108 (90 Base) MCG/ACT inhaler    Sig: Inhale 2 puffs into the lungs every 6 (six) hours as needed for wheezing or shortness of breath.    Dispense:  18 g    Refill:  1    Follow-up: Return in about 2 weeks (around 01/08/2019) for 2 weeks for nurse visit for bp check. 6 months for blood pressure follow up.    Doristine Bosworth, MD

## 2019-01-08 ENCOUNTER — Ambulatory Visit: Payer: Self-pay

## 2019-01-14 ENCOUNTER — Other Ambulatory Visit: Payer: Self-pay | Admitting: Family Medicine

## 2019-01-14 MED ORDER — ATORVASTATIN CALCIUM 20 MG PO TABS: 20.0000 mg | ORAL_TABLET | Freq: Every day | ORAL | 3 refills | Status: DC

## 2019-01-15 ENCOUNTER — Telehealth: Payer: Self-pay | Admitting: Family Medicine

## 2019-01-15 NOTE — Telephone Encounter (Signed)
Called patient to r/s appt for 06/26/2019, mailbox was full so couldn't leave a VM. Provider will be out of the office

## 2019-01-22 NOTE — Progress Notes (Signed)
Sent my chart message for pt

## 2019-01-27 ENCOUNTER — Ambulatory Visit: Payer: Self-pay

## 2019-03-16 ENCOUNTER — Other Ambulatory Visit: Payer: Self-pay | Admitting: Family Medicine

## 2019-03-16 DIAGNOSIS — I10 Essential (primary) hypertension: Secondary | ICD-10-CM

## 2019-03-17 NOTE — Telephone Encounter (Signed)
Requested Prescriptions  Pending Prescriptions Disp Refills  . hydrochlorothiazide (HYDRODIURIL) 25 MG tablet [Pharmacy Med Name: hydroCHLOROthiazide 25 MG Oral Tablet] 90 tablet 0    Sig: Take 1 tablet by mouth once daily     Cardiovascular: Diuretics - Thiazide Failed - 03/17/2019 10:45 AM      Failed - Ca in normal range and within 360 days    Calcium  Date Value Ref Range Status  09/01/2017 9.2 8.7 - 10.2 mg/dL Final   Calcium, Ion  Date Value Ref Range Status  11/10/2015 1.09 (L) 1.15 - 1.40 mmol/L Final         Failed - Cr in normal range and within 360 days    Creatinine, Ser  Date Value Ref Range Status  09/01/2017 1.08 0.76 - 1.27 mg/dL Final         Failed - K in normal range and within 360 days    Potassium  Date Value Ref Range Status  09/01/2017 4.6 3.5 - 5.2 mmol/L Final         Failed - Na in normal range and within 360 days    Sodium  Date Value Ref Range Status  09/01/2017 137 134 - 144 mmol/L Final         Failed - Last BP in normal range    BP Readings from Last 1 Encounters:  12/25/18 (!) 162/80         Passed - Valid encounter within last 6 months    Recent Outpatient Visits          2 months ago Essential hypertension   [facility] at [facility], Manus Rudd, MD   1 year ago Abnormal TSH   [facility] at [facility], Manus Rudd, MD   1 year ago Acute non intractable tension-type headache   [facility] at [facility], Marolyn Hammock, PA-C   1 year ago Annual physical exam   Primary Care at [facility], Zoe A, MD   2 years ago Coronary artery disease due to lipid rich plaque   Primary Care at [facility], Manus Rudd, MD

## 2019-06-26 ENCOUNTER — Ambulatory Visit: Payer: Self-pay | Admitting: Family Medicine

## 2019-07-17 ENCOUNTER — Telehealth: Payer: Self-pay | Admitting: Emergency Medicine

## 2019-07-17 DIAGNOSIS — Z1152 Encounter for screening for COVID-19: Secondary | ICD-10-CM

## 2019-07-17 NOTE — Progress Notes (Signed)
E-Visit for Corona Virus Screening  You should be tested for coronavirus.  Many health care providers can now test patients at their office but not all are.  [facility] has multiple testing sites. For information on our COVID testing locations and hours go to [email or web address]  We are enrolling you in our MyChart Home Monitoring for COVID19 . Daily you will receive a questionnaire within the MyChart website. Our COVID 19 response team will be monitoring your responses daily.  Testing Information: The COVID-19 Community Testing sites will begin testing BY APPOINTMENT ONLY.  You can schedule online at [email or web address]  If you do not have access to a smart phone or computer you may call [phone] for an appointment.   Additional testing sites in the Community:  . For [facility] Testing sites in [facility]  [email or web address]  . For Pop-up testing sites in [place]  [email or web address]  . For Testing sites with regular hours [email or web address]  . For [facility] [email or web address]  . For [facility] [email or web address]  . For [facility] testing in [place] and [employer] [email or web address]  . For [facility] testing in [facility]   [email or web address]  For  more information about community testing call [phone]   Please quarantine yourself while awaiting your test results. Please stay home for a minimum of 10 days from the first day of illness with improving symptoms and you have had 24 hours of no fever (without the use of Tylenol (Acetaminophen) Motrin (Ibuprofen) or any  fever reducing medication).  Also - Do not get tested prior to returning to work because once you have had a positive test the test can stay positive for more then a month in some cases.   You should wear a mask or cloth face covering over your nose and mouth if you must be around other people or animals, including pets (even at home). Try to stay at least 6 feet away from other people. This will protect the people around you.  Please continue good preventive care measures, including:  frequent hand-washing, avoid touching your face, cover coughs/sneezes, stay out of crowds and keep a 6 foot distance from others.  COVID-19 is a respiratory illness with symptoms that are similar to the flu. Symptoms are typically mild to moderate, but there have been cases of severe illness and death due to the virus.   The following symptoms may appear 2-14 days after exposure: . Fever . Cough . Shortness of breath or difficulty breathing . Chills . Repeated shaking with chills . Muscle pain . Headache . Sore throat . New loss of taste or smell . Fatigue . Congestion or runny nose . Nausea or vomiting . Diarrhea  Go to the nearest hospital ED for assessment if fever/cough/breathlessness are severe or illness seems like a threat to life.  It is vitally important that if you feel that you have an infection such as this virus or any other virus that you stay home and away from places where you may spread it to others.  You should avoid contact with people age 30 and older.    You may also take acetaminophen (Tylenol) as needed for fever.  Reduce your risk of any infection by using the same precautions used for avoiding the common cold or flu:  Marland Kitchen Wash your hands often with soap and warm water for at least 20 seconds.  If soap and water are not readily available, use an alcohol-based hand sanitizer with at least 60% alcohol.  Marland Kitchen  If coughing or sneezing, cover your mouth and nose by coughing or sneezing into the  elbow areas of your shirt or coat, into a tissue or into your sleeve (not your hands). . Avoid shaking hands with others and consider head nods or verbal greetings only. . Avoid touching your eyes, nose, or mouth with unwashed hands.  . Avoid close contact with people who are sick. . Avoid places or events with large numbers of people in one location, like concerts or sporting events. . Carefully consider travel plans you have or are making. . If you are planning any travel outside or inside the [place], visit the CDC's Travelers' Health webpage for the latest health notices. . If you have some symptoms but not all symptoms, continue to monitor at home and seek medical attention if your symptoms worsen. . If you are having a medical emergency, call 911.  HOME CARE . Only take medications as instructed by your medical team. . Drink plenty of fluids and get plenty of rest. . A steam or ultrasonic humidifier can help if you have congestion.   GET HELP RIGHT AWAY IF YOU HAVE EMERGENCY WARNING SIGNS** FOR COVID-19. If you or someone is showing any of these signs seek emergency medical care immediately. Call 911 or proceed to your closest emergency facility if: . You develop worsening high fever. . Trouble breathing . Bluish lips or face . Persistent pain or pressure in the chest . New confusion . Inability to wake or stay awake . You cough up blood. . Your symptoms become more severe  **This list is not all possible symptoms. Contact your medical provider for any symptoms that are sever or concerning to you.  MAKE SURE YOU   Understand these instructions.  Will watch your condition.  Will get help right away if you are not doing well or get worse.  Your e-visit answers were reviewed by a board certified advanced clinical practitioner to complete your personal care plan.  Depending on the condition, your plan could have included both over the counter or prescription medications.  If there is  a problem please reply once you have received a response from your provider.  Your safety is important to [place].  If you have drug allergies check your prescription carefully.    You can use MyChart to ask questions about today's visit, request a non-urgent call back, or ask for a work or school excuse for 24 hours related to this e-Visit. If it has been greater than 24 hours you will need to follow up with your provider, or enter a new e-Visit to address those concerns. You will get an e-mail in the next two days asking about your experience.  I hope that your e-visit has been valuable and will speed your recovery. Thank you for using e-visits.   Approximately 5 minutes was used in reviewing the patient's chart, questionnaire, prescribing medications, and documentation.

## 2019-09-05 ENCOUNTER — Other Ambulatory Visit: Payer: Self-pay

## 2019-09-05 DIAGNOSIS — I10 Essential (primary) hypertension: Secondary | ICD-10-CM

## 2019-09-05 MED ORDER — HYDROCHLOROTHIAZIDE 25 MG PO TABS: 25.0000 mg | ORAL_TABLET | Freq: Every day | ORAL | 0 refills | Status: DC

## 2019-09-23 ENCOUNTER — Encounter: Payer: Self-pay | Admitting: Family Medicine

## 2019-10-07 ENCOUNTER — Other Ambulatory Visit: Payer: Self-pay | Admitting: Family Medicine

## 2019-10-07 DIAGNOSIS — I10 Essential (primary) hypertension: Secondary | ICD-10-CM

## 2019-10-09 ENCOUNTER — Other Ambulatory Visit: Payer: Self-pay | Admitting: Family Medicine

## 2019-10-09 DIAGNOSIS — I10 Essential (primary) hypertension: Secondary | ICD-10-CM

## 2019-10-10 ENCOUNTER — Telehealth: Payer: Self-pay

## 2019-10-10 MED ORDER — HYDROCHLOROTHIAZIDE 25 MG PO TABS: 25.0000 mg | ORAL_TABLET | Freq: Every day | ORAL | 0 refills | Status: DC

## 2019-10-10 NOTE — Telephone Encounter (Signed)
Patient stated he is looking for another physician and is aware of the refill for  #30 HCTZ Rx at his pharmacy.

## 2019-10-10 NOTE — Telephone Encounter (Signed)
Called patient and LVM letting him know the medication was sent to the pharmacy as an courtesy because he hasn't been seen since 11/2018 , and was Dr. Creta Levin Patient.

## 2019-10-21 ENCOUNTER — Encounter: Payer: Self-pay | Admitting: Family Medicine

## 2019-10-30 ENCOUNTER — Ambulatory Visit (INDEPENDENT_AMBULATORY_CARE_PROVIDER_SITE_OTHER): Payer: 59 | Admitting: Family Medicine

## 2019-10-30 ENCOUNTER — Other Ambulatory Visit: Payer: Self-pay

## 2019-10-30 ENCOUNTER — Encounter: Payer: Self-pay | Admitting: Family Medicine

## 2019-10-30 VITALS — BP 159/87 | HR 72 | Temp 98.2°F | Ht 67.0 in | Wt 164.0 lb

## 2019-10-30 DIAGNOSIS — M5441 Lumbago with sciatica, right side: Secondary | ICD-10-CM | POA: Diagnosis not present

## 2019-10-30 DIAGNOSIS — E785 Hyperlipidemia, unspecified: Secondary | ICD-10-CM

## 2019-10-30 DIAGNOSIS — Z1211 Encounter for screening for malignant neoplasm of colon: Secondary | ICD-10-CM

## 2019-10-30 DIAGNOSIS — I10 Essential (primary) hypertension: Secondary | ICD-10-CM

## 2019-10-30 DIAGNOSIS — F331 Major depressive disorder, recurrent, moderate: Secondary | ICD-10-CM

## 2019-10-30 MED ORDER — CYCLOBENZAPRINE HCL 5 MG PO TABS: ORAL_TABLET | ORAL | 0 refills | Status: DC

## 2019-10-30 MED ORDER — HYDROCHLOROTHIAZIDE 25 MG PO TABS: 25.0000 mg | ORAL_TABLET | Freq: Every day | ORAL | 1 refills | Status: DC

## 2019-10-30 MED ORDER — AMLODIPINE BESYLATE 2.5 MG PO TABS: 5.0000 mg | ORAL_TABLET | Freq: Every day | ORAL | 1 refills | Status: DC

## 2019-10-30 NOTE — Progress Notes (Signed)
Subjective:  Patient ID: Darren [name], male    DOB: 05-22-1970  Age: 49 y.o. MRN: [id number]  CC:  Chief Complaint  Patient presents with  . Back Pain    Pt reports he had pain in both of his legs yesterday, but today his lower back on the right side. pt states the pain shoots from his lower back through the R side of his buttocks and into the R leg and al; the way down the leg stopping before the foot. 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 pt states he has't done anythin out side his usual routine. Pt did states that a while back while working he herd a poping noise come from [place]  . Medication Refill    pt is requesting a refill on his BP medication. Pt reports no issues with his hypertension since last OV. pt dosen't check BP at home. Pt reports nophysical symptoms of hypertension. Pt reports yesterday he did feel "a little off" states he was hydrated, but become a little off balance and vision went in and out.    [name] presents for  Multiple concerns as above.  New patient to me.  Previous followed by Dr. Creta Levin but Last visit in October 2020.  Back pain: Right lower back today.  Had some pain in both legs past few days. Left leg better past few days, R still sore.  Pain shoots from lower back to the right side of his buttocks into the right leg towards the foot.  No known trauma.  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  Nothing outside his usual routine. Some work on hills at time.  R hip pop few weeks ago, but walking fine.  No prior back surgery/injection.  Spasms in back in past. Treated with mm relaxer. None left now. No new sports/activities.  No bowel or bladder incontinence, no saddle anesthesia, no lower extremity weakness.  Tx: otc ibuprofen BID past few days - some relief.   Hypertension: hctz  qd. Plan for 2 week BP check after 12/25/18 visit.  No home readings.  No  new side effects.  Episode of feeling off balance, vision was slightly off yesterday- lasted about minute or 2. Feeling ok now - no recurrence. No focal weakness, no slurred speech/facial droop or HA.   BP Readings from Last 3 Encounters:  10/30/19 (!) 159/87  12/25/18 (!) 162/80  02/27/18 (!) 187/88   Lab Results  Component Value Date   CREATININE 1.08 09/01/2017   Hyperlipidemia: Stopped lipitor d/t side effects last year. Fasting currently.   Lab Results  Component Value Date   CHOL 280 (H) 12/25/2018   HDL 52 12/25/2018   LDLCALC 192 (H) 12/25/2018   TRIG 193 (H) 12/25/2018   CHOLHDL 5.4 (H) 12/25/2018   Lab Results  Component Value Date   ALT 12 09/01/2017   AST 18 09/01/2017   ALKPHOS 66 09/01/2017   BILITOT 0.4 09/01/2017   Depression: remeron working well for sleep, zoloft works well for depression.  Depression screen [facility] 2/9 10/30/2019 12/25/2018 09/06/2017 09/01/2017 02/07/2017  Decreased Interest 0 0 0 0 0  Down, Depressed, Hopeless 0 0 0 0 0  PHQ - 2 Score 0 0 0 0 0  Altered sleeping - 1 - - -  Tired, decreased energy - 0 - - -  Change in appetite - 0 - - -  Feeling bad or failure about yourself  - 0 - - -  Trouble concentrating - 1 - - -  Moving slowly or fidgety/restless - 0 - - -  Suicidal thoughts - 0 - - -  PHQ-9 Score - 2 - - -  Some encounter information is confidential and restricted. Go to Review Flowsheets activity to see all data.   Due for colonoscopy.   History Patient Active Problem List   Diagnosis Date Noted  . Alcohol dependence in remission (HCC) 09/01/2017  . Major depressive disorder, recurrent episode, moderate (HCC) 09/01/2017  . Dyslipidemia 02/01/2017  . Coronary artery disease involving native heart 02/01/2017  . Chest pain in adult 02/01/2017  . Hypertension 02/01/2017  . Pulmonary emphysema (HCC) 03/24/2016  . Angina pectoris (HCC) 11/11/2015  . Aortic insufficiency   . MDD (major depressive disorder) 08/06/2014  .  Depression   . Coronary artery disease due to lipid rich plaque   . Essential hypertension 03/04/2014   Past Medical History:  Diagnosis Date  . Anxiety   . Aortic insufficiency    moderate by echo 04/2014  . Coronary artery disease 03/24/2014   NSTEMI s/p PCI of mid LAD and repeat cath with patent stent  . Depression   . Hypertension   . Tobacco abuse    Past Surgical History:  Procedure Laterality Date  . CARDIAC CATHETERIZATION  03/04/2014   PCI of mid LAD  . CARDIAC CATHETERIZATION N/A 11/12/2015   Procedure: Left Heart Cath and Coronary Angiography;  Surgeon: Tonny BollmanMichael Cooper, MD;  [facility];  Service: Cardiovascular;  Laterality: N/A;  . LEFT HEART CATHETERIZATION WITH CORONARY ANGIOGRAM Bilateral 03/04/2014   Procedure: LEFT HEART CATHETERIZATION WITH CORONARY ANGIOGRAM;  Surgeon: Kathleene Hazelhristopher D McAlhany, MD;  [facility];  Service: Cardiovascular;  Laterality: Bilateral;  . LEFT HEART CATHETERIZATION WITH CORONARY ANGIOGRAM N/A 03/26/2014   Procedure: LEFT HEART CATHETERIZATION WITH CORONARY ANGIOGRAM;  Surgeon: Lennette Biharihomas A Kelly, MD;  [facility];  Service: Cardiovascular;  Laterality: N/A;   Allergies  Allergen Reactions  . Hydrocodone Hives   Prior to Admission medications   Medication Sig Start Date End Date Taking? Authorizing Provider  albuterol (VENTOLIN HFA) 108 (90 Base) MCG/ACT inhaler Inhale 2 puffs into the lungs every 6 (six) hours as needed for wheezing or shortness of breath. 12/25/18  Yes Collie SiadStallings, Zoe A, MD  aspirin EC 81 MG tablet Take 81 mg by mouth.   Yes [provider]  atorvastatin (LIPITOR) 20 MG tablet Take 1 tablet (20 mg total) by mouth daily. 01/14/19  Yes Stallings, Zoe A, MD  hydrochlorothiazide (HYDRODIURIL) 25 MG tablet Take 1 tablet (25 mg total) by mouth daily. 10/10/19  Yes Myles LippsSantiago, Irma M, MD  mirtazapine (REMERON) 15 MG tablet Take 1 tablet (15 mg total) by mouth at bedtime. 12/25/18 12/25/19 Yes  Stallings, Zoe A, MD  nitroGLYCERIN (NITROSTAT) 0.4 MG SL tablet Place 1 tablet (0.4 mg total) under the tongue every 5 (five) minutes as needed for chest pain. 09/01/17  Yes Collie SiadStallings, Zoe A, MD  sertraline (ZOLOFT) 100 MG tablet Take 2 tablets (200 mg total) by mouth daily. 12/25/18 12/25/19 Yes Doristine BosworthStallings, Zoe A, MD   Social History   Socioeconomic History  . Marital status: Legally Separated    Spouse name: Not on file  . Number of children: Not on file  . Years of education: Not on file  . Highest education level: Not on file  Occupational History  . Not  on file  Tobacco Use  . Smoking status: Former Smoker    Packs/day: 0.50    Years: 30.00    Pack years: 15.00    Types: Cigarettes    Quit date: 12/29/2015    Years since quitting: 3.8  . Smokeless tobacco: Never Used  Vaping Use  . Vaping Use: Never used  Substance and Sexual Activity  . Alcohol use: No    Alcohol/week: 0.0 standard drinks  . Drug use: Yes    Types: Marijuana    Comment: daily use  . Sexual activity: Not Currently    Birth control/protection: Condom  Other Topics Concern  . Not on file  Social History Narrative  . Not on file   Social Determinants of Health   Financial Resource Strain:   . Difficulty of Paying Living Expenses: Not on file  Food Insecurity:   . Worried About Programme researcher, broadcasting/film/video in the Last Year: Not on file  . Ran Out of Food in the Last Year: Not on file  Transportation Needs:   . Lack of Transportation (Medical): Not on file  . Lack of Transportation (Non-Medical): Not on file  Physical Activity:   . Days of Exercise per Week: Not on file  . Minutes of Exercise per Session: Not on file  Stress:   . Feeling of Stress : Not on file  Social Connections:   . Frequency of Communication with Friends and Family: Not on file  . Frequency of Social Gatherings with Friends and Family: Not on file  . Attends Religious Services: Not on file  . Active Member of Clubs or Organizations:  Not on file  . Attends Banker Meetings: Not on file  . Marital Status: Not on file  Intimate Partner Violence:   . Fear of Current or Ex-Partner: Not on file  . Emotionally Abused: Not on file  . Physically Abused: Not on file  . Sexually Abused: Not on file    Review of Systems  Per HPI   Objective:   Vitals:   10/30/19 1210  BP: (!) 159/87  Pulse: 72  Temp: 98.2 F (36.8 C)  TempSrc: Temporal  SpO2: 100%  Weight: 164 lb (74.4 kg)  Height: 5\' 7"  (1.702 m)    Physical Exam Vitals reviewed.  Constitutional:      Appearance: He is well-developed.  HENT:     Head: Normocephalic and atraumatic.  Eyes:     Pupils: Pupils are equal, round, and reactive to light.  Neck:     Vascular: No carotid bruit or JVD.  Cardiovascular:     Rate and Rhythm: Normal rate and regular rhythm.     Heart sounds: Normal heart sounds. No murmur heard.   Pulmonary:     Effort: Pulmonary effort is normal.     Breath sounds: Normal breath sounds. No rales.  Musculoskeletal:     Comments: LS spine: no midline bony ttp, R paraspinal ttp with slight tenderness to the right sciatic notch, motor 5, no focal bony tenderness.  Negative seated straight leg raise bilaterally.  Strength intact lower extremities with able to heel and toe walk.  Reflexes 2+ patella and Achilles bilaterally.   Skin:    General: Skin is warm and dry.  Neurological:     Mental Status: He is alert and oriented to person, place, and time.        Assessment & Plan:  SRIANSH FARRA is a 49 y.o. male . Essential hypertension -  Plan: hydrochlorothiazide (HYDRODIURIL) 25 MG tablet, amLODipine (NORVASC) 2.5 MG tablet, Comprehensive metabolic panel  -Decreased control, add low-dose amlodipine with potential side effects discussed.  Continue HCTZ 25 mg daily, check labs.  Special screening for malignant neoplasms, colon - Plan: Ambulatory referral to Gastroenterology  Hyperlipidemia, unspecified hyperlipidemia  type - Plan: Lipid panel  -Check lipids, can decide on medications at that time.  Acute right-sided low back pain with right-sided sciatica - Plan: cyclobenzaprine (FLEXERIL) 5 MG tablet  -Possible strain, some sciatica symptoms but reassuring exam.  No red flags on exam or history.  Imaging deferred at this time.  Symptomatic care with Flexeril, out of work note provided, short-term NSAID use if improved blood pressure control, recheck next week if not improving, sooner if worse.  Major depressive disorder, recurrent episode, moderate (HCC)  -Stable on current regimen.  No changes  Meds ordered this encounter  Medications  . hydrochlorothiazide (HYDRODIURIL) 25 MG tablet    Sig: Take 1 tablet (25 mg total) by mouth daily.    Dispense:  90 tablet    Refill:  1  . amLODipine (NORVASC) 2.5 MG tablet    Sig: Take 2 tablets (5 mg total) by mouth daily.    Dispense:  90 tablet    Refill:  1  . cyclobenzaprine (FLEXERIL) 5 MG tablet    Sig: 1 pill by mouth up to every 8 hours as needed. Start with one pill by mouth each bedtime as needed due to sedation    Dispense:  15 tablet    Refill:  0   Patient Instructions   Add amlodipine small dose once per day to help with blood pressure control.  Continue hydrochlorothiazide on this.  I will check some lab work and we can discuss cholesterol options at next visit in 3 weeks.  See information below on low back pain.  I suspect you have a pulled muscle or pinched nerve.  Try muscle relaxant up to every 8 hours, ibuprofen over-the-counter temporarily is okay if blood pressure is better with new medicine.  Follow-up next week if not improving, sooner if worse.   If any return of vision symptoms or lightheadedness as you felt yesterday, be seen.  Return to the clinic or go to the nearest emergency room if any of your symptoms worsen or new symptoms occur.   Acute Back Pain, Adult Acute back pain is sudden and usually short-lived. It is often  caused by an injury to the muscles and tissues in the back. The injury may result from:  A muscle or ligament getting overstretched or torn (strained). Ligaments are tissues that connect bones to each other. Lifting something improperly can cause a back strain.  Wear and tear (degeneration) of the spinal disks. Spinal disks are circular tissue that provides cushioning between the bones of the spine (vertebrae).  Twisting motions, such as while playing sports or doing yard work.  A hit to the back.  Arthritis. You may have a physical exam, lab tests, and imaging tests to find the cause of your pain. Acute back pain usually goes away with rest and home care. Follow these instructions at home: Managing pain, stiffness, and swelling  Take over-the-counter and prescription medicines only as told by your health care provider.  Your health care provider may recommend applying ice during the first 24-48 hours after your pain starts. To do this: ? Put ice in a plastic bag. ? Place a towel between your skin and the bag. ? Leave  the ice on for 20 minutes, 2-3 times a day.  If directed, apply heat to the affected area as often as told by your health care provider. Use the heat source that your health care provider recommends, such as a moist heat pack or a heating pad. ? Place a towel between your skin and the heat source. ? Leave the heat on for 20-30 minutes. ? Remove the heat if your skin turns bright red. This is especially important if you are unable to feel pain, heat, or cold. You have a greater risk of getting burned. Activity   Do not stay in bed. Staying in bed for more than 1-2 days can delay your recovery.  Sit up and stand up straight. Avoid leaning forward when you sit, or hunching over when you stand. ? If you work at a desk, sit close to it so you do not need to lean over. Keep your chin tucked in. Keep your neck drawn back, and keep your elbows bent at a right angle. Your arms  should look like the letter "L." ? Sit high and close to the steering wheel when you drive. Add lower back (lumbar) support to your car seat, if needed.  Take short walks on even surfaces as soon as you are able. Try to increase the length of time you walk each day.  Do not sit, drive, or stand in one place for more than 30 minutes at a time. Sitting or standing for long periods of time can put stress on your back.  Do not drive or use heavy machinery while taking prescription pain medicine.  Use proper lifting techniques. When you bend and lift, use positions that put less stress on your back: ? [place] your knees. ? Keep the load close to your body. ? Avoid twisting.  Exercise regularly as told by your health care provider. Exercising helps your back heal faster and helps prevent back injuries by keeping muscles strong and flexible.  Work with a physical therapist to make a safe exercise program, as recommended by your health care provider. Do any exercises as told by your physical therapist. Lifestyle  Maintain a healthy weight. Extra weight puts stress on your back and makes it difficult to have good posture.  Avoid activities or situations that make you feel anxious or stressed. Stress and anxiety increase muscle tension and can make back pain worse. Learn ways to manage anxiety and stress, such as through exercise. General instructions  Sleep on a firm mattress in a comfortable position. Try lying on your side with your knees slightly bent. If you lie on your back, put a pillow under your knees.  Follow your treatment plan as told by your health care provider. This may include: ? Cognitive or behavioral therapy. ? Acupuncture or massage therapy. ? Meditation or yoga. Contact a health care provider if:  You have pain that is not relieved with rest or medicine.  You have increasing pain going down into your legs or buttocks.  Your pain does not improve after 2 weeks.  You have  pain at night.  You lose weight without trying.  You have a fever or chills. Get help right away if:  You develop new bowel or bladder control problems.  You have unusual weakness or numbness in your arms or legs.  You develop nausea or vomiting.  You develop abdominal pain.  You feel faint. Summary  Acute back pain is sudden and usually short-lived.  Use proper lifting techniques.  When you bend and lift, use positions that put less stress on your back.  Take over-the-counter and prescription medicines and apply heat or ice as directed by your health care provider. This information is not intended to replace advice given to you by your health care provider. Make sure you discuss any questions you have with your health care provider. Document Revised: 06/04/2018 Document Reviewed: 09/27/2016 Elsevier Patient Education  2020 Elsevier Inc.  Sciatica  Sciatica is pain, weakness, tingling, or loss of feeling (numbness) along the sciatic nerve. The sciatic nerve starts in the lower back and goes down the back of each leg. Sciatica usually goes away on its own or with treatment. Sometimes, sciatica may come back (recur). What are the causes? This condition happens when the sciatic nerve is pinched or has pressure put on it. This may be the result of:  A disk in between the bones of the spine bulging out too far (herniated disk).  Changes in the spinal disks that occur with aging.  A condition that affects a muscle in the butt.  Extra bone growth near the sciatic nerve.  A break (fracture) of the area between your hip bones (pelvis).  Pregnancy.  Tumor. This is rare. What increases the risk? You are more likely to develop this condition if you:  Play sports that put pressure or stress on the spine.  Have poor strength and ease of movement (flexibility).  Have had a back injury in the past.  Have had back surgery.  Sit for long periods of time.  Do activities that  involve bending or lifting over and over again.  Are very overweight (obese). What are the signs or symptoms? Symptoms can vary from mild to very bad. They may include:  Any of these problems in the lower back, leg, hip, or butt: ? Mild tingling, loss of feeling, or dull aches. ? Burning sensations. ? Sharp pains.  Loss of feeling in the back of the calf or the sole of the foot.  Leg weakness.  Very bad back pain that makes it hard to move. These symptoms may get worse when you cough, sneeze, or laugh. They may also get worse when you sit or stand for long periods of time. How is this treated? This condition often gets better without any treatment. However, treatment may include:  Changing or cutting back on physical activity when you have pain.  Doing exercises and stretching.  Putting ice or heat on the affected area.  Medicines that help: ? To relieve pain and swelling. ? To relax your muscles.  Shots (injections) of medicines that help to relieve pain, irritation, and swelling.  Surgery. Follow these instructions at home: Medicines  Take over-the-counter and prescription medicines only as told by your doctor.  Ask your doctor if the medicine prescribed to you: ? Requires you to avoid driving or using heavy machinery. ? Can cause trouble pooping (constipation). You may need to take these steps to prevent or treat trouble pooping:  Drink enough fluids to keep your pee (urine) pale yellow.  Take over-the-counter or prescription medicines.  Eat foods that are high in fiber. These include beans, whole grains, and fresh fruits and vegetables.  Limit foods that are high in fat and sugar. These include fried or sweet foods. Managing pain      If told, put ice on the affected area. ? Put ice in a plastic bag. ? Place a towel between your skin and the bag. ? Leave the  ice on for 20 minutes, 2-3 times a day.  If told, put heat on the affected area. Use the heat  source that your doctor tells you to use, such as a moist heat pack or a heating pad. ? Place a towel between your skin and the heat source. ? Leave the heat on for 20-30 minutes. ? Remove the heat if your skin turns bright red. This is very important if you are unable to feel pain, heat, or cold. You may have a greater risk of getting burned. Activity   Return to your normal activities as told by your doctor. Ask your doctor what activities are safe for you.  Avoid activities that make your symptoms worse.  Take short rests during the day. ? When you rest for a long time, do some physical activity or stretching between periods of rest. ? Avoid sitting for a long time without moving. Get up and move around at least one time each hour.  Exercise and stretch regularly, as told by your doctor.  Do not lift anything that is heavier than 10 lb (4.5 kg) while you have symptoms of sciatica. ? Avoid lifting heavy things even when you do not have symptoms. ? Avoid lifting heavy things over and over.  When you lift objects, always lift in a way that is safe for your body. To do this, you should: ? Bend your knees. ? Keep the object close to your body. ? Avoid twisting. General instructions  Stay at a healthy weight.  Wear comfortable shoes that support your feet. Avoid wearing high heels.  Avoid sleeping on a mattress that is too soft or too hard. You might have less pain if you sleep on a mattress that is firm enough to support your back.  Keep all follow-up visits as told by your doctor. This is important. Contact a doctor if:  You have pain that: ? Wakes you up when you are sleeping. ? Gets worse when you lie down. ? Is worse than the pain you have had in the past. ? Lasts longer than 4 weeks.  You lose weight without trying. Get help right away if:  You cannot control when you pee (urinate) or poop (have a bowel movement).  You have weakness in any of these areas and it gets  worse: ? Lower back. ? The area between your hip bones. ? Butt. ? Legs.  You have redness or swelling of your back.  You have a burning feeling when you pee. Summary  Sciatica is pain, weakness, tingling, or loss of feeling (numbness) along the sciatic nerve.  This condition happens when the sciatic nerve is pinched or has pressure put on it.  Sciatica can cause pain, tingling, or loss of feeling (numbness) in the lower back, legs, hips, and butt.  Treatment often includes rest, exercise, medicines, and putting ice or heat on the affected area. This information is not intended to replace advice given to you by your health care provider. Make sure you discuss any questions you have with your health care provider. Document Revised: 03/04/2018 Document Reviewed: 03/04/2018 Elsevier Patient Education  [employer].    If you have lab work done today you will be contacted with your lab results within the next 2 weeks.  If you have not heard from [place] then please contact us. The fastest way to get your results is to register for My Chart.   IF you received an x-ray today, you will receive an invoice  from [facility]. Please contact [facility] at [phone] with questions or concerns regarding your invoice.   IF you received labwork today, you will receive an invoice from [place]. Please contact [facility] at [phone] with questions or concerns regarding your invoice.   Our billing staff will not be able to assist you with questions regarding bills from these companies.  You will be contacted with the lab results as soon as they are available. The fastest way to get your results is to activate your My Chart account. Instructions are located on the last page of this paperwork. If you have not heard from [place] regarding the results in 2 weeks, please contact this office.         Signed, Meredith Staggers, MD [facility]

## 2019-10-30 NOTE — Patient Instructions (Addendum)
Add amlodipine small dose once per day to help with blood pressure control.  Continue hydrochlorothiazide on this.  I will check some lab work and we can discuss cholesterol options at next visit in 3 weeks.  See information below on low back pain.  I suspect you have a pulled muscle or pinched nerve.  Try muscle relaxant up to every 8 hours, ibuprofen over-the-counter temporarily is okay if blood pressure is better with new medicine.  Follow-up next week if not improving, sooner if worse.   If any return of vision symptoms or lightheadedness as you felt yesterday, be seen.  Return to the clinic or go to the nearest emergency room if any of your symptoms worsen or new symptoms occur.   Acute Back Pain, Adult Acute back pain is sudden and usually short-lived. It is often caused by an injury to the muscles and tissues in the back. The injury may result from:  A muscle or ligament getting overstretched or torn (strained). Ligaments are tissues that connect bones to each other. Lifting something improperly can cause a back strain.  Wear and tear (degeneration) of the spinal disks. Spinal disks are circular tissue that provides cushioning between the bones of the spine (vertebrae).  Twisting motions, such as while playing sports or doing yard work.  A hit to the back.  Arthritis. You may have a physical exam, lab tests, and imaging tests to find the cause of your pain. Acute back pain usually goes away with rest and home care. Follow these instructions at home: Managing pain, stiffness, and swelling  Take over-the-counter and prescription medicines only as told by your health care provider.  Your health care provider may recommend applying ice during the first 24-48 hours after your pain starts. To do this: ? Put ice in a plastic bag. ? Place a towel between your skin and the bag. ? Leave the ice on for 20 minutes, 2-3 times a day.  If directed, apply heat to the affected area as often as  told by your health care provider. Use the heat source that your health care provider recommends, such as a moist heat pack or a heating pad. ? Place a towel between your skin and the heat source. ? Leave the heat on for 20-30 minutes. ? Remove the heat if your skin turns bright red. This is especially important if you are unable to feel pain, heat, or cold. You have a greater risk of getting burned. Activity   Do not stay in bed. Staying in bed for more than 1-2 days can delay your recovery.  Sit up and stand up straight. Avoid leaning forward when you sit, or hunching over when you stand. ? If you work at a desk, sit close to it so you do not need to lean over. Keep your chin tucked in. Keep your neck drawn back, and keep your elbows bent at a right angle. Your arms should look like the letter "L." ? Sit high and close to the steering wheel when you drive. Add lower back (lumbar) support to your car seat, if needed.  Take short walks on even surfaces as soon as you are able. Try to increase the length of time you walk each day.  Do not sit, drive, or stand in one place for more than 30 minutes at a time. Sitting or standing for long periods of time can put stress on your back.  Do not drive or use heavy machinery while taking prescription pain  medicine.  Use proper lifting techniques. When you bend and lift, use positions that put less stress on your back: ? [place] your knees. ? Keep the load close to your body. ? Avoid twisting.  Exercise regularly as told by your health care provider. Exercising helps your back heal faster and helps prevent back injuries by keeping muscles strong and flexible.  Work with a physical therapist to make a safe exercise program, as recommended by your health care provider. Do any exercises as told by your physical therapist. Lifestyle  Maintain a healthy weight. Extra weight puts stress on your back and makes it difficult to have good posture.  Avoid  activities or situations that make you feel anxious or stressed. Stress and anxiety increase muscle tension and can make back pain worse. Learn ways to manage anxiety and stress, such as through exercise. General instructions  Sleep on a firm mattress in a comfortable position. Try lying on your side with your knees slightly bent. If you lie on your back, put a pillow under your knees.  Follow your treatment plan as told by your health care provider. This may include: ? Cognitive or behavioral therapy. ? Acupuncture or massage therapy. ? Meditation or yoga. Contact a health care provider if:  You have pain that is not relieved with rest or medicine.  You have increasing pain going down into your legs or buttocks.  Your pain does not improve after 2 weeks.  You have pain at night.  You lose weight without trying.  You have a fever or chills. Get help right away if:  You develop new bowel or bladder control problems.  You have unusual weakness or numbness in your arms or legs.  You develop nausea or vomiting.  You develop abdominal pain.  You feel faint. Summary  Acute back pain is sudden and usually short-lived.  Use proper lifting techniques. When you bend and lift, use positions that put less stress on your back.  Take over-the-counter and prescription medicines and apply heat or ice as directed by your health care provider. This information is not intended to replace advice given to you by your health care provider. Make sure you discuss any questions you have with your health care provider. Document Revised: 06/04/2018 Document Reviewed: 09/27/2016 Elsevier Patient Education  2020 Elsevier [employer].  Sciatica  Sciatica is pain, weakness, tingling, or loss of feeling (numbness) along the sciatic nerve. The sciatic nerve starts in the lower back and goes down the back of each leg. Sciatica usually goes away on its own or with treatment. Sometimes, sciatica may come back  (recur). What are the causes? This condition happens when the sciatic nerve is pinched or has pressure put on it. This may be the result of:  A disk in between the bones of the spine bulging out too far (herniated disk).  Changes in the spinal disks that occur with aging.  A condition that affects a muscle in the butt.  Extra bone growth near the sciatic nerve.  A break (fracture) of the area between your hip bones (pelvis).  Pregnancy.  Tumor. This is rare. What increases the risk? You are more likely to develop this condition if you:  Play sports that put pressure or stress on the spine.  Have poor strength and ease of movement (flexibility).  Have had a back injury in the past.  Have had back surgery.  Sit for long periods of time.  Do activities that involve bending or lifting over  and over again.  Are very overweight (obese). What are the signs or symptoms? Symptoms can vary from mild to very bad. They may include:  Any of these problems in the lower back, leg, hip, or butt: ? Mild tingling, loss of feeling, or dull aches. ? Burning sensations. ? Sharp pains.  Loss of feeling in the back of the calf or the sole of the foot.  Leg weakness.  Very bad back pain that makes it hard to move. These symptoms may get worse when you cough, sneeze, or laugh. They may also get worse when you sit or stand for long periods of time. How is this treated? This condition often gets better without any treatment. However, treatment may include:  Changing or cutting back on physical activity when you have pain.  Doing exercises and stretching.  Putting ice or heat on the affected area.  Medicines that help: ? To relieve pain and swelling. ? To relax your muscles.  Shots (injections) of medicines that help to relieve pain, irritation, and swelling.  Surgery. Follow these instructions at home: Medicines  Take over-the-counter and prescription medicines only as told by  your doctor.  Ask your doctor if the medicine prescribed to you: ? Requires you to avoid driving or using heavy machinery. ? Can cause trouble pooping (constipation). You may need to take these steps to prevent or treat trouble pooping:  Drink enough fluids to keep your pee (urine) pale yellow.  Take over-the-counter or prescription medicines.  Eat foods that are high in fiber. These include beans, whole grains, and fresh fruits and vegetables.  Limit foods that are high in fat and sugar. These include fried or sweet foods. Managing pain      If told, put ice on the affected area. ? Put ice in a plastic bag. ? Place a towel between your skin and the bag. ? Leave the ice on for 20 minutes, 2-3 times a day.  If told, put heat on the affected area. Use the heat source that your doctor tells you to use, such as a moist heat pack or a heating pad. ? Place a towel between your skin and the heat source. ? Leave the heat on for 20-30 minutes. ? Remove the heat if your skin turns bright red. This is very important if you are unable to feel pain, heat, or cold. You may have a greater risk of getting burned. Activity   Return to your normal activities as told by your doctor. Ask your doctor what activities are safe for you.  Avoid activities that make your symptoms worse.  Take short rests during the day. ? When you rest for a long time, do some physical activity or stretching between periods of rest. ? Avoid sitting for a long time without moving. Get up and move around at least one time each hour.  Exercise and stretch regularly, as told by your doctor.  Do not lift anything that is heavier than 10 lb (4.5 kg) while you have symptoms of sciatica. ? Avoid lifting heavy things even when you do not have symptoms. ? Avoid lifting heavy things over and over.  When you lift objects, always lift in a way that is safe for your body. To do this, you should: ? Bend your knees. ? Keep the  object close to your body. ? Avoid twisting. General instructions  Stay at a healthy weight.  Wear comfortable shoes that support your feet. Avoid wearing high heels.  Avoid sleeping  on a mattress that is too soft or too hard. You might have less pain if you sleep on a mattress that is firm enough to support your back.  Keep all follow-up visits as told by your doctor. This is important. Contact a doctor if:  You have pain that: ? Wakes you up when you are sleeping. ? Gets worse when you lie down. ? Is worse than the pain you have had in the past. ? Lasts longer than 4 weeks.  You lose weight without trying. Get help right away if:  You cannot control when you pee (urinate) or poop (have a bowel movement).  You have weakness in any of these areas and it gets worse: ? Lower back. ? The area between your hip bones. ? Butt. ? Legs.  You have redness or swelling of your back.  You have a burning feeling when you pee. Summary  Sciatica is pain, weakness, tingling, or loss of feeling (numbness) along the sciatic nerve.  This condition happens when the sciatic nerve is pinched or has pressure put on it.  Sciatica can cause pain, tingling, or loss of feeling (numbness) in the lower back, legs, hips, and butt.  Treatment often includes rest, exercise, medicines, and putting ice or heat on the affected area. This information is not intended to replace advice given to you by your health care provider. Make sure you discuss any questions you have with your health care provider. Document Revised: 03/04/2018 Document Reviewed: 03/04/2018 Elsevier Patient Education  [employer].    If you have lab work done today you will be contacted with your lab results within the next 2 weeks.  If you have not heard from [place] then please contact us. The fastest way to get your results is to register for My Chart.   IF you received an x-ray today, you will receive an invoice from  [facility]. Please contact [facility] at [phone] with questions or concerns regarding your invoice.   IF you received labwork today, you will receive an invoice from [facility]. Please contact [facility] at [phone] with questions or concerns regarding your invoice.   Our billing staff will not be able to assist you with questions regarding bills from these companies.  You will be contacted with the lab results as soon as they are available. The fastest way to get your results is to activate your My Chart account. Instructions are located on the last page of this paperwork. If you have not heard from [place] regarding the results in 2 weeks, please contact this office.

## 2019-10-31 ENCOUNTER — Encounter: Payer: Self-pay | Admitting: Family Medicine

## 2019-10-31 LAB — COMPREHENSIVE METABOLIC PANEL
ALT: 17 IU/L (ref 0–44)
AST: 20 IU/L (ref 0–40)
Albumin/Globulin Ratio: 1.5 (ref 1.2–2.2)
Albumin: 5 g/dL (ref 4.0–5.0)
Alkaline Phosphatase: 84 IU/L (ref 48–121)
BUN/Creatinine Ratio: 13 (ref 9–20)
BUN: 14 mg/dL (ref 6–24)
Bilirubin Total: 0.4 mg/dL (ref 0.0–1.2)
CO2: 26 mmol/L (ref 20–29)
Calcium: 10.3 mg/dL — ABNORMAL HIGH (ref 8.7–10.2)
Chloride: 100 mmol/L (ref 96–106)
Creatinine, Ser: 1.11 mg/dL (ref 0.76–1.27)
GFR calc Af Amer: 89 mL/min/{1.73_m2} (ref >59)
GFR calc non Af Amer: 77 mL/min/{1.73_m2} (ref >59)
Globulin, Total: 3.4 g/dL (ref 1.5–4.5)
Glucose: 94 mg/dL (ref 65–99)
Potassium: 4.7 mmol/L (ref 3.5–5.2)
Sodium: 140 mmol/L (ref 134–144)
Total Protein: 8.4 g/dL (ref 6.0–8.5)

## 2019-10-31 LAB — LIPID PANEL
Chol/HDL Ratio: 4.4 ratio (ref 0.0–5.0)
Cholesterol, Total: 306 mg/dL — ABNORMAL HIGH (ref 100–199)
HDL: 69 mg/dL (ref >39)
LDL Chol Calc (NIH): 202 mg/dL — ABNORMAL HIGH (ref 0–99)
Triglycerides: 188 mg/dL — ABNORMAL HIGH (ref 0–149)
VLDL Cholesterol Cal: 35 mg/dL (ref 5–40)

## 2019-11-20 ENCOUNTER — Ambulatory Visit: Payer: 59 | Admitting: Family Medicine

## 2019-11-21 ENCOUNTER — Encounter: Payer: Self-pay | Admitting: Family Medicine

## 2019-12-10 ENCOUNTER — Encounter: Payer: Self-pay | Admitting: Gastroenterology

## 2020-01-07 ENCOUNTER — Other Ambulatory Visit: Payer: Self-pay

## 2020-01-07 ENCOUNTER — Encounter (HOSPITAL_COMMUNITY): Payer: Self-pay

## 2020-01-07 ENCOUNTER — Emergency Department (HOSPITAL_COMMUNITY)
Admission: EM | Admit: 2020-01-07 | Discharge: 2020-01-07 | Disposition: A | Discharge status: Home / Self Care | Payer: Self-pay | Attending: Emergency Medicine | Admitting: Emergency Medicine

## 2020-01-07 DIAGNOSIS — Z7982 Long term (current) use of aspirin: Secondary | ICD-10-CM | POA: Insufficient documentation

## 2020-01-07 DIAGNOSIS — Z955 Presence of coronary angioplasty implant and graft: Secondary | ICD-10-CM | POA: Insufficient documentation

## 2020-01-07 DIAGNOSIS — I2510 Atherosclerotic heart disease of native coronary artery without angina pectoris: Secondary | ICD-10-CM | POA: Insufficient documentation

## 2020-01-07 DIAGNOSIS — M545 Low back pain, unspecified: Secondary | ICD-10-CM | POA: Insufficient documentation

## 2020-01-07 DIAGNOSIS — I10 Essential (primary) hypertension: Secondary | ICD-10-CM | POA: Insufficient documentation

## 2020-01-07 DIAGNOSIS — Z79899 Other long term (current) drug therapy: Secondary | ICD-10-CM | POA: Insufficient documentation

## 2020-01-07 DIAGNOSIS — G8929 Other chronic pain: Secondary | ICD-10-CM | POA: Insufficient documentation

## 2020-01-07 DIAGNOSIS — Z87891 Personal history of nicotine dependence: Secondary | ICD-10-CM | POA: Insufficient documentation

## 2020-01-07 MED ORDER — KETOROLAC TROMETHAMINE 30 MG/ML IJ SOLN
30.0000 mg | Freq: Once | INTRAMUSCULAR | Status: AC
  Administered 2020-01-07: 30 mg via INTRAMUSCULAR
  Filled 2020-01-07: qty 1

## 2020-01-07 MED ORDER — DEXAMETHASONE SODIUM PHOSPHATE 10 MG/ML IJ SOLN
8.0000 mg | Freq: Once | INTRAMUSCULAR | Status: AC
  Administered 2020-01-07: 8 mg via INTRAMUSCULAR
  Filled 2020-01-07: qty 1

## 2020-01-07 MED ORDER — METHOCARBAMOL 500 MG PO TABS: 500.0000 mg | ORAL_TABLET | Freq: Two times a day (BID) | ORAL | 0 refills | Status: DC | PRN

## 2020-01-07 NOTE — ED Triage Notes (Signed)
Pt reports lower back pain that radiates to right hip and leg. Pt reports this has happened intermittently over the past few months.

## 2020-01-07 NOTE — Discharge Instructions (Addendum)
You can try a Voltaren gel - this is a topical pain reliever/anti-inflammatory that you can find on the shelf.  OR you can also try a Salonpas with Lidocaine patch.  You can take 600 mg of ibuprofen every 6 hours as needed for pain.   Apply ice to your back for 20 minutes at a time.  You can also apply heat if this provides more relief.   You can take robaxin every 12 hours as needed for muscle spasm.  Be aware this medication can make you drowsy; do not take while driving or drinking alcohol.   Follow-up with your primary care provider. Return to ER if new numbness or tingling in your arms or legs, inability to urinate, inability to hold your bowels, or weakness in your extremities.

## 2020-01-07 NOTE — ED Provider Notes (Signed)
[facility]-EMERGENCY DEPT Provider Note   CSN: [id number] Arrival date & time: 01/07/20  1527     History Chief Complaint  Patient presents with  . Back Pain  . Hip Pain    Darren [name] is a 49 y.o. male w PMHx HTN, CAD, emphysema, presenting to the ED with complaint of worsening right hip pain. Pain initially started 3 years, however worsening over the last few days. Describes pain as constant, sharp, radiates to back of his leg, worse with prolonged sitting. Flexeril used to help however no longer providing relief.  He had a long career in Aeronautical engineer, and now is a Civil Service fast streamer for [employer]. States he frequently is in and out of his car all day and this is worsening his symptoms. No new injuries.  No N/T, weakness, incontinence, fever, chills, hx cancer, hx of IVDU.   The history is provided by the patient.       Past Medical History:  Diagnosis Date  . Anxiety   . Aortic insufficiency    moderate by echo 04/2014  . Coronary artery disease 03/24/2014   NSTEMI s/p PCI of mid LAD and repeat cath with patent stent  . Depression   . Hypertension   . Tobacco abuse     Patient Active Problem List   Diagnosis Date Noted  . Alcohol dependence in remission (HCC) 09/01/2017  . Major depressive disorder, recurrent episode, moderate (HCC) 09/01/2017  . Dyslipidemia 02/01/2017  . Coronary artery disease involving native heart 02/01/2017  . Chest pain in adult 02/01/2017  . Hypertension 02/01/2017  . Pulmonary emphysema (HCC) 03/24/2016  . Angina pectoris (HCC) 11/11/2015  . Aortic insufficiency   . MDD (major depressive disorder) 08/06/2014  . Depression   . Coronary artery disease due to lipid rich plaque   . Essential hypertension 03/04/2014    Past Surgical History:  Procedure Laterality Date  . CARDIAC CATHETERIZATION  03/04/2014   PCI of mid LAD  . CARDIAC CATHETERIZATION N/A 11/12/2015   Procedure: Left Heart Cath and Coronary Angiography;   Surgeon: Tonny Bollman, MD;  Location: [facility] INVASIVE CV LAB;  Service: Cardiovascular;  Laterality: N/A;  . LEFT HEART CATHETERIZATION WITH CORONARY ANGIOGRAM Bilateral 03/04/2014   Procedure: LEFT HEART CATHETERIZATION WITH CORONARY ANGIOGRAM;  Surgeon: Kathleene Hazel, MD;  Location: [facility] CATH LAB;  Service: Cardiovascular;  Laterality: Bilateral;  . LEFT HEART CATHETERIZATION WITH CORONARY ANGIOGRAM N/A 03/26/2014   Procedure: LEFT HEART CATHETERIZATION WITH CORONARY ANGIOGRAM;  Surgeon: Lennette Bihari, MD;  Location: [facility] CATH LAB;  Service: Cardiovascular;  Laterality: N/A;       Family History  Problem Relation Age of Onset  . Hypertension Mother   . Hypertension Father   . Alcohol abuse Father   . Hypertension Other   . Diabetes Other   . Heart attack Other     Social History   Tobacco Use  . Smoking status: Former Smoker    Packs/day: 0.50    Years: 30.00    Pack years: 15.00    Types: Cigarettes    Quit date: 12/29/2015    Years since quitting: 4.0  . Smokeless tobacco: Never Used  Vaping Use  . Vaping Use: Never used  Substance Use Topics  . Alcohol use: No    Alcohol/week: 0.0 standard drinks  . Drug use: Yes    Types: Marijuana    Comment: daily use    Home Medications Prior to Admission medications   Medication Sig Start Date  End Date Taking? Authorizing Provider  albuterol (VENTOLIN HFA) 108 (90 Base) MCG/ACT inhaler Inhale 2 puffs into the lungs every 6 (six) hours as needed for wheezing or shortness of breath. 12/25/18   Doristine Bosworth, MD  amLODipine (NORVASC) 2.5 MG tablet Take 2 tablets (5 mg total) by mouth daily. 10/30/19   Shade Flood, MD  aspirin EC 81 MG tablet Take 81 mg by mouth.    [provider]  cyclobenzaprine (FLEXERIL) 5 MG tablet 1 pill by mouth up to every 8 hours as needed. Start with one pill by mouth each bedtime as needed due to sedation 10/30/19   Shade Flood, MD  hydrochlorothiazide (HYDRODIURIL) 25 MG tablet  Take 1 tablet (25 mg total) by mouth daily. 10/30/19   Shade Flood, MD  methocarbamol (ROBAXIN) 500 MG tablet Take 1 tablet (500 mg total) by mouth 2 (two) times daily as needed for muscle spasms. 01/07/20   Seham Gardenhire, [place] N, PA-C  mirtazapine (REMERON) 15 MG tablet Take 1 tablet (15 mg total) by mouth at bedtime. 12/25/18 12/25/19  Doristine Bosworth, MD  nitroGLYCERIN (NITROSTAT) 0.4 MG SL tablet Place 1 tablet (0.4 mg total) under the tongue every 5 (five) minutes as needed for chest pain. 09/01/17   Doristine Bosworth, MD  sertraline (ZOLOFT) 100 MG tablet Take 2 tablets (200 mg total) by mouth daily. 12/25/18 12/25/19  Doristine Bosworth, MD    Allergies    Hydrocodone  Review of Systems   Review of Systems  Constitutional: Negative for fever.  Genitourinary: Negative for difficulty urinating.  Musculoskeletal: Positive for arthralgias and back pain.  Neurological: Negative for weakness and numbness.  All other systems reviewed and are negative.   Physical Exam Updated Vital Signs BP (!) 154/79 (BP Location: Left Arm)   Pulse 67   Temp 98.4 F (36.9 C) (Oral)   Resp 16   Ht 5\' 7"  (1.702 m)   Wt 72.6 kg   SpO2 100%   BMI 25.06 kg/m   Physical Exam Vitals and nursing note reviewed.  Constitutional:      Appearance: He is well-developed.  HENT:     Head: Normocephalic and atraumatic.  Eyes:     Conjunctiva/sclera: Conjunctivae normal.  Cardiovascular:     Rate and Rhythm: Normal rate and regular rhythm.  Pulmonary:     Effort: Pulmonary effort is normal.     Breath sounds: Normal breath sounds.  Musculoskeletal:     Comments: TTP to right gluteal region and region of greater trochanter of the right femur. Positive straight leg raise.  No midline spinal or paraspinal TTP.   Neurological:     Mental Status: He is alert.     Comments: Normal tone.  5/5 strength in BLE including strong and equal dorsiflexion/plantar flexion Sensory: light touch normal in BLE  extremities.   2+ patellar reflexes b/l Gait: normal gait and balance CV: distal pulses palpable throughout    Psychiatric:        Mood and Affect: Mood normal.        Behavior: Behavior normal.     ED Results / Procedures / Treatments   Labs (all labs ordered are listed, but only abnormal results are displayed) Labs Reviewed - No data to display  EKG None  Radiology No results found.  Procedures Procedures (including critical care time)  Medications Ordered in ED Medications  dexamethasone (DECADRON) injection 8 mg (8 mg Intramuscular Given 01/07/20 1838)  ketorolac (TORADOL) 30  MG/ML injection 30 mg (30 mg Intramuscular Given 01/07/20 1841)    ED Course  I have reviewed the triage vital signs and the nursing notes.  Pertinent labs & imaging results that were available during my care of the patient were reviewed by me and considered in my medical decision making (see chart for details).    MDM Rules/Calculators/A&P                          Patient with exacerbation of chronic low back pain attributed to sciatica.  He has pain to the right gluteal region radiating down the posterior thigh.  He also has some tenderness over the greater trochanter on the right, concerning for possible trochanteric bursitis.  Neurovascular intact, no red flags.  Suspect patient's exacerbation is secondary to his repetitive movements with his job.  Recommend rest, ice, NSAIDs.  We will also prescribe muscle relaxer.  Also discussed over-the-counter topical medications such as Voltaren or Salonpas with lidocaine patches for symptom relief.  Encourage close PCP follow-up and discussed strict return precautions.  Patient verbalized understanding agrees with care plan.  Final Clinical Impression(s) / ED Diagnoses Final diagnoses:  Acute exacerbation of chronic low back pain    Rx / DC Orders ED Discharge Orders         Ordered    methocarbamol (ROBAXIN) 500 MG tablet  2 times daily PRN          01/07/20 1834           Fischer Halley, [place] N, PA-C 01/07/20 2141    Bethann Berkshire, MD 01/07/20 2248

## 2020-01-14 ENCOUNTER — Telehealth: Payer: Self-pay | Admitting: Cardiology

## 2020-01-14 NOTE — Telephone Encounter (Signed)
Patient has not been seen in the office since 2018. Spoke with the patient and made him an appointment to see Dr. Mayford Knife so that testing can be ordered if needed.

## 2020-01-14 NOTE — Telephone Encounter (Signed)
Patient's girlfriend calling requesting a stress test order be put in for the patient. 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

## 2020-01-19 ENCOUNTER — Encounter: Payer: Self-pay | Admitting: Family Medicine

## 2020-01-27 ENCOUNTER — Telehealth: Payer: Self-pay | Admitting: *Deleted

## 2020-01-27 NOTE — Telephone Encounter (Signed)
Patient no showed PV today- Called patient and left message to return call by 5 pm today- If no call by 5 pm, PV and procedure will be canceled - no call at 5 pm -  PV and Procedure both canceled- No Show letter mailed to patient  

## 2020-01-30 ENCOUNTER — Other Ambulatory Visit: Payer: Self-pay | Admitting: Family Medicine

## 2020-01-30 DIAGNOSIS — I10 Essential (primary) hypertension: Secondary | ICD-10-CM

## 2020-02-03 ENCOUNTER — Encounter: Payer: Self-pay | Admitting: Cardiology

## 2020-02-03 ENCOUNTER — Other Ambulatory Visit: Payer: Self-pay

## 2020-02-03 ENCOUNTER — Ambulatory Visit (INDEPENDENT_AMBULATORY_CARE_PROVIDER_SITE_OTHER): Payer: Self-pay | Admitting: Cardiology

## 2020-02-03 VITALS — BP 158/88 | HR 79 | Ht 67.0 in | Wt 165.0 lb

## 2020-02-03 DIAGNOSIS — I351 Nonrheumatic aortic (valve) insufficiency: Secondary | ICD-10-CM

## 2020-02-03 DIAGNOSIS — E785 Hyperlipidemia, unspecified: Secondary | ICD-10-CM

## 2020-02-03 DIAGNOSIS — I10 Essential (primary) hypertension: Secondary | ICD-10-CM

## 2020-02-03 DIAGNOSIS — I2510 Atherosclerotic heart disease of native coronary artery without angina pectoris: Secondary | ICD-10-CM

## 2020-02-03 NOTE — Progress Notes (Signed)
Cardiology Office Note    Date:  02/03/2020   ID:  Darren [name], DOB 1970-10-05, MRN [id number]  PCP:  Shade Flood, MD  Cardiologist: Dr. Mayford Knife  No chief complaint on file.   History of Present Illness:  Darren [name] is a 49 y.o. male with history of CAD status post NSTEMI treated with DES to the LAD 02/2014, hypertension, tobacco abuse.  Repeat cardiac catheterization 11/12/15 no restenosis of the stent and normal LV function on echo.  Patient went to [facility] 02/01/17 with chest pain and shortness of breath that lasted 2 hours.  Telemetry showed bradycardia down to 43 at night, troponins negative x3 potassium normal creatinine normal.  Nuclear stress test 02/02/17 was normal EF 55%.  2D echo 02/02/17 normal LVEF 55-60% moderate AI.  He was treated with indomethacin and colchicine.  He is here today for followup and is doing well.  He denies any chest pain or pressure, SOB, DOE, PND, orthopnea, LE edema, dizziness, palpitations or syncope. He is compliant with his meds and is tolerating meds with no SE.  He has completely quit smoking.  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    Past Medical History:  Diagnosis Date  . Anxiety   . Aortic insufficiency    moderate by echo 04/2014  . Coronary artery disease 03/24/2014   NSTEMI s/p PCI of mid LAD and repeat cath with patent stent  . Depression   . Hypertension   . Tobacco abuse     Past Surgical History:  Procedure Laterality Date  . CARDIAC CATHETERIZATION  03/04/2014   PCI of mid LAD  . CARDIAC CATHETERIZATION N/A 11/12/2015   Procedure: Left Heart Cath and Coronary Angiography;  Surgeon: Tonny Bollman, MD;  Location: [facility] INVASIVE CV LAB;  Service: Cardiovascular;  Laterality: N/A;  . LEFT HEART CATHETERIZATION WITH CORONARY ANGIOGRAM Bilateral 03/04/2014   Procedure: LEFT HEART CATHETERIZATION WITH CORONARY ANGIOGRAM;  Surgeon: Kathleene Hazel, MD;  Location: [facility] CATH LAB;   Service: Cardiovascular;  Laterality: Bilateral;  . LEFT HEART CATHETERIZATION WITH CORONARY ANGIOGRAM N/A 03/26/2014   Procedure: LEFT HEART CATHETERIZATION WITH CORONARY ANGIOGRAM;  Surgeon: Lennette Bihari, MD;  Location: [facility] CATH LAB;  Service: Cardiovascular;  Laterality: N/A;    Current Medications: Current Meds  Medication Sig  . albuterol (VENTOLIN HFA) 108 (90 Base) MCG/ACT inhaler Inhale 2 puffs into the lungs every 6 (six) hours as needed for wheezing or shortness of breath.  Marland Kitchen amLODipine (NORVASC) 2.5 MG tablet Take 2 tablets by mouth once daily  . aspirin EC 81 MG tablet Take 81 mg by mouth.  . hydrochlorothiazide (HYDRODIURIL) 25 MG tablet Take 1 tablet (25 mg total) by mouth daily.  . methocarbamol (ROBAXIN) 500 MG tablet Take 1 tablet (500 mg total) by mouth 2 (two) times daily as needed for muscle spasms.  . nitroGLYCERIN (NITROSTAT) 0.4 MG SL tablet Place 1 tablet (0.4 mg total) under the tongue every 5 (five) minutes as needed for chest pain.     Allergies:   Hydrocodone   Social History   Socioeconomic History  . Marital status: Legally Separated    Spouse name: Not on file  . Number of children: Not on file  . Years of education: Not on file  . Highest education level: Not on file  Occupational History  . Not on file  Tobacco Use  . Smoking status: Former Smoker    Packs/day:  0.50    Years: 30.00    Pack years: 15.00    Types: Cigarettes    Quit date: 12/29/2015    Years since quitting: 4.1  . Smokeless tobacco: Never Used  Vaping Use  . Vaping Use: Never used  Substance and Sexual Activity  . Alcohol use: No    Alcohol/week: 0.0 standard drinks  . Drug use: Yes    Types: Marijuana    Comment: daily use  . Sexual activity: Not Currently    Birth control/protection: Condom  Other Topics Concern  . Not on file  Social History Narrative  . Not on file   Social Determinants of Health   Financial Resource Strain:   . Difficulty of Paying Living  Expenses: Not on file  Food Insecurity:   . Worried About Programme researcher, broadcasting/film/video in the Last Year: Not on file  . Ran Out of Food in the Last Year: Not on file  Transportation Needs:   . Lack of Transportation (Medical): Not on file  . Lack of Transportation (Non-Medical): Not on file  Physical Activity:   . Days of Exercise per Week: Not on file  . Minutes of Exercise per Session: Not on file  Stress:   . Feeling of Stress : Not on file  Social Connections:   . Frequency of Communication with Friends and Family: Not on file  . Frequency of Social Gatherings with Friends and Family: Not on file  . Attends Religious Services: Not on file  . Active Member of Clubs or Organizations: Not on file  . Attends Banker Meetings: Not on file  . Marital Status: Not on file     Family History:  The patient's family history includes Alcohol abuse in his father; Diabetes in an other family member; Heart attack in an other family member; Hypertension in his father, mother, and another family member.   ROS:   Please see the history of present illness.     All other systems reviewed and are negative.   PHYSICAL EXAM:   VS:  BP (!) 158/88   Pulse 79   Ht 5\' 7"  (1.702 m)   Wt 165 lb (74.8 kg)   SpO2 98%   BMI 25.84 kg/m   Physical Exam Skin:    Findings: No erythema.     GEN: Well nourished, well developed in no acute distress HEENT: Normal NECK: No JVD; No carotid bruits LYMPHATICS: No lymphadenopathy CARDIAC:RRR, no murmurs, rubs, gallops RESPIRATORY:  Clear to auscultation without rales, wheezing or rhonchi  ABDOMEN: Soft, non-tender, non-distended MUSCULOSKELETAL:  No edema; No deformity  SKIN: Warm and dry NEUROLOGIC:  Alert and oriented x 3 PSYCHIATRIC:  Normal affect   Wt Readings from Last 3 Encounters:  02/03/20 165 lb (74.8 kg)  01/07/20 160 lb (72.6 kg)  10/30/19 164 lb (74.4 kg)      Studies/Labs Reviewed:   EKG:  EKG is  ordered today.  EKG shows NSR  with LVH and repol abnormality  Recent Labs: 10/30/2019: ALT 17; BUN 14; Creatinine, Ser 1.11; Potassium 4.7; Sodium 140   Lipid Panel    Component Value Date/Time   CHOL 306 (H) 10/30/2019 1527   TRIG 188 (H) 10/30/2019 1527   HDL 69 10/30/2019 1527   CHOLHDL 4.4 10/30/2019 1527   CHOLHDL 3.0 11/12/2015 0942   VLDL 11 11/12/2015 0942   LDLCALC 202 (H) 10/30/2019 1527    Additional studies/ records that were reviewed today include:   Nuc Stress  test 02/02/2017 [facility] Summary:  1. Perfusion at stress and rest conditions was normal. 2. LV EF was 55%. 3. No change in abnormal baseline ECG or 4/10 chest pain with stress.  Echocardiogram 02/02/2017 Summary:  1. Left ventricle septal thickness is mildly increased. 2. The left ventricle mass index and the relative wall thickness values indicate concentric hypertrophy. 3. The EF is estimated at 55-60%. 4. Left ventricular diastolic function is indeterminate on current study. 5. Moderate aortic regurgitation is present. 6. There is trivial aortic valve stenosis present.   There trivial gradient may be related to increased flow from the aortic regurgitation.  There appears to be adequate leaflet excursion. 7. There is a trivial amount of mitral regurgitation. 8. There is trivial tricuspid regurgitation. 9. There is a trivial amount of pulmonic regurgitation. 10. The right ventricular systolic pressure could not be calculated. 11. No pulmonary hypertension is noted. 12. There is no dilatation of the ascending aorta. The ascending aorta is not well visualized above the sinus of valsava.     ASSESSMENT:    1. Coronary artery disease involving native coronary artery of native heart without angina pectoris   2. Primary hypertension   3. Hyperlipidemia LDL goal <70      PLAN:  In order of problems listed above:  1.  ASCAD -s/p NSTEMI treated with DES to the LAD 02/2014 -no restenosis on repeat cath 2017 -normal nuclear stress  test 02/02/17 at [facility]. 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  Essential hypertension  -Bp borderline elevated today but he just took his BP meds -continue amlodipine 2.5mg  daily, HCTZ 25mg  daily  3.  Moderate aortic regurgitation  -repeat echo to make sure this is stable  4.  Tobacco abuse  -he has quit   Medication Adjustments/Labs and Tests Ordered: Current medicines are reviewed at length with the patient today.  Concerns regarding medicines are outlined above.  Medication changes, Labs and Tests ordered today are listed in the Patient Instructions below. There are no Patient Instructions on file for this visit.   Signed, , MD  02/03/2020 11:27 AM    [facility] [address] [place], [place], [place]  [place] Phone: [phone]; Fax: [phone]

## 2020-02-03 NOTE — Progress Notes (Signed)
This encounter was created in error - please disregard.

## 2020-02-03 NOTE — Patient Instructions (Signed)
Medication Instructions:  Your physician recommends that you continue on your current medications as directed. Please refer to the Current Medication list given to you today.  *If you need a refill on your cardiac medications before your next appointment, please call your pharmacy*   Testing/Procedures: Your physician has requested that you have an echocardiogram. Echocardiography is a painless test that uses sound waves to create images of your heart. It provides your doctor with information about the size and shape of your heart and how well your heart's chambers and valves are working. This procedure takes approximately one hour. There are no restrictions for this procedure.  Your physician has requested that you have en exercise stress myoview. For further information please visit [email or web address] Please follow instruction sheet, as given.  Follow-Up: At [facility], you and your health needs are our priority.  As part of our continuing mission to provide you with exceptional heart care, we have created designated Provider Care Teams.  These Care Teams include your primary Cardiologist (physician) and Advanced Practice Providers (APPs -  Physician Assistants and Nurse Practitioners) who all work together to provide you with the care you need, when you need it.   Your next appointment:   1 year(s)  The format for your next appointment:   In Person  Provider:   You may see Armanda Magic, MD or one of the following Advanced Practice Providers on your designated Care Team:    Ronie Spies, PA-C  Jacolyn Reedy, PA-C

## 2020-02-03 NOTE — Addendum Note (Signed)
Addended by: Theresia Majors on: 02/03/2020 11:37 AM   Modules accepted: Orders

## 2020-02-10 ENCOUNTER — Encounter: Payer: Self-pay | Admitting: Gastroenterology

## 2020-02-11 NOTE — Addendum Note (Signed)
Addended by: Armanda Magic R on: 02/11/2020 02:51 PM   Modules accepted: Orders

## 2020-03-04 ENCOUNTER — Telehealth (HOSPITAL_COMMUNITY): Payer: Self-pay

## 2020-03-04 NOTE — Telephone Encounter (Signed)
Spoke with the patient, he stated that he understood and would be here for his test. Asked to call back with any questions. S.Benjamen Koelling EMTP 

## 2020-03-05 ENCOUNTER — Other Ambulatory Visit (HOSPITAL_COMMUNITY)
Admission: RE | Admit: 2020-03-05 | Discharge: 2020-03-05 | Disposition: A | Discharge status: Home / Self Care | Payer: Self-pay | Source: Ambulatory Visit | Attending: Cardiology | Admitting: Cardiology

## 2020-03-05 DIAGNOSIS — Z01812 Encounter for preprocedural laboratory examination: Secondary | ICD-10-CM | POA: Insufficient documentation

## 2020-03-05 DIAGNOSIS — U071 COVID-19: Secondary | ICD-10-CM | POA: Insufficient documentation

## 2020-03-05 LAB — SARS CORONAVIRUS 2 (TAT 6-24 HRS): SARS Coronavirus 2: NEGATIVE

## 2020-03-09 ENCOUNTER — Other Ambulatory Visit (HOSPITAL_COMMUNITY): Payer: Self-pay

## 2020-03-09 ENCOUNTER — Encounter (HOSPITAL_COMMUNITY): Payer: Self-pay

## 2020-03-23 ENCOUNTER — Telehealth (HOSPITAL_COMMUNITY): Payer: Self-pay

## 2020-03-23 NOTE — Telephone Encounter (Signed)
Detailed instructions left on the patient's answering machine. Asked to call back with any questions. S.Jaleeya Mcnelly EMTP 

## 2020-03-25 ENCOUNTER — Ambulatory Visit (HOSPITAL_BASED_OUTPATIENT_CLINIC_OR_DEPARTMENT_OTHER): Payer: Self-pay

## 2020-03-25 ENCOUNTER — Other Ambulatory Visit: Payer: Self-pay

## 2020-03-25 ENCOUNTER — Ambulatory Visit (HOSPITAL_COMMUNITY): Payer: Self-pay | Attending: Cardiology

## 2020-03-25 ENCOUNTER — Encounter: Payer: Self-pay | Admitting: Cardiology

## 2020-03-25 DIAGNOSIS — E785 Hyperlipidemia, unspecified: Secondary | ICD-10-CM | POA: Insufficient documentation

## 2020-03-25 DIAGNOSIS — I2510 Atherosclerotic heart disease of native coronary artery without angina pectoris: Secondary | ICD-10-CM

## 2020-03-25 DIAGNOSIS — I10 Essential (primary) hypertension: Secondary | ICD-10-CM | POA: Insufficient documentation

## 2020-03-25 DIAGNOSIS — I351 Nonrheumatic aortic (valve) insufficiency: Secondary | ICD-10-CM

## 2020-03-25 LAB — MYOCARDIAL PERFUSION IMAGING
Estimated workload: 9.3 METS
Exercise duration (min): 7 min
Exercise duration (sec): 24 s
LV dias vol: 126 mL (ref 62–150)
LV sys vol: 53 mL
MPHR: 170 {beats}/min
Peak HR: 151 {beats}/min
Percent HR: 88 %
Rest HR: 70 {beats}/min
SDS: 0
SRS: 0
SSS: 0
TID: 0.88

## 2020-03-25 LAB — ECHOCARDIOGRAM COMPLETE
AR max vel: 2.08 cm2
AV Area VTI: 2.24 cm2
AV Area mean vel: 2.14 cm2
AV Mean grad: 9.4 mmHg
AV Peak grad: 16.7 mmHg
Ao pk vel: 2.04 m/s
Area-P 1/2: 4.39 cm2
Height: 67 in
P 1/2 time: 484 msec
S' Lateral: 3.2 cm
Weight: 2640 oz

## 2020-03-25 MED ORDER — TECHNETIUM TC 99M TETROFOSMIN IV KIT
26.3000 | PACK | Freq: Once | INTRAVENOUS | Status: AC | PRN
  Administered 2020-03-25: 26.3 via INTRAVENOUS
  Filled 2020-03-25: qty 27

## 2020-03-25 MED ORDER — TECHNETIUM TC 99M TETROFOSMIN IV KIT
9.0000 | PACK | Freq: Once | INTRAVENOUS | Status: AC | PRN
  Administered 2020-03-25: 9 via INTRAVENOUS
  Filled 2020-03-25: qty 9

## 2020-04-01 ENCOUNTER — Telehealth: Payer: Self-pay

## 2020-04-01 DIAGNOSIS — I351 Nonrheumatic aortic (valve) insufficiency: Secondary | ICD-10-CM

## 2020-04-01 NOTE — Telephone Encounter (Signed)
-----   Message from Quintella Reichert, MD sent at 03/25/2020  4:55 PM EST ----- 2D echo showed normal heart function with moderately thickened heart muscle, moderate to severe AI.  LVF is preserved and LV dimensions stable.  Repeat 2D echo in 6 months for reassessment

## 2020-04-01 NOTE — Telephone Encounter (Signed)
Left message for patient to call back. Orders placed for repeat echo in 6 months.

## 2020-04-06 NOTE — Telephone Encounter (Signed)
Pt is returning call.  

## 2020-04-06 NOTE — Telephone Encounter (Signed)
The patient has been notified of the result and verbalized understanding.  All questions (if any) were answered. Theresia Majors, RN 04/06/2020 2:05 PM

## 2020-05-19 ENCOUNTER — Other Ambulatory Visit: Payer: Self-pay | Admitting: Family Medicine

## 2020-05-19 DIAGNOSIS — I10 Essential (primary) hypertension: Secondary | ICD-10-CM

## 2020-05-19 NOTE — Telephone Encounter (Signed)
Courtesy refill. Called patient to schedule appt. No answer, left voicemail to call clinic to schedule appt.

## 2020-06-14 ENCOUNTER — Other Ambulatory Visit: Payer: Self-pay | Admitting: Family Medicine

## 2020-06-14 DIAGNOSIS — I10 Essential (primary) hypertension: Secondary | ICD-10-CM

## 2020-08-10 ENCOUNTER — Encounter (HOSPITAL_COMMUNITY): Payer: Self-pay

## 2020-08-10 ENCOUNTER — Other Ambulatory Visit: Payer: Self-pay

## 2020-08-10 ENCOUNTER — Emergency Department (HOSPITAL_COMMUNITY)
Admission: EM | Admit: 2020-08-10 | Discharge: 2020-08-10 | Disposition: A | Discharge status: Home / Self Care | Payer: Self-pay | Attending: Emergency Medicine | Admitting: Emergency Medicine

## 2020-08-10 DIAGNOSIS — K119 Disease of salivary gland, unspecified: Secondary | ICD-10-CM | POA: Insufficient documentation

## 2020-08-10 DIAGNOSIS — I10 Essential (primary) hypertension: Secondary | ICD-10-CM | POA: Insufficient documentation

## 2020-08-10 DIAGNOSIS — I2510 Atherosclerotic heart disease of native coronary artery without angina pectoris: Secondary | ICD-10-CM | POA: Insufficient documentation

## 2020-08-10 DIAGNOSIS — Z955 Presence of coronary angioplasty implant and graft: Secondary | ICD-10-CM | POA: Insufficient documentation

## 2020-08-10 DIAGNOSIS — Z7982 Long term (current) use of aspirin: Secondary | ICD-10-CM | POA: Insufficient documentation

## 2020-08-10 DIAGNOSIS — Z87891 Personal history of nicotine dependence: Secondary | ICD-10-CM | POA: Insufficient documentation

## 2020-08-10 DIAGNOSIS — Z79899 Other long term (current) drug therapy: Secondary | ICD-10-CM | POA: Insufficient documentation

## 2020-08-10 NOTE — ED Provider Notes (Signed)
[facility]EMERGENCY DEPT Provider Note   CSN: [id number] Arrival date & time: 08/10/20  0383     History Chief Complaint  Patient presents with   Jaw Pain    Darren [name] is a 50 y.o. male.  50 year old male presents with complaint of right submandibular swelling.  Patient states that he went to eat earlier today and noticed swelling under his jaw which seemed to improve however returned when he went to eat dinner tonight.  Denies any fevers or tenderness, denies dental pain.  No other complaints or concerns.  Former smoker, quit 4 years ago.      Past Medical History:  Diagnosis Date   Anxiety    Aortic insufficiency    moderate to severe by echo 02/2020   Coronary artery disease 03/24/2014   NSTEMI s/p PCI of mid LAD and repeat cath with patent stent   Depression    Hypertension    Tobacco abuse     Patient Active Problem List   Diagnosis Date Noted   Alcohol dependence in remission (HCC) 09/01/2017   Major depressive disorder, recurrent episode, moderate (HCC) 09/01/2017   Dyslipidemia 02/01/2017   Coronary artery disease involving native heart 02/01/2017   Chest pain in adult 02/01/2017   Hypertension 02/01/2017   Pulmonary emphysema (HCC) 03/24/2016   Angina pectoris (HCC) 11/11/2015   Aortic insufficiency    MDD (major depressive disorder) 08/06/2014   Depression    Coronary artery disease due to lipid rich plaque    Essential hypertension 03/04/2014    Past Surgical History:  Procedure Laterality Date   CARDIAC CATHETERIZATION  03/04/2014   PCI of mid LAD   CARDIAC CATHETERIZATION N/A 11/12/2015   Procedure: Left Heart Cath and Coronary Angiography;  Surgeon: Tonny Bollman, MD;  Location: [facility] INVASIVE CV LAB;  Service: Cardiovascular;  Laterality: N/A;   LEFT HEART CATHETERIZATION WITH CORONARY ANGIOGRAM Bilateral 03/04/2014   Procedure: LEFT HEART CATHETERIZATION WITH CORONARY ANGIOGRAM;  Surgeon: Kathleene Hazel, MD;   Location: [facility] CATH LAB;  Service: Cardiovascular;  Laterality: Bilateral;   LEFT HEART CATHETERIZATION WITH CORONARY ANGIOGRAM N/A 03/26/2014   Procedure: LEFT HEART CATHETERIZATION WITH CORONARY ANGIOGRAM;  Surgeon: Lennette Bihari, MD;  Location: [facility] CATH LAB;  Service: Cardiovascular;  Laterality: N/A;       Family History  Problem Relation Age of Onset   Hypertension Mother    Hypertension Father    Alcohol abuse Father    Hypertension Other    Diabetes Other    Heart attack Other     Social History   Tobacco Use   Smoking status: Former    Packs/day: 0.50    Years: 30.00    Pack years: 15.00    Types: Cigarettes    Quit date: 12/29/2015    Years since quitting: 4.6   Smokeless tobacco: Never  Vaping Use   Vaping Use: Never used  Substance Use Topics   Alcohol use: No    Alcohol/week: 0.0 standard drinks   Drug use: Yes    Types: Marijuana    Comment: daily use    Home Medications Prior to Admission medications   Medication Sig Start Date End Date Taking? Authorizing Provider  albuterol (VENTOLIN HFA) 108 (90 Base) MCG/ACT inhaler Inhale 2 puffs into the lungs every 6 (six) hours as needed for wheezing or shortness of breath. 12/25/18   Doristine Bosworth, MD  amLODipine (NORVASC) 2.5 MG tablet Take 2 tablets by mouth once daily 05/19/20  Shade Flood, MD  aspirin EC 81 MG tablet Take 81 mg by mouth.    [provider]  hydrochlorothiazide (HYDRODIURIL) 25 MG tablet Take 1 tablet (25 mg total) by mouth daily. 10/30/19   Shade Flood, MD  methocarbamol (ROBAXIN) 500 MG tablet Take 1 tablet (500 mg total) by mouth 2 (two) times daily as needed for muscle spasms. 01/07/20   Robinson, [place] N, PA-C  mirtazapine (REMERON) 15 MG tablet Take 1 tablet (15 mg total) by mouth at bedtime. 12/25/18 12/25/19  Doristine Bosworth, MD  nitroGLYCERIN (NITROSTAT) 0.4 MG SL tablet Place 1 tablet (0.4 mg total) under the tongue every 5 (five) minutes as needed for chest pain.  09/01/17   Doristine Bosworth, MD  sertraline (ZOLOFT) 100 MG tablet Take 2 tablets (200 mg total) by mouth daily. 12/25/18 12/25/19  Doristine Bosworth, MD    Allergies    Hydrocodone  Review of Systems   Review of Systems  Constitutional:  Negative for fever.  HENT:  Positive for facial swelling. Negative for congestion, dental problem, ear pain, trouble swallowing and voice change.   Musculoskeletal:  Negative for neck pain and neck stiffness.  Skin:  Negative for rash and wound.  Allergic/Immunologic: Negative for immunocompromised state.  Hematological:  Negative for adenopathy.   Physical Exam Updated Vital Signs BP 139/79 (BP Location: Right Arm)   Pulse 60   Temp 97.9 F (36.6 C) (Oral)   Resp 16   Ht 5\' 7"  (1.702 m)   Wt 74.8 kg   SpO2 100%   BMI 25.84 kg/m   Physical Exam Vitals and nursing note reviewed.  Constitutional:      General: He is not in acute distress.    Appearance: He is well-developed. He is not diaphoretic.  HENT:     Head: Normocephalic and atraumatic.     Jaw: No trismus.      Nose: Nose normal.     Mouth/Throat:     Mouth: Mucous membranes are moist.     Pharynx: No oropharyngeal exudate or posterior oropharyngeal erythema.  Eyes:     Conjunctiva/sclera: Conjunctivae normal.  Pulmonary:     Effort: Pulmonary effort is normal.  Musculoskeletal:     Cervical back: Neck supple.  Skin:    General: Skin is warm and dry.     Findings: No erythema or rash.  Neurological:     Mental Status: He is alert and oriented to person, place, and time.  Psychiatric:        Behavior: Behavior normal.    ED Results / Procedures / Treatments   Labs (all labs ordered are listed, but only abnormal results are displayed) Labs Reviewed - No data to display  EKG None  Radiology No results found.  Procedures Procedures   Medications Ordered in ED Medications - No data to display  ED Course  I have reviewed the triage vital signs and the nursing  notes.  Pertinent labs & imaging results that were available during my care of the patient were reviewed by me and considered in my medical decision making (see chart for details).  Clinical Course as of 08/10/20 1950  Tue Aug 10, 2020  [id number] 50 year old male with submandibular swelling associated with food.  Suspect salivary gland dysfunction.  He is afebrile, doubt infectious process.  Discussed with Dr. [id number], ER attending.  Plan is for gentle massage and warm compress to the area and recommend limited candy or similar.  Referred to  ENT for follow-up if not improving. [LM]    Clinical Course User Index [LM] Alden Hipp   MDM Rules/Calculators/A&P                           Final Clinical Impression(s) / ED Diagnoses Final diagnoses:  Salivary gland dysfunction    Rx / DC Orders ED Discharge Orders     None        Alden Hipp 08/10/20 1950    Lorre Nick, MD 08/12/20 2250

## 2020-08-10 NOTE — Discharge Instructions (Addendum)
Recommend warm compresses, gentle massage, lemon head candies. Follow up with ENT if not improving, return to the ER for worsening or concerning symptoms.

## 2020-08-10 NOTE — ED Triage Notes (Signed)
Pt c/o R sided jaw/neck pain starting last night after eating dinner. States it is has increased in swelling throughout the day today. Denies fevers

## 2020-08-18 ENCOUNTER — Encounter (HOSPITAL_COMMUNITY): Payer: Self-pay | Admitting: Cardiology

## 2020-08-31 ENCOUNTER — Ambulatory Visit: Payer: Self-pay | Admitting: Physician Assistant

## 2020-08-31 ENCOUNTER — Other Ambulatory Visit: Payer: Self-pay

## 2020-08-31 ENCOUNTER — Encounter: Payer: Self-pay | Admitting: Physician Assistant

## 2020-08-31 VITALS — BP 148/84 | HR 70 | Temp 98.2°F | Resp 18 | Ht 67.0 in | Wt 162.0 lb

## 2020-08-31 DIAGNOSIS — I252 Old myocardial infarction: Secondary | ICD-10-CM

## 2020-08-31 DIAGNOSIS — I2583 Coronary atherosclerosis due to lipid rich plaque: Secondary | ICD-10-CM

## 2020-08-31 DIAGNOSIS — I2510 Atherosclerotic heart disease of native coronary artery without angina pectoris: Secondary | ICD-10-CM

## 2020-08-31 DIAGNOSIS — Z125 Encounter for screening for malignant neoplasm of prostate: Secondary | ICD-10-CM

## 2020-08-31 DIAGNOSIS — E785 Hyperlipidemia, unspecified: Secondary | ICD-10-CM

## 2020-08-31 DIAGNOSIS — I10 Essential (primary) hypertension: Secondary | ICD-10-CM

## 2020-08-31 DIAGNOSIS — F331 Major depressive disorder, recurrent, moderate: Secondary | ICD-10-CM

## 2020-08-31 MED ORDER — HYDROCHLOROTHIAZIDE 25 MG PO TABS: 25.0000 mg | ORAL_TABLET | Freq: Every day | ORAL | 1 refills | Status: DC

## 2020-08-31 MED ORDER — AMLODIPINE BESYLATE 5 MG PO TABS: 5.0000 mg | ORAL_TABLET | Freq: Every day | ORAL | 0 refills | Status: DC

## 2020-08-31 MED ORDER — MIRTAZAPINE 15 MG PO TABS: 15.0000 mg | ORAL_TABLET | Freq: Every day | ORAL | 3 refills | Status: DC

## 2020-08-31 MED ORDER — SERTRALINE HCL 100 MG PO TABS: 200.0000 mg | ORAL_TABLET | Freq: Every day | ORAL | 3 refills | Status: DC

## 2020-08-31 NOTE — Progress Notes (Signed)
Patient has not taken medication today- patient has been out for 1 week. Patient has eaten today. Patient request refills on medications. Patient denies N/V, HA, dizziness.

## 2020-08-31 NOTE — Progress Notes (Signed)
Established Patient Office Visit  Subjective:  Patient ID: Darren [name], male    DOB: 03/10/1970  Age: 50 y.o. MRN: [id number]  CC:  Chief Complaint  Patient presents with   Medication Refill    BP    HPI MYCHEAL VELDHUIZEN reports that he has been out of his blood pressure medications for the past week.  Reports that his primary care provider is no longer available and he has had difficulty getting his medications refilled.  Reports that he does not check his blood pressure at home.  Denies any hypertensive symptoms.  Does endorse significant history of heart attack in 2014.  Denies any episodes of chest pain since then.  Reports that he has not used his albuterol inhaler in over 2 years.  Reports that he no longer works in the [employer] and thinks that this has improved his breathing.  No other concerns at this time  Past Medical History:  Diagnosis Date   Anxiety    Aortic insufficiency    moderate to severe by echo 02/2020   Coronary artery disease 03/24/2014   NSTEMI s/p PCI of mid LAD and repeat cath with patent stent   Depression    Hypertension    Tobacco abuse     Past Surgical History:  Procedure Laterality Date   CARDIAC CATHETERIZATION  03/04/2014   PCI of mid LAD   CARDIAC CATHETERIZATION N/A 11/12/2015   Procedure: Left Heart Cath and Coronary Angiography;  Surgeon: Tonny Bollman, MD;  Location: [facility] INVASIVE CV LAB;  Service: Cardiovascular;  Laterality: N/A;   LEFT HEART CATHETERIZATION WITH CORONARY ANGIOGRAM Bilateral 03/04/2014   Procedure: LEFT HEART CATHETERIZATION WITH CORONARY ANGIOGRAM;  Surgeon: Kathleene Hazel, MD;  Location: [facility] CATH LAB;  Service: Cardiovascular;  Laterality: Bilateral;   LEFT HEART CATHETERIZATION WITH CORONARY ANGIOGRAM N/A 03/26/2014   Procedure: LEFT HEART CATHETERIZATION WITH CORONARY ANGIOGRAM;  Surgeon: Lennette Bihari, MD;  Location: [facility] CATH LAB;  Service: Cardiovascular;  Laterality: N/A;    Family History   Problem Relation Age of Onset   Hypertension Mother    Hypertension Father    Alcohol abuse Father    Hypertension Other    Diabetes Other    Heart attack Other     Social History   Socioeconomic History   Marital status: Legally Separated    Spouse name: Not on file   Number of children: Not on file   Years of education: Not on file   Highest education level: Not on file  Occupational History   Not on file  Tobacco Use   Smoking status: Former    Packs/day: 0.50    Years: 30.00    Pack years: 15.00    Types: Cigarettes    Quit date: 12/29/2015    Years since quitting: 4.6   Smokeless tobacco: Never  Vaping Use   Vaping Use: Never used  Substance and Sexual Activity   Alcohol use: No    Alcohol/week: 0.0 standard drinks   Drug use: Yes    Types: Marijuana    Comment: daily use   Sexual activity: Not Currently    Birth control/protection: Condom  Other Topics Concern   Not on file  Social History Narrative   Not on file   Social Determinants of Health   Financial Resource Strain: Not on file  Food Insecurity: Not on file  Transportation Needs: Not on file  Physical Activity: Not on file  Stress: Not on file  Social Connections: Not on file  Intimate Partner Violence: Not on file    Outpatient Medications Prior to Visit  Medication Sig Dispense Refill   albuterol (VENTOLIN HFA) 108 (90 Base) MCG/ACT inhaler Inhale 2 puffs into the lungs every 6 (six) hours as needed for wheezing or shortness of breath. 18 g 1   aspirin EC 81 MG tablet Take 81 mg by mouth.     nitroGLYCERIN (NITROSTAT) 0.4 MG SL tablet Place 1 tablet (0.4 mg total) under the tongue every 5 (five) minutes as needed for chest pain. 30 tablet 0   amLODipine (NORVASC) 2.5 MG tablet Take 2 tablets by mouth once daily 30 tablet 0   hydrochlorothiazide (HYDRODIURIL) 25 MG tablet Take 1 tablet (25 mg total) by mouth daily. 90 tablet 1   methocarbamol (ROBAXIN) 500 MG tablet Take 1 tablet (500 mg  total) by mouth 2 (two) times daily as needed for muscle spasms. 20 tablet 0   mirtazapine (REMERON) 15 MG tablet Take 1 tablet (15 mg total) by mouth at bedtime. 90 tablet 3   sertraline (ZOLOFT) 100 MG tablet Take 2 tablets (200 mg total) by mouth daily. 180 tablet 3   No facility-administered medications prior to visit.    Allergies  Allergen Reactions   Hydrocodone Hives    ROS Review of Systems  Constitutional: Negative.   HENT: Negative.    Eyes: Negative.   Respiratory:  Negative for shortness of breath.   Cardiovascular:  Negative for chest pain.  Gastrointestinal: Negative.   Endocrine: Negative.   Genitourinary: Negative.   Musculoskeletal: Negative.   Skin: Negative.   Allergic/Immunologic: Negative.   Neurological: Negative.   Hematological: Negative.   Psychiatric/Behavioral: Negative.       Objective:    Physical Exam Vitals and nursing note reviewed.  Constitutional:      Appearance: Normal appearance.  HENT:     Head: Normocephalic and atraumatic.     Right Ear: External ear normal.     Left Ear: External ear normal.     Nose: Nose normal.     Mouth/Throat:     Mouth: Mucous membranes are moist.     Pharynx: Oropharynx is clear.  Eyes:     General: Scleral icterus present.     Extraocular Movements: Extraocular movements intact.     Pupils: Pupils are equal, round, and reactive to light.  Cardiovascular:     Rate and Rhythm: Normal rate and regular rhythm.     Pulses: Normal pulses.     Heart sounds: Normal heart sounds.  Pulmonary:     Effort: Pulmonary effort is normal.     Breath sounds: Normal breath sounds.  Musculoskeletal:        General: Normal range of motion.     Cervical back: Normal range of motion and neck supple.  Skin:    General: Skin is warm and dry.  Neurological:     General: No focal deficit present.     Mental Status: He is alert and oriented to person, place, and time.  Psychiatric:        Mood and Affect: Mood  normal.        Behavior: Behavior normal.        Thought Content: Thought content normal.        Judgment: Judgment normal.    BP (!) 148/84 (BP Location: Left Arm, Patient Position: Sitting, Cuff Size: Normal)   Pulse 70   Temp 98.2 F (36.8 C) (Temporal)  Resp 18   Ht 5\' 7"  (1.702 m)   Wt 162 lb (73.5 kg)   SpO2 98%   BMI 25.37 kg/m  Wt Readings from Last 3 Encounters:  08/31/20 162 lb (73.5 kg)  08/10/20 165 lb (74.8 kg)  03/25/20 165 lb (74.8 kg)     Health Maintenance Due  Topic Date Due   Pneumococcal Vaccine 20-1 Years old (1 - PCV) Never done   COLONOSCOPY (Pts 45-65yrs Insurance coverage will need to be confirmed)  Never done   Zoster Vaccines- Shingrix (1 of 2) Never done   COVID-19 Vaccine (3 - Booster for Moderna series) 03/22/2020    There are no preventive care reminders to display for this patient.  Lab Results  Component Value Date   TSH 0.436 (L) 12/25/2018   Lab Results  Component Value Date   WBC 5.6 09/01/2017   HGB 14.3 09/01/2017   HCT 45.2 09/01/2017   MCV 90 09/01/2017   PLT 275 09/01/2017   Lab Results  Component Value Date   NA 140 10/30/2019   K 4.7 10/30/2019   CO2 26 10/30/2019   GLUCOSE 94 10/30/2019   BUN 14 10/30/2019   CREATININE 1.11 10/30/2019   BILITOT 0.4 10/30/2019   ALKPHOS 84 10/30/2019   AST 20 10/30/2019   ALT 17 10/30/2019   PROT 8.4 10/30/2019   ALBUMIN 5.0 10/30/2019   CALCIUM 10.3 (H) 10/30/2019   ANIONGAP 6 11/11/2015   GFR 90.25 10/22/2014   Lab Results  Component Value Date   CHOL 306 (H) 10/30/2019   Lab Results  Component Value Date   HDL 69 10/30/2019   Lab Results  Component Value Date   LDLCALC 202 (H) 10/30/2019   Lab Results  Component Value Date   TRIG 188 (H) 10/30/2019   Lab Results  Component Value Date   CHOLHDL 4.4 10/30/2019   Lab Results  Component Value Date   HGBA1C 5.9 (H) 12/25/2018      Assessment & Plan:   Problem List Items Addressed This Visit        Cardiovascular and Mediastinum   Essential hypertension - Primary   Relevant Medications   amLODipine (NORVASC) 5 MG tablet   hydrochlorothiazide (HYDRODIURIL) 25 MG tablet   Other Relevant Orders   CBC with Differential/Platelet   Comp. Metabolic Panel (12)   TSH   Coronary artery disease due to lipid rich plaque   Relevant Medications   amLODipine (NORVASC) 5 MG tablet   hydrochlorothiazide (HYDRODIURIL) 25 MG tablet     Other   Dyslipidemia   Relevant Orders   Lipid panel   Major depressive disorder, recurrent episode, moderate (HCC)   Relevant Medications   mirtazapine (REMERON) 15 MG tablet   sertraline (ZOLOFT) 100 MG tablet   Other Visit Diagnoses     Screening PSA (prostate specific antigen)       Relevant Orders   PSA   History of MI (myocardial infarction)           Meds ordered this encounter  Medications   amLODipine (NORVASC) 5 MG tablet    Sig: Take 1 tablet (5 mg total) by mouth daily.    Dispense:  90 tablet    Refill:  0    Order Specific Question:   Supervising Provider    Answer:   12/27/2018 [[id number]]   hydrochlorothiazide (HYDRODIURIL) 25 MG tablet    Sig: Take 1 tablet (25 mg total) by mouth daily.    Dispense:  90 tablet    Refill:  1    Order Specific Question:   Supervising Provider    Answer:   Shan Levans E [[id number]]   mirtazapine (REMERON) 15 MG tablet    Sig: Take 1 tablet (15 mg total) by mouth at bedtime.    Dispense:  90 tablet    Refill:  3    Order Specific Question:   Supervising Provider    Answer:   Shan Levans E [[id number]]   sertraline (ZOLOFT) 100 MG tablet    Sig: Take 2 tablets (200 mg total) by mouth daily.    Dispense:  180 tablet    Refill:  3    Order Specific Question:   Supervising Provider    Answer:   Delford Field, PATRICK E [[id number]]  1. Essential hypertension Resume regimen.  Patient encouraged to check blood pressure at home, keep a written log and have available for all office visits.  Patient was given  application for [facility] health financial assistance.  Patient states ability to pay for medications at [facility].  Patient to return to clinic tomorrow morning for fasting labs.  Patient has an appointment to establish care at [facility] at [facility].  Patient encouraged to check blood pressure at home, keep a written log and have available for all office visits.  Red flags given for prompt reevaluation - amLODipine (NORVASC) 5 MG tablet; Take 1 tablet (5 mg total) by mouth daily.  Dispense: 90 tablet; Refill: 0 - hydrochlorothiazide (HYDRODIURIL) 25 MG tablet; Take 1 tablet (25 mg total) by mouth daily.  Dispense: 90 tablet; Refill: 1 - CBC with Differential/Platelet; Future - Comp. Metabolic Panel (12); Future - TSH; Future  2. Major depressive disorder, recurrent episode, moderate (HCC) Continue current regimen - mirtazapine (REMERON) 15 MG tablet; Take 1 tablet (15 mg total) by mouth at bedtime.  Dispense: 90 tablet; Refill: 3 - sertraline (ZOLOFT) 100 MG tablet; Take 2 tablets (200 mg total) by mouth daily.  Dispense: 180 tablet; Refill: 3  3. Dyslipidemia Return for fasting labs - Lipid panel; Future  4. Coronary artery disease due to lipid rich plaque   5. Screening PSA (prostate specific antigen)  - PSA; Future  6. History of MI (myocardial infarction)    I have reviewed the patient's medical history (PMH, PSH, Social History, Family History, Medications, and allergies) , and have been updated if relevant. I spent 30 minutes reviewing chart and  face to face time with patient.     Follow-up: Return if symptoms worsen or fail to improve.    Kasandra Knudsen Mayers, PA-C

## 2020-08-31 NOTE — Patient Instructions (Signed)
I have changed your amlodipine to a 5 mg tablet instead of two 2.5 mg tablets.  I encourage you to check your blood pressure at home several times a week, keep a written log and have available for all office visits.  Please return in the morning to have fasting labs completed.  We will call you once those lab results are available.  Please let us know if there is anything else we can do for you.  Roney Jaffe, PA-C Physician Assistant [facility] [email or web address]   How to Take Your Blood Pressure Blood pressure is a measurement of how strongly your blood is pressing against the walls of your arteries. Arteries are blood vessels that carry blood from your heart throughout your body. Your health care provider takes your blood pressure at each office visit. You can also take your own blood pressure athome with a blood pressure monitor. You may need to take your own blood pressure to: Confirm a diagnosis of high blood pressure (hypertension). Monitor your blood pressure over time. Make sure your blood pressure medicine is working. Supplies needed: Blood pressure monitor. Dining room chair to sit in. Table or desk. Small notebook and pencil or pen. How to prepare To get the most accurate reading, avoid the following for 30 minutes before you check your blood pressure: Drinking caffeine. Drinking alcohol. Eating. Smoking. Exercising. Five minutes before you check your blood pressure: Use the bathroom and urinate so that you have an empty bladder. Sit quietly in a dining room chair. Do not sit in a soft couch or an armchair. Do not talk. How to take your blood pressure To check your blood pressure, follow the instructions in the manual that came with your blood pressure monitor. If you have a digital blood pressure monitor, the instructions may be as follows: Sit up straight in a chair. Place your feet on the floor. Do not cross  your ankles or legs. Rest your left arm at the level of your heart on a table or desk or on the arm of a chair. Pull up your shirt sleeve. Wrap the blood pressure cuff around the upper part of your left arm, 1 inch (2.5 cm) above your elbow. It is best to wrap the cuff around bare skin. Fit the cuff snugly around your arm. You should be able to place only one finger between the cuff and your arm. Position the cord so that it rests in the bend of your elbow. Press the power button. Sit quietly while the cuff inflates and deflates. Read the digital reading on the monitor screen and write the numbers down (record them) in a notebook. Wait 2-3 minutes, then repeat the steps, starting at step 1. What does my blood pressure reading mean? A blood pressure reading consists of a higher number over a lower number. Ideally, your blood pressure should be below 120/80. The first ("top") number is called the systolic pressure. It is a measure of the pressure in your arteries as your heart beats. The second ("bottom") number is called the diastolic pressure. It is a measure of the pressure in your arteries as theheart relaxes. Blood pressure is classified into five stages. The following are the stages for adults who do not have a short-term serious illness or a chronic condition. Systolic pressure and diastolic pressure are measured in a unit called mm Hg (millimeters of mercury). Normal Systolic pressure: below 120. Diastolic pressure: below 80. Elevated Systolic pressure: 120-129. Diastolic pressure: below 80. Hypertension  stage 1 Systolic pressure: 130-139. Diastolic pressure: 80-89. Hypertension stage 2 Systolic pressure: 140 or above. Diastolic pressure: 90 or above. You can have elevated blood pressure or hypertension even if only the systolicor only the diastolic number in your reading is higher than normal. Follow these instructions at home: Medicines Take over-the-counter and prescription  medicines only as told by your health care provider. Tell your health care provider if you are having any side effects from blood pressure medicine. General instructions Check your blood pressure as often as recommended by your health care provider. Check your blood pressure at the same time every day. Take your monitor to the next appointment with your health care provider to make sure that: You are using it correctly. It provides accurate readings. Understand what your goal blood pressure numbers are. Keep all follow-up visits as told by your health care provider. This is important. General tips Your health care provider can suggest a reliable monitor that will meet your needs. There are several types of home blood pressure monitors. Choose a monitor that has an arm cuff. Do not choose a monitor that measures your blood pressure from your wrist or finger. Choose a cuff that wraps snugly around your upper arm. You should be able to fit only one finger between your arm and the cuff. You can buy a blood pressure monitor at most drugstores or online. Where to find more information American Heart Association: www.heart.org Contact a health care provider if: Your blood pressure is consistently high. Your blood pressure is suddenly low. Get help right away if: Your systolic blood pressure is higher than 180. Your diastolic blood pressure is higher than 120. Summary Blood pressure is a measurement of how strongly your blood is pressing against the walls of your arteries. A blood pressure reading consists of a higher number over a lower number. Ideally, your blood pressure should be below 120/80. Check your blood pressure at the same time every day. Avoid caffeine, alcohol, smoking, and exercise for 30 minutes prior to checking your blood pressure. These agents can affect the accuracy of the blood pressure reading. This information is not intended to replace advice given to you by your health  care provider. Make sure you discuss any questions you have with your healthcare provider. Document Revised: 12/24/2019 Document Reviewed: 02/07/2019 Elsevier Patient Education  2022 [employer].

## 2020-09-01 ENCOUNTER — Other Ambulatory Visit: Payer: Self-pay | Admitting: Physician Assistant

## 2020-09-01 NOTE — Progress Notes (Unsigned)
Patient forgot to fast for lab visit today.

## 2020-09-03 ENCOUNTER — Other Ambulatory Visit: Payer: Self-pay

## 2020-09-21 ENCOUNTER — Telehealth (HOSPITAL_COMMUNITY): Payer: Self-pay | Admitting: Cardiology

## 2020-09-21 NOTE — Telephone Encounter (Signed)
Just an FYI. We have made several attempts to contact this patient including sending a letter to schedule or reschedule their echocardiogram. We will be removing the patient from the echo WQ.  09/20/20 [facility] to schedule @ 1:53/LBW  09/06/20 [facility] to schedule for August @ 10:39/LBW  09/01/20 [facility] to schedule for August @ 11:14/LBW  08/18/20 MAILED LETTER [facility]  08/18/20 [facility] to schedule @ 11:11/LBW  08/13/20 [facility] to schedule @ 9:04/LBW  08/11/20 [facility] to schedule @ 9:11/LBW         Thank you

## 2020-11-22 NOTE — Progress Notes (Signed)
Erroneous encounter

## 2020-11-26 ENCOUNTER — Encounter: Payer: Self-pay | Admitting: Family

## 2020-11-26 DIAGNOSIS — Z7689 Persons encountering health services in other specified circumstances: Secondary | ICD-10-CM

## 2020-11-26 DIAGNOSIS — I10 Essential (primary) hypertension: Secondary | ICD-10-CM

## 2021-02-01 ENCOUNTER — Ambulatory Visit: Payer: Self-pay | Admitting: Family Medicine

## 2021-05-01 ENCOUNTER — Other Ambulatory Visit: Payer: Self-pay | Admitting: Physician Assistant

## 2021-05-01 DIAGNOSIS — I10 Essential (primary) hypertension: Secondary | ICD-10-CM

## 2021-06-02 NOTE — Progress Notes (Signed)
?Subjective:  ? ? Darren [name] - 51 y.o. male MRN [id number]  Date of birth: [birth year] ? ?HPI ? ?FED CECI is to establish care.  ? ?Current issues and/or concerns: ?HYPERTENSION: ?Currently taking: see medication list ?Have you taken your blood pressure medication today: []  Yes [x]  No  ?Med Adherence: [x]  Yes    []  No ?Medication side effects: []  Yes    [x]  No ?Adherence with salt restriction (low-salt diet): [x]  Yes  []  No ?Exercise: Yes []  No [x]  ?Home Monitoring?: [x]  Yes    []  No ?Home BP results range: 130's/80's ?SOB? []  Yes    [x]  No ?Chest Pain?: []  Yes    [x]  No ?Comments: Reports taking Hydrochlorothiazide and Amlodipine. Has been without refills for at least 4 weeks.  ? ? ?ROS per HPI  ? ? ?Health Maintenance: ?Health Maintenance Due  ?Topic Date Due  ? Hepatitis C Screening  Never done  ? COLONOSCOPY (Pts 45-96yrs Insurance coverage will need to be confirmed)  Never done  ? ? ? ?Past Medical History: ?Patient Active Problem List  ? Diagnosis Date Noted  ? Alcohol dependence in remission (HCC) 09/01/2017  ? Major depressive disorder, recurrent episode, moderate (HCC) 09/01/2017  ? Dyslipidemia 02/01/2017  ? Coronary artery disease involving native heart 02/01/2017  ? Chest pain in adult 02/01/2017  ? Hypertension 02/01/2017  ? Pulmonary emphysema (HCC) 03/24/2016  ? Angina pectoris (HCC) 11/11/2015  ? Aortic insufficiency   ? MDD (major depressive disorder) 08/06/2014  ? Depression   ? Coronary artery disease due to lipid rich plaque   ? Essential hypertension 03/04/2014  ? ? ?Social History  ? reports that he quit smoking about 5 years ago. His smoking use included cigarettes. He has a 15.00 pack-year smoking history. He has been exposed to tobacco smoke. He has never used smokeless tobacco. He reports current alcohol use of about 2.0 standard drinks per week. He reports current drug use. Drug: Marijuana.  ? ?Family History  ?family history includes Alcohol abuse in his father; Diabetes in an  other family member; Heart attack in an other family member; Hypertension in his father, mother, and another family member.  ? ?Medications: reviewed and updated ?  ?Objective:  ? Physical Exam ?BP (!) 151/74 (BP Location: Left Arm, Patient Position: Sitting, Cuff Size: Normal)   Pulse 90   Temp 98.3 ?F (36.8 ?C)   Resp 18   Ht 5' 6.14" (1.68 m)   Wt 160 lb (72.6 kg)   SpO2 98%   BMI 25.71 kg/m?  ? ?Physical Exam ?HENT:  ?   Head: Normocephalic and atraumatic.  ?Eyes:  ?   Extraocular Movements: Extraocular movements intact.  ?   Conjunctiva/sclera: Conjunctivae normal.  ?   Pupils: Pupils are equal, round, and reactive to light.  ?Cardiovascular:  ?   Rate and Rhythm: Normal rate and regular rhythm.  ?   Pulses: Normal pulses.  ?   Heart sounds: Normal heart sounds.  ?Pulmonary:  ?   Effort: Pulmonary effort is normal.  ?   Breath sounds: Normal breath sounds.  ?Musculoskeletal:  ?   Cervical back: Normal range of motion and neck supple.  ?Neurological:  ?   General: No focal deficit present.  ?   Mental Status: He is alert and oriented to person, place, and time.  ?Psychiatric:     ?   Mood and Affect: Mood normal.     ?   Behavior: Behavior normal.  ? ?   ?  Assessment & Plan:  ?1. Encounter to establish care: ?- Patient presents today to establish care.  ?- Return for annual physical examination, labs, and health maintenance. Arrive fasting meaning having no food for at least 8 hours prior to appointment. You may have only water or black coffee. Please take scheduled medications as normal. ? ?2. Essential (primary) hypertension: ?- Blood pressure not at goal during today's visit. Patient asymptomatic without chest pressure, chest pain, palpitations, shortness of breath, worst headache of life, and any additional red flag symptoms. ?- Resume Hydrochlorothiazide and Amlodipine as prescribed.  ?- Counseled on blood pressure goal of less than 130/80, low-sodium, DASH diet, medication compliance, and 150 minutes  of moderate intensity exercise per week as tolerated. Counseled on medication adherence and adverse effects. ?- BMP to evaluate kidney function and electrolyte balance. ?- Follow-up with primary provider in 2 weeks or sooner if needed for blood pressure check. ?- Basic Metabolic Panel ?- hydrochlorothiazide (HYDRODIURIL) 25 MG tablet; Take 1 tablet (25 mg total) by mouth daily.  Dispense: 90 tablet; Refill: 0 ?- amLODipine (NORVASC) 5 MG tablet; Take 1 tablet (5 mg total) by mouth daily.  Dispense: 90 tablet; Refill: 0 ? ? ? ? ?Patient was given clear instructions to go to Emergency Department or return to medical center if symptoms don't improve, worsen, or new problems develop.The patient verbalized understanding. ? ?I discussed the assessment and treatment plan with the patient. The patient was provided an opportunity to ask questions and all were answered. The patient agreed with the plan and demonstrated an understanding of the instructions. ?  ?The patient was advised to call back or seek an in-person evaluation if the symptoms worsen or if the condition fails to improve as anticipated. ? ? ? ?Ricky Stabs, NP ?06/07/2021, 8:47 AM ?Primary Care at [facility]  ? ?

## 2021-06-07 ENCOUNTER — Encounter: Payer: Self-pay | Admitting: Family

## 2021-06-07 ENCOUNTER — Ambulatory Visit (INDEPENDENT_AMBULATORY_CARE_PROVIDER_SITE_OTHER): Payer: Self-pay | Admitting: Family

## 2021-06-07 VITALS — BP 151/74 | HR 90 | Temp 98.3°F | Resp 18 | Ht 66.14 in | Wt 160.0 lb

## 2021-06-07 DIAGNOSIS — I10 Essential (primary) hypertension: Secondary | ICD-10-CM

## 2021-06-07 DIAGNOSIS — Z7689 Persons encountering health services in other specified circumstances: Secondary | ICD-10-CM

## 2021-06-07 MED ORDER — AMLODIPINE BESYLATE 5 MG PO TABS: 5.0000 mg | ORAL_TABLET | Freq: Every day | ORAL | 0 refills | Status: DC

## 2021-06-07 MED ORDER — HYDROCHLOROTHIAZIDE 25 MG PO TABS: 25.0000 mg | ORAL_TABLET | Freq: Every day | ORAL | 0 refills | Status: DC

## 2021-06-07 NOTE — Patient Instructions (Signed)
Thank you for choosing Primary Care at [facility] for your medical home!   ? ?Darren [name] was seen by Rema Fendt, NP today.  ? ?Bing Neighbors Torregrossa's primary care provider is Rema Fendt, NP.   ?For the best care possible,  you should try to see Ricky Stabs, NP ?whenever you come to office.  ? ?We look forward to seeing you again soon! ? ?If you have any questions about your visit today,  ?please call us at [phone] ? ?Or feel free to reach your provider via MyChart.   ? ?Hypertension, Adult ?High blood pressure (hypertension) is when the force of blood pumping through the arteries is too strong. The arteries are the blood vessels that carry blood from the heart throughout the body. Hypertension forces the heart to work harder to pump blood and may cause arteries to become narrow or stiff. Untreated or uncontrolled hypertension can cause a heart attack, heart failure, a stroke, kidney disease, and other problems. ?A blood pressure reading consists of a higher number over a lower number. Ideally, your blood pressure should be below 120/80. The first ("top") number is called the systolic pressure. It is a measure of the pressure in your arteries as your heart beats. The second ("bottom") number is called the diastolic pressure. It is a measure of the pressure in your arteries as the heart relaxes. ?What are the causes? ?The exact cause of this condition is not known. There are some conditions that result in or are related to high blood pressure. ?What increases the risk? ?Some risk factors for high blood pressure are under your control. The following factors may make you more likely to develop this condition: ?Smoking. ?Having type 2 diabetes mellitus, high cholesterol, or both. ?Not getting enough exercise or physical activity. ?Being overweight. ?Having too much fat, sugar, calories, or salt (sodium) in your diet. ?Drinking too much alcohol. ?Some risk factors for high blood pressure may be  difficult or impossible to change. Some of these factors include: ?Having chronic kidney disease. ?Having a family history of high blood pressure. ?Age. Risk increases with age. ?Race. You may be at higher risk if you are African American. ?Gender. Men are at higher risk than women before age 41. After age 95, women are at higher risk than men. ?Having obstructive sleep apnea. ?Stress. ?What are the signs or symptoms? ?High blood pressure may not cause symptoms. Very high blood pressure (hypertensive crisis) may cause: ?Headache. ?Anxiety. ?Shortness of breath. ?Nosebleed. ?Nausea and vomiting. ?Vision changes. ?Severe chest pain. ?Seizures. ?How is this diagnosed? ?This condition is diagnosed by measuring your blood pressure while you are seated, with your arm resting on a flat surface, your legs uncrossed, and your feet flat on the floor. The cuff of the blood pressure monitor will be placed directly against the skin of your upper arm at the level of your heart. It should be measured at least twice using the same arm. Certain conditions can cause a difference in blood pressure between your right and left arms. ?Certain factors can cause blood pressure readings to be lower or higher than normal for a short period of time: ?When your blood pressure is higher when you are in a health care provider's office than when you are at home, this is called white coat hypertension. Most people with this condition do not need medicines. ?When your blood pressure is higher at home than when you are in a health care provider's office, this is called  masked hypertension. Most people with this condition may need medicines to control blood pressure. ?If you have a high blood pressure reading during one visit or you have normal blood pressure with other risk factors, you may be asked to: ?Return on a different day to have your blood pressure checked again. ?Monitor your blood pressure at home for 1 week or longer. ?If you are  diagnosed with hypertension, you may have other blood or imaging tests to help your health care provider understand your overall risk for other conditions. ?How is this treated? ?This condition is treated by making healthy lifestyle changes, such as eating healthy foods, exercising more, and reducing your alcohol intake. Your health care provider may prescribe medicine if lifestyle changes are not enough to get your blood pressure under control, and if: ?Your systolic blood pressure is above 130. ?Your diastolic blood pressure is above 80. ?Your personal target blood pressure may vary depending on your medical conditions, your age, and other factors. ?Follow these instructions at home: ?Eating and drinking ? ?Eat a diet that is high in fiber and potassium, and low in sodium, added sugar, and fat. An example eating plan is called the DASH (Dietary Approaches to Stop Hypertension) diet. To eat this way: ?Eat plenty of fresh fruits and vegetables. Try to fill one half of your plate at each meal with fruits and vegetables. ?Eat whole grains, such as whole-wheat pasta, brown rice, or whole-grain bread. Fill about one fourth of your plate with whole grains. ?Eat or drink low-fat dairy products, such as skim milk or low-fat yogurt. ?Avoid fatty cuts of meat, processed or cured meats, and poultry with skin. Fill about one fourth of your plate with lean proteins, such as fish, chicken without skin, beans, eggs, or tofu. ?Avoid pre-made and processed foods. These tend to be higher in sodium, added sugar, and fat. ?Reduce your daily sodium intake. Most people with hypertension should eat less than 1,500 mg of sodium a day. ?Do not drink alcohol if: ?Your health care provider tells you not to drink. ?You are pregnant, may be pregnant, or are planning to become pregnant. ?If you drink alcohol: ?Limit how much you use to: ?0-1 drink a day for women. ?0-2 drinks a day for men. ?Be aware of how much alcohol is in your drink. In the  [place] one drink equals one 12 oz bottle of beer (355 mL), one 5 oz glass of wine (148 mL), or one 1? oz glass of hard liquor (44 mL). ?Lifestyle ? ?Work with your health care provider to maintain a healthy body weight or to lose weight. Ask what an ideal weight is for you. ?Get at least 30 minutes of exercise most days of the week. Activities may include walking, swimming, or biking. ?Include exercise to strengthen your muscles (resistance exercise), such as Pilates or lifting weights, as part of your weekly exercise routine. Try to do these types of exercises for 30 minutes at least 3 days a week. ?Do not use any products that contain nicotine or tobacco, such as cigarettes, e-cigarettes, and chewing tobacco. If you need help quitting, ask your health care provider. ?Monitor your blood pressure at home as told by your health care provider. ?Keep all follow-up visits as told by your health care provider. This is important. ?Medicines ?Take over-the-counter and prescription medicines only as told by your health care provider. Follow directions carefully. Blood pressure medicines must be taken as prescribed. ?Do not skip doses of blood pressure medicine. Doing  this puts you at risk for problems and can make the medicine less effective. ?Ask your health care provider about side effects or reactions to medicines that you should watch for. ?Contact a health care provider if you: ?Think you are having a reaction to a medicine you are taking. ?Have headaches that keep coming back (recurring). ?Feel dizzy. ?Have swelling in your ankles. ?Have trouble with your vision. ?Get help right away if you: ?Develop a severe headache or confusion. ?Have unusual weakness or numbness. ?Feel faint. ?Have severe pain in your chest or abdomen. ?Vomit repeatedly. ?Have trouble breathing. ?Summary ?Hypertension is when the force of blood pumping through your arteries is too strong. If this condition is not controlled, it may put you at risk  for serious complications. ?Your personal target blood pressure may vary depending on your medical conditions, your age, and other factors. For most people, a normal blood pressure is less than 120/80. ?Hypertensio

## 2021-06-07 NOTE — Progress Notes (Signed)
Pt presents to establish care and hypertension follow-up, pt states has been out of BP meds for approx 1 month or more   ?

## 2021-06-08 LAB — BASIC METABOLIC PANEL
BUN/Creatinine Ratio: 13 (ref 9–20)
BUN: 14 mg/dL (ref 6–24)
CO2: 22 mmol/L (ref 20–29)
Calcium: 9.4 mg/dL (ref 8.7–10.2)
Chloride: 100 mmol/L (ref 96–106)
Creatinine, Ser: 1.07 mg/dL (ref 0.76–1.27)
Glucose: 85 mg/dL (ref 70–99)
Potassium: 4.8 mmol/L (ref 3.5–5.2)
Sodium: 136 mmol/L (ref 134–144)
eGFR: 84 mL/min/{1.73_m2} (ref >59)

## 2021-06-08 NOTE — Progress Notes (Signed)
Kidney function and electrolytes normal.

## 2021-06-17 NOTE — Progress Notes (Signed)
? ? ?Patient ID: Darren [name], male    DOB: 1971/02/02  MRN: [id number] ? ?CC: Annual Physical Exam ? ?Subjective: ?Darren [name] is a 50 y.o. male who presents  annual physical exam.  ? ?His concerns today include:  ?Hypertension follow-up: ?06/07/2021: ?- Resume Hydrochlorothiazide and Amlodipine as prescribed.  ? ?06/24/2021: ?Doing well on current regimen. No side effects. No issues/concerns. Denies chest pain, shortness of breath, worst headache of life and additional red flag symptoms. Not recently checking blood pressures at home.  ? ?2. Muscle spasms: ?Reports around 3 years ago injured bilateral lower back while lifting heavy water. Causing muscle spasms intermittently. Taking Ibuprofen helps some. No additional symptoms.  ? ?Patient Active Problem List  ? Diagnosis Date Noted  ? Alcohol dependence in remission (HCC) 09/01/2017  ? Major depressive disorder, recurrent episode, moderate (HCC) 09/01/2017  ? Dyslipidemia 02/01/2017  ? Coronary artery disease involving native heart 02/01/2017  ? Chest pain in adult 02/01/2017  ? Hypertension 02/01/2017  ? Pulmonary emphysema (HCC) 03/24/2016  ? Angina pectoris (HCC) 11/11/2015  ? Aortic insufficiency   ? MDD (major depressive disorder) 08/06/2014  ? Depression   ? Coronary artery disease due to lipid rich plaque   ? Essential hypertension 03/04/2014  ?  ? ?Current Outpatient Medications on File Prior to Visit  ?Medication Sig Dispense Refill  ? albuterol (VENTOLIN HFA) 108 (90 Base) MCG/ACT inhaler Inhale 2 puffs into the lungs every 6 (six) hours as needed for wheezing or shortness of breath. 18 g 1  ? amLODipine (NORVASC) 5 MG tablet Take 1 tablet (5 mg total) by mouth daily. 90 tablet 0  ? aspirin EC 81 MG tablet Take 81 mg by mouth.    ? hydrochlorothiazide (HYDRODIURIL) 25 MG tablet Take 1 tablet (25 mg total) by mouth daily. 90 tablet 0  ? mirtazapine (REMERON) 15 MG tablet Take 1 tablet (15 mg total) by mouth at bedtime. 90 tablet 3  ? nitroGLYCERIN  (NITROSTAT) 0.4 MG SL tablet Place 1 tablet (0.4 mg total) under the tongue every 5 (five) minutes as needed for chest pain. 30 tablet 0  ? sertraline (ZOLOFT) 100 MG tablet Take 2 tablets (200 mg total) by mouth daily. 180 tablet 3  ? ?No current facility-administered medications on file prior to visit.  ? ? ?Allergies  ?Allergen Reactions  ? Hydrocodone Hives  ? ? ?Social History  ? ?Socioeconomic History  ? Marital status: Legally Separated  ?  Spouse name: Not on file  ? Number of children: Not on file  ? Years of education: Not on file  ? Highest education level: Not on file  ?Occupational History  ? Not on file  ?Tobacco Use  ? Smoking status: Former  ?  Packs/day: 0.50  ?  Years: 30.00  ?  Pack years: 15.00  ?  Types: Cigarettes  ?  Quit date: 12/29/2015  ?  Years since quitting: 5.4  ?  Passive exposure: Past  ? Smokeless tobacco: Never  ?Vaping Use  ? Vaping Use: Former  ?Substance and Sexual Activity  ? Alcohol use: Yes  ?  Alcohol/week: 2.0 standard drinks  ?  Types: 2 Standard drinks or equivalent per week  ?  Comment: socially  ? Drug use: Yes  ?  Types: Marijuana  ?  Comment: daily use  ? Sexual activity: Not Currently  ?  Birth control/protection: Condom  ?Other Topics Concern  ? Not on file  ?Social History Narrative  ? Not on file  ? ?  Social Determinants of Health  ? ?Financial Resource Strain: Not on file  ?Food Insecurity: Not on file  ?Transportation Needs: Not on file  ?Physical Activity: Not on file  ?Stress: Not on file  ?Social Connections: Not on file  ?Intimate Partner Violence: Not on file  ? ? ?Family History  ?Problem Relation Age of Onset  ? Hypertension Mother   ? Hypertension Father   ? Alcohol abuse Father   ? Hypertension Other   ? Diabetes Other   ? Heart attack Other   ? ? ?Past Surgical History:  ?Procedure Laterality Date  ? CARDIAC CATHETERIZATION  03/04/2014  ? PCI of mid LAD  ? CARDIAC CATHETERIZATION N/A 11/12/2015  ? Procedure: Left Heart Cath and Coronary Angiography;   Surgeon: Tonny BollmanMichael Cooper, MD;  [facility];  Service: Cardiovascular;  Laterality: N/A;  ? LEFT HEART CATHETERIZATION WITH CORONARY ANGIOGRAM Bilateral 03/04/2014  ? Procedure: LEFT HEART CATHETERIZATION WITH CORONARY ANGIOGRAM;  Surgeon: Kathleene Hazelhristopher D McAlhany, MD;  [facility];  Service: Cardiovascular;  Laterality: Bilateral;  ? LEFT HEART CATHETERIZATION WITH CORONARY ANGIOGRAM N/A 03/26/2014  ? Procedure: LEFT HEART CATHETERIZATION WITH CORONARY ANGIOGRAM;  Surgeon: Lennette Biharihomas A Kelly, MD;  [facility];  Service: Cardiovascular;  Laterality: N/A;  ? ? ?ROS: ?Review of Systems ?Negative except as stated above ? ?PHYSICAL EXAM: ?BP (!) 156/72 (BP Location: Left Arm, Patient Position: Sitting, Cuff Size: Large)   Pulse 67   Temp 98.5 ?F (36.9 ?C)   Resp 18   Ht 5' 6.14" (1.68 m)   Wt 160 lb (72.6 kg)   SpO2 98%   BMI 25.71 kg/m?  ? ?Physical Exam ?HENT:  ?   Head: Normocephalic and atraumatic.  ?   Right Ear: Tympanic membrane, ear canal and external ear normal.  ?   Left Ear: Tympanic membrane, ear canal and external ear normal.  ?   Nose: Nose normal.  ?   Mouth/Throat:  ?   Mouth: Mucous membranes are moist.  ?   Pharynx: Oropharynx is clear.  ?Eyes:  ?   Extraocular Movements: Extraocular movements intact.  ?   Conjunctiva/sclera: Conjunctivae normal.  ?   Pupils: Pupils are equal, round, and reactive to light.  ?Cardiovascular:  ?   Rate and Rhythm: Normal rate and regular rhythm.  ?   Pulses: Normal pulses.  ?   Heart sounds: Normal heart sounds.  ?Pulmonary:  ?   Effort: Pulmonary effort is normal.  ?   Breath sounds: Normal breath sounds.  ?Abdominal:  ?   General: Bowel sounds are normal.  ?   Palpations: Abdomen is soft.  ?Genitourinary: ?   Comments: Patient declined.  ?Musculoskeletal:     ?   General: Normal range of motion.  ?   Cervical back: Normal range of motion and neck supple.  ?Skin: ?   General: Skin is warm and dry.  ?   Capillary Refill: Capillary refill  takes less than 2 seconds.  ?Neurological:  ?   General: No focal deficit present.  ?   Mental Status: He is alert and oriented to person, place, and time.  ?Psychiatric:     ?   Mood and Affect: Mood normal.     ?   Behavior: Behavior normal.  ? ?ASSESSMENT AND PLAN: ?1. Annual physical exam: ?- Counseled on 150 minutes of exercise per week as tolerated, healthy eating (including decreased daily intake of saturated fats, cholesterol, added sugars, sodium), STI prevention,  and routine healthcare maintenance. ? ?2. Screening for metabolic disorder: ?- Screening liver function. ?- Hepatic Function Panel ? ?3. Screening for deficiency anemia: ?- CBC to screen for anemia. ?- CBC ? ?4. Diabetes mellitus screening: ?- Hemoglobin A1c to screen for pre-diabetes/diabetes. ?- Hemoglobin A1c ? ?5. Screening cholesterol level: ?- Lipid panel to screen for high cholesterol.  ?- Lipid panel ? ?6. Thyroid disorder screen: ?- TSH to check thyroid function.  ?- TSH ? ?7. Need for hepatitis C screening test: ?- Hepatitis C antibody to screen for hepatitis C.  ?- Hepatitis C Antibody ? ?8. Colon cancer screening: ?- Referral to Gastroenterology for colon cancer screening by colonoscopy. ?- Ambulatory referral to Gastroenterology ? ?9. Essential (primary) hypertension: ?- Blood pressure not at goal during today's visit. Patient asymptomatic without chest pressure, chest pain, palpitations, shortness of breath, worst headache of life, and any additional red flag symptoms. ?- Continue Amlodipine as prescribed. No refills needed as of present. ?- Increase Hydrochlorothiazide from 25 mg daily to 50 mg daily. Instructed to take two 25 mg tablets, patient verbalized understanding. No refills needed as of present.  ?- Counseled on blood pressure goal of less than 130/80, low-sodium, DASH diet, medication compliance, and 150 minutes of moderate intensity exercise per week as tolerated. Counseled on medication adherence and adverse effects. ?-  Follow-up with primary provider in 2 weeks or sooner if needed for blood pressure check. ? ?10. Spasm of muscle of lower back: ?- Cyclobenzaprine as prescribed. Counseled on medication adherence and adverse effects. Ma

## 2021-06-24 ENCOUNTER — Ambulatory Visit (INDEPENDENT_AMBULATORY_CARE_PROVIDER_SITE_OTHER): Payer: Self-pay | Admitting: Family

## 2021-06-24 VITALS — BP 156/72 | HR 67 | Temp 98.5°F | Resp 18 | Ht 66.14 in | Wt 160.0 lb

## 2021-06-24 DIAGNOSIS — Z13228 Encounter for screening for other metabolic disorders: Secondary | ICD-10-CM

## 2021-06-24 DIAGNOSIS — M62830 Muscle spasm of back: Secondary | ICD-10-CM

## 2021-06-24 DIAGNOSIS — I10 Essential (primary) hypertension: Secondary | ICD-10-CM

## 2021-06-24 DIAGNOSIS — Z13220 Encounter for screening for lipoid disorders: Secondary | ICD-10-CM

## 2021-06-24 DIAGNOSIS — Z131 Encounter for screening for diabetes mellitus: Secondary | ICD-10-CM

## 2021-06-24 DIAGNOSIS — Z0000 Encounter for general adult medical examination without abnormal findings: Secondary | ICD-10-CM

## 2021-06-24 DIAGNOSIS — Z1159 Encounter for screening for other viral diseases: Secondary | ICD-10-CM

## 2021-06-24 DIAGNOSIS — Z1211 Encounter for screening for malignant neoplasm of colon: Secondary | ICD-10-CM

## 2021-06-24 DIAGNOSIS — Z1329 Encounter for screening for other suspected endocrine disorder: Secondary | ICD-10-CM

## 2021-06-24 DIAGNOSIS — Z130 Encounter for screening for diseases of the blood and blood-forming organs and certain disorders involving the immune mechanism: Secondary | ICD-10-CM

## 2021-06-24 MED ORDER — CYCLOBENZAPRINE HCL 5 MG PO TABS: 5.0000 mg | ORAL_TABLET | Freq: Three times a day (TID) | ORAL | 1 refills | Status: DC | PRN

## 2021-06-24 NOTE — Patient Instructions (Signed)

## 2021-06-24 NOTE — Progress Notes (Signed)
Pt presents for annual physical exam  ?Pt has complaints of back being sore for about 2 weeks  ? ?Pt not fasting will return on 06/27/21 to complete labs  ?

## 2021-06-27 ENCOUNTER — Other Ambulatory Visit: Payer: Self-pay

## 2021-06-27 DIAGNOSIS — Z130 Encounter for screening for diseases of the blood and blood-forming organs and certain disorders involving the immune mechanism: Secondary | ICD-10-CM

## 2021-06-27 DIAGNOSIS — Z1159 Encounter for screening for other viral diseases: Secondary | ICD-10-CM

## 2021-06-27 DIAGNOSIS — Z13228 Encounter for screening for other metabolic disorders: Secondary | ICD-10-CM

## 2021-06-27 DIAGNOSIS — Z1329 Encounter for screening for other suspected endocrine disorder: Secondary | ICD-10-CM

## 2021-06-27 DIAGNOSIS — Z13220 Encounter for screening for lipoid disorders: Secondary | ICD-10-CM

## 2021-06-27 DIAGNOSIS — Z131 Encounter for screening for diabetes mellitus: Secondary | ICD-10-CM

## 2021-06-28 ENCOUNTER — Other Ambulatory Visit: Payer: Self-pay | Admitting: Family

## 2021-06-28 DIAGNOSIS — R7303 Prediabetes: Secondary | ICD-10-CM

## 2021-06-28 DIAGNOSIS — E785 Hyperlipidemia, unspecified: Secondary | ICD-10-CM

## 2021-06-28 HISTORY — DX: Prediabetes: R73.03

## 2021-06-28 LAB — HEPATIC FUNCTION PANEL
ALT: 20 IU/L (ref 0–44)
AST: 21 IU/L (ref 0–40)
Albumin: 4.6 g/dL (ref 3.8–4.9)
Alkaline Phosphatase: 85 IU/L (ref 44–121)
Bilirubin Total: 0.3 mg/dL (ref 0.0–1.2)
Bilirubin, Direct: <0.1 mg/dL (ref 0.00–0.40)
Total Protein: 7.7 g/dL (ref 6.0–8.5)

## 2021-06-28 LAB — CBC
Hematocrit: 46.1 % (ref 37.5–51.0)
Hemoglobin: 15 g/dL (ref 13.0–17.7)
MCH: 28.8 pg (ref 26.6–33.0)
MCHC: 32.5 g/dL (ref 31.5–35.7)
MCV: 89 fL (ref 79–97)
Platelets: 318 10*3/uL (ref 150–450)
RBC: 5.21 x10E6/uL (ref 4.14–5.80)
RDW: 14.4 % (ref 11.6–15.4)
WBC: 5.6 10*3/uL (ref 3.4–10.8)

## 2021-06-28 LAB — TSH: TSH: 1.42 u[IU]/mL (ref 0.450–4.500)

## 2021-06-28 LAB — LIPID PANEL
Chol/HDL Ratio: 4.5 ratio (ref 0.0–5.0)
Cholesterol, Total: 277 mg/dL — ABNORMAL HIGH (ref 100–199)
HDL: 62 mg/dL (ref >39)
LDL Chol Calc (NIH): 180 mg/dL — ABNORMAL HIGH (ref 0–99)
Triglycerides: 191 mg/dL — ABNORMAL HIGH (ref 0–149)
VLDL Cholesterol Cal: 35 mg/dL (ref 5–40)

## 2021-06-28 LAB — HEMOGLOBIN A1C
Est. average glucose Bld gHb Est-mCnc: 126 mg/dL
Hgb A1c MFr Bld: 6 % — ABNORMAL HIGH (ref 4.8–5.6)

## 2021-06-28 LAB — HEPATITIS C ANTIBODY: Hep C Virus Ab: NONREACTIVE

## 2021-06-28 MED ORDER — ATORVASTATIN CALCIUM 20 MG PO TABS: 20.0000 mg | ORAL_TABLET | Freq: Every day | ORAL | 1 refills | Status: DC

## 2021-06-28 NOTE — Progress Notes (Signed)
-   Liver function normal.  ?- Thyroid function normal. ?- No anemia.  ?- Hepatitis C negative.  ? ?The following abnormalities are noted:   ?- Your hemoglobin A1c is consistent with pre-diabetes. Practice healthy eating habits of fresh fruit and vegetables, lean baked meats such as chicken, fish, and [place]; limit breads, rice, pastas, and desserts; practice regular aerobic exercise (at least 150 minutes a week as tolerated). ?- Cholesterol higher than expected. High cholesterol may increase risk of heart attack and/or stroke. Consider eating more fruits, vegetables, and lean baked meats such as chicken or fish. Moderate intensity exercise at least 150 minutes as tolerated per week may help as well.  ? ?All other values are normal, stable or within acceptable limits. ? ?Medication changes / Follow up labs / Other changes or recommendations:   ?- No medication needed as of present for prediabetes. Recheck in 6 months.  ?- Begin Atorvastatin for high cholesterol. Please call our office to have fasting cholesterol rechecked in 4 to 6 weeks.  ? ?The following is for provider reference only: ?The 10-year ASCVD risk score (Arnett DK, et al., 2019) is: 13.5% ?  Values used to calculate the score: ?    Age: 51 years ?    Sex: Male ?    Is Non-Hispanic African American: Yes ?    Diabetic: No ?    Tobacco smoker: No ?    Systolic Blood Pressure: 156 mmHg ?    Is BP treated: Yes ?    HDL Cholesterol: 62 mg/dL ?    Total Cholesterol: 277 mg/dL ? ?Rema Fendt, NP 06/28/2021 1:48 PM  ?

## 2021-07-04 NOTE — Progress Notes (Deleted)
Patient ID: Darren [name], male    DOB: [birth year]  MRN: [id number]  CC: Hypertension Follow-Up  Subjective: Darren [name] is a 51 y.o. male who presents for hypertension follow-up.   His concerns today include:   was seeing Cards in the past and needs referral back for HTN, HLD, CAD, nonrheumatic aortic valve insufficiency  Hypertension follow-up: 06/24/2021: - Continue Amlodipine as prescribed.  - Increase Hydrochlorothiazide from 25 mg daily to 50 mg daily. Instructed to take two 25 mg tablets, patient verbalized understanding.  07/07/2021:   Patient Active Problem List   Diagnosis Date Noted   Prediabetes 06/28/2021   Alcohol dependence in remission (HCC) 09/01/2017   Major depressive disorder, recurrent episode, moderate (HCC) 09/01/2017   Dyslipidemia 02/01/2017   Coronary artery disease involving native heart 02/01/2017   Chest pain in adult 02/01/2017   Hypertension 02/01/2017   Pulmonary emphysema (HCC) 03/24/2016   Angina pectoris (HCC) 11/11/2015   Aortic insufficiency    MDD (major depressive disorder) 08/06/2014   Depression    Coronary artery disease due to lipid rich plaque    Essential hypertension 03/04/2014     Current Outpatient Medications on File Prior to Visit  Medication Sig Dispense Refill   albuterol (VENTOLIN HFA) 108 (90 Base) MCG/ACT inhaler Inhale 2 puffs into the lungs every 6 (six) hours as needed for wheezing or shortness of breath. 18 g 1   amLODipine (NORVASC) 5 MG tablet Take 1 tablet (5 mg total) by mouth daily. 90 tablet 0   aspirin EC 81 MG tablet Take 81 mg by mouth.     atorvastatin (LIPITOR) 20 MG tablet Take 1 tablet (20 mg total) by mouth daily. 90 tablet 1   cyclobenzaprine (FLEXERIL) 5 MG tablet Take 1 tablet (5 mg total) by mouth 3 (three) times daily as needed for muscle spasms. 30 tablet 1   hydrochlorothiazide (HYDRODIURIL) 25 MG tablet Take 1 tablet (25 mg total) by mouth daily. 90 tablet 0   mirtazapine (REMERON) 15  MG tablet Take 1 tablet (15 mg total) by mouth at bedtime. 90 tablet 3   nitroGLYCERIN (NITROSTAT) 0.4 MG SL tablet Place 1 tablet (0.4 mg total) under the tongue every 5 (five) minutes as needed for chest pain. 30 tablet 0   sertraline (ZOLOFT) 100 MG tablet Take 2 tablets (200 mg total) by mouth daily. 180 tablet 3   No current facility-administered medications on file prior to visit.    Allergies  Allergen Reactions   Hydrocodone Hives    Social History   Socioeconomic History   Marital status: Legally Separated    Spouse name: Not on file   Number of children: Not on file   Years of education: Not on file   Highest education level: Not on file  Occupational History   Not on file  Tobacco Use   Smoking status: Former    Packs/day: 0.50    Years: 30.00    Pack years: 15.00    Types: Cigarettes    Quit date: 12/29/2015    Years since quitting: 5.5    Passive exposure: Past   Smokeless tobacco: Never  Vaping Use   Vaping Use: Former  Substance and Sexual Activity   Alcohol use: Yes    Alcohol/week: 2.0 standard drinks    Types: 2 Standard drinks or equivalent per week    Comment: socially   Drug use: Yes    Types: Marijuana    Comment: daily use   Sexual activity:  Not Currently    Birth control/protection: Condom  Other Topics Concern   Not on file  Social History Narrative   Not on file   Social Determinants of Health   Financial Resource Strain: Not on file  Food Insecurity: Not on file  Transportation Needs: Not on file  Physical Activity: Not on file  Stress: Not on file  Social Connections: Not on file  Intimate Partner Violence: Not on file    Family History  Problem Relation Age of Onset   Hypertension Mother    Hypertension Father    Alcohol abuse Father    Hypertension Other    Diabetes Other    Heart attack Other     Past Surgical History:  Procedure Laterality Date   CARDIAC CATHETERIZATION  03/04/2014   PCI of mid LAD   CARDIAC  CATHETERIZATION N/A 11/12/2015   Procedure: Left Heart Cath and Coronary Angiography;  Surgeon: Tonny Bollman, MD;  Location: [facility] INVASIVE CV LAB;  Service: Cardiovascular;  Laterality: N/A;   LEFT HEART CATHETERIZATION WITH CORONARY ANGIOGRAM Bilateral 03/04/2014   Procedure: LEFT HEART CATHETERIZATION WITH CORONARY ANGIOGRAM;  Surgeon: Kathleene Hazel, MD;  Location: [facility] CATH LAB;  Service: Cardiovascular;  Laterality: Bilateral;   LEFT HEART CATHETERIZATION WITH CORONARY ANGIOGRAM N/A 03/26/2014   Procedure: LEFT HEART CATHETERIZATION WITH CORONARY ANGIOGRAM;  Surgeon: Lennette Bihari, MD;  Location: [facility] CATH LAB;  Service: Cardiovascular;  Laterality: N/A;    ROS: Review of Systems Negative except as stated above  PHYSICAL EXAM: There were no vitals taken for this visit.  Physical Exam  {male adult master:310786} {male adult master:310785}     Latest Ref Rng & Units 06/27/2021    8:55 AM 06/07/2021    9:50 AM 10/30/2019    3:27 PM  CMP  Glucose 70 - 99 mg/dL  85   94    BUN 6 - 24 mg/dL  14   14    Creatinine 0.76 - 1.27 mg/dL  9.73   5.32    Sodium 134 - 144 mmol/L  136   140    Potassium 3.5 - 5.2 mmol/L  4.8   4.7    Chloride 96 - 106 mmol/L  100   100    CO2 20 - 29 mmol/L  22   26    Calcium 8.7 - 10.2 mg/dL  9.4   99.2    Total Protein 6.0 - 8.5 g/dL 7.7    8.4    Total Bilirubin 0.0 - 1.2 mg/dL 0.3    0.4    Alkaline Phos 44 - 121 IU/L 85    84    AST 0 - 40 IU/L 21    20    ALT 0 - 44 IU/L 20    17     Lipid Panel     Component Value Date/Time   CHOL 277 (H) 06/27/2021 0855   TRIG 191 (H) 06/27/2021 0855   HDL 62 06/27/2021 0855   CHOLHDL 4.5 06/27/2021 0855   CHOLHDL 3.0 11/12/2015 0942   VLDL 11 11/12/2015 0942   LDLCALC 180 (H) 06/27/2021 0855    CBC    Component Value Date/Time   WBC 5.6 06/27/2021 0855   WBC 7.1 11/12/2015 0149   RBC 5.21 06/27/2021 0855   RBC 4.58 11/12/2015 0149   HGB 15.0 06/27/2021 0855   HCT 46.1 06/27/2021 0855   PLT  318 06/27/2021 0855   MCV 89 06/27/2021 0855   MCH 28.8 06/27/2021  0855   MCH 29.0 11/12/2015 0149   MCHC 32.5 06/27/2021 0855   MCHC 33.0 11/12/2015 0149   RDW 14.4 06/27/2021 0855   LYMPHSABS 1.8 10/22/2014 1134   MONOABS 0.6 10/22/2014 1134   EOSABS 0.1 10/22/2014 1134   BASOSABS 0.0 10/22/2014 1134    ASSESSMENT AND PLAN:  There are no diagnoses linked to this encounter.   Patient was given the opportunity to ask questions.  Patient verbalized understanding of the plan and was able to repeat key elements of the plan. Patient was given clear instructions to go to Emergency Department or return to medical center if symptoms don't improve, worsen, or new problems develop.The patient verbalized understanding.   No orders of the defined types were placed in this encounter.    Requested Prescriptions    No prescriptions requested or ordered in this encounter    No follow-ups on file.  Rema FendtAmy J Vidya Bamford, NP

## 2021-07-07 ENCOUNTER — Ambulatory Visit: Payer: Self-pay | Admitting: Family

## 2021-07-18 ENCOUNTER — Encounter: Payer: Self-pay | Admitting: Family

## 2021-07-18 ENCOUNTER — Other Ambulatory Visit: Payer: Self-pay | Admitting: Family

## 2021-07-18 DIAGNOSIS — E785 Hyperlipidemia, unspecified: Secondary | ICD-10-CM

## 2021-07-18 DIAGNOSIS — I351 Nonrheumatic aortic (valve) insufficiency: Secondary | ICD-10-CM

## 2021-07-18 DIAGNOSIS — I10 Essential (primary) hypertension: Secondary | ICD-10-CM

## 2021-07-18 DIAGNOSIS — I2510 Atherosclerotic heart disease of native coronary artery without angina pectoris: Secondary | ICD-10-CM

## 2021-07-18 DIAGNOSIS — Z789 Other specified health status: Secondary | ICD-10-CM

## 2021-09-07 ENCOUNTER — Other Ambulatory Visit: Payer: Self-pay | Admitting: Family

## 2021-09-07 ENCOUNTER — Other Ambulatory Visit: Payer: Self-pay | Admitting: Physician Assistant

## 2021-09-07 DIAGNOSIS — I10 Essential (primary) hypertension: Secondary | ICD-10-CM

## 2021-09-07 DIAGNOSIS — F331 Major depressive disorder, recurrent, moderate: Secondary | ICD-10-CM

## 2021-09-08 NOTE — Telephone Encounter (Signed)
Requested Prescriptions  Pending Prescriptions Disp Refills  . hydrochlorothiazide (HYDRODIURIL) 25 MG tablet [Pharmacy Med Name: hydroCHLOROthiazide 25 MG Oral Tablet] 90 tablet 0    Sig: Take 1 tablet by mouth once daily     Cardiovascular: Diuretics - Thiazide Failed - 09/07/2021  7:55 PM      Failed - Last BP in normal range    BP Readings from Last 1 Encounters:  06/24/21 (!) 156/72         Passed - Cr in normal range and within 180 days    Creatinine, Ser  Date Value Ref Range Status  06/07/2021 1.07 0.76 - 1.27 mg/dL Final         Passed - K in normal range and within 180 days    Potassium  Date Value Ref Range Status  06/07/2021 4.8 3.5 - 5.2 mmol/L Final         Passed - Na in normal range and within 180 days    Sodium  Date Value Ref Range Status  06/07/2021 136 134 - 144 mmol/L Final         Passed - Valid encounter within last 6 months    Recent Outpatient Visits          2 months ago Annual physical exam   [facility] at [facility], Amy J, NP   3 months ago Encounter to establish care   Primary Care at [facility], Amy J, NP   1 year ago Essential hypertension   Primary Care at [facility], Asencion Partridge, MD   2 years ago Essential hypertension   Primary Care at [facility], Manus Rudd, MD   3 years ago Abnormal TSH   Primary Care at [facility], Manus Rudd, MD

## 2021-09-08 NOTE — Telephone Encounter (Signed)
Requested medication (s) are due for refill today: Yes  Requested medication (s) are on the active medication list: Yes  Last refill:  08/31/20  Future visit scheduled: No  Notes to clinic:  Prescription has expired.    Requested Prescriptions  Pending Prescriptions Disp Refills   sertraline (ZOLOFT) 100 MG tablet [Pharmacy Med Name: Sertraline HCl 100 MG Oral Tablet] 180 tablet 0    Sig: Take 2 tablets by mouth once daily     Psychiatry:  Antidepressants - SSRI - sertraline Passed - 09/08/2021  8:24 AM      Passed - AST in normal range and within 360 days    AST  Date Value Ref Range Status  06/27/2021 21 0 - 40 IU/L Final         Passed - ALT in normal range and within 360 days    ALT  Date Value Ref Range Status  06/27/2021 20 0 - 44 IU/L Final         Passed - Completed PHQ-2 or PHQ-9 in the last 360 days      Passed - Valid encounter within last 6 months    Recent Outpatient Visits           2 months ago Annual physical exam   Primary Care at [facility], Amy J, NP   3 months ago Encounter to establish care   Primary Care at [facility], Amy J, NP   1 year ago Essential hypertension   Primary Care at [facility], Asencion Partridge, MD   2 years ago Essential hypertension   Primary Care at [facility], [place] A, MD   3 years ago Abnormal TSH   Primary Care at [facility], Manus Rudd, MD              Signed Prescriptions Disp Refills   amLODipine (NORVASC) 5 MG tablet 90 tablet 0    Sig: Take 1 tablet by mouth once daily     Cardiovascular: Calcium Channel Blockers 2 Failed - 09/08/2021  8:24 AM      Failed - Last BP in normal range    BP Readings from Last 1 Encounters:  06/24/21 (!) 156/72         Passed - Last Heart Rate in normal range    Pulse Readings from Last 1 Encounters:  06/24/21 67         Passed - Valid encounter within last 6 months    Recent Outpatient Visits           2 months ago Annual physical exam    Primary Care at [facility], Amy J, NP   3 months ago Encounter to establish care   Primary Care at [facility], Amy J, NP   1 year ago Essential hypertension   Primary Care at [facility], Asencion Partridge, MD   2 years ago Essential hypertension   Primary Care at [facility], Manus Rudd, MD   3 years ago Abnormal TSH   Primary Care at [facility], Manus Rudd, MD

## 2021-09-19 ENCOUNTER — Other Ambulatory Visit: Payer: Self-pay | Admitting: Physician Assistant

## 2021-09-19 DIAGNOSIS — F331 Major depressive disorder, recurrent, moderate: Secondary | ICD-10-CM

## 2021-10-30 NOTE — Progress Notes (Signed)
Called patient x 3 and forwarded directly to voicemail.

## 2021-11-07 ENCOUNTER — Encounter: Payer: Self-pay | Admitting: Family

## 2021-11-07 DIAGNOSIS — I10 Essential (primary) hypertension: Secondary | ICD-10-CM

## 2021-11-07 DIAGNOSIS — I351 Nonrheumatic aortic (valve) insufficiency: Secondary | ICD-10-CM

## 2021-11-07 DIAGNOSIS — F329 Major depressive disorder, single episode, unspecified: Secondary | ICD-10-CM

## 2021-11-07 DIAGNOSIS — I2510 Atherosclerotic heart disease of native coronary artery without angina pectoris: Secondary | ICD-10-CM

## 2021-11-07 DIAGNOSIS — E785 Hyperlipidemia, unspecified: Secondary | ICD-10-CM

## 2021-11-08 ENCOUNTER — Emergency Department (HOSPITAL_COMMUNITY)
Admission: EM | Admit: 2021-11-08 | Discharge: 2021-11-08 | Disposition: A | Discharge status: Home / Self Care | Payer: Self-pay | Attending: Emergency Medicine | Admitting: Emergency Medicine

## 2021-11-08 ENCOUNTER — Encounter (HOSPITAL_COMMUNITY): Payer: Self-pay

## 2021-11-08 ENCOUNTER — Emergency Department (HOSPITAL_COMMUNITY): Payer: Self-pay

## 2021-11-08 DIAGNOSIS — Z7982 Long term (current) use of aspirin: Secondary | ICD-10-CM | POA: Insufficient documentation

## 2021-11-08 DIAGNOSIS — J029 Acute pharyngitis, unspecified: Secondary | ICD-10-CM | POA: Insufficient documentation

## 2021-11-08 DIAGNOSIS — Z79899 Other long term (current) drug therapy: Secondary | ICD-10-CM | POA: Insufficient documentation

## 2021-11-08 DIAGNOSIS — Z20822 Contact with and (suspected) exposure to covid-19: Secondary | ICD-10-CM | POA: Insufficient documentation

## 2021-11-08 DIAGNOSIS — I2510 Atherosclerotic heart disease of native coronary artery without angina pectoris: Secondary | ICD-10-CM | POA: Insufficient documentation

## 2021-11-08 DIAGNOSIS — J069 Acute upper respiratory infection, unspecified: Secondary | ICD-10-CM

## 2021-11-08 DIAGNOSIS — B9789 Other viral agents as the cause of diseases classified elsewhere: Secondary | ICD-10-CM | POA: Insufficient documentation

## 2021-11-08 DIAGNOSIS — I10 Essential (primary) hypertension: Secondary | ICD-10-CM | POA: Insufficient documentation

## 2021-11-08 DIAGNOSIS — F17200 Nicotine dependence, unspecified, uncomplicated: Secondary | ICD-10-CM | POA: Insufficient documentation

## 2021-11-08 LAB — SARS CORONAVIRUS 2 BY RT PCR: SARS Coronavirus 2 by RT PCR: NEGATIVE

## 2021-11-08 MED ORDER — BENZONATATE 100 MG PO CAPS: 100.0000 mg | ORAL_CAPSULE | Freq: Two times a day (BID) | ORAL | 0 refills | Status: DC | PRN

## 2021-11-08 MED ORDER — LIDOCAINE VISCOUS HCL 2 % MT SOLN: 15.0000 mL | OROMUCOSAL | 0 refills | Status: DC | PRN

## 2021-11-08 NOTE — ED Provider Notes (Signed)
[facility]-EMERGENCY DEPT Provider Note   CSN: [id number] Arrival date & time: 11/08/21  2878     History  Chief Complaint  Patient presents with   URI    Darren [name] is a 51 y.o. male with medical history of CAD, anxiety, depression, hypertension, tobacco abuse.  Patient presents to ED for evaluation of cough, sore throat, generalized body aches and chills for the last 3 days.  Patient unsure of sick contacts.  Patient reports that cough has been productive.  Patient denies any objective fevers, nausea, vomiting, diarrhea, abdominal pain, chest pain or shortness of breath.   URI Presenting symptoms: cough and sore throat   Presenting symptoms: no fever   Associated symptoms: myalgias        Home Medications Prior to Admission medications   Medication Sig Start Date End Date Taking? Authorizing Provider  benzonatate (TESSALON) 100 MG capsule Take 1 capsule (100 mg total) by mouth 2 (two) times daily as needed for cough. 11/08/21  Yes Al Decant, PA-C  lidocaine (XYLOCAINE) 2 % solution Use as directed 15 mLs in the mouth or throat as needed for mouth pain. 11/08/21  Yes Al Decant, PA-C  albuterol (VENTOLIN HFA) 108 (90 Base) MCG/ACT inhaler Inhale 2 puffs into the lungs every 6 (six) hours as needed for wheezing or shortness of breath. 12/25/18   Doristine Bosworth, MD  amLODipine (NORVASC) 5 MG tablet Take 1 tablet by mouth once daily 09/08/21   Rema Fendt, NP  aspirin EC 81 MG tablet Take 81 mg by mouth.    [provider]  atorvastatin (LIPITOR) 20 MG tablet Take 1 tablet (20 mg total) by mouth daily. 06/28/21 12/25/21  Rema Fendt, NP  cyclobenzaprine (FLEXERIL) 5 MG tablet Take 1 tablet (5 mg total) by mouth 3 (three) times daily as needed for muscle spasms. 06/24/21   Rema Fendt, NP  hydrochlorothiazide (HYDRODIURIL) 25 MG tablet Take 1 tablet by mouth once daily 09/08/21   Rema Fendt, NP  mirtazapine  (REMERON) 15 MG tablet Take 1 tablet (15 mg total) by mouth at bedtime. 08/31/20 08/31/21  Mayers, Cari S, PA-C  nitroGLYCERIN (NITROSTAT) 0.4 MG SL tablet Place 1 tablet (0.4 mg total) under the tongue every 5 (five) minutes as needed for chest pain. 09/01/17   Doristine Bosworth, MD  sertraline (ZOLOFT) 100 MG tablet Take 2 tablets (200 mg total) by mouth daily. 08/31/20 08/31/21  Mayers, Cari S, PA-C      Allergies    Hydrocodone    Review of Systems   Review of Systems  Constitutional:  Positive for chills. Negative for fever.  HENT:  Positive for sore throat.   Respiratory:  Positive for cough. Negative for shortness of breath.   Cardiovascular:  Negative for chest pain.  Gastrointestinal:  Negative for abdominal pain, diarrhea, nausea and vomiting.  Musculoskeletal:  Positive for myalgias.  All other systems reviewed and are negative.   Physical Exam Updated Vital Signs BP (!) 159/87 (BP Location: Right Arm)   Pulse 63   Temp 99 F (37.2 C) (Oral)   Resp 17   SpO2 99%  Physical Exam Vitals and nursing note reviewed.  Constitutional:      General: He is not in acute distress.    Appearance: Normal appearance. He is not ill-appearing, toxic-appearing or diaphoretic.  HENT:     Head: Normocephalic and atraumatic.     Nose: Nose normal. No congestion.  Mouth/Throat:     Mouth: Mucous membranes are moist.     Pharynx: Oropharynx is clear. Posterior oropharyngeal erythema present. No oropharyngeal exudate.  Eyes:     Extraocular Movements: Extraocular movements intact.     Conjunctiva/sclera: Conjunctivae normal.     Pupils: Pupils are equal, round, and reactive to light.  Cardiovascular:     Rate and Rhythm: Normal rate and regular rhythm.  Pulmonary:     Effort: Pulmonary effort is normal.     Breath sounds: Normal breath sounds. No wheezing.  Abdominal:     General: Abdomen is flat. Bowel sounds are normal.     Palpations: Abdomen is soft.     Tenderness: There is no  abdominal tenderness.  Musculoskeletal:     Cervical back: Normal range of motion and neck supple. No tenderness.  Skin:    General: Skin is warm and dry.     Capillary Refill: Capillary refill takes less than 2 seconds.  Neurological:     Mental Status: He is alert and oriented to person, place, and time.     ED Results / Procedures / Treatments   Labs (all labs ordered are listed, but only abnormal results are displayed) Labs Reviewed  SARS CORONAVIRUS 2 BY RT PCR    EKG None  Radiology DG Chest Portable 1 View  Result Date: 11/08/2021 CLINICAL DATA:  Shortness of breath, cough, rule out pneumonia EXAM: PORTABLE CHEST 1 VIEW COMPARISON:  None Available. FINDINGS: The heart size and mediastinal contours are within normal limits. Both lungs are clear. The visualized skeletal structures are unremarkable. IMPRESSION: No active disease. Electronically Signed   By: Larose Hires D.O.   On: 11/08/2021 13:07    Procedures Procedures   Medications Ordered in ED Medications - No data to display  ED Course/ Medical Decision Making/ A&P                           Medical Decision Making  51 year old male presents to the ED for evaluation.  Please see HPI for further details.  On examination patient afebrile and nontachycardic.  Patient lung sounds clear bilaterally, not hypoxic.  Patient abdomen soft and compressible.  Patient neurological examination shows no focal neurodeficits.  Patient posterior oropharynx is erythematous without findings of exudate.  The patient uvula is midline, there is no drooling, he is handling secretions appropriately.  Patient viral testing negative for COVID-19.  Patient chest x-ray clear, no signs of consolidations or effusions.  Most likely cause of patient's symptoms at this time due to viral illness.  The patient will be advised to treat his symptoms at home.  I will prescribe the patient Tessalon Perles, viscous lidocaine.  I have given the patient  return precautions and he is voiced understanding.  I have answered all the questions of the patient to satisfaction.  The patient stable at this time for discharge home.  Final Clinical Impression(s) / ED Diagnoses Final diagnoses:  Viral URI with cough    Rx / DC Orders ED Discharge Orders          Ordered    lidocaine (XYLOCAINE) 2 % solution  As needed        11/08/21 1317    benzonatate (TESSALON) 100 MG capsule  2 times daily PRN        11/08/21 1317              Al Decant, PA-C 11/08/21 1318  Lorre Nick, MD 11/09/21 [phone]

## 2021-11-08 NOTE — Discharge Instructions (Addendum)
Please return to ED with any new or worsening signs or symptoms Please follow-up with PCP Please continue treating your symptoms at home utilizing ibuprofen and Tylenol Please continue pushing fluids with electrolyte supplementation beverages.  Please also continue eating a diet high in protein low in fat Please read attached guide concerning upper respiratory infections Please take Tessalon Perles for cough.  Please utilize viscous lidocaine for sore throat as needed.

## 2021-11-08 NOTE — ED Triage Notes (Signed)
Pt c/o three days of sore throat, cough, chills, generalized body aches.

## 2022-02-02 ENCOUNTER — Other Ambulatory Visit: Payer: Self-pay | Admitting: Family

## 2022-02-02 DIAGNOSIS — I10 Essential (primary) hypertension: Secondary | ICD-10-CM

## 2022-04-17 NOTE — Progress Notes (Unsigned)
Patient ID: Darren [name], male    DOB: Jul 22, 1970  MRN: [id number]  CC: Medication Refills   Subjective: Darren [name] is a 52 y.o. male who presents for medication refills. He is accompanied by his significant other.  His concerns today include:  Requests refills on Hydrochlorothiazide, Amlodipine, Zoloft, and Mirtazapine. Reports he has not taken any medications for 2 months. He denies red flag symptoms such as but not limited to chest pain, shortness of breath, worst headache of life, nausea/vomiting. He is not checking his blood pressure outside of office. His significant other states patient is established with a cardiologist. Further upon my review seems in the past the referral was closed due to their office attempted to contact the patient x 3 without success. In regards to depression he denies thoughts of self-harm, suicidal ideations, homicidal ideations. No further issues/concerns.     04/20/2022    8:39 AM 06/24/2021    8:13 AM 06/07/2021    8:27 AM 10/30/2019   12:15 PM 12/25/2018    8:59 AM  Depression screen PHQ 2/9  Decreased Interest 0 0 0 0 0  Down, Depressed, Hopeless 0 0 0 0 0  PHQ - 2 Score 0 0 0 0 0  Altered sleeping   0  1  Tired, decreased energy   0  0  Change in appetite   0  0  Feeling bad or failure about yourself    0  0  Trouble concentrating   0  1  Moving slowly or fidgety/restless   0  0  Suicidal thoughts   0  0  PHQ-9 Score   0  2  Difficult doing work/chores  Not difficult at all Not difficult at all      Patient Active Problem List   Diagnosis Date Noted   Prediabetes 06/28/2021   Major depressive disorder, recurrent episode, moderate ([place]) 09/01/2017   Dyslipidemia 02/01/2017   Coronary artery disease involving native heart 02/01/2017   Chest pain in adult 02/01/2017   Hypertension 02/01/2017   Pulmonary emphysema ([place]) 03/24/2016   Angina pectoris ([place]) 11/11/2015   Aortic insufficiency    MDD (major depressive disorder) 08/06/2014    Depression    Coronary artery disease due to lipid rich plaque    Essential hypertension 03/04/2014     Current Outpatient Medications on File Prior to Visit  Medication Sig Dispense Refill   albuterol (VENTOLIN HFA) 108 (90 Base) MCG/ACT inhaler Inhale 2 puffs into the lungs every 6 (six) hours as needed for wheezing or shortness of breath. 18 g 1   aspirin EC 81 MG tablet Take 81 mg by mouth.     cyclobenzaprine (FLEXERIL) 5 MG tablet Take 1 tablet (5 mg total) by mouth 3 (three) times daily as needed for muscle spasms. 30 tablet 1   nitroGLYCERIN (NITROSTAT) 0.4 MG SL tablet Place 1 tablet (0.4 mg total) under the tongue every 5 (five) minutes as needed for chest pain. 30 tablet 0   No current facility-administered medications on file prior to visit.    Allergies  Allergen Reactions   Hydrocodone Hives    Social History   Socioeconomic History   Marital status: Legally Separated    Spouse name: Not on file   Number of children: Not on file   Years of education: Not on file   Highest education level: Not on file  Occupational History   Not on file  Tobacco Use   Smoking status: Former  Packs/day: 0.50    Years: 30.00    Total pack years: 15.00    Types: Cigarettes    Quit date: 12/29/2015    Years since quitting: 6.3    Passive exposure: Past   Smokeless tobacco: Never  Vaping Use   Vaping Use: Former  Substance and Sexual Activity   Alcohol use: Yes    Alcohol/week: 2.0 standard drinks of alcohol    Types: 2 Standard drinks or equivalent per week    Comment: socially   Drug use: Yes    Types: Marijuana    Comment: daily use   Sexual activity: Not Currently    Birth control/protection: Condom  Other Topics Concern   Not on file  Social History Narrative   Not on file   Social Determinants of Health   Financial Resource Strain: Not on file  Food Insecurity: Not on file  Transportation Needs: Not on file  Physical Activity: Not on file  Stress: Not  on file  Social Connections: Not on file  Intimate Partner Violence: Not on file    Family History  Problem Relation Age of Onset   Hypertension Mother    Hypertension Father    Alcohol abuse Father    Hypertension Other    Diabetes Other    Heart attack Other     Past Surgical History:  Procedure Laterality Date   CARDIAC CATHETERIZATION  03/04/2014   PCI of mid LAD   CARDIAC CATHETERIZATION N/A 11/12/2015   Procedure: Left Heart Cath and Coronary Angiography;  Surgeon: Sherren Mocha, MD;  Location: [facility];  Service: Cardiovascular;  Laterality: N/A;   LEFT HEART CATHETERIZATION WITH CORONARY ANGIOGRAM Bilateral 03/04/2014   Procedure: LEFT HEART CATHETERIZATION WITH CORONARY ANGIOGRAM;  Surgeon: Burnell Blanks, MD;  Location: [facility] CATH LAB;  Service: Cardiovascular;  Laterality: Bilateral;   LEFT HEART CATHETERIZATION WITH CORONARY ANGIOGRAM N/A 03/26/2014   Procedure: LEFT HEART CATHETERIZATION WITH CORONARY ANGIOGRAM;  Surgeon: Troy Sine, MD;  Location: [facility] CATH LAB;  Service: Cardiovascular;  Laterality: N/A;    ROS: Review of Systems Negative except as stated above  PHYSICAL EXAM: BP (!) 147/70 (BP Location: Left Arm, Patient Position: Sitting, Cuff Size: Normal)   Pulse 68   Temp 98.3 F (36.8 C)   Resp 16   Ht 5' 6.14" (1.68 m)   Wt 160 lb (72.6 kg)   SpO2 99%   BMI 25.71 kg/m   Physical Exam HENT:     Head: Normocephalic and atraumatic.  Eyes:     Extraocular Movements: Extraocular movements intact.     Conjunctiva/sclera: Conjunctivae normal.     Pupils: Pupils are equal, round, and reactive to light.  Cardiovascular:     Rate and Rhythm: Normal rate and regular rhythm.     Pulses: Normal pulses.     Heart sounds: Normal heart sounds.  Pulmonary:     Effort: Pulmonary effort is normal.     Breath sounds: Normal breath sounds.  Musculoskeletal:     Cervical back: Normal range of motion and neck supple.  Neurological:     General:  No focal deficit present.     Mental Status: He is alert and oriented to person, place, and time.  Psychiatric:        Mood and Affect: Mood normal.        Behavior: Behavior normal.     ASSESSMENT AND PLAN: 1. Primary hypertension 2. Hyperlipidemia, unspecified hyperlipidemia type 3. Coronary artery disease due to  lipid rich plaque 4. Aortic valve insufficiency, etiology of cardiac valve disease unspecified 5. Angina pectoris ([place]) 6. Coronary artery disease involving native coronary artery of native heart, unspecified whether angina present - Blood pressure not at goal during today's visit. Patient asymptomatic without chest pressure, chest pain, palpitations, shortness of breath, worst headache of life, and any additional red flag symptoms.  - Patient has not taken blood pressure medications in 2 months. - Resume Amlodipine and Hydrochlorothiazide as prescribed.  - Routine screening.  - Counseled on blood pressure goal of less than 130/80, low-sodium, DASH diet, medication compliance, and 150 minutes of moderate intensity exercise per week as tolerated. Counseled on medication adherence and adverse effects. - Referral to Cardiology for further evaluation/management. During the interim follow-up with primary provider in 2 to 4 weeks or sooner if needed for blood pressure check if not already established with Cardiology.  - Ambulatory referral to Cardiology - amLODipine (NORVASC) 5 MG tablet; Take 1 tablet (5 mg total) by mouth daily.  Dispense: 30 tablet; Refill: 2 - hydrochlorothiazide (HYDRODIURIL) 25 MG tablet; Take 1 tablet (25 mg total) by mouth daily.  Dispense: 30 tablet; Refill: 2 - Basic Metabolic Panel - Lipid panel  7. Prediabetes - Routine screening.  - Hemoglobin A1c  8. Depression, unspecified depression type - Patient denies thoughts of self-harm, suicidal ideations, homicidal ideations. - Sertraline and Mirtazapine as prescribed.  - Follow-up with primary provider  in 3 months or sooner if needed.  - sertraline (ZOLOFT) 100 MG tablet; Take 2 tablets (200 mg total) by mouth daily.  Dispense: 60 tablet; Refill: 2 - mirtazapine (REMERON) 15 MG tablet; Take 1 tablet (15 mg total) by mouth at bedtime.  Dispense: 30 tablet; Refill: 2   Patient was given the opportunity to ask questions.  Patient verbalized understanding of the plan and was able to repeat key elements of the plan. Patient was given clear instructions to go to Emergency Department or return to medical center if symptoms don't improve, worsen, or new problems develop.The patient verbalized understanding.   Orders Placed This Encounter  Procedures   Basic Metabolic Panel   Hemoglobin A1c   Lipid panel   Ambulatory referral to Cardiology     Requested Prescriptions   Signed Prescriptions Disp Refills   amLODipine (NORVASC) 5 MG tablet 30 tablet 2    Sig: Take 1 tablet (5 mg total) by mouth daily.   hydrochlorothiazide (HYDRODIURIL) 25 MG tablet 30 tablet 2    Sig: Take 1 tablet (25 mg total) by mouth daily.   sertraline (ZOLOFT) 100 MG tablet 60 tablet 2    Sig: Take 2 tablets (200 mg total) by mouth daily.   mirtazapine (REMERON) 15 MG tablet 30 tablet 2    Sig: Take 1 tablet (15 mg total) by mouth at bedtime.    Follow-up with primary provider as scheduled.   Camillia Herter, NP

## 2022-04-20 ENCOUNTER — Ambulatory Visit (INDEPENDENT_AMBULATORY_CARE_PROVIDER_SITE_OTHER): Payer: Self-pay | Admitting: Family

## 2022-04-20 VITALS — BP 147/70 | HR 68 | Temp 98.3°F | Resp 16 | Ht 66.14 in | Wt 160.0 lb

## 2022-04-20 DIAGNOSIS — F32A Depression, unspecified: Secondary | ICD-10-CM

## 2022-04-20 DIAGNOSIS — I2583 Coronary atherosclerosis due to lipid rich plaque: Secondary | ICD-10-CM

## 2022-04-20 DIAGNOSIS — I351 Nonrheumatic aortic (valve) insufficiency: Secondary | ICD-10-CM

## 2022-04-20 DIAGNOSIS — I2510 Atherosclerotic heart disease of native coronary artery without angina pectoris: Secondary | ICD-10-CM

## 2022-04-20 DIAGNOSIS — R7303 Prediabetes: Secondary | ICD-10-CM

## 2022-04-20 DIAGNOSIS — I10 Essential (primary) hypertension: Secondary | ICD-10-CM

## 2022-04-20 DIAGNOSIS — E785 Hyperlipidemia, unspecified: Secondary | ICD-10-CM

## 2022-04-20 DIAGNOSIS — I209 Angina pectoris, unspecified: Secondary | ICD-10-CM

## 2022-04-20 MED ORDER — MIRTAZAPINE 15 MG PO TABS: 15.0000 mg | ORAL_TABLET | Freq: Every day | ORAL | 2 refills | Status: DC

## 2022-04-20 MED ORDER — SERTRALINE HCL 100 MG PO TABS: 200.0000 mg | ORAL_TABLET | Freq: Every day | ORAL | 2 refills | Status: DC

## 2022-04-20 MED ORDER — AMLODIPINE BESYLATE 5 MG PO TABS: 5.0000 mg | ORAL_TABLET | Freq: Every day | ORAL | 2 refills | Status: DC

## 2022-04-20 MED ORDER — HYDROCHLOROTHIAZIDE 25 MG PO TABS: 25.0000 mg | ORAL_TABLET | Freq: Every day | ORAL | 2 refills | Status: DC

## 2022-04-20 NOTE — Progress Notes (Signed)
Pt presents for medication refills  -HCTZ, amlodipine, Zoloft and mirtazapine -been out of medications for about 2 months

## 2022-04-21 LAB — BASIC METABOLIC PANEL
BUN/Creatinine Ratio: 13 (ref 9–20)
BUN: 12 mg/dL (ref 6–24)
CO2: 22 mmol/L (ref 20–29)
Calcium: 9.2 mg/dL (ref 8.7–10.2)
Chloride: 104 mmol/L (ref 96–106)
Creatinine, Ser: 0.96 mg/dL (ref 0.76–1.27)
Glucose: 92 mg/dL (ref 70–99)
Potassium: 4.6 mmol/L (ref 3.5–5.2)
Sodium: 141 mmol/L (ref 134–144)
eGFR: 95 mL/min/{1.73_m2} (ref >59)

## 2022-04-21 LAB — LIPID PANEL
Chol/HDL Ratio: 4.2 ratio (ref 0.0–5.0)
Cholesterol, Total: 228 mg/dL — ABNORMAL HIGH (ref 100–199)
HDL: 54 mg/dL (ref >39)
LDL Chol Calc (NIH): 153 mg/dL — ABNORMAL HIGH (ref 0–99)
Triglycerides: 119 mg/dL (ref 0–149)
VLDL Cholesterol Cal: 21 mg/dL (ref 5–40)

## 2022-04-21 LAB — HEMOGLOBIN A1C
Est. average glucose Bld gHb Est-mCnc: 123 mg/dL
Hgb A1c MFr Bld: 5.9 % — ABNORMAL HIGH (ref 4.8–5.6)

## 2022-04-25 ENCOUNTER — Ambulatory Visit: Payer: Self-pay | Admitting: Physician Assistant

## 2023-04-07 ENCOUNTER — Emergency Department (HOSPITAL_COMMUNITY): Payer: Self-pay

## 2023-04-07 ENCOUNTER — Emergency Department (HOSPITAL_COMMUNITY)
Admission: EM | Admit: 2023-04-07 | Discharge: 2023-04-07 | Disposition: A | Discharge status: Home / Self Care | Payer: Self-pay | Attending: Emergency Medicine | Admitting: Emergency Medicine

## 2023-04-07 DIAGNOSIS — W1839XA Other fall on same level, initial encounter: Secondary | ICD-10-CM | POA: Insufficient documentation

## 2023-04-07 DIAGNOSIS — I10 Essential (primary) hypertension: Secondary | ICD-10-CM | POA: Insufficient documentation

## 2023-04-07 DIAGNOSIS — S82842A Displaced bimalleolar fracture of left lower leg, initial encounter for closed fracture: Secondary | ICD-10-CM | POA: Diagnosis not present

## 2023-04-07 DIAGNOSIS — Y9351 Activity, roller skating (inline) and skateboarding: Secondary | ICD-10-CM | POA: Diagnosis not present

## 2023-04-07 DIAGNOSIS — S8992XA Unspecified injury of left lower leg, initial encounter: Secondary | ICD-10-CM | POA: Diagnosis present

## 2023-04-07 MED ORDER — SODIUM CHLORIDE 0.9 % IV BOLUS
1000.0000 mL | Freq: Once | INTRAVENOUS | Status: AC
  Administered 2023-04-07: 1000 mL via INTRAVENOUS

## 2023-04-07 MED ORDER — PROPOFOL 10 MG/ML IV BOLUS
200.0000 mg | Freq: Once | INTRAVENOUS | Status: AC
  Filled 2023-04-07: qty 20

## 2023-04-07 MED ORDER — OXYCODONE HCL 5 MG PO TABS
5.0000 mg | ORAL_TABLET | Freq: Once | ORAL | Status: AC
  Administered 2023-04-07: 5 mg via ORAL
  Filled 2023-04-07: qty 1

## 2023-04-07 MED ORDER — OXYCODONE HCL 5 MG PO TABS: 5.0000 mg | ORAL_TABLET | Freq: Four times a day (QID) | ORAL | 0 refills | Status: AC | PRN

## 2023-04-07 MED ORDER — PROPOFOL 10 MG/ML IV BOLUS
INTRAVENOUS | Status: AC | PRN
  Administered 2023-04-07: 50 mg via INTRAVENOUS
  Administered 2023-04-07: 20 mg via INTRAVENOUS

## 2023-04-07 MED ORDER — PROPOFOL 10 MG/ML IV BOLUS
INTRAVENOUS | Status: AC
  Administered 2023-04-07: 200 mg via INTRAVENOUS
  Filled 2023-04-07: qty 20

## 2023-04-07 MED ORDER — MORPHINE SULFATE (PF) 4 MG/ML IV SOLN
4.0000 mg | Freq: Once | INTRAVENOUS | Status: AC
  Administered 2023-04-07: 4 mg via INTRAVENOUS
  Filled 2023-04-07: qty 1

## 2023-04-07 MED ORDER — ONDANSETRON HCL 4 MG/2ML IJ SOLN
4.0000 mg | Freq: Once | INTRAMUSCULAR | Status: AC
  Administered 2023-04-07: 4 mg via INTRAVENOUS
  Filled 2023-04-07: qty 2

## 2023-04-07 MED ORDER — PROPOFOL 10 MG/ML IV BOLUS
INTRAVENOUS | Status: AC | PRN
  Administered 2023-04-07 (×2): 50 mg via INTRAVENOUS
  Administered 2023-04-07 (×2): 30 mg via INTRAVENOUS

## 2023-04-07 NOTE — Sedation Documentation (Addendum)
 Xray called to come to bedside to verify reduction. Pt remains in pain while wrapping. Plan will be to admin additional prop (see MAR)

## 2023-04-07 NOTE — Sedation Documentation (Signed)
Reduction performed 

## 2023-04-07 NOTE — Discharge Instructions (Addendum)
 Seen in the emergency department for an ankle injury.  You were found to have a fracture-dislocation of your left ankle.  We reduced this and placed you in a splint.  You should remain nonweightbearing and use crutches for getting around.  Please contact Dr. Sharl at the phone number above first thing on Monday to arrange close follow-up and surgical planning. You can take Tylenol  and ibuprofen  as needed for pain.  We are additionally sending in a prescription for stronger pain medication for breakthrough pain. If you experience significantly increased pain despite the above pain medications, new numbness or tingling, or any other concerns, return to the ED for reevaluation.

## 2023-04-07 NOTE — ED Notes (Signed)
 Patient transported to CT

## 2023-04-07 NOTE — ED Provider Notes (Signed)
 [place] EMERGENCY DEPARTMENT AT [facility] Provider Note  MDM   HPI/ROS:  Darren [name] is a 53 y.o. male with pertinent past medical history of hypertension who presents for evaluation of leg injury. Darren [name] reports he was rollerskating around 12:50 PM when his left leg slipped out from under him, causing him to fall backwards and onto his left leg.  He heard a snap and experienced immediate onset of pain with deformity to his left ankle.  He denies any associated numbness or tingling.  He additionally denies any neck pain.  He received pain medication en route with EMS.  He has not take any anticoagulants.   Physical exam is notable for: - Obvious deformity to the left ankle with tenderness to palpation most significant over the lateral malleolus.  Able to wiggle toes.  Sensation intact to light touch throughout.  2+ DP pulse  On my initial evaluation, Darren [name] is:  -Vital signs stable. Darren [name] afebrile, hemodynamically stable, and non-toxic appearing. -Additional history obtained from Darren [name]'s Darren [name].  This Darren [name]'s current presentation, including their history and physical exam, is most consistent with left ankle fracture versus dislocation.  Extremity is neurovascularly intact.  Low suspicion for Achilles tendon tear.  Cervical collar clinically cleared at bedside.   X-rays obtained and demonstrates significantly displaced lateral malleolus fracture.  Darren [name] received multiple rounds of IV narcotic pain medication for analgesia.  Conscious sedation performed with propofol  and reduction and splinting performed as below.  Post reduction x-rays with near anatomical alignment.  Discussed case with Dr. Sharl with orthopedics who recommends obtaining CT of the ankle to assist with surgical planning in the outpatient setting so this was ordered.  Darren [name] was advised on nonweightbearing status.  He was given contact information for Dr. Sharl office with instructions to call first thing  on Monday.  He and Darren [name] expressed understanding of the plan.  He was advised to take Tylenol  or ibuprofen  as needed for pain as well as prescription sent for oxycodone  for breakthrough pain.  They were given strict return precautions.     Disposition:  I discussed the plan for discharge with the Darren [name] and/or their surrogate at bedside prior to discharge and they were in agreement with the plan and verbalized understanding of the return precautions provided. All questions answered to the best of my ability. Ultimately, the Darren [name] was discharged in stable condition with stable vital signs. I am reassured that they are capable of close follow up and good social support at home.   Clinical Impression:  1. Closed bimalleolar fracture of left ankle, initial encounter     Rx / DC Orders ED Discharge Orders          Ordered    oxyCODONE  (ROXICODONE ) 5 MG immediate release tablet  Every 6 hours PRN        04/07/23 1947            The plan for this Darren [name] was discussed with Dr. Patt, who voiced agreement and who oversaw evaluation and treatment of this Darren [name].   Clinical Complexity A medically appropriate history, review of systems, and physical exam was performed.  My independent interpretations of EKG, labs, and radiology are documented in the ED course above.   If decision rules were used in this Darren [name]'s evaluation, they are listed below.   Darren [name]'s presentation is most consistent with acute presentation with potential threat to life or bodily function.  Medical Decision Making Amount and/or Complexity of Data Reviewed Radiology: ordered.  Risk  Prescription drug management.    HPI/ROS      See MDM section for pertinent HPI and ROS. A complete ROS was performed with pertinent positives/negatives noted above.   Past Medical History:  Diagnosis Date   Anxiety    Aortic insufficiency    moderate to severe by echo 02/2020   Coronary artery disease 03/24/2014   NSTEMI  s/p PCI of mid LAD and repeat cath with patent stent   Depression    Hypertension    Tobacco abuse     Past Surgical History:  Procedure Laterality Date   CARDIAC CATHETERIZATION  03/04/2014   PCI of mid LAD   CARDIAC CATHETERIZATION N/A 11/12/2015   Procedure: Left Heart Cath and Coronary Angiography;  Surgeon: Ozell Fell, MD;  Location: [facility] INVASIVE CV LAB;  Service: Cardiovascular;  Laterality: N/A;   LEFT HEART CATHETERIZATION WITH CORONARY ANGIOGRAM Bilateral 03/04/2014   Procedure: LEFT HEART CATHETERIZATION WITH CORONARY ANGIOGRAM;  Surgeon: Lonni JONETTA Cash, MD;  Location: [facility] CATH LAB;  Service: Cardiovascular;  Laterality: Bilateral;   LEFT HEART CATHETERIZATION WITH CORONARY ANGIOGRAM N/A 03/26/2014   Procedure: LEFT HEART CATHETERIZATION WITH CORONARY ANGIOGRAM;  Surgeon: Debby DELENA Sor, MD;  Location: [facility] CATH LAB;  Service: Cardiovascular;  Laterality: N/A;      Physical Exam   Vitals:   04/07/23 1431 04/07/23 1433  BP:  (!) 159/78  Pulse:  66  Temp:  98.3 F (36.8 C)  TempSrc:  Oral  SpO2:  99%  Weight: 70.3 kg   Height: 5' 7 (1.702 m)     Physical Exam Gen: NAD. Appears comfortable HENT: Conjunctiva clear, PERRL, EOMI. MMM.  CV: RRR. No M/R/G Pulm: Lungs CTAB with no wheezing, rales, or rhonchi.  GI: Abdomen soft, non-tender, non-distended. Normal bowel sounds in all 4 quadrants. MSK/Skin: No lower extremity edema. Extremities warm, well-perfused with 2+ pulses in all 4 extremities. Obvious deformity to the left ankle with tenderness to palpation most significant over the lateral malleolus.  Able to wiggle toes.  Sensation intact to light touch throughout.  2+ DP pulse Neuro: A&Ox3. GCS 15. Moves all extremities.     Procedures   If procedures were preformed on this Darren [name], they are listed below:  .Reduction of dislocation  Date/Time: 04/08/2023 10:57 AM  Performed by: Larna Raring, MD Authorized by: Patt Alm Macho, MD  Consent: Verbal  consent obtained. Written consent obtained. Risks and benefits: risks, benefits and alternatives were discussed Consent given by: Darren [name] Darren [name] understanding: Darren [name] states understanding of the procedure being performed Darren [name] consent: the Darren [name]'s understanding of the procedure matches consent given Imaging studies: imaging studies available Darren [name] identity confirmed: verbally with Darren [name], hospital-assigned identification number and arm band Time out: Immediately prior to procedure a time out was called to verify the correct Darren [name], procedure, equipment, support staff and site/side marked as required. Local anesthesia used: no  Anesthesia: Local anesthesia used: no  Sedation: Darren [name] sedated: yes Sedation type: moderate (conscious) sedation Sedatives: propofol  Sedation start date/time: 04/07/2023 4:52 PM Sedation end date/time: 04/07/2023 5:10 PM Vitals: Vital signs were monitored during sedation.  Darren [name] tolerance: Darren [name] tolerated the procedure well with no immediate complications      Raring Larna, MD Emergency Medicine PGY-2   Please note that this documentation was produced with the assistance of voice-to-text technology and may contain errors.    Larna Raring, MD 04/08/23 1103    Patt Alm Macho, MD 04/10/23 [phone]

## 2023-04-07 NOTE — ED Triage Notes (Addendum)
 Pt BIB EMS from roller rink, said he stepped onto skating rink and his feet went out from under him. Swelling in left tib fib and c/o pain in left ankle. Given 100mcg Fentanyl  with EMS. Aox4.

## 2023-04-07 NOTE — Progress Notes (Signed)
 Orthopedic Tech Progress Note Patient Details:  Darren [name] [birth year] [id number]  Ortho Devices Type of Ortho Device: Crutches Ortho Device/Splint Location: LLE Ortho Device/Splint Interventions: Ordered   Post Interventions Patient Tolerated: Well Instructions Provided: Poper ambulation with device  Darren [name] 04/07/2023, 6:16 PM

## 2023-04-07 NOTE — Sedation Documentation (Signed)
 Care handed off post procedure to [place], [place]

## 2023-04-07 NOTE — ED Notes (Signed)
 Patient returned from CT

## 2023-04-07 NOTE — Progress Notes (Signed)
 Orthopedic Tech Progress Note Patient Details:  Darren [name] 03-13-1970 [id number]  Ortho Devices Type of Ortho Device: Short leg splint, Stirrup splint Ortho Device/Splint Location: LLE Ortho Device/Splint Interventions: Application   Post Interventions Patient Tolerated: Well Instructions Provided: Care of device  Darren [name] 04/07/2023, 5:12 PM

## 2023-04-10 NOTE — ED Provider Notes (Signed)
 Physical Exam  BP (!) 141/74   Pulse 74   Temp 98.6 F (37 C) (Oral)   Resp 14   Ht 5' 7 (1.702 m)   Wt 70.3 kg   SpO2 100%   BMI 24.28 kg/m   Physical Exam  Procedures  .Sedation  Date/Time: 04/10/2023 4:42 PM  Performed by: Patt Alm Macho, MD Authorized by: Patt Alm Macho, MD   Consent:    Consent obtained:  Verbal   Consent given by:  Patient   Risks discussed:  Dysrhythmia, inadequate sedation, nausea, prolonged hypoxia resulting in organ damage, prolonged sedation necessitating reversal, respiratory compromise necessitating ventilatory assistance and intubation, vomiting and allergic reaction   Alternatives discussed:  Analgesia without sedation, anxiolysis and regional anesthesia Universal protocol:    Procedure explained and questions answered to patient or proxy's satisfaction: yes     Relevant documents present and verified: yes     Test results available: yes     Imaging studies available: yes     Required blood products, implants, devices, and special equipment available: yes     Site/side marked: yes     Immediately prior to procedure, a time out was called: yes     Patient identity confirmed:  Verbally with patient Indications:    Procedure necessitating sedation performed by:  Physician performing sedation Pre-sedation assessment:    Time since last food or drink:  5 hours   ASA classification: class 1 - normal, healthy patient     Mouth opening:  3 or more finger widths   Thyromental distance:  4 finger widths   Mallampati score:  I - soft palate, uvula, fauces, pillars visible   Neck mobility: normal     Pre-sedation assessments completed and reviewed: pre-procedure airway patency not reviewed and pre-procedure mental status not reviewed   A pre-sedation assessment was completed prior to the start of the procedure Immediate pre-procedure details:    Reassessment: Patient reassessed immediately prior to procedure     Reviewed: vital signs,  relevant labs/tests and NPO status     Verified: bag valve mask available, emergency equipment available, intubation equipment available, IV patency confirmed, oxygen available and suction available   Procedure details (see MAR for exact dosages):    Preoxygenation:  Nasal cannula   Sedation:  Propofol    Intended level of sedation: deep   Intra-procedure monitoring:  Blood pressure monitoring, cardiac monitor, continuous pulse oximetry, frequent LOC assessments, frequent vital sign checks and continuous capnometry   Intra-procedure events: none     Total Provider sedation time (minutes):  30 Post-procedure details:   A post-sedation assessment was completed following the completion of the procedure.   Attendance: Constant attendance by certified staff until patient recovered     Recovery: Patient returned to pre-procedure baseline     Post-sedation assessments completed and reviewed: airway patency, cardiovascular function, hydration status, mental status, nausea/vomiting, pain level, respiratory function and temperature     Patient is stable for discharge or admission: yes     Procedure completion:  Tolerated well, no immediate complications   ED Course / MDM    Medical Decision Making MIECZYSLAW STAMAS is a 53 y.o. male here presenting with left ankle fracture dislocation.  He states that he was skating with his daughter and twisted his ankle and has obvious fracture dislocation on exam.  X-ray showed bimalleolar fracture.  Conscious sedation was performed by me and I supervised the resident's reduction.  Postreduction x-ray confirmed successful reduction.  Discussed case  with orthopedic doctor who requests CT scan prior to discharge.  Patient will need to follow-up in the office next week with Ortho to schedule for surgery   I saw and evaluated the patient, reviewed the resident's note and I agree with the findings and plan.       Amount and/or Complexity of Data Reviewed Radiology:  ordered.  Risk Prescription drug management.          Patt Alm Macho, MD 04/10/23 [phone]

## 2023-04-11 ENCOUNTER — Telehealth: Payer: Self-pay | Admitting: Cardiology

## 2023-04-11 NOTE — Telephone Encounter (Signed)
   Pre-operative Risk Assessment    Patient Name: Darren [name]  DOB: [birth year] MRN: [id number]   Date of last office visit: 02/03/2020 Date of next office visit: NONE   Request for Surgical Clearance    Procedure:   Left ankle ORIF  Date of Surgery:  Clearance TBD                                Surgeon:  Dr. Duwayne Heck Surgeon's Group or Practice Name:  [facility] Phone number:  [phone] Fax number:  [phone] [name]   Type of Clearance Requested:   - Medical  - Pharmacy:  Hold Aspirin No mention of aspirin on clearance request   Type of Anesthesia:  Not Indicated   Additional requests/questions:    [name]   04/11/2023, 1:45 PM

## 2023-04-11 NOTE — Telephone Encounter (Signed)
New message  Patient dropped off pre-op form from [facility].  Form faxed to pre-op team at [phone].

## 2023-04-11 NOTE — Telephone Encounter (Signed)
   Name: Darren [name]  DOB: 1970-06-25  MRN: [id number]  Primary Cardiologist: None  Chart reviewed as part of pre-operative protocol coverage. Because of [name]'s past medical history and time since last visit, he will require a follow-up in-office visit in order to better assess preoperative cardiovascular risk.  Patient last seen in 2021 by Dr. Mayford Knife and will need to reestablish care.  Pre-op covering staff: - Please schedule appointment and call patient to inform them. If patient already had an upcoming appointment within acceptable timeframe, please add "pre-op clearance" to the appointment notes so provider is aware. - Please contact requesting surgeon's office via preferred method (i.e, phone, fax) to inform them of need for appointment prior to surgery.    Napoleon Form, Leodis Rains, NP  04/11/2023, 1:50 PM

## 2023-04-12 NOTE — Telephone Encounter (Signed)
Patient needs a in person visit for preop clearance has not been seen since Dec 2021 needs a new patient appt message has been sent to scheduling staff to reach out to patient and patient has been made aware

## 2023-04-12 NOTE — Telephone Encounter (Signed)
Patient needs to be scheduled for preop clearance hasn't been seen since Dec 2021 needs a new patient appt patient has been made aware to be looking forward to our scheduling staff will contact him to schedule new patient appt and message has been sent to scheduling staff

## 2023-04-16 ENCOUNTER — Other Ambulatory Visit: Payer: Self-pay

## 2023-04-16 DIAGNOSIS — F419 Anxiety disorder, unspecified: Secondary | ICD-10-CM | POA: Insufficient documentation

## 2023-04-16 DIAGNOSIS — Z01810 Encounter for preprocedural cardiovascular examination: Secondary | ICD-10-CM | POA: Insufficient documentation

## 2023-04-16 NOTE — Telephone Encounter (Signed)
 Pt new pt appt 04/19/23 with Dr. Tomie China. I will update all parties involved.

## 2023-04-16 NOTE — Telephone Encounter (Signed)
 I will send a message to our scheduling team pt need new pt appt to re-est; preop clearance needed.

## 2023-04-19 ENCOUNTER — Ambulatory Visit: Payer: 59 | Attending: Cardiology | Admitting: Cardiology

## 2023-04-19 ENCOUNTER — Encounter (HOSPITAL_COMMUNITY): Payer: Self-pay

## 2023-04-19 ENCOUNTER — Encounter: Payer: Self-pay | Admitting: Cardiology

## 2023-04-19 ENCOUNTER — Ambulatory Visit: Payer: 59 | Attending: Cardiology

## 2023-04-19 VITALS — BP 186/82 | HR 70 | Ht 67.0 in | Wt 167.8 lb

## 2023-04-19 DIAGNOSIS — I10 Essential (primary) hypertension: Secondary | ICD-10-CM

## 2023-04-19 DIAGNOSIS — Z01810 Encounter for preprocedural cardiovascular examination: Secondary | ICD-10-CM

## 2023-04-19 DIAGNOSIS — Z955 Presence of coronary angioplasty implant and graft: Secondary | ICD-10-CM | POA: Insufficient documentation

## 2023-04-19 DIAGNOSIS — I351 Nonrheumatic aortic (valve) insufficiency: Secondary | ICD-10-CM

## 2023-04-19 LAB — ECHOCARDIOGRAM COMPLETE
Height: 67 in
P 1/2 time: 488 ms
S' Lateral: 3.1 cm
Weight: 2684.8 [oz_av]

## 2023-04-19 MED ORDER — AMLODIPINE BESYLATE 5 MG PO TABS: 5.0000 mg | ORAL_TABLET | Freq: Every day | ORAL | 3 refills | Status: DC

## 2023-04-19 MED ORDER — HYDROCHLOROTHIAZIDE 25 MG PO TABS: 25.0000 mg | ORAL_TABLET | Freq: Every day | ORAL | 3 refills | Status: DC

## 2023-04-19 NOTE — Patient Instructions (Signed)
 Medication Instructions:  Your physician recommends that you continue on your current medications as directed. Please refer to the Current Medication list given to you today.   *If you need a refill on your cardiac medications before your next appointment, please call your pharmacy*   Lab Work: none If you have labs (blood work) drawn today and your tests are completely normal, you will receive your results only by: MyChart Message (if you have MyChart) OR A paper copy in the mail If you have any lab test that is abnormal or we need to change your treatment, we will call you to review the results.   Testing/Procedures:   [facility] at [facility] [name] [place], [place] [address] Phone: [phone]   Please arrive 15 minutes prior to your appointment time for registration and insurance purposes.  The test will take approximately 3 to 4 hours to complete; you may bring reading material.  If someone comes with you to your appointment, they will need to remain in the main lobby due to limited space in the testing area. **If you are pregnant or breastfeeding, please notify the nuclear lab prior to your appointment**  How to prepare for your Myocardial Perfusion Test: Do not eat or drink 3 hours prior to your test, except you may have water. Do not consume products containing caffeine (regular or decaffeinated) 12 hours prior to your test. (ex: coffee, chocolate, sodas, tea). Do bring a list of your current medications with you.  If not listed below, you may take your medications as normal. Do wear comfortable clothes (no dresses or overalls) and walking shoes, tennis shoes preferred (No heels or open toe shoes are allowed). Do NOT wear cologne, perfume, aftershave, or lotions (deodorant is allowed). If these instructions are not followed, your test will have to be rescheduled.  If you cannot keep your appointment, please provide 24 hours notification to the  Nuclear Lab, to avoid a possible $50 charge to your account.     Your physician has requested that you have an echocardiogram. Echocardiography is a painless test that uses sound waves to create images of your heart. It provides your doctor with information about the size and shape of your heart and how well your heart's chambers and valves are working. This procedure takes approximately one hour. There are no restrictions for this procedure. Please do NOT wear cologne, perfume, aftershave, or lotions (deodorant is allowed). Please arrive 15 minutes prior to your appointment time.  Please note: We ask at that you not bring children with you during ultrasound (echo/ vascular) testing. Due to room size and safety concerns, children are not allowed in the ultrasound rooms during exams. Our front office staff cannot provide observation of children in our lobby area while testing is being conducted. An adult accompanying a patient to their appointment will only be allowed in the ultrasound room at the discretion of the ultrasound technician under special circumstances. We apologize for any inconvenience.    Follow-Up: At [facility], you and your health needs are our priority.  As part of our continuing mission to provide you with exceptional heart care, we have created designated Provider Care Teams.  These Care Teams include your primary Cardiologist (physician) and Advanced Practice Providers (APPs -  Physician Assistants and Nurse Practitioners) who all work together to provide you with the care you need, when you need it.  We recommend signing up for the patient portal called "MyChart".  Sign up information  is provided on this After Visit Summary.  MyChart is used to connect with patients for Virtual Visits (Telemedicine).  Patients are able to view lab/test results, encounter notes, upcoming appointments, etc.  Non-urgent messages can be sent to your provider as well.   To learn more about  what you can do with MyChart, go to [email or web address].    Your next appointment:   6 month(s)  Provider:   Belva Crome, MD   Other Instructions  Cardiac Nuclear Scan A cardiac nuclear scan is a test that is done to check the flow of blood to your heart. It is done when you are resting and when you are exercising. The test looks for problems such as: Not enough blood reaching a portion of the heart. The heart muscle not working as it should. You may need this test if you have: Heart disease. Lab results that are not normal. Had heart surgery or a balloon procedure to open up blocked arteries (angioplasty) or a small mesh tube (stent). Chest pain. Shortness of breath. Had a heart attack. In this test, a special dye (tracer) is put into your bloodstream. The tracer will travel to your heart. A camera will then take pictures of your heart to see how the tracer moves through your heart. This test is usually done at a hospital and takes 2-4 hours. Tell a doctor about: Any allergies you have. All medicines you are taking, including vitamins, herbs, eye drops, creams, and over-the-counter medicines. Any bleeding problems you have. Any surgeries you have had. Any medical conditions you have. Whether you are pregnant or may be pregnant. Any history of asthma or long-term (chronic) lung disease. Any history of heart rhythm disorders or heart valve conditions. What are the risks? Your doctor will talk with you about risks. These may include: Serious chest pain and heart attack. This is only a risk if the stress portion of the test is done. Fast or uneven heartbeats (palpitations). A feeling of warmth in your chest. This feeling usually does not last long. Allergic reaction to the tracer. Shortness of breath or trouble breathing. What happens before the test? Ask your doctor about changing or stopping your normal medicines. Follow instructions from your doctor about what you  cannot eat or drink. Remove your jewelry on the day of the test. Ask your doctor if you need to avoid nicotine or caffeine. What happens during the test? An IV tube will be inserted into one of your veins. Your doctor will give you a small amount of tracer through the IV tube. You will wait for 20-40 minutes while the tracer moves through your bloodstream. Your heart will be monitored with an electrocardiogram (ECG). You will lie down on an exam table. Pictures of your heart will be taken for about 15-20 minutes. You may also have a stress test. For this test, one of these things may be done: You will be asked to exercise on a treadmill or a stationary bike. You will be given medicines that will make your heart work harder. This is done if you are unable to exercise. When blood flow to your heart has peaked, a tracer will again be given through the IV tube. After 20-40 minutes, you will get back on the exam table. More pictures will be taken of your heart. Depending on the tracer that is used, more pictures may need to be taken 3-4 hours later. Your IV tube will be removed when the test is over. The test may vary  among doctors and hospitals. What happens after the test? Ask your doctor: Whether you can return to your normal schedule, including diet, activities, travel, and medicines. Whether you should drink more fluids. This will help to remove the tracer from your body. Ask your doctor, or the department that is doing the test: When will my results be ready? How will I get my results? What are my treatment options? What other tests do I need? What are my next steps? This information is not intended to replace advice given to you by your health care provider. Make sure you discuss any questions you have with your health care provider. Document Revised: 07/12/2021 Document Reviewed: 07/12/2021 Elsevier Patient Education  2023 Elsevier Inc.  Echocardiogram An echocardiogram is a test  that uses sound waves (ultrasound) to produce images of the heart. Images from an echocardiogram can provide important information about: Heart size and shape. The size and thickness and movement of your heart's walls. Heart muscle function and strength. Heart valve function or if you have stenosis. Stenosis is when the heart valves are too narrow. If blood is flowing backward through the heart valves (regurgitation). A tumor or infectious growth around the heart valves. Areas of heart muscle that are not working well because of poor blood flow or injury from a heart attack. Aneurysm detection. An aneurysm is a weak or damaged part of an artery wall. The wall bulges out from the normal force of blood pumping through the body. Tell a health care provider about: Any allergies you have. All medicines you are taking, including vitamins, herbs, eye drops, creams, and over-the-counter medicines. Any blood disorders you have. Any surgeries you have had. Any medical conditions you have. Whether you are pregnant or may be pregnant. What are the risks? Generally, this is a safe test. However, problems may occur, including an allergic reaction to dye (contrast) that may be used during the test. What happens before the test? No specific preparation is needed. You may eat and drink normally. What happens during the test?  You will take off your clothes from the waist up and put on a hospital gown. Electrodes or electrocardiogram (ECG)patches may be placed on your chest. The electrodes or patches are then connected to a device that monitors your heart rate and rhythm. You will lie down on a table for an ultrasound exam. A gel will be applied to your chest to help sound waves pass through your skin. A handheld device, called a transducer, will be pressed against your chest and moved over your heart. The transducer produces sound waves that travel to your heart and bounce back (or "echo" back) to the  transducer. These sound waves will be captured in real-time and changed into images of your heart that can be viewed on a video monitor. The images will be recorded on a computer and reviewed by your health care provider. You may be asked to change positions or hold your breath for a short time. This makes it easier to get different views or better views of your heart. In some cases, you may receive contrast through an IV in one of your veins. This can improve the quality of the pictures from your heart. The procedure may vary among health care providers and hospitals. What can I expect after the test? You may return to your normal, everyday life, including diet, activities, and medicines, unless your health care provider tells you not to do that. Follow these instructions at home: It is up  to you to get the results of your test. Ask your health care provider, or the department that is doing the test, when your results will be ready. Keep all follow-up visits. This is important. Summary An echocardiogram is a test that uses sound waves (ultrasound) to produce images of the heart. Images from an echocardiogram can provide important information about the size and shape of your heart, heart muscle function, heart valve function, and other possible heart problems. You do not need to do anything to prepare before this test. You may eat and drink normally. After the echocardiogram is completed, you may return to your normal, everyday life, unless your health care provider tells you not to do that. This information is not intended to replace advice given to you by your health care provider. Make sure you discuss any questions you have with your health care provider. Document Revised: 10/27/2020 Document Reviewed: 10/07/2019 Elsevier Patient Education  2023 Elsevier Inc.    Important Information About Sugar

## 2023-04-19 NOTE — Progress Notes (Signed)
 Cardiology Office Note:    Date:  04/19/2023   ID:  Darren [name], DOB 07-Feb-1971, MRN [id number]  PCP:  Rema Fendt, NP  Cardiologist:  Garwin Brothers, MD   Referring MD: Yolonda Kida, *    ASSESSMENT:    1. Preop cardiovascular exam   2. Aortic valve insufficiency, etiology of cardiac valve disease unspecified   3. Essential hypertension   4. History of coronary artery stent placement    PLAN:    In order of problems listed above:  Preop cardiovascular evaluation: Coronary artery disease and moderate to severe aortic insufficiency: I discussed my findings with the patient extensively.  I will do a Lexiscan sestamibi to assess him.  He has history of coronary artery disease has not been compliant with medications.  He is on no blood pressure medications at this time.  He is not taking any lipid-lowering medications I cautioned him about this.  Also I would like to know the extent of his aortic's insufficiency.  If it is significant then I might recommend that he have an inpatient surgery to make sure that he is hemodynamically makes her very well optimized. Essential hypertension he will resume blood pressure medications keep a track of his pulse blood pressure will take it from there.  Diet emphasized compliance emphasized Mixed dyslipidemia: He has to be on lipid-lowering medications.  His lipids are elevated.  This will followed by primary care.  Diet emphasized. Further recommendations will be made based on the findings of the aforementioned test.Patient will be seen in follow-up appointment in 6 months or earlier if the patient has any concerns.    Medication Adjustments/Labs and Tests Ordered: Current medicines are reviewed at length with the patient today.  Concerns regarding medicines are outlined above.  Orders Placed This Encounter  Procedures   MYOCARDIAL PERFUSION IMAGING   EKG 12-Lead   ECHOCARDIOGRAM COMPLETE   Meds ordered this encounter   Medications   amLODipine (NORVASC) 5 MG tablet    Sig: Take 1 tablet (5 mg total) by mouth daily.    Dispense:  90 tablet    Refill:  3   hydrochlorothiazide (HYDRODIURIL) 25 MG tablet    Sig: Take 1 tablet (25 mg total) by mouth daily.    Dispense:  90 tablet    Refill:  3     History of Present Illness:    Darren [name] is a 53 y.o. male who is being seen today for the evaluation of preop cardiovascular evaluation.  At the request of Yolonda Kida, *.  Patient has past medical history of coronary artery disease post stenting, essential hypertension and is not taking any medications for the past several months prediabetes, dyslipidemia, moderate to severe aortic insufficiency.  He has history of tobacco and alcohol abuse but tells me that he quit a long time ago.  At the time of my evaluation, the patient is alert awake oriented and in no distress.  He has had a fall when he was trying to do rollerblading skating.  He fractured his left ankle and needs surgery.  Surgery appears to be significant.  Past Medical History:  Diagnosis Date   Angina pectoris (HCC) 11/11/2015   Anxiety    Aortic insufficiency    moderate to severe by echo 02/2020   Chest pain in adult 02/01/2017   Coronary artery disease 03/24/2014   NSTEMI s/p PCI of mid LAD and repeat cath with patent stent   Coronary artery disease  due to lipid rich plaque    Coronary artery disease involving native heart 02/01/2017   Overview:   S/p PCI 02/2014     Formatting of this note might be different from the original.  S/p PCI 02/2014     Depression    Dyslipidemia 02/01/2017   Essential hypertension 03/04/2014   Hypertension    Major depressive disorder, recurrent episode, moderate (HCC) 09/01/2017   MDD (major depressive disorder) 08/06/2014   Prediabetes 06/28/2021   Preop cardiovascular exam    Pulmonary emphysema (HCC) 03/24/2016    Past Surgical History:  Procedure Laterality Date   CARDIAC  CATHETERIZATION  03/04/2014   PCI of mid LAD   CARDIAC CATHETERIZATION N/A 11/12/2015   Procedure: Left Heart Cath and Coronary Angiography;  Surgeon: Tonny Bollman, MD;  Location: [facility] INVASIVE CV LAB;  Service: Cardiovascular;  Laterality: N/A;   LEFT HEART CATHETERIZATION WITH CORONARY ANGIOGRAM Bilateral 03/04/2014   Procedure: LEFT HEART CATHETERIZATION WITH CORONARY ANGIOGRAM;  Surgeon: Kathleene Hazel, MD;  Location: [facility] CATH LAB;  Service: Cardiovascular;  Laterality: Bilateral;   LEFT HEART CATHETERIZATION WITH CORONARY ANGIOGRAM N/A 03/26/2014   Procedure: LEFT HEART CATHETERIZATION WITH CORONARY ANGIOGRAM;  Surgeon: Lennette Bihari, MD;  Location: [facility] CATH LAB;  Service: Cardiovascular;  Laterality: N/A;    Current Medications: Current Meds  Medication Sig   albuterol (VENTOLIN HFA) 108 (90 Base) MCG/ACT inhaler Inhale 2 puffs into the lungs every 6 (six) hours as needed for wheezing or shortness of breath.   aspirin EC 81 MG tablet Take 81 mg by mouth.   mirtazapine (REMERON) 15 MG tablet Take 15 mg by mouth at bedtime.   nitroGLYCERIN (NITROSTAT) 0.4 MG SL tablet Place 1 tablet (0.4 mg total) under the tongue every 5 (five) minutes as needed for chest pain.   sertraline (ZOLOFT) 100 MG tablet Take 200 mg by mouth daily.   [DISCONTINUED] amLODipine (NORVASC) 5 MG tablet Take 5 mg by mouth daily.   [DISCONTINUED] hydrochlorothiazide (HYDRODIURIL) 25 MG tablet Take 25 mg by mouth daily.     Allergies:   Hydrocodone   Social History   Socioeconomic History   Marital status: Divorced    Spouse name: Not on file   Number of children: Not on file   Years of education: Not on file   Highest education level: Not on file  Occupational History   Not on file  Tobacco Use   Smoking status: Former    Current packs/day: 0.00    Average packs/day: 0.5 packs/day for 30.0 years (15.0 ttl pk-yrs)    Types: Cigarettes    Start date: 12/28/1985    Quit date: 12/29/2015    Years since  quitting: 7.3    Passive exposure: Past   Smokeless tobacco: Never  Vaping Use   Vaping status: Former  Substance and Sexual Activity   Alcohol use: Yes    Alcohol/week: 2.0 standard drinks of alcohol    Types: 2 Standard drinks or equivalent per week    Comment: socially   Drug use: Yes    Types: Marijuana    Comment: daily use   Sexual activity: Not Currently    Birth control/protection: Condom  Other Topics Concern   Not on file  Social History Narrative   Not on file   Social Drivers of Health   Financial Resource Strain: Not on file  Food Insecurity: Not on file  Transportation Needs: Not on file  Physical Activity: Not on file  Stress: Not on  file  Social Connections: Unknown (07/12/2021)   Received from [facility]   Social Network    Social Network: Not on file     Family History: The patient's family history includes Alcohol abuse in his father; Diabetes in an other family member; Heart attack in an other family member; Hypertension in his father, mother, and another family member.  ROS:   Please see the history of present illness.    All other systems reviewed and are negative.  EKGs/Labs/Other Studies Reviewed:    The following studies were reviewed today:  EKG Interpretation Date/Time:  Thursday April 19 2023 10:35:37 EST Ventricular Rate:  70 PR Interval:  184 QRS Duration:  98 QT Interval:  386 QTC Calculation: 416 R Axis:   41  Text Interpretation: Normal sinus rhythm Left ventricular hypertrophy ST & T wave abnormality, consider inferior ischemia Abnormal ECG When compared with ECG of 11-Nov-2015 15:19, Nonspecific T wave abnormality has replaced inverted T waves in Lateral leads Confirmed by Belva Crome [phone]) on 04/19/2023 10:46:33 AM     Recent Labs: 04/20/2022: BUN 12; Creatinine, Ser 0.96; Potassium 4.6; Sodium 141  Recent Lipid Panel    Component Value Date/Time   CHOL 228 (H) 04/20/2022 0921   TRIG 119  04/20/2022 0921   HDL 54 04/20/2022 0921   CHOLHDL 4.2 04/20/2022 0921   CHOLHDL 3.0 11/12/2015 0942   VLDL 11 11/12/2015 0942   LDLCALC 153 (H) 04/20/2022 0921    Physical Exam:    VS:  BP (!) 160/80   Pulse 70   Ht 5\' 7"  (1.702 m)   Wt 167 lb 12.8 oz (76.1 kg)   SpO2 98%   BMI 26.28 kg/m     Wt Readings from Last 3 Encounters:  04/19/23 167 lb 12.8 oz (76.1 kg)  04/07/23 155 lb (70.3 kg)  04/20/22 160 lb (72.6 kg)     GEN: Patient is in no acute distress HEENT: Normal NECK: No JVD; No carotid bruits LYMPHATICS: No lymphadenopathy CARDIAC: S1 S2 regular, 2/6 systolic murmur at the apex. RESPIRATORY:  Clear to auscultation without rales, wheezing or rhonchi  ABDOMEN: Soft, non-tender, non-distended MUSCULOSKELETAL:  No edema; No deformity  SKIN: Warm and dry NEUROLOGIC:  Alert and oriented x 3 PSYCHIATRIC:  Normal affect    Signed, Garwin Brothers, MD  04/19/2023 11:06 AM     [facility]

## 2023-04-20 ENCOUNTER — Ambulatory Visit (HOSPITAL_BASED_OUTPATIENT_CLINIC_OR_DEPARTMENT_OTHER): Payer: 59

## 2023-04-20 ENCOUNTER — Emergency Department (HOSPITAL_COMMUNITY): Payer: 59

## 2023-04-20 ENCOUNTER — Other Ambulatory Visit: Payer: Self-pay

## 2023-04-20 ENCOUNTER — Emergency Department (HOSPITAL_COMMUNITY)
Admission: EM | Admit: 2023-04-20 | Discharge: 2023-04-20 | Disposition: A | Discharge status: Home / Self Care | Payer: 59 | Attending: Emergency Medicine | Admitting: Emergency Medicine

## 2023-04-20 DIAGNOSIS — Z01810 Encounter for preprocedural cardiovascular examination: Secondary | ICD-10-CM | POA: Insufficient documentation

## 2023-04-20 DIAGNOSIS — S99912D Unspecified injury of left ankle, subsequent encounter: Secondary | ICD-10-CM | POA: Diagnosis present

## 2023-04-20 DIAGNOSIS — X58XXXD Exposure to other specified factors, subsequent encounter: Secondary | ICD-10-CM | POA: Insufficient documentation

## 2023-04-20 DIAGNOSIS — I10 Essential (primary) hypertension: Secondary | ICD-10-CM | POA: Insufficient documentation

## 2023-04-20 DIAGNOSIS — S82892D Other fracture of left lower leg, subsequent encounter for closed fracture with routine healing: Secondary | ICD-10-CM

## 2023-04-20 DIAGNOSIS — I2510 Atherosclerotic heart disease of native coronary artery without angina pectoris: Secondary | ICD-10-CM | POA: Insufficient documentation

## 2023-04-20 DIAGNOSIS — S82832D Other fracture of upper and lower end of left fibula, subsequent encounter for closed fracture with routine healing: Secondary | ICD-10-CM | POA: Insufficient documentation

## 2023-04-20 DIAGNOSIS — Z79899 Other long term (current) drug therapy: Secondary | ICD-10-CM | POA: Insufficient documentation

## 2023-04-20 LAB — MYOCARDIAL PERFUSION IMAGING
Base ST Depression (mm): 0 mm
LV dias vol: 135 mL (ref 62–150)
LV sys vol: 58 mL
Nuc Stress EF: 57 %
Peak HR: 103 {beats}/min
Rest HR: 63 {beats}/min
Rest Nuclear Isotope Dose: 10.5 mCi
SDS: 1
SRS: 0
SSS: 1
ST Depression (mm): 0 mm
Stress Nuclear Isotope Dose: 32.6 mCi
TID: 0.96

## 2023-04-20 MED ORDER — TECHNETIUM TC 99M TETROFOSMIN IV KIT
32.6000 | PACK | Freq: Once | INTRAVENOUS | Status: AC | PRN
  Administered 2023-04-20: 32.6 via INTRAVENOUS

## 2023-04-20 MED ORDER — TECHNETIUM TC 99M TETROFOSMIN IV KIT
10.5000 | PACK | Freq: Once | INTRAVENOUS | Status: AC | PRN
Start: 2023-04-20 — End: 2023-04-20
  Administered 2023-04-20: 10.5 via INTRAVENOUS

## 2023-04-20 MED ORDER — REGADENOSON 0.4 MG/5ML IV SOLN
0.4000 mg | Freq: Once | INTRAVENOUS | Status: AC
  Administered 2023-04-20: 0.4 mg via INTRAVENOUS

## 2023-04-20 NOTE — Discharge Instructions (Addendum)
 It was a pleasure caring for you today.  Please follow-up with your primary care provider and orthopedic provider.  Seek emergency care if experiencing any new or worsening symptoms.

## 2023-04-20 NOTE — ED Provider Notes (Signed)
 [place] EMERGENCY DEPARTMENT AT [facility] Provider Note   CSN: [id number] Arrival date & time: 04/20/23  1635     History  Chief Complaint  Patient presents with   Foot Injury    Darren [name] is a 53 y.o. male with PMHx MDD, CAD, HLD, HTN, anxiety, left distal fibula/posterior malleolus fracture on 04/07/2023 following with Orthopedic Dr. Aundria Rud outpatient who presents to ED concerned for left ankle pain. Patient stating that he barely bumped his left foot on the ground last night, and he feels like something is pressing hard against the medial surface of his ankle/lower leg. Splint may have became misplaced.  Foot Injury      Home Medications Prior to Admission medications   Medication Sig Start Date End Date Taking? Authorizing Provider  albuterol (VENTOLIN HFA) 108 (90 Base) MCG/ACT inhaler Inhale 2 puffs into the lungs every 6 (six) hours as needed for wheezing or shortness of breath. 12/25/18   Doristine Bosworth, MD  amLODipine (NORVASC) 5 MG tablet Take 1 tablet (5 mg total) by mouth daily. 04/19/23   Revankar, Aundra Dubin, MD  aspirin EC 81 MG tablet Take 81 mg by mouth.    [provider]  hydrochlorothiazide (HYDRODIURIL) 25 MG tablet Take 1 tablet (25 mg total) by mouth daily. 04/19/23   Revankar, Aundra Dubin, MD  mirtazapine (REMERON) 15 MG tablet Take 15 mg by mouth at bedtime.    [provider]  nitroGLYCERIN (NITROSTAT) 0.4 MG SL tablet Place 1 tablet (0.4 mg total) under the tongue every 5 (five) minutes as needed for chest pain. 09/01/17   Doristine Bosworth, MD  sertraline (ZOLOFT) 100 MG tablet Take 200 mg by mouth daily.    [provider]      Allergies    Hydrocodone    Review of Systems   Review of Systems  Musculoskeletal:        Ankle pain    Physical Exam Updated Vital Signs BP (!) 156/74 (BP Location: Right Arm)   Pulse 74   Temp 98.9 F (37.2 C) (Oral)   Resp 16   SpO2 100%  Physical Exam Vitals and nursing  note reviewed.  Constitutional:      General: He is not in acute distress.    Appearance: He is not ill-appearing or toxic-appearing.  HENT:     Head: Normocephalic and atraumatic.  Eyes:     General: No scleral icterus.       Right eye: No discharge.        Left eye: No discharge.     Conjunctiva/sclera: Conjunctivae normal.  Cardiovascular:     Rate and Rhythm: Normal rate.  Pulmonary:     Effort: Pulmonary effort is normal.  Abdominal:     General: Abdomen is flat.  Musculoskeletal:     Comments: Splint appears appropriately positioned on left ankle. Brisk capillary refill.  Full ROM of toes.  Sensation light touch intact.  Area nontense.  Skin:    General: Skin is warm and dry.  Neurological:     General: No focal deficit present.     Mental Status: He is alert. Mental status is at baseline.  Psychiatric:        Mood and Affect: Mood normal.        Behavior: Behavior normal.     ED Results / Procedures / Treatments   Labs (all labs ordered are listed, but only abnormal results are displayed) Labs Reviewed - No data  to display  EKG None  Radiology DG Ankle Complete Left Result Date: 04/20/2023 CLINICAL DATA:  History of ankle fracture status post reduction, pain EXAM: LEFT ANKLE COMPLETE - 3+ VIEW COMPARISON:  04/07/2023 FINDINGS: Frontal, oblique, and lateral views of the left ankle are obtained, with casting material in place. The oblique distal fibular fracture seen previously is again identified, with no significant callus formation. There is increased widening across the distal tibiofibular syndesmosis, now measuring 6 mm per previously it had measured 5 mm. Mild lateral translation of the talus relative to the tibial plafond. Mild diffuse soft tissue swelling. IMPRESSION: 1. Oblique distal left fibular fracture with no significant callus formation. 2. Slight lateral subluxation of the talus relative to the tibial plafond, with increased widening at the distal  tibiofibular syndesmosis. 3. The posterior malleolar fracture seen on prior CT is not well visualized by x-ray. Electronically Signed   By: Sharlet Salina M.D.   On: 04/20/2023 18:47   MYOCARDIAL PERFUSION IMAGING Result Date: 04/20/2023   Baseline ECG is abnormal. ECG rhythm shows normal sinus rhythm. There is left ventricular hypertrophy with strain.   A pharmacological stress test was performed using IV Lexiscan 0.4mg  over 10 seconds performed without concurrent submaximal exercise. The patient reported dyspnea during the stress test. Normal blood pressure and normal heart rate response noted during stress. Heart rate recovery was normal.   No ST deviation was noted compared to baseline EKG. Arrhythmias during recovery: rare PVCs. ECG was uninterpretable due to baseline repolarization abnormalities. The ECG was not diagnostic due to pharmacologic protocol.   LV perfusion is normal. There is no evidence of ischemia. There is no evidence of infarction.   Left ventricular function is normal. Nuclear stress EF: 57%. The left ventricular ejection fraction is normal (55-65%). End diastolic cavity size is normal. End systolic cavity size is mildly enlarged. No evidence of transient ischemic dilation (TID) noted.   Prior study not available for comparison.   The study is normal. The study is low risk.   ECHOCARDIOGRAM COMPLETE Result Date: 04/19/2023    ECHOCARDIOGRAM REPORT   Patient Name:   Darren [name] Date of Exam: 04/19/2023 Medical Rec #:  [id number]       Height:       67.0 in Accession #:    [id number]      Weight:       167.8 lb Date of Birth:  1970-09-29       BSA:          1.877 m Patient Age:    52 years        BP:           186/82 mmHg Patient Gender: M               HR:           61 bpm. Exam Location:  [facility] Procedure: 2D Echo and Strain Analysis (Both Spectral and Color Flow Doppler            were utilized during procedure). Indications:    Preop cardiovascular exam [Z01.810], Aortic valve  insufficiency,                 etiology of cardiac valve disease unspecified [I35.1]  History:        Patient has prior history of Echocardiogram examinations, most                 recent 03/25/2020.  Sonographer:    Louie Boston RDCS  Referring Phys: Rito Ehrlich [facility] IMPRESSIONS  1. Left ventricular ejection fraction, by estimation, is 60 to 65%. The left ventricle has normal function. The left ventricle has no regional wall motion abnormalities. There is moderate left ventricular hypertrophy. Left ventricular diastolic parameters are consistent with Grade I diastolic dysfunction (impaired relaxation). The global longitudinal strain is abnormal -18,2%  2. Right ventricular systolic function is normal. The right ventricular size is normal. There is normal pulmonary artery systolic pressure.  3. The mitral valve is normal in structure. No evidence of mitral valve regurgitation. No evidence of mitral stenosis.  4. The aortic valve was not well visualized. Aortic valve regurgitation is mild to moderate. Mild aortic valve stenosis.  5. 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  6. The inferior vena cava is normal in size with greater than 50% respiratory variability, suggesting right atrial pressure of 3 mmHg. FINDINGS  Left Ventricle: Left ventricular ejection fraction, by estimation, is 60 to 65%. The left ventricle has normal function. Left ventricular endocardial border not optimally defined to evaluate regional wall motion. Strain was performed and the global longitudinal strain is abnormal. The left ventricular internal cavity size was normal in size. There is moderate left ventricular hypertrophy. Left ventricular diastolic parameters are consistent with Grade I diastolic dysfunction (impaired relaxation). Right Ventricle: The right ventricular size is normal. No increase in right ventricular wall thickness. Right ventricular systolic  function is normal. There is normal pulmonary artery systolic pressure. The tricuspid regurgitant velocity is 1.48 m/s, and  with an assumed right atrial pressure of 3 mmHg, the estimated right ventricular systolic pressure is 11.8 mmHg. Left Atrium: Left atrial size was normal in size. Right Atrium: Right atrial size was normal in size. Pericardium: There is no evidence of pericardial effusion. Mitral Valve: The mitral valve is normal in structure. No evidence of mitral valve regurgitation. No evidence of mitral valve stenosis. Tricuspid Valve: The tricuspid valve is normal in structure. Tricuspid valve regurgitation is not demonstrated. No evidence of tricuspid stenosis. Aortic Valve: The aortic valve was not well visualized. Aortic valve regurgitation is mild to moderate. Aortic regurgitation PHT measures 488 msec. Mild aortic stenosis is present. Pulmonic Valve: The pulmonic valve was normal in structure. Pulmonic valve regurgitation is not visualized. No evidence of pulmonic stenosis. Aorta: The aortic root is normal in size and structure. There is moderate dilatation of the ascending aorta, measuring 41 mm. There is an aneurysm involving the descending aorta measuring 44 mm. Venous: The inferior vena cava is normal in size with greater than 50% respiratory variability, suggesting right atrial pressure of 3 mmHg. IAS/Shunts: No atrial level shunt detected by color flow Doppler. Additional Comments: 3D imaging was not performed.  LEFT VENTRICLE PLAX 2D LVIDd:         4.65 cm   Diastology LVIDs:         3.10 cm   LV e' medial:    6.36 cm/s LV PW:         1.60 cm   LV E/e' medial:  6.5 LV IVS:        1.45 cm   LV e' lateral:   7.72 cm/s LVOT diam:     2.00 cm   LV E/e' lateral: 5.4 LV SV:         66 LV SV Index:   35 LVOT Area:     3.14 cm  RIGHT VENTRICLE  IVC RV Basal diam:  2.80 cm     IVC diam: 1.30 cm RV S prime:     13.40 cm/s LEFT ATRIUM             Index        RIGHT ATRIUM           Index LA  diam:        3.20 cm 1.70 cm/m   RA Area:     12.10 cm LA Vol (A2C):   59.2 ml 31.54 ml/m  RA Volume:   23.10 ml  12.31 ml/m LA Vol (A4C):   51.4 ml 27.39 ml/m LA Biplane Vol: 58.5 ml 31.17 ml/m  AORTIC VALVE LVOT Vmax:   96.05 cm/s LVOT Vmean:  62.250 cm/s LVOT VTI:    0.209 m AI PHT:      488 msec  AORTA Ao Root diam: 3.60 cm Ao Asc diam:  3.90 cm Ao Desc diam: 2.70 cm MV E velocity: 41.50 cm/s  TRICUSPID VALVE MV A velocity: 57.00 cm/s  TR Peak grad:   8.8 mmHg MV E/A ratio:  0.73        TR Vmax:        148.00 cm/s                             SHUNTS                            Systemic VTI:  0.21 m                            Systemic Diam: 2.00 cm Belva Crome MD Electronically signed by Belva Crome MD Signature Date/Time: 04/19/2023/5:19:24 PM    Final     Procedures Procedures    Medications Ordered in ED Medications - No data to display  ED Course/ Medical Decision Making/ A&P                                 Medical Decision Making Amount and/or Complexity of Data Reviewed Radiology: ordered.   This patient presents to the ED for concern of left ankle pain, this involves an extensive number of treatment options, and is a complaint that carries with it a high risk of complications and morbidity.  The differential diagnosis includes hemarthrosis, gout, septic joint, fracture, tendonitis, carpal tunnel syndrome, muscle strain, bursitis, compartment syndrome   Co morbidities that complicate the patient evaluation  Recent ankle fracture   Additional history obtained:  Patient with ankle fracture on 2/8.  Dr. Aundria Rud consulting orthopedic provider on-call at that time.   Problem List / ED Course / Critical interventions / Medication management  Patient presents to ED concern for left ankle pain.  Patient stating that he barely bumped his foot on the ground yesterday and feels like something is pressing up against the medial side of his left lower leg.  Physical exam  reassuring.  I was able to remove the Ace bandages, minimally readjust the underlying splint, and reapplied Ace bandages.  Patient stating that his ankle is feeling a lot better. I ordered imaging studies including left ankle x-ray. I independently visualized and interpreted imaging. I agree with the radiologist interpretation showing patient's pre-existing fractures. Shared results with patient. Recommended following up with Dr. Aundria Rud.  Patient verbalized understanding of plan. I have reviewed the patients home medicines and have made adjustments as needed Patient afebrile with stable vitals.  Provided with return precautions.  Discharged in good condition.    Social Determinants of Health:  None         Final Clinical Impression(s) / ED Diagnoses Final diagnoses:  Closed fracture of left ankle with routine healing, subsequent encounter    Rx / DC Orders ED Discharge Orders     None         Margarita Rana 04/20/23 2037    Glyn Ade, MD 04/23/23 [phone]

## 2023-04-20 NOTE — ED Triage Notes (Signed)
 Pt fractured L foot on the 8th. Reduction and splint placed. Pt c/o weird feeling- stating "it feels like my bone is rubbing" when they turn their ankle.

## 2023-04-23 ENCOUNTER — Telehealth: Payer: Self-pay

## 2023-04-23 ENCOUNTER — Telehealth: Payer: Self-pay | Admitting: Cardiology

## 2023-04-23 DIAGNOSIS — I77810 Thoracic aortic ectasia: Secondary | ICD-10-CM

## 2023-04-23 NOTE — Telephone Encounter (Signed)
 Calling to see if he is cleared for surgery.

## 2023-04-23 NOTE — Telephone Encounter (Signed)
 Dr. Tomie China  We have received a surgical clearance request for Darren [name] for left ankle ORIF. They were seen recently in clinic on 04/19/2023 and underwent a Lexiscan and echo for further evaluation.  Can you please comment on surgical clearance and if patient is an acceptable risk for upcoming procedure.  Please forward you guidance and recommendations to P CV DIV PREOP   Thank you, Robin Searing, NP

## 2023-04-23 NOTE — Telephone Encounter (Signed)
 Will send notes to preop APP to f/u if pt has been cleared as requesting office is inquiring .

## 2023-04-23 NOTE — Telephone Encounter (Signed)
-----   Message from Garwin Brothers sent at 04/19/2023  5:21 PM EST ----- Mild AS moderate AR, Needs CT chest no contrast for aortic aneurysm. Cc pcp Garwin Brothers, MD 04/19/2023 5:20 PM

## 2023-04-24 ENCOUNTER — Encounter: Payer: Self-pay | Admitting: Cardiology

## 2023-04-24 NOTE — Telephone Encounter (Signed)
Results reviewed with pt as per Dr. Revankar's note.  Pt verbalized understanding and had no additional questions. Routed to PCP.  

## 2023-04-24 NOTE — Telephone Encounter (Signed)
 Pt aware and CT ordered.

## 2023-04-24 NOTE — Addendum Note (Signed)
 Addended by: Eleonore Chiquito on: 04/24/2023 10:35 AM   Modules accepted: Orders

## 2023-04-26 ENCOUNTER — Ambulatory Visit (HOSPITAL_BASED_OUTPATIENT_CLINIC_OR_DEPARTMENT_OTHER)
Admission: RE | Admit: 2023-04-26 | Discharge: 2023-04-26 | Disposition: A | Discharge status: Home / Self Care | Payer: 59 | Source: Ambulatory Visit | Attending: Cardiology | Admitting: Cardiology

## 2023-04-26 DIAGNOSIS — I77810 Thoracic aortic ectasia: Secondary | ICD-10-CM | POA: Insufficient documentation

## 2023-05-02 ENCOUNTER — Ambulatory Visit (HOSPITAL_BASED_OUTPATIENT_CLINIC_OR_DEPARTMENT_OTHER): Payer: 59

## 2023-05-02 NOTE — Telephone Encounter (Signed)
   Patient Name: KATIE MOCH  DOB: 01-19-1971 MRN: [id number]  Primary Cardiologist: None  Chart reviewed as part of pre-operative protocol coverage. Given past medical history and time since last visit, based on ACC/AHA guidelines, ADRIENE PADULA is at acceptable risk for the planned procedure without further cardiovascular testing.   The patient was advised that if he develops new symptoms prior to surgery to contact our office to arrange for a follow-up visit, and he verbalized understanding.  I will route this recommendation to the requesting party via Epic fax function and remove from pre-op pool.  Please call with questions.  Napoleon Form, Leodis Rains, NP 05/02/2023, 5:10 PM

## 2023-05-09 ENCOUNTER — Encounter (HOSPITAL_COMMUNITY): Payer: Self-pay

## 2023-05-09 NOTE — Progress Notes (Addendum)
 PCP - Ricky Stabs, NP Cardiologist - Clearance Robin Searing, NP 04-23-23 epic  Belva Crome, MD  LOV 04-19-23 epic  PPM/ICD -  Device Orders -  Rep Notified -   Chest x-ray - CT chest 05-02-23 epic EKG - 04-19-23 epic Stress Test - 04-20-23 epic ECHO - 04-19-23 epic Cardiac Cath -   Sleep Study -  CPAP -   Fasting Blood Sugar -  Checks Blood Sugar _____ times a day  Blood Thinner Instructions: Aspirin Instructions:  ERAS Protcol - PRE-SURGERY Ensure or G2-    COVID vaccine -  Activity--  Anesthesia review-mild aortic stenosis, aortic aneurysms, HTN, pre DM  Patient denies shortness of breath, fever, cough and chest pain at PAT appointment   All instructions explained to the patient, with a verbal understanding of the material. Patient agrees to go over the instructions while at home for a better understanding. Patient also instructed to self quarantine after being tested for COVID-19. The opportunity to ask questions was provided.

## 2023-05-09 NOTE — Patient Instructions (Addendum)
 SURGICAL WAITING ROOM VISITATION  Patients having surgery or a procedure may have no more than 2 support people in the waiting area - these visitors may rotate.    Children under the age of 41 must have an adult with them who is not the patient.  Due to an increase in RSV and influenza rates and associated hospitalizations, children ages 26 and under may not visit patients in [facility] hospitals.  Visitors with respiratory illnesses are discouraged from visiting and should remain at home.  If the patient needs to stay at the hospital during part of their recovery, the visitor guidelines for inpatient rooms apply. Pre-op nurse will coordinate an appropriate time for 1 support person to accompany patient in pre-op.  This support person may not rotate.    Please refer to the [facility] website for the visitor guidelines for Inpatients (after your surgery is over and you are in a regular room).       Your procedure is scheduled on: 05-11-23    Report to [facility] Main Entrance    Report to admitting at      12:30  PM   Call this number if you have problems the morning of surgery [phone]   Do not eat food :After Midnight.   After Midnight you may have the following liquids until   1140______ AM/ DAY OF SURGERY  then nothing by mouth  Water Non-Citrus Juices (without pulp, NO RED-Apple, White grape, White cranberry) Black Coffee (NO MILK/CREAM OR CREAMERS, sugar ok)  Clear Tea (NO MILK/CREAM OR CREAMERS, sugar ok) regular and decaf                             Plain Jell-O (NO RED)                                           Fruit ices (not with fruit pulp, NO RED)                                     Popsicles (NO RED)                                                               Sports drinks like Gatorade (NO RED)                   The day of surgery:  Drink ONE (1) Pre-Surgery G2 BY 1140 AM the morning of surgery. Drink in one sitting. Do not sip.  This drink was  given to you during your hospital  pre-op appointment visit. Nothing else to drink after completing the  Pre-Surgery  G2.          If you have questions, please contact your surgeon's office.   FOLLOW  ANY ADDITIONAL PRE OP INSTRUCTIONS YOU RECEIVED FROM YOUR SURGEON'S OFFICE!!!     Oral Hygiene is also important to reduce your risk of infection.  Remember - BRUSH YOUR TEETH THE MORNING OF SURGERY WITH YOUR REGULAR TOOTHPASTE  DENTURES WILL BE REMOVED PRIOR TO SURGERY PLEASE DO NOT APPLY "Poly grip" OR ADHESIVES!!!   Do NOT smoke after Midnight   Stop all vitamins and herbal supplements 7 days before surgery.   Take these medicines the morning of surgery with A SIP OF WATER: Zoloft, oxycodone if needed, amlodipine                                You may not have any metal on your body including hair pins, jewelry, and body piercing             Do not wear  lotions, powders, cologne, or deodorant                Men may shave face and neck.   Do not bring valuables to the hospital. [facility] IS NOT             RESPONSIBLE   FOR VALUABLES.   Contacts, glasses, dentures or bridgework may not be worn into surgery.   Bring small overnight bag day of surgery.   DO NOT BRING YOUR HOME MEDICATIONS TO THE HOSPITAL. PHARMACY WILL DISPENSE MEDICATIONS LISTED ON YOUR MEDICATION LIST TO YOU DURING YOUR ADMISSION IN THE HOSPITAL!    Patients discharged on the day of surgery will not be allowed to drive home.  Someone NEEDS to stay with you for the first 24 hours after anesthesia.   Special Instructions: Bring a copy of your healthcare power of attorney and living will documents the day of surgery if you haven't scanned them before.              Please read over the following fact sheets you were given: IF YOU HAVE QUESTIONS ABOUT YOUR PRE-OP INSTRUCTIONS PLEASE CALL [phone]   If you test positive for Covid or have been in contact with anyone  that has tested positive in the last 10 days please notify you surgeon.  [place] - Preparing for Surgery Before surgery, you can play an important role.  Because skin is not sterile, your skin needs to be as free of germs as possible.  You can reduce the number of germs on your skin by washing with CHG (chlorahexidine gluconate) soap before surgery.  CHG is an antiseptic cleaner which kills germs and bonds with the skin to continue killing germs even after washing. Please DO NOT use if you have an allergy to CHG or antibacterial soaps.  If your skin becomes reddened/irritated stop using the CHG and inform your nurse when you arrive at Short Stay. Do not shave (including legs and underarms) for at least 48 hours prior to the first CHG shower.  You may shave your face/neck. Please follow these instructions carefully:  1.  Shower with CHG Soap the night before surgery and the  morning of Surgery.  2.  If you choose to wash your hair, wash your hair first as usual with your  normal  shampoo.  3.  After you shampoo, rinse your hair and body thoroughly to remove the  shampoo.                           4.  Use CHG as you would any other liquid soap.  You can apply chg directly  to the skin and wash  Gently with a scrungie or clean washcloth.  5.  Apply the CHG Soap to your body ONLY FROM THE NECK DOWN.   Do not use on face/ open                           Wound or open sores. Avoid contact with eyes, ears mouth and genitals (private parts).                       Wash face,  Genitals (private parts) with your normal soap.             6.  Wash thoroughly, paying special attention to the area where your surgery  will be performed.  7.  Thoroughly rinse your body with warm water from the neck down.  8.  DO NOT shower/wash with your normal soap after using and rinsing off  the CHG Soap.                9.  Pat yourself dry with a clean towel.            10.  Wear clean pajamas.             11.  Place clean sheets on your bed the night of your first shower and do not  sleep with pets. Day of Surgery : Do not apply any lotions/deodorants the morning of surgery.  Please wear clean clothes to the hospital/surgery center.  FAILURE TO FOLLOW THESE INSTRUCTIONS MAY RESULT IN THE CANCELLATION OF YOUR SURGERY PATIENT SIGNATURE_________________________________  NURSE SIGNATURE__________________________________  ________________________________________________________________________________________________________________________________________________  Darren [name]  An incentive spirometer is a tool that can help keep your lungs clear and active. This tool measures how well you are filling your lungs with each breath. Taking long deep breaths may help reverse or decrease the chance of developing breathing (pulmonary) problems (especially infection) following: A long period of time when you are unable to move or be active. BEFORE THE PROCEDURE  If the spirometer includes an indicator to show your best effort, your nurse or respiratory therapist will set it to a desired goal. If possible, sit up straight or lean slightly forward. Try not to slouch. Hold the incentive spirometer in an upright position. INSTRUCTIONS FOR USE  Sit on the edge of your bed if possible, or sit up as far as you can in bed or on a chair. Hold the incentive spirometer in an upright position. Breathe out normally. Place the mouthpiece in your mouth and seal your lips tightly around it. Breathe in slowly and as deeply as possible, raising the piston or the ball toward the top of the column. Hold your breath for 3-5 seconds or for as long as possible. Allow the piston or ball to fall to the bottom of the column. Remove the mouthpiece from your mouth and breathe out normally. Rest for a few seconds and repeat Steps 1 through 7 at least 10 times every 1-2 hours when you are awake. Take your time and take a  few normal breaths between deep breaths. The spirometer may include an indicator to show your best effort. Use the indicator as a goal to work toward during each repetition. After each set of 10 deep breaths, practice coughing to be sure your lungs are clear. If you have an incision (the cut made at the time of surgery), support your incision when coughing by placing a pillow or rolled up towels firmly  against it. Once you are able to get out of bed, walk around indoors and cough well. You may stop using the incentive spirometer when instructed by your caregiver.  RISKS AND COMPLICATIONS Take your time so you do not get dizzy or light-headed. If you are in pain, you may need to take or ask for pain medication before doing incentive spirometry. It is harder to take a deep breath if you are having pain. AFTER USE Rest and breathe slowly and easily. It can be helpful to keep track of a log of your progress. Your caregiver can provide you with a simple table to help with this. If you are using the spirometer at home, follow these instructions: SEEK MEDICAL CARE IF:  You are having difficultly using the spirometer. You have trouble using the spirometer as often as instructed. Your pain medication is not giving enough relief while using the spirometer. You develop fever of 100.5 F (38.1 C) or higher. SEEK IMMEDIATE MEDICAL CARE IF:  You cough up bloody sputum that had not been present before. You develop fever of 102 F (38.9 C) or greater. You develop worsening pain at or near the incision site. MAKE SURE YOU:  Understand these instructions. Will watch your condition. Will get help right away if you are not doing well or get worse. Document Released: 06/26/2006 Document Revised: 05/08/2011 Document Reviewed: 08/27/2006 [facility] Patient Information 2014 [place], [place].   ________________________________________________________________________

## 2023-05-10 ENCOUNTER — Encounter (HOSPITAL_COMMUNITY): Payer: Self-pay

## 2023-05-10 ENCOUNTER — Encounter (HOSPITAL_COMMUNITY)
Admission: RE | Admit: 2023-05-10 | Discharge: 2023-05-10 | Disposition: A | Discharge status: Home / Self Care | Source: Ambulatory Visit | Attending: Orthopedic Surgery | Admitting: Orthopedic Surgery

## 2023-05-10 ENCOUNTER — Other Ambulatory Visit: Payer: Self-pay

## 2023-05-10 VITALS — Ht 67.0 in | Wt 156.0 lb

## 2023-05-10 DIAGNOSIS — I2510 Atherosclerotic heart disease of native coronary artery without angina pectoris: Secondary | ICD-10-CM | POA: Diagnosis not present

## 2023-05-10 DIAGNOSIS — X58XXXA Exposure to other specified factors, initial encounter: Secondary | ICD-10-CM | POA: Insufficient documentation

## 2023-05-10 DIAGNOSIS — Z01812 Encounter for preprocedural laboratory examination: Secondary | ICD-10-CM | POA: Diagnosis present

## 2023-05-10 DIAGNOSIS — Z87891 Personal history of nicotine dependence: Secondary | ICD-10-CM | POA: Diagnosis not present

## 2023-05-10 DIAGNOSIS — Z01818 Encounter for other preprocedural examination: Secondary | ICD-10-CM

## 2023-05-10 DIAGNOSIS — S82832A Other fracture of upper and lower end of left fibula, initial encounter for closed fracture: Secondary | ICD-10-CM | POA: Insufficient documentation

## 2023-05-10 DIAGNOSIS — I10 Essential (primary) hypertension: Secondary | ICD-10-CM

## 2023-05-10 DIAGNOSIS — I7122 Aneurysm of the aortic arch, without rupture: Secondary | ICD-10-CM | POA: Diagnosis not present

## 2023-05-10 DIAGNOSIS — I119 Hypertensive heart disease without heart failure: Secondary | ICD-10-CM | POA: Insufficient documentation

## 2023-05-10 HISTORY — DX: Acute myocardial infarction, unspecified: I21.9

## 2023-05-10 HISTORY — DX: Cardiac murmur, unspecified: R01.1

## 2023-05-10 HISTORY — DX: Cerebral infarction, unspecified: I63.9

## 2023-05-10 HISTORY — DX: Prediabetes: R73.03

## 2023-05-10 HISTORY — DX: Headache, unspecified: R51.9

## 2023-05-10 HISTORY — DX: Gastro-esophageal reflux disease without esophagitis: K21.9

## 2023-05-10 LAB — BASIC METABOLIC PANEL
Anion gap: 11 (ref 5–15)
BUN: 14 mg/dL (ref 6–20)
CO2: 24 mmol/L (ref 22–32)
Calcium: 9.5 mg/dL (ref 8.9–10.3)
Chloride: 98 mmol/L (ref 98–111)
Creatinine, Ser: 0.96 mg/dL (ref 0.61–1.24)
GFR, Estimated: >60 mL/min (ref >60)
Glucose, Bld: 87 mg/dL (ref 70–99)
Potassium: 3.5 mmol/L (ref 3.5–5.1)
Sodium: 133 mmol/L — ABNORMAL LOW (ref 135–145)

## 2023-05-10 LAB — CBC
HCT: 43.6 % (ref 39.0–52.0)
Hemoglobin: 14 g/dL (ref 13.0–17.0)
MCH: 27.6 pg (ref 26.0–34.0)
MCHC: 32.1 g/dL (ref 30.0–36.0)
MCV: 85.8 fL (ref 80.0–100.0)
Platelets: 358 10*3/uL (ref 150–400)
RBC: 5.08 MIL/uL (ref 4.22–5.81)
RDW: 13.8 % (ref 11.5–15.5)
WBC: 6.4 10*3/uL (ref 4.0–10.5)
nRBC: 0 % (ref 0.0–0.2)

## 2023-05-10 LAB — SURGICAL PCR SCREEN
MRSA, PCR: NEGATIVE
Staphylococcus aureus: NEGATIVE

## 2023-05-10 NOTE — Progress Notes (Signed)
 Anesthesia Chart Review   Case: [id number] Date/Time: 05/11/23 1345   Procedure: OPEN REDUCTION INTERNAL FIXATION (ORIF) ANKLE FRACTURE (Left: Ankle)   Anesthesia type: Regional   Pre-op diagnosis: Left ankle fracture distal fibula   Location: [facility] ROOM 06 / [facility]   Surgeons: Yolonda Kida, MD       DISCUSSION:53 y.o. former smoker with h/o HTN, CAD, mild aortic valve stenosis, left ankle distal fibula fracture scheduled for above procedure 05/11/2023 with Dr. Duwayne Heck.   Recent stress test low risk study.   Recent Echo with mild aortic stenosis, moderate aortic regurgitation.  Chest CT ordered to follow up on aortic aneurysm measuring 44 mm.   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   Per cardiology preoperative evaluation 05/02/2023, "Chart reviewed as part of pre-operative protocol coverage. Given past medical history and time since last visit, based on ACC/AHA guidelines, JASIR ROTHER is at acceptable risk for the planned procedure without further cardiovascular testing."  VS: Ht 5\' 7"  (1.702 m)   Wt 70.8 kg   BMI 24.43 kg/m   PROVIDERS: Rema Fendt, NP is PCP   Belva Crome, MD is Cardiologist  LABS: Labs reviewed: Acceptable for surgery. (all labs ordered are listed, but only abnormal results are displayed)  Labs Reviewed  BASIC METABOLIC PANEL - Abnormal; Notable for the following components:      Result Value   Sodium 133 (*)    All other components within normal limits  SURGICAL PCR SCREEN  CBC     IMAGES:   EKG:   CV: Myocardial Perfusion 04/20/2023   Baseline ECG is abnormal. ECG rhythm shows normal sinus rhythm. There is left ventricular hypertrophy with strain.   A pharmacological stress test was performed using IV Lexiscan 0.4mg  over 10 seconds performed without concurrent submaximal exercise. The patient reported dyspnea during the stress test. Normal blood pressure and normal heart rate response noted  during stress. Heart rate recovery was normal.   No ST deviation was noted compared to baseline EKG. Arrhythmias during recovery: rare PVCs. ECG was uninterpretable due to baseline repolarization abnormalities. The ECG was not diagnostic due to pharmacologic protocol.   LV perfusion is normal. There is no evidence of ischemia. There is no evidence of infarction.   Left ventricular function is normal. Nuclear stress EF: 57%. The left ventricular ejection fraction is normal (55-65%). End diastolic cavity size is normal. End systolic cavity size is mildly enlarged. No evidence of transient ischemic dilation (TID) noted.   Prior study not available for comparison.   The study is normal. The study is low risk.   Echo 04/19/2023 1. Left ventricular ejection fraction, by estimation, is 60 to 65%. The  left ventricle has normal function. The left ventricle has no regional  wall motion abnormalities. There is moderate left ventricular hypertrophy.  Left ventricular diastolic  parameters are consistent with Grade I diastolic dysfunction (impaired  relaxation). The global longitudinal strain is abnormal -18,2%   2. Right ventricular systolic function is normal. The right ventricular  size is normal. There is normal pulmonary artery systolic pressure.   3. The mitral valve is normal in structure. No evidence of mitral valve  regurgitation. No evidence of mitral stenosis.   4. The aortic valve was not well visualized. Aortic valve regurgitation  is mild to moderate. Mild aortic valve stenosis.   5. Aneurysm of the descending aorta, measuring 44 mm. There is moderate  dilatation of the  ascending aorta, measuring 41 mm.   6. The inferior vena cava is normal in size with greater than 50%  respiratory variability, suggesting right atrial pressure of 3 mmHg.   Past Medical History:  Diagnosis Date   Angina pectoris (HCC) 11/11/2015   Anxiety    Aortic insufficiency    moderate to severe by echo 02/2020    Chest pain in adult 02/01/2017   Coronary artery disease 03/24/2014   NSTEMI s/p PCI of mid LAD and repeat cath with patent stent   Coronary artery disease due to lipid rich plaque    Coronary artery disease involving native heart 02/01/2017   Overview:   S/p PCI 02/2014     Formatting of this note might be different from the original.  S/p PCI 02/2014     Depression    Dyslipidemia 02/01/2017   Essential hypertension 03/04/2014   GERD (gastroesophageal reflux disease)    Headache    Heart murmur    Hypertension    Major depressive disorder, recurrent episode, moderate (HCC) 09/01/2017   MDD (major depressive disorder) 08/06/2014   Myocardial infarction (HCC)    Pre-diabetes    Prediabetes 06/28/2021   Preop cardiovascular exam    Pulmonary emphysema (HCC) 03/24/2016   pt. denies    Past Surgical History:  Procedure Laterality Date   CARDIAC CATHETERIZATION  03/04/2014   PCI of mid LAD   CARDIAC CATHETERIZATION N/A 11/12/2015   Procedure: Left Heart Cath and Coronary Angiography;  Surgeon: Tonny Bollman, MD;  Location: [facility] INVASIVE CV LAB;  Service: Cardiovascular;  Laterality: N/A;   LEFT HEART CATHETERIZATION WITH CORONARY ANGIOGRAM Bilateral 03/04/2014   Procedure: LEFT HEART CATHETERIZATION WITH CORONARY ANGIOGRAM;  Surgeon: Kathleene Hazel, MD;  Location: [facility] CATH LAB;  Service: Cardiovascular;  Laterality: Bilateral;   LEFT HEART CATHETERIZATION WITH CORONARY ANGIOGRAM N/A 03/26/2014   Procedure: LEFT HEART CATHETERIZATION WITH CORONARY ANGIOGRAM;  Surgeon: Lennette Bihari, MD;  Location: [facility] CATH LAB;  Service: Cardiovascular;  Laterality: N/A;    MEDICATIONS:  amLODipine (NORVASC) 5 MG tablet   aspirin EC 81 MG tablet   hydrochlorothiazide (HYDRODIURIL) 25 MG tablet   mirtazapine (REMERON) 15 MG tablet   nitroGLYCERIN (NITROSTAT) 0.4 MG SL tablet   oxyCODONE (OXY IR/ROXICODONE) 5 MG immediate release tablet   sertraline (ZOLOFT) 100 MG tablet   No current  facility-administered medications for this encounter.    Jodell Cipro Ward, PA-C [facility] Pre-Surgical Testing [phone]

## 2023-05-10 NOTE — Anesthesia Preprocedure Evaluation (Signed)
 Anesthesia Evaluation  Patient identified by MRN, date of birth, ID band Patient awake    Reviewed: Allergy & Precautions, NPO status , Patient's Chart, lab work & pertinent test results  History of Anesthesia Complications Negative for: history of anesthetic complications  Airway Mallampati: II  TM Distance: >3 FB Neck ROM: Full    Dental no notable dental hx. (+) Teeth Intact,    Pulmonary former smoker   Pulmonary exam normal breath sounds clear to auscultation       Cardiovascular hypertension, Pt. on medications (-) angina + Past MI (2015)  Normal cardiovascular exam Rhythm:Regular Rate:Normal  04/20/2023 echo  1. Left ventricular ejection fraction, by estimation, is 60 to 65%. The  left ventricle has normal function. The left ventricle has no regional  wall motion abnormalities. There is moderate left ventricular hypertrophy.  Left ventricular diastolic  parameters are consistent with Grade I diastolic dysfunction (impaired  relaxation). The global longitudinal strain is abnormal -18,2%   2. Right ventricular systolic function is normal. The right ventricular  size is normal. There is normal pulmonary artery systolic pressure.   3. The mitral valve is normal in structure. No evidence of mitral valve  regurgitation. No evidence of mitral stenosis.   4. The aortic valve was not well visualized. Aortic valve regurgitation  is mild to moderate. Mild aortic valve stenosis.   5. Aneurysm of the descending aorta, measuring 44 mm. There is moderate  dilatation of the ascending aorta, measuring 41 mm.   6. The inferior vena cava is normal in size with greater than 50%  respiratory variability, suggesting right atrial pressure of 3     Neuro/Psych  Headaches    GI/Hepatic Neg liver ROS,GERD  ,,  Endo/Other  negative endocrine ROS    Renal/GU Lab Results      Component                Value               Date                           K                        3.5                 05/10/2023                CO2                      24                  05/10/2023                BUN                      14                  05/10/2023                CREATININE               0.96                05/10/2023           05/10/2023                Musculoskeletal  Abdominal   Peds  Hematology Lab Results      Component                Value               Date                      WBC                      6.4                 05/10/2023                HGB                      14.0                05/10/2023                HCT                      43.6                05/10/2023                MCV                      85.8                05/10/2023                PLT                      358                 05/10/2023              Anesthesia Other Findings   Reproductive/Obstetrics                             Anesthesia Physical Anesthesia Plan  ASA: 3  Anesthesia Plan: Regional and General   Post-op Pain Management: Regional block* and Minimal or no pain anticipated   Induction: Intravenous  PONV Risk Score and Plan: 2 and Treatment may vary due to age or medical condition, Ondansetron, Midazolam, Propofol infusion and TIVA  Airway Management Planned: LMA  Additional Equipment: None  Intra-op Plan:   Post-operative Plan:   Informed Consent: I have reviewed the patients History and Physical, chart, labs and discussed the procedure including the risks, benefits and alternatives for the proposed anesthesia with the patient or authorized representative who has indicated his/her understanding and acceptance.     Dental advisory given  Plan Discussed with: CRNA and Surgeon  Anesthesia Plan Comments: (L Pop + L adductor + General)       Anesthesia Quick Evaluation

## 2023-05-11 ENCOUNTER — Ambulatory Visit (HOSPITAL_COMMUNITY)
Admission: RE | Admit: 2023-05-11 | Discharge: 2023-05-11 | Disposition: A | Discharge status: Home / Self Care | Source: Ambulatory Visit | Attending: Orthopedic Surgery | Admitting: Orthopedic Surgery

## 2023-05-11 ENCOUNTER — Ambulatory Visit (HOSPITAL_COMMUNITY): Admitting: Anesthesiology

## 2023-05-11 ENCOUNTER — Ambulatory Visit (HOSPITAL_COMMUNITY)

## 2023-05-11 ENCOUNTER — Other Ambulatory Visit: Payer: Self-pay

## 2023-05-11 ENCOUNTER — Encounter (HOSPITAL_COMMUNITY)
Admission: RE | Disposition: A | Discharge status: Home / Self Care | Payer: Self-pay | Source: Ambulatory Visit | Attending: Orthopedic Surgery

## 2023-05-11 ENCOUNTER — Encounter (HOSPITAL_COMMUNITY): Payer: Self-pay | Admitting: Orthopedic Surgery

## 2023-05-11 ENCOUNTER — Ambulatory Visit (HOSPITAL_BASED_OUTPATIENT_CLINIC_OR_DEPARTMENT_OTHER): Admitting: Anesthesiology

## 2023-05-11 DIAGNOSIS — S82402A Unspecified fracture of shaft of left fibula, initial encounter for closed fracture: Secondary | ICD-10-CM

## 2023-05-11 DIAGNOSIS — S82832A Other fracture of upper and lower end of left fibula, initial encounter for closed fracture: Secondary | ICD-10-CM | POA: Diagnosis present

## 2023-05-11 DIAGNOSIS — I10 Essential (primary) hypertension: Secondary | ICD-10-CM | POA: Insufficient documentation

## 2023-05-11 DIAGNOSIS — I252 Old myocardial infarction: Secondary | ICD-10-CM | POA: Insufficient documentation

## 2023-05-11 DIAGNOSIS — Z87891 Personal history of nicotine dependence: Secondary | ICD-10-CM | POA: Diagnosis not present

## 2023-05-11 DIAGNOSIS — X58XXXA Exposure to other specified factors, initial encounter: Secondary | ICD-10-CM | POA: Insufficient documentation

## 2023-05-11 DIAGNOSIS — Z79899 Other long term (current) drug therapy: Secondary | ICD-10-CM | POA: Insufficient documentation

## 2023-05-11 HISTORY — PX: ORIF ANKLE FRACTURE: SHX5408

## 2023-05-11 SURGERY — OPEN REDUCTION INTERNAL FIXATION (ORIF) ANKLE FRACTURE
Anesthesia: Regional | Site: Ankle | Laterality: Left

## 2023-05-11 MED ORDER — OXYCODONE HCL 5 MG PO TABS
5.0000 mg | ORAL_TABLET | Freq: Once | ORAL | Status: AC | PRN
  Administered 2023-05-11: 5 mg via ORAL

## 2023-05-11 MED ORDER — OXYCODONE HCL 5 MG PO TABS
ORAL_TABLET | ORAL | Status: AC
  Filled 2023-05-11: qty 1

## 2023-05-11 MED ORDER — ONDANSETRON HCL 4 MG/2ML IJ SOLN
INTRAMUSCULAR | Status: DC | PRN
  Administered 2023-05-11: 4 mg via INTRAVENOUS

## 2023-05-11 MED ORDER — ROPIVACAINE HCL 5 MG/ML IJ SOLN
INTRAMUSCULAR | Status: DC | PRN
  Administered 2023-05-11: 20 mL via PERINEURAL

## 2023-05-11 MED ORDER — LIDOCAINE HCL (PF) 2 % IJ SOLN
INTRAMUSCULAR | Status: DC | PRN
  Administered 2023-05-11: 100 mg via INTRADERMAL

## 2023-05-11 MED ORDER — PROPOFOL 10 MG/ML IV BOLUS
INTRAVENOUS | Status: AC
  Filled 2023-05-11: qty 20

## 2023-05-11 MED ORDER — HYDROMORPHONE HCL 1 MG/ML IJ SOLN: 0.2500 mg | INTRAMUSCULAR | Status: DC | PRN

## 2023-05-11 MED ORDER — ACETAMINOPHEN 10 MG/ML IV SOLN: 1000.0000 mg | Freq: Once | INTRAVENOUS | Status: DC | PRN

## 2023-05-11 MED ORDER — OXYCODONE HCL 5 MG/5ML PO SOLN: 5.0000 mg | Freq: Once | ORAL | Status: AC | PRN

## 2023-05-11 MED ORDER — AMISULPRIDE (ANTIEMETIC) 5 MG/2ML IV SOLN: 10.0000 mg | Freq: Once | INTRAVENOUS | Status: DC | PRN

## 2023-05-11 MED ORDER — ONDANSETRON HCL 4 MG PO TABS: 4.0000 mg | ORAL_TABLET | Freq: Three times a day (TID) | ORAL | 0 refills | Status: AC | PRN

## 2023-05-11 MED ORDER — ONDANSETRON HCL 4 MG/2ML IJ SOLN: 4.0000 mg | Freq: Once | INTRAMUSCULAR | Status: DC | PRN

## 2023-05-11 MED ORDER — LACTATED RINGERS IV SOLN
INTRAVENOUS | Status: DC
Start: 2023-05-11 — End: 2023-05-11

## 2023-05-11 MED ORDER — BUPIVACAINE HCL (PF) 0.5 % IJ SOLN
INTRAMUSCULAR | Status: DC | PRN
  Administered 2023-05-11: 30 mL via PERINEURAL

## 2023-05-11 MED ORDER — ORAL CARE MOUTH RINSE: 15.0000 mL | Freq: Once | OROMUCOSAL | Status: AC

## 2023-05-11 MED ORDER — CEFAZOLIN SODIUM-DEXTROSE 2-4 GM/100ML-% IV SOLN
2.0000 g | INTRAVENOUS | Status: AC
  Administered 2023-05-11: 2 g via INTRAVENOUS
  Filled 2023-05-11 (×2): qty 100

## 2023-05-11 MED ORDER — ONDANSETRON HCL 4 MG/2ML IJ SOLN
INTRAMUSCULAR | Status: AC
  Filled 2023-05-11: qty 2

## 2023-05-11 MED ORDER — CHLORHEXIDINE GLUCONATE 0.12 % MT SOLN
15.0000 mL | Freq: Once | OROMUCOSAL | Status: AC
  Administered 2023-05-11: 15 mL via OROMUCOSAL

## 2023-05-11 MED ORDER — OXYCODONE HCL 5 MG PO TABS: 5.0000 mg | ORAL_TABLET | ORAL | 0 refills | Status: AC | PRN

## 2023-05-11 MED ORDER — MIDAZOLAM HCL 2 MG/2ML IJ SOLN
1.0000 mg | INTRAMUSCULAR | Status: DC
  Administered 2023-05-11: 1 mg via INTRAVENOUS
  Filled 2023-05-11: qty 2

## 2023-05-11 MED ORDER — PROPOFOL 500 MG/50ML IV EMUL
INTRAVENOUS | Status: DC | PRN
  Administered 2023-05-11: 150 ug/kg/min via INTRAVENOUS

## 2023-05-11 MED ORDER — PROPOFOL 10 MG/ML IV BOLUS
INTRAVENOUS | Status: DC | PRN
  Administered 2023-05-11: 150 mg via INTRAVENOUS

## 2023-05-11 MED ORDER — FENTANYL CITRATE PF 50 MCG/ML IJ SOSY
50.0000 ug | PREFILLED_SYRINGE | INTRAMUSCULAR | Status: DC
  Administered 2023-05-11: 50 ug via INTRAVENOUS
  Filled 2023-05-11: qty 2

## 2023-05-11 SURGICAL SUPPLY — 44 items
BAG COUNTER SPONGE SURGICOUNT (BAG) IMPLANT
BAG ZIPLOCK 12X15 (MISCELLANEOUS) ×1 IMPLANT
BIT DRILL 2 CANN GRADUATED (BIT) IMPLANT
BIT DRILL 2.5 CANN STRL (BIT) IMPLANT
BNDG COHESIVE 4X5 TAN STRL LF (GAUZE/BANDAGES/DRESSINGS) ×1 IMPLANT
BNDG ELASTIC 3X5.8 VLCR NS LF (GAUZE/BANDAGES/DRESSINGS) IMPLANT
BNDG ELASTIC 4INX 5YD STR LF (GAUZE/BANDAGES/DRESSINGS) ×1 IMPLANT
BNDG ELASTIC 6INX 5YD STR LF (GAUZE/BANDAGES/DRESSINGS) ×1 IMPLANT
COVER SURGICAL LIGHT HANDLE (MISCELLANEOUS) ×1 IMPLANT
CUFF TRNQT CYL 34X4.125X (TOURNIQUET CUFF) ×1 IMPLANT
DRAPE C-ARM 42X120 X-RAY (DRAPES) ×1 IMPLANT
DRAPE C-ARMOR (DRAPES) ×1 IMPLANT
DRAPE U-SHAPE 47X51 STRL (DRAPES) ×1 IMPLANT
DRSG ADAPTIC 3X8 NADH LF (GAUZE/BANDAGES/DRESSINGS) ×1 IMPLANT
DRSG EMULSION OIL 3X16 NADH (GAUZE/BANDAGES/DRESSINGS) IMPLANT
DURAPREP 26ML APPLICATOR (WOUND CARE) ×1 IMPLANT
ELECT REM PT RETURN 15FT ADLT (MISCELLANEOUS) ×1 IMPLANT
GAUZE PAD ABD 8X10 STRL (GAUZE/BANDAGES/DRESSINGS) ×2 IMPLANT
GAUZE SPONGE 4X4 12PLY STRL (GAUZE/BANDAGES/DRESSINGS) ×2 IMPLANT
GLOVE BIO SURGEON STRL SZ7.5 (GLOVE) ×2 IMPLANT
GLOVE BIOGEL PI IND STRL 8 (GLOVE) ×2 IMPLANT
IMPL TIGHTROP W/DRV K-LESS (Anchor) IMPLANT
IMPLANT TIGHTROPE W/DRV K-LESS (Anchor) ×1 IMPLANT
KIT BASIN OR (CUSTOM PROCEDURE TRAY) ×1 IMPLANT
KIT TURNOVER KIT A (KITS) IMPLANT
MANIFOLD NEPTUNE II (INSTRUMENTS) ×1 IMPLANT
NS IRRIG 1000ML POUR BTL (IV SOLUTION) ×1 IMPLANT
PACK ORTHO EXTREMITY (CUSTOM PROCEDURE TRAY) ×1 IMPLANT
PAD CAST 4YDX4 CTTN HI CHSV (CAST SUPPLIES) ×2 IMPLANT
PADDING CAST COTTON 6X4 STRL (CAST SUPPLIES) IMPLANT
PENCIL SMOKE EVACUATOR (MISCELLANEOUS) IMPLANT
PLATE THIRD TUBULAR 7 HOLE (Plate) IMPLANT
PROTECTOR NERVE ULNAR (MISCELLANEOUS) ×1 IMPLANT
SCREW CANCELLOUS LP 4.0X18 (Screw) IMPLANT
SCREW COMP KREULOCK 3.5X16 (Screw) IMPLANT
SCREW LOW PROFILE 3.5X14 (Screw) IMPLANT
SCREW NLOCK T15 FT 18X3.5XST (Screw) IMPLANT
SPLINT PLASTER CAST FAST 5X30 (CAST SUPPLIES) IMPLANT
SUT ETHILON 3 0 PS 1 (SUTURE) ×3 IMPLANT
SUT MNCRL AB 4-0 PS2 18 (SUTURE) IMPLANT
SUT VIC AB 0 CT1 27XBRD ANTBC (SUTURE) ×1 IMPLANT
SUT VIC AB 1 CT1 36 (SUTURE) ×1 IMPLANT
SUT VIC AB 2-0 CT1 TAPERPNT 27 (SUTURE) ×1 IMPLANT
TOWEL OR 17X26 10 PK STRL BLUE (TOWEL DISPOSABLE) ×1 IMPLANT

## 2023-05-11 NOTE — Brief Op Note (Signed)
 05/11/2023  4:10 PM  PATIENT:  Darren [name]  53 y.o. male  PRE-OPERATIVE DIAGNOSIS:  Left ankle fracture distal fibula  POST-OPERATIVE DIAGNOSIS:  Left ankle fracture distal fibula Left ankle syndesmosis disruption  PROCEDURE:  Procedure(s): OPEN REDUCTION INTERNAL FIXATION (ORIF) ANKLE FRACTURE (Left)  SURGEON:  Surgeons and Role:    * Yolonda Kida, MD - Primary  PHYSICIAN ASSISTANT: Dion Saucier, PA-C  ANESTHESIA:   regional and general  EBL:  10 cc  BLOOD ADMINISTERED:none  DRAINS: none   LOCAL MEDICATIONS USED:  NONE  SPECIMEN:  No Specimen  DISPOSITION OF SPECIMEN:  N/A  COUNTS:  YES  TOURNIQUET:   Total Tourniquet Time Documented: Thigh (Left) - 51 minutes Total: Thigh (Left) - 51 minutes   DICTATION: .Note written in EPIC  PLAN OF CARE: Discharge to home after PACU  PATIENT DISPOSITION:  PACU - hemodynamically stable.   Delay start of Pharmacological VTE agent (>24hrs) due to surgical blood loss or risk of bleeding: not applicable

## 2023-05-11 NOTE — Transfer of Care (Signed)
 Immediate Anesthesia Transfer of Care Note  Patient: Darren [name]  Procedure(s) Performed: OPEN REDUCTION INTERNAL FIXATION (ORIF) ANKLE FRACTURE (Left: Ankle)  Patient Location: PACU  Anesthesia Type:General  Level of Consciousness: drowsy  Airway & Oxygen Therapy: Patient Spontanous Breathing and Patient connected to face mask oxygen  Post-op Assessment: Report given to RN and Post -op Vital signs reviewed and stable  Post vital signs: Reviewed and stable  Last Vitals:  Vitals Value Taken Time  BP 139/83 05/11/23 1615  Temp    Pulse 77 05/11/23 1620  Resp 12 05/11/23 1620  SpO2 100 % 05/11/23 1620  Vitals shown include unfiled device data.  Last Pain:  Vitals:   05/11/23 1423  TempSrc:   PainSc: 0-No pain      Patients Stated Pain Goal: 5 (05/11/23 1157)  Complications: No notable events documented.

## 2023-05-11 NOTE — H&P (Signed)
 ORTHOPAEDIC H&P  REQUESTING PHYSICIAN: Yolonda Kida, MD  PCP:  Rema Fendt, NP  Chief Complaint: Left ankle fracture  HPI: Darren [name] is a 53 y.o. male who complains of left ankle pain and difficulty with weightbearing.  Here today for open reduction internal fixation of left ankle with syndesmotic fixation.  We did have a slight delay given need for cardiac clearance.  Today for the surgery.  Past Medical History:  Diagnosis Date   Angina pectoris (HCC) 11/11/2015   Anxiety    Aortic insufficiency    moderate to severe by echo 02/2020   Chest pain in adult 02/01/2017   Coronary artery disease 03/24/2014   NSTEMI s/p PCI of mid LAD and repeat cath with patent stent   Coronary artery disease due to lipid rich plaque    Coronary artery disease involving native heart 02/01/2017   Overview:   S/p PCI 02/2014     Formatting of this note might be different from the original.  S/p PCI 02/2014     Depression    Dyslipidemia 02/01/2017   Essential hypertension 03/04/2014   GERD (gastroesophageal reflux disease)    Headache    Heart murmur    Hypertension    Major depressive disorder, recurrent episode, moderate (HCC) 09/01/2017   MDD (major depressive disorder) 08/06/2014   Myocardial infarction (HCC)    Pre-diabetes    Prediabetes 06/28/2021   Preop cardiovascular exam    Pulmonary emphysema (HCC) 03/24/2016   pt. denies   Past Surgical History:  Procedure Laterality Date   CARDIAC CATHETERIZATION  03/04/2014   PCI of mid LAD   CARDIAC CATHETERIZATION N/A 11/12/2015   Procedure: Left Heart Cath and Coronary Angiography;  Surgeon: Tonny Bollman, MD;  Location: [facility] INVASIVE CV LAB;  Service: Cardiovascular;  Laterality: N/A;   LEFT HEART CATHETERIZATION WITH CORONARY ANGIOGRAM Bilateral 03/04/2014   Procedure: LEFT HEART CATHETERIZATION WITH CORONARY ANGIOGRAM;  Surgeon: Kathleene Hazel, MD;  Location: [facility] CATH LAB;  Service: Cardiovascular;  Laterality:  Bilateral;   LEFT HEART CATHETERIZATION WITH CORONARY ANGIOGRAM N/A 03/26/2014   Procedure: LEFT HEART CATHETERIZATION WITH CORONARY ANGIOGRAM;  Surgeon: Lennette Bihari, MD;  Location: [facility] CATH LAB;  Service: Cardiovascular;  Laterality: N/A;   Social History   Socioeconomic History   Marital status: Divorced    Spouse name: Not on file   Number of children: Not on file   Years of education: Not on file   Highest education level: Not on file  Occupational History   Not on file  Tobacco Use   Smoking status: Former    Current packs/day: 0.00    Average packs/day: 0.5 packs/day for 30.0 years (15.0 ttl pk-yrs)    Types: Cigarettes    Start date: 12/28/1985    Quit date: 12/29/2015    Years since quitting: 7.3    Passive exposure: Past   Smokeless tobacco: Never  Vaping Use   Vaping status: Former  Substance and Sexual Activity   Alcohol use: Yes    Alcohol/week: 2.0 standard drinks of alcohol    Types: 2 Standard drinks or equivalent per week    Comment: socially   Drug use: Not Currently    Types: Marijuana    Comment: daily use   Sexual activity: Not Currently    Birth control/protection: Condom  Other Topics Concern   Not on file  Social History Narrative   Not on file   Social Drivers of Corporate investment banker  Strain: Not on file  Food Insecurity: Not on file  Transportation Needs: Not on file  Physical Activity: Not on file  Stress: Not on file  Social Connections: Unknown (07/12/2021)   Received from [facility]   Social Network    Social Network: Not on file   Family History  Problem Relation Age of Onset   Hypertension Mother    Hypertension Father    Alcohol abuse Father    Hypertension Other    Diabetes Other    Heart attack Other    Allergies  Allergen Reactions   Hydrocodone Hives   Prior to Admission medications   Medication Sig Start Date End Date Taking? Authorizing Provider  amLODipine (NORVASC) 5 MG tablet Take 1  tablet (5 mg total) by mouth daily. 04/19/23  Yes Revankar, Aundra Dubin, MD  aspirin EC 81 MG tablet Take 81 mg by mouth daily.   Yes [provider]  hydrochlorothiazide (HYDRODIURIL) 25 MG tablet Take 1 tablet (25 mg total) by mouth daily. 04/19/23  Yes Revankar, Aundra Dubin, MD  oxyCODONE (OXY IR/ROXICODONE) 5 MG immediate release tablet Take 5 mg by mouth every 6 (six) hours as needed for severe pain (pain score 7-10). 04/30/23  Yes [provider]  sertraline (ZOLOFT) 100 MG tablet Take 200 mg by mouth daily.   Yes [provider]  mirtazapine (REMERON) 15 MG tablet Take 15 mg by mouth at bedtime.    [provider]  nitroGLYCERIN (NITROSTAT) 0.4 MG SL tablet Place 1 tablet (0.4 mg total) under the tongue every 5 (five) minutes as needed for chest pain. Patient not taking: Reported on 05/04/2023 09/01/17   Doristine Bosworth, MD   No results found.  Positive ROS: All other systems have been reviewed and were otherwise negative with the exception of those mentioned in the HPI and as above.  Physical Exam: General: Alert, no acute distress Cardiovascular: No pedal edema Respiratory: No cyanosis, no use of accessory musculature GI: No organomegaly, abdomen is soft and non-tender Skin: No lesions in the area of chief complaint Neurologic: Sensation intact distally Psychiatric: Patient is competent for consent with normal mood and affect Lymphatic: No axillary or cervical lymphadenopathy  MUSCULOSKELETAL: Left lower extremity is warm and well-perfused.  Some mild swelling.  Otherwise neurovascular intact  Assessment: 1.  Left distal fibula fracture, closed.  2.  Left distal tibiofibular joint disruption  Plan: Plan to proceed today with open reduction internal fixation of distal fibula as well as fixation of the syndesmosis.  We again discussed this in detail.  The risks, benefits, and alternatives were discussed with the patient. There are risks associated with the  surgery including, but not limited to, problems with anesthesia (death), infection, differences in leg length/angulation/rotation, fracture of bones, loosening or failure of implants, malunion, nonunion, hematoma (blood accumulation) which may require surgical drainage, blood clots, pulmonary embolism, nerve injury (foot drop), and blood vessel injury. The patient understands these risks and elects to proceed.     Yolonda Kida, MD Cell [phone]    05/11/2023 12:58 PM

## 2023-05-11 NOTE — Anesthesia Procedure Notes (Signed)
 Anesthesia Regional Block: Popliteal block   Pre-Anesthetic Checklist: , timeout performed,  Correct Patient, Correct Site, Correct Laterality,  Correct Procedure, Correct Position, site marked,  Risks and benefits discussed,  Pre-op evaluation,  At surgeon's request and post-op pain management  Laterality: Lower and Left  Prep: Maximum Sterile Barrier Precautions used, chloraprep       Needles:  Injection technique: Single-shot  Needle Type: Echogenic Needle     Needle Length: 9cm  Needle Gauge: 21     Additional Needles:   Procedures:,,,, ultrasound used (permanent image in chart),,    Narrative:  Start time: 05/11/2023 2:11 PM End time: 05/11/2023 2:16 PM Injection made incrementally with aspirations every 5 mL.  Performed by: Personally  Anesthesiologist: Trevor Iha, MD  Additional Notes: Block assessed. Patient tolerated procedure well.

## 2023-05-11 NOTE — Anesthesia Procedure Notes (Signed)
 Procedure Name: LMA Insertion Date/Time: 05/11/2023 2:54 PM  Performed by: Doran Clay, CRNAPre-anesthesia Checklist: Patient identified, Emergency Drugs available, Suction available, Patient being monitored and Timeout performed Patient Re-evaluated:Patient Re-evaluated prior to induction Oxygen Delivery Method: Circle system utilized Preoxygenation: Pre-oxygenation with 100% oxygen Induction Type: IV induction LMA: LMA inserted LMA Size: 4.0 Tube type: Oral Number of attempts: 1 Placement Confirmation: positive ETCO2 and breath sounds checked- equal and bilateral Tube secured with: Tape Dental Injury: Teeth and Oropharynx as per pre-operative assessment

## 2023-05-11 NOTE — Discharge Instructions (Signed)
-  Maintain postoperative splint at all times.  Do not let this become soiled or wet.  -You should elevate your operative extremity with your "toes above nose."  As a often as possible throughout the day.  -For mild to moderate pain you should take Tylenol and Advil in an alternating fashion scheduled around-the-clock.  For breakthrough pain you should take oxycodone as directed.  -Strictly nonweightbearing to the operative extremity at all times.  -For the prevention of blood clot she should take an 81 mg aspirin once per day for 6 weeks.  -Return to see Dr. Stann Mainland in the office in 2 weeks for routine postop care.

## 2023-05-11 NOTE — Anesthesia Procedure Notes (Signed)
 Anesthesia Regional Block: Adductor canal block   Pre-Anesthetic Checklist: , timeout performed,  Correct Patient, Correct Site, Correct Laterality,  Correct Procedure, Correct Position, site marked,  Risks and benefits discussed,  Surgical consent,  Pre-op evaluation,  At surgeon's request and post-op pain management  Laterality: Lower and Left  Prep: chloraprep       Needles:  Injection technique: Single-shot  Needle Type: Echogenic Needle     Needle Length: 9cm  Needle Gauge: 22     Additional Needles:   Procedures:,,,, ultrasound used (permanent image in chart),,    Narrative:  Start time: 05/11/2023 2:16 PM End time: 05/11/2023 2:20 PM Injection made incrementally with aspirations every 5 mL.  Performed by: Personally  Anesthesiologist: Trevor Iha, MD  Additional Notes: Block assessed prior to surgery. Pt tolerated procedure well.

## 2023-05-11 NOTE — Op Note (Signed)
 Date of Surgery: 05/11/2023  INDICATIONS: Darren [name] is a 53 y.o.-year-old male who sustained a left ankle fracture; he was indicated for open reduction and internal fixation due to the displaced nature of the articular fracture and came to the operating room today for this procedure. The patient did consent to the procedure after discussion of the risks and benefits.  PREOPERATIVE DIAGNOSIS:  1. left distal fibula ankle fracture, closed 2.  Left distal tibiofibular disruption (syndesmosis tear).  POSTOPERATIVE DIAGNOSIS: Same.  PROCEDURE:  Open treatment of left ankle fracture with internal fixation. Lateral malleolar CPT [id number];  2.  Open reduction internal fixation of left distal tibiofibular joint  SURGEON: Kenzy Campoverde P. Aundria Rud, M.D.  ASSIST: Dion Saucier, PA-C  Assistant attestation:  PA Mcclung present for the entire procedure.  ANESTHESIA:  general, regional anesthetic  TOURNIQUET TIME: 51 minutes left thigh 250 mmHg  IV FLUIDS AND URINE: See anesthesia.  ESTIMATED BLOOD LOSS: 10 mL.  IMPLANTS: Arthrex one third tubular distal fibula plate with proximal cortical screws and distal 1 locking screw and 1 cancellous screw Arthrex stainless steel tight rope for syndesmotic fixation  COMPLICATIONS: None.  DESCRIPTION OF PROCEDURE: The patient was brought to the operating room and placed supine on the operating table.  The patient had been signed prior to the procedure and this was documented. The patient had the anesthesia placed by the anesthesiologist.  A nonsterile tourniquet was placed on the upper thigh.  The prep verification and incision time-outs were performed to confirm that this was the correct patient, site, side and location. The patient had an SCD on the opposite lower extremity. The patient did receive antibiotics prior to the incision and was re-dosed during the procedure as needed at indicated intervals.  The patient had the lower extremity prepped and draped in the  standard surgical fashion.  The extremity was exsanguinated using an esmarch bandage and the tourniquet was inflated to 250 mm Hg.   Incision was made over the distal fibula and the fracture was exposed and reduced anatomically with a clamp.  Due to the chronic nature of this fracture had gone on to a shortened malunited position.  We were able to work through the callus with rondure and knife.  Once we were able to free up the oblique fracture site we were able to establish length with clamps through the fracture site.  There was some impaction/marginal bone loss after taking down that immature callus.  However anatomically aligned.. I then applied a 1/3 tubular locking plate and secured it proximally and distally with non-locking screws. Bone quality was poor. I used c-arm to confirm satisfactory reduction and fixation.    The syndesmosis was stressed using live fluoroscopy and found to be unstable.  This was also noted on preoperative weightbearing x-rays.  We then prepared for the internal fixation of the syndesmosis.  While placing a bump under the calf to elevate the calcaneus off of the bone foam we then placed the ankle in neutral position.  We then drilled from the lateral aspect of the distal fibula across through the medial cortex of the tibia.  We then assessed that we are parallel to the joint line in the appropriate position about 3 cm proximal to the plafond.  We then inserted the tight rope device.  We flipped the button on the medial cortex and were able to secure the close loop against the distal fibula plate and reduce the syndesmosis.  Stress exam was negative following that.  The  wounds were irrigated, and closed with vicryl with routine closure for the, with deep 2-0 Monocryl and 3-0 nylon for skin. The wounds were injected with local anesthetic. Sterile gauze was applied followed by a posterior splint. He was awakened and returned to the PACU in stable and satisfactory condition. There  were no complications.    POSTOPERATIVE PLAN: Darren [name] will remain nonweightbearing on this leg for approximately 4 weeks; Darren [name] will return for suture removal in 2 weeks.  He will be immobilized in a short leg splint and then transitioned to a CAM walker at his first follow up appointment.  Darren [name] will receive DVT prophylaxis based on other medications, activity level, and risk ratio of bleeding to thrombosis.  Maryan Rued, MD [facility] [place] [phone] 4:12 PM

## 2023-05-14 ENCOUNTER — Encounter (HOSPITAL_COMMUNITY): Payer: Self-pay | Admitting: Orthopedic Surgery

## 2023-05-14 NOTE — Anesthesia Postprocedure Evaluation (Signed)
 Anesthesia Post Note  Patient: RAYMOND BHARDWAJ  Procedure(s) Performed: OPEN REDUCTION INTERNAL FIXATION (ORIF) ANKLE FRACTURE (Left: Ankle)     Patient location during evaluation: PACU Anesthesia Type: Regional and General Level of consciousness: awake and alert Pain management: pain level controlled Vital Signs Assessment: post-procedure vital signs reviewed and stable Respiratory status: spontaneous breathing, nonlabored ventilation, respiratory function stable and patient connected to nasal cannula oxygen Cardiovascular status: blood pressure returned to baseline and stable Postop Assessment: no apparent nausea or vomiting Anesthetic complications: no   No notable events documented.  Last Vitals:  Vitals:   05/11/23 1630 05/11/23 1645  BP: (!) 142/87 137/79  Pulse: 72 72  Resp: 13 16  Temp:    SpO2: 93% 92%    Last Pain:  Vitals:   05/11/23 1715  TempSrc:   PainSc: 2    Pain Goal: Patients Stated Pain Goal: 5 (05/11/23 1157)                 Trevor Iha

## 2023-06-07 ENCOUNTER — Ambulatory Visit: Admitting: Family

## 2023-07-31 ENCOUNTER — Other Ambulatory Visit: Payer: Self-pay

## 2023-07-31 ENCOUNTER — Emergency Department (HOSPITAL_COMMUNITY)
Admission: EM | Admit: 2023-07-31 | Discharge: 2023-07-31 | Disposition: A | Discharge status: Home / Self Care | Attending: Emergency Medicine | Admitting: Emergency Medicine

## 2023-07-31 ENCOUNTER — Emergency Department (HOSPITAL_COMMUNITY)

## 2023-07-31 ENCOUNTER — Encounter (HOSPITAL_COMMUNITY): Payer: Self-pay

## 2023-07-31 DIAGNOSIS — R079 Chest pain, unspecified: Secondary | ICD-10-CM | POA: Diagnosis present

## 2023-07-31 DIAGNOSIS — E878 Other disorders of electrolyte and fluid balance, not elsewhere classified: Secondary | ICD-10-CM | POA: Diagnosis not present

## 2023-07-31 DIAGNOSIS — Z7982 Long term (current) use of aspirin: Secondary | ICD-10-CM | POA: Diagnosis not present

## 2023-07-31 DIAGNOSIS — E871 Hypo-osmolality and hyponatremia: Secondary | ICD-10-CM | POA: Diagnosis not present

## 2023-07-31 LAB — CBC
HCT: 44.8 % (ref 39.0–52.0)
Hemoglobin: 14.6 g/dL (ref 13.0–17.0)
MCH: 28 pg (ref 26.0–34.0)
MCHC: 32.6 g/dL (ref 30.0–36.0)
MCV: 85.8 fL (ref 80.0–100.0)
Platelets: 354 10*3/uL (ref 150–400)
RBC: 5.22 MIL/uL (ref 4.22–5.81)
RDW: 15.7 % — ABNORMAL HIGH (ref 11.5–15.5)
WBC: 6 10*3/uL (ref 4.0–10.5)
nRBC: 0 % (ref 0.0–0.2)

## 2023-07-31 LAB — BASIC METABOLIC PANEL WITH GFR
Anion gap: 9 (ref 5–15)
BUN: 12 mg/dL (ref 6–20)
CO2: 23 mmol/L (ref 22–32)
Calcium: 9.2 mg/dL (ref 8.9–10.3)
Chloride: 95 mmol/L — ABNORMAL LOW (ref 98–111)
Creatinine, Ser: 1 mg/dL (ref 0.61–1.24)
GFR, Estimated: >60 mL/min (ref >60)
Glucose, Bld: 108 mg/dL — ABNORMAL HIGH (ref 70–99)
Potassium: 3.1 mmol/L — ABNORMAL LOW (ref 3.5–5.1)
Sodium: 127 mmol/L — ABNORMAL LOW (ref 135–145)

## 2023-07-31 LAB — TROPONIN I (HIGH SENSITIVITY): Troponin I (High Sensitivity): 10 ng/L (ref <18)

## 2023-07-31 MED ORDER — SODIUM CHLORIDE 0.9 % IV BOLUS
1000.0000 mL | Freq: Once | INTRAVENOUS | Status: AC
  Administered 2023-07-31: 1000 mL via INTRAVENOUS

## 2023-07-31 MED ORDER — IOHEXOL 350 MG/ML SOLN
100.0000 mL | Freq: Once | INTRAVENOUS | Status: AC | PRN
  Administered 2023-07-31: 100 mL via INTRAVENOUS

## 2023-07-31 MED ORDER — SODIUM CHLORIDE (PF) 0.9 % IJ SOLN
INTRAMUSCULAR | Status: AC
  Filled 2023-07-31: qty 50

## 2023-07-31 MED ORDER — MORPHINE SULFATE (PF) 4 MG/ML IV SOLN
4.0000 mg | Freq: Once | INTRAVENOUS | Status: AC
  Administered 2023-07-31: 4 mg via INTRAVENOUS
  Filled 2023-07-31: qty 1

## 2023-07-31 MED ORDER — ALUM & MAG HYDROXIDE-SIMETH 200-200-20 MG/5ML PO SUSP
30.0000 mL | Freq: Once | ORAL | Status: AC
  Administered 2023-07-31: 30 mL via ORAL
  Filled 2023-07-31: qty 30

## 2023-07-31 MED ORDER — ONDANSETRON HCL 4 MG/2ML IJ SOLN
4.0000 mg | Freq: Once | INTRAMUSCULAR | Status: AC
  Administered 2023-07-31: 4 mg via INTRAVENOUS
  Filled 2023-07-31: qty 2

## 2023-07-31 NOTE — Discharge Instructions (Signed)
 Try pepcid or tagamet up to twice a day.  Try to avoid things that may make this worse, most commonly these are spicy foods tomato based products fatty foods chocolate and peppermint.  Alcohol and tobacco can also make this worse.  Return to the emergency department for sudden worsening pain fever or inability to eat or drink.  Follow up with your PCP and cardiologist in the office.

## 2023-07-31 NOTE — ED Provider Notes (Addendum)
 [place] EMERGENCY DEPARTMENT AT [facility] Provider Note   CSN: [id number] Arrival date & time: 07/31/23  1308     History  Chief Complaint  Patient presents with   Chest Pain    Darren [name] is a 53 y.o. male.  53 yo M with a chief complaints of chest pain.  This been going on for a couple days.  This trouble describing it.  Feels it sometimes goes into his arm.  Some difficulty breathing at times.  Denies cough congestion or fever.  Denies trauma.  Denies abdominal pain nausea vomiting.  He has a history of an aortic aneurysm.  He is worried that likely the cause of his symptoms.   Chest Pain      Home Medications Prior to Admission medications   Medication Sig Start Date End Date Taking? Authorizing Provider  amLODipine  (NORVASC ) 5 MG tablet Take 1 tablet (5 mg total) by mouth daily. 04/19/23   Revankar, Micael Adas, MD  aspirin  EC 81 MG tablet Take 81 mg by mouth daily.    [provider]  hydrochlorothiazide  (HYDRODIURIL ) 25 MG tablet Take 1 tablet (25 mg total) by mouth daily. 04/19/23   Revankar, Micael Adas, MD  mirtazapine  (REMERON ) 15 MG tablet Take 15 mg by mouth at bedtime.    [provider]  nitroGLYCERIN  (NITROSTAT ) 0.4 MG SL tablet Place 1 tablet (0.4 mg total) under the tongue every 5 (five) minutes as needed for chest pain. Patient not taking: Reported on 05/04/2023 09/01/17   Stallings, Zoe A, MD  ondansetron  (ZOFRAN ) 4 MG tablet Take 1 tablet (4 mg total) by mouth every 8 (eight) hours as needed. 05/11/23   Janeth Medicus, MD  oxyCODONE  (OXY IR/ROXICODONE ) 5 MG immediate release tablet Take 5 mg by mouth every 6 (six) hours as needed for severe pain (pain score 7-10). 04/30/23   [provider]  oxyCODONE  (ROXICODONE ) 5 MG immediate release tablet Take 1 tablet (5 mg total) by mouth every 4 (four) hours as needed for moderate pain (pain score 4-6), severe pain (pain score 7-10) or breakthrough pain. 05/11/23 05/10/24  Janeth Medicus, MD  sertraline  (ZOLOFT ) 100 MG tablet Take 200 mg by mouth daily.    [provider]      Allergies    Hydrocodone    Review of Systems   Review of Systems  Cardiovascular:  Positive for chest pain.    Physical Exam Updated Vital Signs BP (!) 151/90 (BP Location: Right Arm)   Pulse 90   Temp 98.8 F (37.1 C) (Oral)   Resp 17   SpO2 98%  Physical Exam Vitals and nursing note reviewed.  Constitutional:      Appearance: He is well-developed.  HENT:     Head: Normocephalic and atraumatic.  Eyes:     Pupils: Pupils are equal, round, and reactive to light.  Neck:     Vascular: No JVD.  Cardiovascular:     Rate and Rhythm: Normal rate and regular rhythm.     Heart sounds: No murmur heard.    No friction rub. No gallop.  Pulmonary:     Effort: No respiratory distress.     Breath sounds: No wheezing.  Abdominal:     General: There is no distension.     Tenderness: There is no abdominal tenderness. There is no guarding or rebound.  Musculoskeletal:        General: Normal range of motion.     Cervical back:  Normal range of motion and neck supple.  Skin:    Coloration: Skin is not pale.     Findings: No rash.  Neurological:     Mental Status: He is alert and oriented to person, place, and time.  Psychiatric:        Behavior: Behavior normal.     ED Results / Procedures / Treatments   Labs (all labs ordered are listed, but only abnormal results are displayed) Labs Reviewed  BASIC METABOLIC PANEL WITH GFR - Abnormal; Notable for the following components:      Result Value   Sodium 127 (*)    Potassium 3.1 (*)    Chloride 95 (*)    Glucose, Bld 108 (*)    All other components within normal limits  CBC - Abnormal; Notable for the following components:   RDW 15.7 (*)    All other components within normal limits  TROPONIN I (HIGH SENSITIVITY)  TROPONIN I (HIGH SENSITIVITY)    EKG EKG Interpretation Date/Time:  Tuesday July 31 2023  13:18:06 EDT Ventricular Rate:  93 PR Interval:  190 QRS Duration:  101 QT Interval:  333 QTC Calculation: 415 R Axis:   52  Text Interpretation: Sinus rhythm Left atrial enlargement LVH with secondary repolarization abnormality Baseline wander in lead(s) II III aVF Since last tracing rate faster Otherwise no significant change Confirmed by Albertus Hughs [id number]) on 07/31/2023 2:36:18 PM  Radiology DG Chest 2 View Result Date: 07/31/2023 CLINICAL DATA:  Chest pain. EXAM: CHEST - 2 VIEW COMPARISON:  November 08, 2021. FINDINGS: The heart size and mediastinal contours are within normal limits. Both lungs are clear. The visualized skeletal structures are unremarkable. IMPRESSION: No active cardiopulmonary disease. Electronically Signed   By: Rosalene Colon M.D.   On: 07/31/2023 15:29    Procedures Procedures    Medications Ordered in ED Medications  alum & mag hydroxide-simeth (MAALOX/MYLANTA) 200-200-20 MG/5ML suspension 30 mL (30 mLs Oral Given 07/31/23 1454)  sodium chloride  0.9 % bolus 1,000 mL (1,000 mLs Intravenous New Bag/Given 07/31/23 1453)  morphine  (PF) 4 MG/ML injection 4 mg (4 mg Intravenous Given 07/31/23 1453)  ondansetron  (ZOFRAN ) injection 4 mg (4 mg Intravenous Given 07/31/23 1453)  iohexol (OMNIPAQUE) 350 MG/ML injection 100 mL (100 mLs Intravenous Contrast Given 07/31/23 1516)    ED Course/ Medical Decision Making/ A&P                                 Medical Decision Making Amount and/or Complexity of Data Reviewed Labs: ordered. Radiology: ordered.  Risk OTC drugs. Prescription drug management.   53 yo M with a chief complaints of chest pain.  This is been going on since yesterday.  Vague nothing seems to make it better or worse.  He does have a history of ascending aortic aneurysm.  Has been getting surveillance of this.  Will obtain CT imaging.  Troponin negative.  No anemia, patient with hyponatremia hypochloremia likely secondary to dehydration.  Will give a bolus  of IV fluids. Reassess.   Awaiting CT.    CT scan is resulted without change to the patient's known thoracic aortic aneurysm.  I discussed the results with the patient and family.  Will discharge home.  PCP and cardiology follow-up.  The patients results and plan were reviewed and discussed.   Any x-rays performed were independently reviewed by myself.   Differential diagnosis were considered with the presenting HPI.  Medications  alum & mag hydroxide-simeth (MAALOX/MYLANTA) 200-200-20 MG/5ML suspension 30 mL (30 mLs Oral Given 07/31/23 1454)  sodium chloride  0.9 % bolus 1,000 mL (1,000 mLs Intravenous New Bag/Given 07/31/23 1453)  morphine  (PF) 4 MG/ML injection 4 mg (4 mg Intravenous Given 07/31/23 1453)  ondansetron  (ZOFRAN ) injection 4 mg (4 mg Intravenous Given 07/31/23 1453)  iohexol (OMNIPAQUE) 350 MG/ML injection 100 mL (100 mLs Intravenous Contrast Given 07/31/23 1516)    Vitals:   07/31/23 1322 07/31/23 1330  BP: (!) 165/97 (!) 151/90  Pulse: 90   Resp: 17   Temp: 98.8 F (37.1 C)   TempSrc: Oral   SpO2: 98%     Final diagnoses:  Nonspecific chest pain    Admission/ observation were discussed with the admitting physician, patient and/or family and they are comfortable with the plan.          Final Clinical Impression(s) / ED Diagnoses Final diagnoses:  Nonspecific chest pain    Rx / DC Orders ED Discharge Orders     None           Albertus Hughs, DO 07/31/23 1632

## 2023-07-31 NOTE — ED Triage Notes (Addendum)
 Pt arrives via POV. PT reports he began experiencing intermittent cp yesterday. States it has been a constant stabbing pain today. He reports associated sob, nausea and feeling lightheaded. PT is AxOx4. Pt reports taking two 81mg  of aspirin  today.  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

## 2023-08-21 ENCOUNTER — Ambulatory Visit (INDEPENDENT_AMBULATORY_CARE_PROVIDER_SITE_OTHER): Admitting: Family

## 2023-08-21 ENCOUNTER — Encounter: Payer: Self-pay | Admitting: Family

## 2023-08-21 VITALS — BP 161/85 | HR 73 | Temp 97.9°F | Resp 16 | Ht 67.0 in | Wt 164.8 lb

## 2023-08-21 DIAGNOSIS — G4700 Insomnia, unspecified: Secondary | ICD-10-CM | POA: Diagnosis not present

## 2023-08-21 DIAGNOSIS — I10 Essential (primary) hypertension: Secondary | ICD-10-CM

## 2023-08-21 DIAGNOSIS — R079 Chest pain, unspecified: Secondary | ICD-10-CM | POA: Diagnosis not present

## 2023-08-21 DIAGNOSIS — Z1211 Encounter for screening for malignant neoplasm of colon: Secondary | ICD-10-CM

## 2023-08-21 DIAGNOSIS — Z125 Encounter for screening for malignant neoplasm of prostate: Secondary | ICD-10-CM

## 2023-08-21 DIAGNOSIS — I719 Aortic aneurysm of unspecified site, without rupture: Secondary | ICD-10-CM | POA: Diagnosis not present

## 2023-08-21 MED ORDER — HYDROCHLOROTHIAZIDE 50 MG PO TABS: 50.0000 mg | ORAL_TABLET | Freq: Every day | ORAL | 0 refills | Status: DC

## 2023-08-21 MED ORDER — AMLODIPINE BESYLATE 5 MG PO TABS: 5.0000 mg | ORAL_TABLET | Freq: Every day | ORAL | 0 refills | Status: DC

## 2023-08-21 MED ORDER — MIRTAZAPINE 15 MG PO TABS: 15.0000 mg | ORAL_TABLET | Freq: Every day | ORAL | 0 refills | Status: DC

## 2023-08-21 NOTE — Progress Notes (Signed)
 Check up and medication refill, needs colonoscopy and check his prostate

## 2023-08-21 NOTE — Progress Notes (Signed)
 Patient ID: Darren [name], male    DOB: Mar 30, 1970  MRN: [id number]  CC: Emergency Department Follow-Up  Subjective: Darren [name] is a 53 y.o. male who presents for Emergency Department follow-up. He is accompanied by his significant other.  His concerns today include:  07/31/2023 [facility] Emergency Department [facility] per DO note:    ED Course/ Medical Decision Making/ A&P                               Medical Decision Making Amount and/or Complexity of Data Reviewed Labs: ordered. Radiology: ordered.   Risk OTC drugs. Prescription drug management.     53 yo M with a chief complaints of chest pain.  This is been going on since yesterday.  Vague nothing seems to make it better or worse.   He does have a history of ascending aortic aneurysm.  Has been getting surveillance of this.  Will obtain CT imaging.   Troponin negative.  No anemia, patient with hyponatremia hypochloremia likely secondary to dehydration.  Will give a bolus of IV fluids. Reassess.    Awaiting CT.       CT scan is resulted without change to the patient's known thoracic aortic aneurysm.   I discussed the results with the patient and family.  Will discharge home.  PCP and cardiology follow-up.   The patients results and plan were reviewed and discussed.   Any x-rays performed were independently reviewed by myself.    Differential diagnosis were considered with the presenting HPI.  Today's office visit 08/21/2023: - States feeling improved since Emergency Department discharge. Denies red flag symptoms. States he is no longer having chest pain. Doing well on medication regimen, no issues/concerns.  - Doing well on Amlodipine  and Hydrochlorothiazide , no issues/concerns. He does not complain of red flag symptoms such as but not limited to chest pain, shortness of breath, worst headache of life, nausea/vomiting.  - Doing well on Mirtazapine  for insomnia, no issues/concerns.  - PSA  screening. Denies red flag symptoms.  - Colon cancer screening. Denies red flag symptoms.    Patient Active Problem List   Diagnosis Date Noted   History of coronary artery stent placement 04/19/2023   Anxiety    Preop cardiovascular exam    Prediabetes 06/28/2021   Major depressive disorder, recurrent episode, moderate (HCC) 09/01/2017   Dyslipidemia 02/01/2017   Coronary artery disease involving native heart 02/01/2017   Chest pain in adult 02/01/2017   Hypertension 02/01/2017   Pulmonary emphysema (HCC) 03/24/2016   Angina pectoris (HCC) 11/11/2015   Aortic insufficiency    MDD (major depressive disorder) 08/06/2014   Depression    Coronary artery disease due to lipid rich plaque    Coronary artery disease 03/24/2014   Essential hypertension 03/04/2014     Current Outpatient Medications on File Prior to Visit  Medication Sig Dispense Refill   aspirin  EC 81 MG tablet Take 81 mg by mouth daily.     ondansetron  (ZOFRAN ) 4 MG tablet Take 1 tablet (4 mg total) by mouth every 8 (eight) hours as needed. 20 tablet 0   sertraline  (ZOLOFT ) 100 MG tablet Take 200 mg by mouth daily.     nitroGLYCERIN  (NITROSTAT ) 0.4 MG SL tablet Place 1 tablet (0.4 mg total) under the tongue every 5 (five) minutes as needed for chest pain. (Patient not taking: Reported on 05/04/2023) 30 tablet 0   oxyCODONE  (OXY IR/ROXICODONE )  5 MG immediate release tablet Take 5 mg by mouth every 6 (six) hours as needed for severe pain (pain score 7-10).     oxyCODONE  (ROXICODONE ) 5 MG immediate release tablet Take 1 tablet (5 mg total) by mouth every 4 (four) hours as needed for moderate pain (pain score 4-6), severe pain (pain score 7-10) or breakthrough pain. 25 tablet 0   No current facility-administered medications on file prior to visit.    Allergies  Allergen Reactions   Hydrocodone Hives    Social History   Socioeconomic History   Marital status: Divorced    Spouse name: Not on file   Number of children:  Not on file   Years of education: Not on file   Highest education level: Not on file  Occupational History   Not on file  Tobacco Use   Smoking status: Former    Current packs/day: 0.00    Average packs/day: 0.5 packs/day for 30.0 years (15.0 ttl pk-yrs)    Types: Cigarettes    Start date: 12/28/1985    Quit date: 12/29/2015    Years since quitting: 7.6    Passive exposure: Past   Smokeless tobacco: Never  Vaping Use   Vaping status: Former  Substance and Sexual Activity   Alcohol use: Yes    Alcohol/week: 2.0 standard drinks of alcohol    Types: 2 Standard drinks or equivalent per week    Comment: socially   Drug use: Yes    Types: Marijuana    Comment: occassionally   Sexual activity: Not Currently    Birth control/protection: Condom  Other Topics Concern   Not on file  Social History Narrative   Not on file   Social Drivers of Health   Financial Resource Strain: Low Risk  (08/21/2023)   Overall Financial Resource Strain (CARDIA)    Difficulty of Paying Living Expenses: Not hard at all  Food Insecurity: No Food Insecurity (08/21/2023)   Hunger Vital Sign    Worried About Running Out of Food in the Last Year: Never true    Ran Out of Food in the Last Year: Never true  Transportation Needs: No Transportation Needs (08/21/2023)   PRAPARE - Administrator, Civil Service (Medical): No    Lack of Transportation (Non-Medical): No  Physical Activity: Inactive (08/21/2023)   Exercise Vital Sign    Days of Exercise per Week: 0 days    Minutes of Exercise per Session: 0 min  Stress: No Stress Concern Present (08/21/2023)   [employer] of Occupational Health - Occupational Stress Questionnaire    Feeling of Stress: Not at all  Social Connections: Moderately Integrated (08/21/2023)   Social Connection and Isolation Panel    Frequency of Communication with Friends and Family: More than three times a week    Frequency of Social Gatherings with Friends and Family:  More than three times a week    Attends Religious Services: More than 4 times per year    Active Member of [employer] or Organizations: No    Attends Banker Meetings: Never    Marital Status: Married  Catering manager Violence: Not At Risk (08/21/2023)   Humiliation, Afraid, Rape, and Kick questionnaire    Fear of Current or Ex-Partner: No    Emotionally Abused: No    Physically Abused: No    Sexually Abused: No    Family History  Problem Relation Age of Onset   Hypertension Mother    Hypertension Father  Alcohol abuse Father    Hypertension Other    Diabetes Other    Heart attack Other     Past Surgical History:  Procedure Laterality Date   CARDIAC CATHETERIZATION  03/04/2014   PCI of mid LAD   CARDIAC CATHETERIZATION N/A 11/12/2015   Procedure: Left Heart Cath and Coronary Angiography;  Surgeon: Ozell Fell, MD;  Location: [facility] INVASIVE CV LAB;  Service: Cardiovascular;  Laterality: N/A;   LEFT HEART CATHETERIZATION WITH CORONARY ANGIOGRAM Bilateral 03/04/2014   Procedure: LEFT HEART CATHETERIZATION WITH CORONARY ANGIOGRAM;  Surgeon: Lonni JONETTA Cash, MD;  Location: [facility] CATH LAB;  Service: Cardiovascular;  Laterality: Bilateral;   LEFT HEART CATHETERIZATION WITH CORONARY ANGIOGRAM N/A 03/26/2014   Procedure: LEFT HEART CATHETERIZATION WITH CORONARY ANGIOGRAM;  Surgeon: Debby DELENA Sor, MD;  Location: [facility] CATH LAB;  Service: Cardiovascular;  Laterality: N/A;   ORIF ANKLE FRACTURE Left 05/11/2023   Procedure: OPEN REDUCTION INTERNAL FIXATION (ORIF) ANKLE FRACTURE;  Surgeon: Sharl Selinda Dover, MD;  Location: [facility] ORS;  Service: Orthopedics;  Laterality: Left;    ROS: Review of Systems Negative except as stated above  PHYSICAL EXAM: BP (!) 161/85   Pulse 73   Temp 97.9 F (36.6 C) (Oral)   Resp 16   Ht 5' 7 (1.702 m)   Wt 164 lb 12.8 oz (74.8 kg)   SpO2 97%   BMI 25.81 kg/m   Physical Exam HENT:     Head: Normocephalic and atraumatic.     Nose: Nose  normal.     Mouth/Throat:     Mouth: Mucous membranes are moist.     Pharynx: Oropharynx is clear.   Eyes:     Extraocular Movements: Extraocular movements intact.     Conjunctiva/sclera: Conjunctivae normal.     Pupils: Pupils are equal, round, and reactive to light.    Cardiovascular:     Rate and Rhythm: Normal rate and regular rhythm.     Pulses: Normal pulses.     Heart sounds: Normal heart sounds.  Pulmonary:     Effort: Pulmonary effort is normal.     Breath sounds: Normal breath sounds.   Musculoskeletal:        General: Normal range of motion.     Cervical back: Normal range of motion and neck supple.   Neurological:     General: No focal deficit present.     Mental Status: He is alert and oriented to person, place, and time.   Psychiatric:        Mood and Affect: Mood normal.        Behavior: Behavior normal.     ASSESSMENT AND PLAN: 1. Nonspecific chest pain (Primary) - Patient today in office with no cardiopulmonary/acute distress.  - Patient states he no longer has chest pain since Emergency Department discharge.  - Referral to Cardiology for evaluation/management. - Ambulatory referral to Cardiology  2. Primary hypertension - Blood pressure not at goal during today's visit. Patient asymptomatic without chest pressure, chest pain, palpitations, shortness of breath, worst headache of life, and any additional red flag symptoms. - Continue Amlodipine  as prescribed.  - Increase Hydrochlorothiazide  from 25 mg to 50 mg as prescribed.  - Counseled on blood pressure goal of less than 130/80, low-sodium, DASH diet, medication compliance, and 150 minutes of moderate intensity exercise per week as tolerated. Counseled on medication adherence and adverse effects. - Referral to Cardiology for evaluation/management.  - Follow-up with primary provider in 4 weeks or sooner if needed.  - amLODipine  (  NORVASC ) 5 MG tablet; Take 1 tablet (5 mg total) by mouth daily.  Dispense:  90 tablet; Refill: 0 - hydrochlorothiazide  (HYDRODIURIL ) 50 MG tablet; Take 1 tablet (50 mg total) by mouth daily.  Dispense: 90 tablet; Refill: 0 - Ambulatory referral to Cardiology  3. Aortic aneurysm, unspecified portion of aorta, unspecified whether ruptured [facility]) - Referral to Cardiology for evaluation/management. - Ambulatory referral to Cardiology  4. Insomnia, unspecified type - Continue Mirtazapine  as prescribed. Counseled on medication adherence/adverse effects.  - Follow-up with primary provider in 3 months or sooner if needed.  - mirtazapine  (REMERON ) 15 MG tablet; Take 1 tablet (15 mg total) by mouth at bedtime.  Dispense: 90 tablet; Refill: 0  5. Colon cancer screening - Referral to Gastroenterology for colon cancer screening by colonoscopy.  - Ambulatory referral to Gastroenterology  6. Screening PSA (prostate specific antigen) - Routine screening.  - PSA   Patient was given the opportunity to ask questions.  Patient verbalized understanding of the plan and was able to repeat key elements of the plan. Patient was given clear instructions to go to Emergency Department or return to medical center if symptoms don't improve, worsen, or new problems develop.The patient verbalized understanding.   Orders Placed This Encounter  Procedures   PSA   Ambulatory referral to Gastroenterology   Ambulatory referral to Cardiology     Requested Prescriptions   Signed Prescriptions Disp Refills   amLODipine  (NORVASC ) 5 MG tablet 90 tablet 0    Sig: Take 1 tablet (5 mg total) by mouth daily.   hydrochlorothiazide  (HYDRODIURIL ) 50 MG tablet 90 tablet 0    Sig: Take 1 tablet (50 mg total) by mouth daily.   mirtazapine  (REMERON ) 15 MG tablet 90 tablet 0    Sig: Take 1 tablet (15 mg total) by mouth at bedtime.    Follow-up with primary provider as scheduled.  Greig JINNY Drones, NP

## 2023-08-22 ENCOUNTER — Other Ambulatory Visit: Payer: Self-pay | Admitting: Family

## 2023-08-22 ENCOUNTER — Ambulatory Visit: Payer: Self-pay | Admitting: Family

## 2023-08-22 DIAGNOSIS — F419 Anxiety disorder, unspecified: Secondary | ICD-10-CM

## 2023-08-22 LAB — PSA: Prostate Specific Ag, Serum: 1 ng/mL (ref 0.0–4.0)

## 2023-08-22 MED ORDER — SERTRALINE HCL 100 MG PO TABS
200.0000 mg | ORAL_TABLET | Freq: Every day | ORAL | 2 refills | Status: AC
Start: 2023-08-22 — End: 2023-11-20

## 2023-09-04 ENCOUNTER — Ambulatory Visit (INDEPENDENT_AMBULATORY_CARE_PROVIDER_SITE_OTHER): Admitting: Family

## 2023-09-04 ENCOUNTER — Encounter: Payer: Self-pay | Admitting: Family

## 2023-09-04 VITALS — BP 135/81 | HR 84 | Temp 98.4°F | Resp 16 | Ht 67.0 in | Wt 165.6 lb

## 2023-09-04 DIAGNOSIS — I719 Aortic aneurysm of unspecified site, without rupture: Secondary | ICD-10-CM

## 2023-09-04 DIAGNOSIS — I10 Essential (primary) hypertension: Secondary | ICD-10-CM

## 2023-09-04 DIAGNOSIS — R079 Chest pain, unspecified: Secondary | ICD-10-CM | POA: Diagnosis not present

## 2023-09-04 DIAGNOSIS — Z1211 Encounter for screening for malignant neoplasm of colon: Secondary | ICD-10-CM

## 2023-09-04 MED ORDER — HYDROCHLOROTHIAZIDE 50 MG PO TABS: 50.0000 mg | ORAL_TABLET | Freq: Every day | ORAL | 0 refills | Status: DC

## 2023-09-04 MED ORDER — AMLODIPINE BESYLATE 5 MG PO TABS: 5.0000 mg | ORAL_TABLET | Freq: Every day | ORAL | 0 refills | Status: DC

## 2023-09-04 NOTE — Progress Notes (Signed)
 2 week follow up

## 2023-09-04 NOTE — Progress Notes (Signed)
 Patient ID: Darren [name], male    DOB: [birth year]  MRN: [id number]  CC: Chronic Conditions Follow-Up  Subjective: Darren [name] is a 53 y.o. male who presents for chronic conditions follow-up. He is accompanied by his wife.  His concerns today include:  - Doing well on Amlodipine  and Hydrochlorothiazide , no issues/concerns. He does not complain of red flag symptoms such as but not limited to chest pain, shortness of breath, worst headache of life, nausea/vomiting. States he never received call from Cardiology to schedule appointment. - States he never received call from Gastroenterology to schedule appointment.  Patient Active Problem List   Diagnosis Date Noted   History of coronary artery stent placement 04/19/2023   Anxiety    Preop cardiovascular exam    Prediabetes 06/28/2021   Major depressive disorder, recurrent episode, moderate (HCC) 09/01/2017   Dyslipidemia 02/01/2017   Coronary artery disease involving native heart 02/01/2017   Chest pain in adult 02/01/2017   Hypertension 02/01/2017   Pulmonary emphysema (HCC) 03/24/2016   Angina pectoris (HCC) 11/11/2015   Aortic insufficiency    MDD (major depressive disorder) 08/06/2014   Depression    Coronary artery disease due to lipid rich plaque    Coronary artery disease 03/24/2014   Essential hypertension 03/04/2014     Current Outpatient Medications on File Prior to Visit  Medication Sig Dispense Refill   aspirin  EC 81 MG tablet Take 81 mg by mouth daily.     mirtazapine  (REMERON ) 15 MG tablet Take 1 tablet (15 mg total) by mouth at bedtime. 90 tablet 0   sertraline  (ZOLOFT ) 100 MG tablet Take 2 tablets (200 mg total) by mouth daily. 60 tablet 2   nitroGLYCERIN  (NITROSTAT ) 0.4 MG SL tablet Place 1 tablet (0.4 mg total) under the tongue every 5 (five) minutes as needed for chest pain. (Patient not taking: Reported on 05/04/2023) 30 tablet 0   ondansetron  (ZOFRAN ) 4 MG tablet Take 1 tablet (4 mg total) by mouth every 8  (eight) hours as needed. 20 tablet 0   oxyCODONE  (OXY IR/ROXICODONE ) 5 MG immediate release tablet Take 5 mg by mouth every 6 (six) hours as needed for severe pain (pain score 7-10).     oxyCODONE  (ROXICODONE ) 5 MG immediate release tablet Take 1 tablet (5 mg total) by mouth every 4 (four) hours as needed for moderate pain (pain score 4-6), severe pain (pain score 7-10) or breakthrough pain. 25 tablet 0   No current facility-administered medications on file prior to visit.    Allergies  Allergen Reactions   Hydrocodone Hives    Social History   Socioeconomic History   Marital status: Divorced    Spouse name: Not on file   Number of children: Not on file   Years of education: Not on file   Highest education level: 11th grade  Occupational History   Not on file  Tobacco Use   Smoking status: Former    Current packs/day: 0.00    Average packs/day: 0.5 packs/day for 30.0 years (15.0 ttl pk-yrs)    Types: Cigarettes    Start date: 12/28/1985    Quit date: 12/29/2015    Years since quitting: 7.6    Passive exposure: Past   Smokeless tobacco: Never  Vaping Use   Vaping status: Former  Substance and Sexual Activity   Alcohol use: Yes    Alcohol/week: 2.0 standard drinks of alcohol    Types: 2 Standard drinks or equivalent per week    Comment: socially  Drug use: Yes    Types: Marijuana    Comment: occassionally   Sexual activity: Not Currently    Birth control/protection: Condom  Other Topics Concern   Not on file  Social History Narrative   Not on file   Social Drivers of Health   Financial Resource Strain: Low Risk  (08/31/2023)   Overall Financial Resource Strain (CARDIA)    Difficulty of Paying Living Expenses: Not very hard  Food Insecurity: No Food Insecurity (08/31/2023)   Hunger Vital Sign    Worried About Running Out of Food in the Last Year: Never true    Ran Out of Food in the Last Year: Never true  Transportation Needs: No Transportation Needs (08/31/2023)    PRAPARE - Administrator, Civil Service (Medical): No    Lack of Transportation (Non-Medical): No  Physical Activity: Unknown (08/31/2023)   Exercise Vital Sign    Days of Exercise per Week: Patient declined    Minutes of Exercise per Session: Not on file  Recent Concern: Physical Activity - Inactive (08/21/2023)   Exercise Vital Sign    Days of Exercise per Week: 0 days    Minutes of Exercise per Session: 0 min  Stress: Stress Concern Present (08/31/2023)   [employer] of Occupational Health - Occupational Stress Questionnaire    Feeling of Stress: To some extent  Social Connections: Moderately Isolated (08/31/2023)   Social Connection and Isolation Panel    Frequency of Communication with Friends and Family: More than three times a week    Frequency of Social Gatherings with Friends and Family: Patient declined    Attends Religious Services: Never    Database administrator or Organizations: No    Attends Engineer, structural: Not on file    Marital Status: Married  Catering manager Violence: Not At Risk (08/21/2023)   Humiliation, Afraid, Rape, and Kick questionnaire    Fear of Current or Ex-Partner: No    Emotionally Abused: No    Physically Abused: No    Sexually Abused: No    Family History  Problem Relation Age of Onset   Hypertension Mother    Hypertension Father    Alcohol abuse Father    Hypertension Other    Diabetes Other    Heart attack Other     Past Surgical History:  Procedure Laterality Date   CARDIAC CATHETERIZATION  03/04/2014   PCI of mid LAD   CARDIAC CATHETERIZATION N/A 11/12/2015   Procedure: Left Heart Cath and Coronary Angiography;  Surgeon: Ozell Fell, MD;  Location: [facility] INVASIVE CV LAB;  Service: Cardiovascular;  Laterality: N/A;   LEFT HEART CATHETERIZATION WITH CORONARY ANGIOGRAM Bilateral 03/04/2014   Procedure: LEFT HEART CATHETERIZATION WITH CORONARY ANGIOGRAM;  Surgeon: Lonni JONETTA Cash, MD;  Location: [facility] CATH LAB;   Service: Cardiovascular;  Laterality: Bilateral;   LEFT HEART CATHETERIZATION WITH CORONARY ANGIOGRAM N/A 03/26/2014   Procedure: LEFT HEART CATHETERIZATION WITH CORONARY ANGIOGRAM;  Surgeon: Debby DELENA Sor, MD;  Location: [facility] CATH LAB;  Service: Cardiovascular;  Laterality: N/A;   ORIF ANKLE FRACTURE Left 05/11/2023   Procedure: OPEN REDUCTION INTERNAL FIXATION (ORIF) ANKLE FRACTURE;  Surgeon: Sharl Selinda Dover, MD;  Location: [facility] ORS;  Service: Orthopedics;  Laterality: Left;    ROS: Review of Systems Negative except as stated above  PHYSICAL EXAM: BP 135/81   Pulse 84   Temp 98.4 F (36.9 C) (Oral)   Resp 16   Ht 5' 7 (1.702 m)   Wt  165 lb 9.6 oz (75.1 kg)   SpO2 98%   BMI 25.94 kg/m   Physical Exam HENT:     Head: Normocephalic and atraumatic.     Nose: Nose normal.     Mouth/Throat:     Mouth: Mucous membranes are moist.     Pharynx: Oropharynx is clear.  Eyes:     Extraocular Movements: Extraocular movements intact.     Conjunctiva/sclera: Conjunctivae normal.     Pupils: Pupils are equal, round, and reactive to light.  Cardiovascular:     Rate and Rhythm: Normal rate and regular rhythm.     Pulses: Normal pulses.     Heart sounds: Normal heart sounds.  Pulmonary:     Effort: Pulmonary effort is normal.     Breath sounds: Normal breath sounds.  Musculoskeletal:        General: Normal range of motion.     Cervical back: Normal range of motion and neck supple.  Neurological:     General: No focal deficit present.     Mental Status: He is alert and oriented to person, place, and time.  Psychiatric:        Mood and Affect: Mood normal.        Behavior: Behavior normal.     ASSESSMENT AND PLAN: 1. Primary hypertension (Primary) - Continue Amlodipine  and Hydrochlorothiazide  as prescribed.  - Counseled on blood pressure goal of less than 130/80, low-sodium, DASH diet, medication compliance, and 150 minutes of moderate intensity exercise per week as tolerated.  Counseled on medication adherence and adverse effects. - Referral to Cardiology for evaluation/management.  - Follow-up with primary provider in 3 months or sooner if needed.  - amLODipine  (NORVASC ) 5 MG tablet; Take 1 tablet (5 mg total) by mouth daily.  Dispense: 90 tablet; Refill: 0 - hydrochlorothiazide  (HYDRODIURIL ) 50 MG tablet; Take 1 tablet (50 mg total) by mouth daily.  Dispense: 90 tablet; Refill: 0 - Ambulatory referral to Cardiology  2. Nonspecific chest pain 3. Aortic aneurysm, unspecified portion of aorta, unspecified whether ruptured [facility]) - Patient today in office with no cardiopulmonary/acute distress.  - Referral to Cardiology for evaluation/management. - Ambulatory referral to Cardiology  4. Colon cancer screening - Referral to Gastroenterology for colon cancer screening by colonoscopy. - Ambulatory referral to Gastroenterology   Patient was given the opportunity to ask questions.  Patient verbalized understanding of the plan and was able to repeat key elements of the plan. Patient was given clear instructions to go to Emergency Department or return to medical center if symptoms don't improve, worsen, or new problems develop.The patient verbalized understanding.   Orders Placed This Encounter  Procedures   Ambulatory referral to Cardiology   Ambulatory referral to Gastroenterology     Requested Prescriptions   Signed Prescriptions Disp Refills   amLODipine  (NORVASC ) 5 MG tablet 90 tablet 0    Sig: Take 1 tablet (5 mg total) by mouth daily.   hydrochlorothiazide  (HYDRODIURIL ) 50 MG tablet 90 tablet 0    Sig: Take 1 tablet (50 mg total) by mouth daily.    Follow-up with primary provider as scheduled.  Greig JINNY Drones, NP

## 2023-09-28 ENCOUNTER — Encounter: Payer: Self-pay | Admitting: Family

## 2023-09-28 ENCOUNTER — Ambulatory Visit (INDEPENDENT_AMBULATORY_CARE_PROVIDER_SITE_OTHER): Admitting: Family

## 2023-09-28 VITALS — BP 153/90 | HR 70 | Temp 98.4°F | Resp 16 | Ht 67.0 in | Wt 173.8 lb

## 2023-09-28 DIAGNOSIS — I10 Essential (primary) hypertension: Secondary | ICD-10-CM

## 2023-09-28 DIAGNOSIS — I719 Aortic aneurysm of unspecified site, without rupture: Secondary | ICD-10-CM

## 2023-09-28 DIAGNOSIS — R079 Chest pain, unspecified: Secondary | ICD-10-CM | POA: Diagnosis not present

## 2023-09-28 DIAGNOSIS — G4700 Insomnia, unspecified: Secondary | ICD-10-CM

## 2023-09-28 DIAGNOSIS — Z1211 Encounter for screening for malignant neoplasm of colon: Secondary | ICD-10-CM

## 2023-09-28 MED ORDER — HYDROCHLOROTHIAZIDE 50 MG PO TABS: 50.0000 mg | ORAL_TABLET | Freq: Every day | ORAL | 0 refills | Status: AC

## 2023-09-28 MED ORDER — AMLODIPINE BESYLATE 10 MG PO TABS: 10.0000 mg | ORAL_TABLET | Freq: Every day | ORAL | 0 refills | Status: AC

## 2023-09-28 MED ORDER — MIRTAZAPINE 15 MG PO TABS: 15.0000 mg | ORAL_TABLET | Freq: Every day | ORAL | 0 refills | Status: AC

## 2023-09-28 NOTE — Progress Notes (Signed)
 Patient ID: Darren [name], male    DOB: 25-Dec-1970  MRN: [id number]  CC: Chronic Conditions Follow-Up  Subjective: Darren [name] is a 53 y.o. male who presents for chronic conditions follow-up. He is accompanied by his wife.  His concerns today include:  - Doing well on Amlodipine  and Hydrochlorothiazide , no issues/concerns. He does not complain of red flag symptoms such as but not limited to chest pain, shortness of breath, worst headache of life, nausea/vomiting.  - States he never received call from Cardiology and Gastroenterology.  - Doing well on Mirtazepine, no issues/concerns.  Patient Active Problem List   Diagnosis Date Noted   History of coronary artery stent placement 04/19/2023   Anxiety    Preop cardiovascular exam    Prediabetes 06/28/2021   Major depressive disorder, recurrent episode, moderate (HCC) 09/01/2017   Dyslipidemia 02/01/2017   Coronary artery disease involving native heart 02/01/2017   Chest pain in adult 02/01/2017   Hypertension 02/01/2017   Pulmonary emphysema (HCC) 03/24/2016   Angina pectoris (HCC) 11/11/2015   Aortic insufficiency    MDD (major depressive disorder) 08/06/2014   Depression    Coronary artery disease due to lipid rich plaque    Coronary artery disease 03/24/2014   Essential hypertension 03/04/2014     Current Outpatient Medications on File Prior to Visit  Medication Sig Dispense Refill   aspirin  EC 81 MG tablet Take 81 mg by mouth daily.     sertraline  (ZOLOFT ) 100 MG tablet Take 2 tablets (200 mg total) by mouth daily. 60 tablet 2   nitroGLYCERIN  (NITROSTAT ) 0.4 MG SL tablet Place 1 tablet (0.4 mg total) under the tongue every 5 (five) minutes as needed for chest pain. (Patient not taking: Reported on 05/04/2023) 30 tablet 0   ondansetron  (ZOFRAN ) 4 MG tablet Take 1 tablet (4 mg total) by mouth every 8 (eight) hours as needed. 20 tablet 0   oxyCODONE  (OXY IR/ROXICODONE ) 5 MG immediate release tablet Take 5 mg by mouth every 6  (six) hours as needed for severe pain (pain score 7-10).     oxyCODONE  (ROXICODONE ) 5 MG immediate release tablet Take 1 tablet (5 mg total) by mouth every 4 (four) hours as needed for moderate pain (pain score 4-6), severe pain (pain score 7-10) or breakthrough pain. 25 tablet 0   No current facility-administered medications on file prior to visit.    Allergies  Allergen Reactions   Hydrocodone Hives    Social History   Socioeconomic History   Marital status: Divorced    Spouse name: Not on file   Number of children: Not on file   Years of education: Not on file   Highest education level: 11th grade  Occupational History   Not on file  Tobacco Use   Smoking status: Former    Current packs/day: 0.00    Average packs/day: 0.5 packs/day for 30.0 years (15.0 ttl pk-yrs)    Types: Cigarettes    Start date: 12/28/1985    Quit date: 12/29/2015    Years since quitting: 7.7    Passive exposure: Past   Smokeless tobacco: Never  Vaping Use   Vaping status: Former  Substance and Sexual Activity   Alcohol use: Yes    Alcohol/week: 2.0 standard drinks of alcohol    Types: 2 Standard drinks or equivalent per week    Comment: socially   Drug use: Yes    Types: Marijuana    Comment: occassionally   Sexual activity: Not Currently  Birth control/protection: Condom  Other Topics Concern   Not on file  Social History Narrative   Not on file   Social Drivers of Health   Financial Resource Strain: Low Risk  (08/31/2023)   Overall Financial Resource Strain (CARDIA)    Difficulty of Paying Living Expenses: Not very hard  Food Insecurity: No Food Insecurity (08/31/2023)   Hunger Vital Sign    Worried About Running Out of Food in the Last Year: Never true    Ran Out of Food in the Last Year: Never true  Transportation Needs: No Transportation Needs (08/31/2023)   PRAPARE - Administrator, Civil Service (Medical): No    Lack of Transportation (Non-Medical): No  Physical  Activity: Unknown (08/31/2023)   Exercise Vital Sign    Days of Exercise per Week: Patient declined    Minutes of Exercise per Session: Not on file  Recent Concern: Physical Activity - Inactive (08/21/2023)   Exercise Vital Sign    Days of Exercise per Week: 0 days    Minutes of Exercise per Session: 0 min  Stress: Stress Concern Present (08/31/2023)   [employer] of Occupational Health - Occupational Stress Questionnaire    Feeling of Stress: To some extent  Social Connections: Moderately Isolated (08/31/2023)   Social Connection and Isolation Panel    Frequency of Communication with Friends and Family: More than three times a week    Frequency of Social Gatherings with Friends and Family: Patient declined    Attends Religious Services: Never    Database administrator or Organizations: No    Attends Engineer, structural: Not on file    Marital Status: Married  Catering manager Violence: Not At Risk (08/21/2023)   Humiliation, Afraid, Rape, and Kick questionnaire    Fear of Current or Ex-Partner: No    Emotionally Abused: No    Physically Abused: No    Sexually Abused: No    Family History  Problem Relation Age of Onset   Hypertension Mother    Hypertension Father    Alcohol abuse Father    Hypertension Other    Diabetes Other    Heart attack Other     Past Surgical History:  Procedure Laterality Date   CARDIAC CATHETERIZATION  03/04/2014   PCI of mid LAD   CARDIAC CATHETERIZATION N/A 11/12/2015   Procedure: Left Heart Cath and Coronary Angiography;  Surgeon: Ozell Fell, MD;  Location: [facility] INVASIVE CV LAB;  Service: Cardiovascular;  Laterality: N/A;   LEFT HEART CATHETERIZATION WITH CORONARY ANGIOGRAM Bilateral 03/04/2014   Procedure: LEFT HEART CATHETERIZATION WITH CORONARY ANGIOGRAM;  Surgeon: Lonni JONETTA Cash, MD;  Location: [facility] CATH LAB;  Service: Cardiovascular;  Laterality: Bilateral;   LEFT HEART CATHETERIZATION WITH CORONARY ANGIOGRAM N/A 03/26/2014    Procedure: LEFT HEART CATHETERIZATION WITH CORONARY ANGIOGRAM;  Surgeon: Debby DELENA Sor, MD;  Location: [facility] CATH LAB;  Service: Cardiovascular;  Laterality: N/A;   ORIF ANKLE FRACTURE Left 05/11/2023   Procedure: OPEN REDUCTION INTERNAL FIXATION (ORIF) ANKLE FRACTURE;  Surgeon: Sharl Selinda Dover, MD;  Location: [facility] ORS;  Service: Orthopedics;  Laterality: Left;    ROS: Review of Systems Negative except as stated above  PHYSICAL EXAM: BP (!) 153/90   Pulse 70   Temp 98.4 F (36.9 C) (Oral)   Resp 16   Ht 5' 7 (1.702 m)   Wt 173 lb 12.8 oz (78.8 kg)   SpO2 98%   BMI 27.22 kg/m   Physical Exam HENT:  Head: Normocephalic and atraumatic.     Nose: Nose normal.     Mouth/Throat:     Mouth: Mucous membranes are moist.     Pharynx: Oropharynx is clear.  Eyes:     Extraocular Movements: Extraocular movements intact.     Conjunctiva/sclera: Conjunctivae normal.     Pupils: Pupils are equal, round, and reactive to light.  Cardiovascular:     Rate and Rhythm: Normal rate and regular rhythm.     Pulses: Normal pulses.     Heart sounds: Normal heart sounds.  Pulmonary:     Effort: Pulmonary effort is normal.     Breath sounds: Normal breath sounds.  Musculoskeletal:        General: Normal range of motion.     Cervical back: Normal range of motion and neck supple.  Neurological:     General: No focal deficit present.     Mental Status: He is alert and oriented to person, place, and time.  Psychiatric:        Mood and Affect: Mood normal.        Behavior: Behavior normal.     ASSESSMENT AND PLAN: 1. Primary hypertension (Primary) - Blood pressure not at goal during today's visit. Patient asymptomatic without chest pressure, chest pain, palpitations, shortness of breath, worst headache of life, and any additional red flag symptoms. - Increase Amlodipine  from 5 mg to 10 mg as prescribed. - Continue Hydrochlorothiazide  as prescribed.  - Counseled on blood pressure goal of  less than 130/80, low-sodium, DASH diet, medication compliance, and 150 minutes of moderate intensity exercise per week as tolerated. Counseled on medication adherence and adverse effects. - Referral to Cardiology for evaluation/management. - Follow-up with primary provider as scheduled.  - amLODipine  (NORVASC ) 10 MG tablet; Take 1 tablet (10 mg total) by mouth daily.  Dispense: 90 tablet; Refill: 0 - hydrochlorothiazide  (HYDRODIURIL ) 50 MG tablet; Take 1 tablet (50 mg total) by mouth daily.  Dispense: 90 tablet; Refill: 0 - Ambulatory referral to Cardiology  2. Nonspecific chest pain 3. Aortic aneurysm, unspecified portion of aorta, unspecified whether ruptured [facility]) - Patient today in office with no cardiopulmonary/acute distress.  - Referral to Cardiology for evaluation/management. - Ambulatory referral to Cardiology  4. Insomnia, unspecified type - Continue Mirtazapine  as prescribed. Counseled on medication adherence/adverse effects.  - Follow-up with primary provider in 3 months or sooner if needed. - mirtazapine  (REMERON ) 15 MG tablet; Take 1 tablet (15 mg total) by mouth at bedtime.  Dispense: 90 tablet; Refill: 0  5. Colon cancer screening - Referral to Gastroenterology for colon cancer screening by colonoscopy. - Ambulatory referral to Gastroenterology   Patient was given the opportunity to ask questions.  Patient verbalized understanding of the plan and was able to repeat key elements of the plan. Patient was given clear instructions to go to Emergency Department or return to medical center if symptoms don't improve, worsen, or new problems develop.The patient verbalized understanding.   Orders Placed This Encounter  Procedures   Ambulatory referral to Cardiology   Ambulatory referral to Gastroenterology     Requested Prescriptions   Signed Prescriptions Disp Refills   amLODipine  (NORVASC ) 10 MG tablet 90 tablet 0    Sig: Take 1 tablet (10 mg total) by mouth daily.    hydrochlorothiazide  (HYDRODIURIL ) 50 MG tablet 90 tablet 0    Sig: Take 1 tablet (50 mg total) by mouth daily.   mirtazapine  (REMERON ) 15 MG tablet 90 tablet 0    Sig: Take  1 tablet (15 mg total) by mouth at bedtime.    Follow-up with primary provider as scheduled.  Greig JINNY Drones, NP

## 2023-09-28 NOTE — Progress Notes (Signed)
 Check on b/p to make sure medication is working,  sleeping medication refill

## 2023-10-01 ENCOUNTER — Ambulatory Visit: Admitting: Family

## 2023-10-02 ENCOUNTER — Encounter: Payer: Self-pay | Admitting: Family

## 2023-10-02 NOTE — Telephone Encounter (Signed)
 Mirtazapine  prescribed (09/28/2023  4:09 PM EDT).

## 2023-10-24 ENCOUNTER — Encounter: Payer: Self-pay | Admitting: Family
